# Patient Record
Sex: Male | Born: 1940 | Race: White | Hispanic: No | Marital: Married | Smoking: Former smoker
Health system: Southern US, Community
[De-identification: ages and names within clinical notes are randomized; demographics above are authoritative.]

## PROBLEM LIST (undated history)

## (undated) DIAGNOSIS — E785 Hyperlipidemia, unspecified: Secondary | ICD-10-CM

## (undated) DIAGNOSIS — J189 Pneumonia, unspecified organism: Secondary | ICD-10-CM

## (undated) DIAGNOSIS — I10 Essential (primary) hypertension: Secondary | ICD-10-CM

## (undated) DIAGNOSIS — D369 Benign neoplasm, unspecified site: Secondary | ICD-10-CM

## (undated) DIAGNOSIS — K5790 Diverticulosis of intestine, part unspecified, without perforation or abscess without bleeding: Secondary | ICD-10-CM

## (undated) DIAGNOSIS — I219 Acute myocardial infarction, unspecified: Secondary | ICD-10-CM

## (undated) DIAGNOSIS — K5732 Diverticulitis of large intestine without perforation or abscess without bleeding: Secondary | ICD-10-CM

## (undated) DIAGNOSIS — I5022 Chronic systolic (congestive) heart failure: Secondary | ICD-10-CM

## (undated) DIAGNOSIS — E875 Hyperkalemia: Secondary | ICD-10-CM

## (undated) DIAGNOSIS — J449 Chronic obstructive pulmonary disease, unspecified: Secondary | ICD-10-CM

## (undated) DIAGNOSIS — Z8679 Personal history of other diseases of the circulatory system: Secondary | ICD-10-CM

## (undated) DIAGNOSIS — I2510 Atherosclerotic heart disease of native coronary artery without angina pectoris: Secondary | ICD-10-CM

## (undated) DIAGNOSIS — H9190 Unspecified hearing loss, unspecified ear: Secondary | ICD-10-CM

## (undated) DIAGNOSIS — I482 Chronic atrial fibrillation, unspecified: Secondary | ICD-10-CM

## (undated) DIAGNOSIS — E119 Type 2 diabetes mellitus without complications: Secondary | ICD-10-CM

## (undated) DIAGNOSIS — C61 Malignant neoplasm of prostate: Secondary | ICD-10-CM

## (undated) HISTORY — DX: Chronic obstructive pulmonary disease, unspecified: J44.9

## (undated) HISTORY — DX: Chronic systolic (congestive) heart failure: I50.22

## (undated) HISTORY — DX: Diverticulitis of large intestine without perforation or abscess without bleeding: K57.32

## (undated) HISTORY — DX: Atherosclerotic heart disease of native coronary artery without angina pectoris: I25.10

## (undated) HISTORY — DX: Unspecified hearing loss, unspecified ear: H91.90

## (undated) HISTORY — DX: Benign neoplasm, unspecified site: D36.9

## (undated) HISTORY — PX: EXTERNAL EAR SURGERY: SHX627

## (undated) HISTORY — DX: Essential (primary) hypertension: I10

## (undated) HISTORY — DX: Type 2 diabetes mellitus without complications: E11.9

## (undated) HISTORY — DX: Pneumonia, unspecified organism: J18.9

## (undated) HISTORY — DX: Acute myocardial infarction, unspecified: I21.9

## (undated) HISTORY — DX: Hyperkalemia: E87.5

## (undated) HISTORY — DX: Diverticulosis of intestine, part unspecified, without perforation or abscess without bleeding: K57.90

## (undated) HISTORY — PX: HERNIA REPAIR: SHX51

## (undated) HISTORY — DX: Personal history of other diseases of the circulatory system: Z86.79

## (undated) HISTORY — PX: CATARACT EXTRACTION: SUR2

## (undated) HISTORY — DX: Hyperlipidemia, unspecified: E78.5

## (undated) HISTORY — DX: Chronic atrial fibrillation, unspecified: I48.20

---

## 1987-12-23 HISTORY — PX: CORONARY ARTERY BYPASS GRAFT: SHX141

## 2001-07-09 ENCOUNTER — Inpatient Hospital Stay (HOSPITAL_COMMUNITY): Admission: EM | Admit: 2001-07-09 | Discharge: 2001-07-13 | Payer: Self-pay

## 2001-07-23 ENCOUNTER — Encounter: Payer: Self-pay | Admitting: Internal Medicine

## 2001-07-23 ENCOUNTER — Ambulatory Visit (HOSPITAL_COMMUNITY): Admission: RE | Admit: 2001-07-23 | Discharge: 2001-07-23 | Payer: Self-pay | Admitting: Internal Medicine

## 2004-07-13 ENCOUNTER — Other Ambulatory Visit: Payer: Self-pay

## 2004-11-04 ENCOUNTER — Ambulatory Visit: Payer: Self-pay | Admitting: Cardiology

## 2004-11-18 ENCOUNTER — Ambulatory Visit: Payer: Self-pay

## 2004-11-18 ENCOUNTER — Ambulatory Visit: Payer: Self-pay | Admitting: Cardiology

## 2004-11-26 ENCOUNTER — Ambulatory Visit: Payer: Self-pay

## 2004-11-27 ENCOUNTER — Ambulatory Visit (HOSPITAL_COMMUNITY): Admission: RE | Admit: 2004-11-27 | Discharge: 2004-11-27 | Payer: Self-pay | Admitting: Cardiology

## 2004-11-27 ENCOUNTER — Ambulatory Visit: Payer: Self-pay | Admitting: Cardiology

## 2004-12-03 ENCOUNTER — Ambulatory Visit: Payer: Self-pay | Admitting: Cardiology

## 2005-11-05 ENCOUNTER — Ambulatory Visit: Payer: Self-pay | Admitting: Cardiology

## 2005-11-20 ENCOUNTER — Encounter: Payer: Self-pay | Admitting: Cardiology

## 2005-11-20 ENCOUNTER — Ambulatory Visit: Payer: Self-pay

## 2005-12-17 ENCOUNTER — Ambulatory Visit: Payer: Self-pay | Admitting: Cardiology

## 2005-12-22 HISTORY — PX: MITRAL VALVE REPLACEMENT: SHX147

## 2006-03-15 ENCOUNTER — Inpatient Hospital Stay (HOSPITAL_COMMUNITY): Admission: EM | Admit: 2006-03-15 | Discharge: 2006-03-20 | Payer: Self-pay | Admitting: Emergency Medicine

## 2006-03-15 ENCOUNTER — Ambulatory Visit: Payer: Self-pay | Admitting: Cardiology

## 2006-03-16 ENCOUNTER — Encounter: Payer: Self-pay | Admitting: Cardiology

## 2006-03-17 ENCOUNTER — Encounter: Payer: Self-pay | Admitting: Cardiology

## 2006-03-23 ENCOUNTER — Ambulatory Visit: Payer: Self-pay | Admitting: Cardiology

## 2006-03-31 ENCOUNTER — Ambulatory Visit: Payer: Self-pay | Admitting: Cardiology

## 2006-04-07 ENCOUNTER — Ambulatory Visit: Payer: Self-pay | Admitting: Cardiology

## 2006-04-14 ENCOUNTER — Ambulatory Visit: Payer: Self-pay | Admitting: Cardiovascular Disease

## 2006-05-11 ENCOUNTER — Ambulatory Visit: Payer: Self-pay | Admitting: Internal Medicine

## 2006-05-19 ENCOUNTER — Ambulatory Visit: Payer: Self-pay | Admitting: Cardiology

## 2006-06-01 ENCOUNTER — Ambulatory Visit: Payer: Self-pay | Admitting: Cardiology

## 2006-06-19 ENCOUNTER — Ambulatory Visit: Payer: Self-pay | Admitting: Cardiology

## 2006-07-17 ENCOUNTER — Ambulatory Visit: Payer: Self-pay | Admitting: Cardiology

## 2006-08-07 ENCOUNTER — Ambulatory Visit: Payer: Self-pay | Admitting: Internal Medicine

## 2006-09-07 ENCOUNTER — Ambulatory Visit: Payer: Self-pay | Admitting: Cardiology

## 2006-09-07 ENCOUNTER — Ambulatory Visit: Payer: Self-pay

## 2006-09-07 ENCOUNTER — Encounter: Payer: Self-pay | Admitting: Cardiovascular Disease

## 2006-09-11 ENCOUNTER — Ambulatory Visit: Payer: Self-pay | Admitting: Cardiology

## 2006-10-05 ENCOUNTER — Ambulatory Visit: Payer: Self-pay | Admitting: Cardiology

## 2006-11-02 ENCOUNTER — Ambulatory Visit: Payer: Self-pay | Admitting: Cardiovascular Disease

## 2006-11-30 ENCOUNTER — Ambulatory Visit: Payer: Self-pay | Admitting: Cardiology

## 2006-12-28 ENCOUNTER — Ambulatory Visit: Payer: Self-pay | Admitting: Cardiology

## 2007-01-25 ENCOUNTER — Ambulatory Visit: Payer: Self-pay | Admitting: Cardiology

## 2007-02-22 ENCOUNTER — Ambulatory Visit: Payer: Self-pay | Admitting: Cardiovascular Disease

## 2007-03-23 ENCOUNTER — Ambulatory Visit: Payer: Self-pay | Admitting: *Deleted

## 2007-03-24 ENCOUNTER — Ambulatory Visit: Payer: Self-pay | Admitting: Cardiology

## 2007-04-22 ENCOUNTER — Ambulatory Visit: Payer: Self-pay | Admitting: Cardiovascular Disease

## 2007-05-10 ENCOUNTER — Ambulatory Visit: Payer: Self-pay | Admitting: Internal Medicine

## 2007-06-07 ENCOUNTER — Ambulatory Visit: Payer: Self-pay | Admitting: Cardiology

## 2007-07-05 ENCOUNTER — Ambulatory Visit: Payer: Self-pay | Admitting: Internal Medicine

## 2007-08-02 ENCOUNTER — Ambulatory Visit: Payer: Self-pay | Admitting: Internal Medicine

## 2007-08-30 ENCOUNTER — Ambulatory Visit: Payer: Self-pay | Admitting: Cardiology

## 2007-09-27 ENCOUNTER — Ambulatory Visit: Payer: Self-pay | Admitting: Cardiology

## 2007-10-25 ENCOUNTER — Ambulatory Visit: Payer: Self-pay | Admitting: Cardiology

## 2007-11-22 ENCOUNTER — Ambulatory Visit: Payer: Self-pay | Admitting: Cardiology

## 2007-12-27 ENCOUNTER — Ambulatory Visit: Payer: Self-pay | Admitting: Internal Medicine

## 2008-01-10 ENCOUNTER — Ambulatory Visit: Payer: Self-pay | Admitting: Cardiovascular Disease

## 2008-02-07 ENCOUNTER — Ambulatory Visit: Payer: Self-pay | Admitting: Internal Medicine

## 2008-02-09 ENCOUNTER — Ambulatory Visit: Payer: Self-pay | Admitting: Cardiology

## 2008-03-06 ENCOUNTER — Ambulatory Visit: Payer: Self-pay | Admitting: Cardiology

## 2008-03-06 LAB — CONVERTED CEMR LAB
ALT: 31 units/L (ref 0–53)
AST: 26 units/L (ref 0–37)
Bilirubin, Direct: 0.1 mg/dL (ref 0.0–0.3)
Cholesterol: 143 mg/dL (ref 0–200)
Indirect Bilirubin: 0.4 mg/dL (ref 0.0–0.9)
Total CHOL/HDL Ratio: 4.8

## 2008-03-21 ENCOUNTER — Ambulatory Visit: Payer: Self-pay | Admitting: Cardiology

## 2008-03-21 LAB — CONVERTED CEMR LAB
BUN: 24 mg/dL — ABNORMAL HIGH (ref 6–23)
Calcium: 9 mg/dL (ref 8.4–10.5)
Creatinine, Ser: 1.09 mg/dL (ref 0.40–1.50)
Glucose, Bld: 212 mg/dL — ABNORMAL HIGH (ref 70–99)

## 2008-03-27 ENCOUNTER — Ambulatory Visit: Payer: Self-pay | Admitting: Cardiology

## 2008-03-28 ENCOUNTER — Encounter: Payer: Self-pay | Admitting: Cardiology

## 2008-03-28 ENCOUNTER — Ambulatory Visit: Payer: Self-pay

## 2008-04-10 ENCOUNTER — Ambulatory Visit: Payer: Self-pay | Admitting: Cardiology

## 2008-05-08 ENCOUNTER — Ambulatory Visit: Payer: Self-pay | Admitting: Cardiology

## 2008-06-12 ENCOUNTER — Ambulatory Visit: Payer: Self-pay | Admitting: Cardiology

## 2008-07-03 ENCOUNTER — Ambulatory Visit: Payer: Self-pay | Admitting: Cardiology

## 2008-07-17 ENCOUNTER — Ambulatory Visit: Payer: Self-pay | Admitting: Cardiology

## 2008-07-31 ENCOUNTER — Ambulatory Visit: Payer: Self-pay | Admitting: Cardiology

## 2008-09-05 ENCOUNTER — Ambulatory Visit: Payer: Self-pay | Admitting: Cardiology

## 2008-10-03 ENCOUNTER — Ambulatory Visit: Payer: Self-pay | Admitting: Cardiology

## 2008-10-16 ENCOUNTER — Ambulatory Visit: Payer: Self-pay | Admitting: Cardiology

## 2008-10-17 ENCOUNTER — Encounter: Payer: Self-pay | Admitting: Internal Medicine

## 2008-10-20 ENCOUNTER — Encounter: Payer: Self-pay | Admitting: Internal Medicine

## 2008-10-30 ENCOUNTER — Emergency Department: Payer: Self-pay | Admitting: Emergency Medicine

## 2008-11-15 ENCOUNTER — Ambulatory Visit: Payer: Self-pay | Admitting: Internal Medicine

## 2008-11-23 ENCOUNTER — Encounter: Payer: Self-pay | Admitting: Internal Medicine

## 2008-11-23 ENCOUNTER — Ambulatory Visit: Payer: Self-pay | Admitting: Internal Medicine

## 2008-11-23 HISTORY — PX: COLONOSCOPY: SHX174

## 2008-11-30 ENCOUNTER — Encounter: Payer: Self-pay | Admitting: Internal Medicine

## 2008-12-04 ENCOUNTER — Ambulatory Visit: Payer: Self-pay | Admitting: Internal Medicine

## 2008-12-25 ENCOUNTER — Encounter: Payer: Self-pay | Admitting: Cardiovascular Disease

## 2008-12-25 ENCOUNTER — Ambulatory Visit: Payer: Self-pay | Admitting: Internal Medicine

## 2008-12-25 LAB — CONVERTED CEMR LAB: Prothrombin Time: 25 s — ABNORMAL HIGH (ref 11.6–15.2)

## 2009-01-04 ENCOUNTER — Ambulatory Visit: Payer: Self-pay | Admitting: Internal Medicine

## 2009-01-08 ENCOUNTER — Ambulatory Visit: Payer: Self-pay | Admitting: Internal Medicine

## 2009-01-30 ENCOUNTER — Ambulatory Visit: Payer: Self-pay

## 2009-02-01 ENCOUNTER — Ambulatory Visit (HOSPITAL_COMMUNITY): Admission: RE | Admit: 2009-02-01 | Discharge: 2009-02-01 | Payer: Self-pay | Admitting: Urology

## 2009-02-12 ENCOUNTER — Ambulatory Visit: Payer: Self-pay | Admitting: Cardiology

## 2009-02-20 ENCOUNTER — Ambulatory Visit: Admission: RE | Admit: 2009-02-20 | Discharge: 2009-05-21 | Payer: Self-pay | Admitting: Radiation Oncology

## 2009-03-13 ENCOUNTER — Ambulatory Visit: Payer: Self-pay | Admitting: Cardiology

## 2009-03-22 ENCOUNTER — Ambulatory Visit: Payer: Self-pay | Admitting: Cardiology

## 2009-04-05 ENCOUNTER — Ambulatory Visit: Payer: Self-pay | Admitting: Cardiology

## 2009-04-21 DIAGNOSIS — C61 Malignant neoplasm of prostate: Secondary | ICD-10-CM

## 2009-04-21 HISTORY — DX: Malignant neoplasm of prostate: C61

## 2009-04-23 ENCOUNTER — Encounter: Payer: Self-pay | Admitting: Cardiology

## 2009-04-25 ENCOUNTER — Encounter (INDEPENDENT_AMBULATORY_CARE_PROVIDER_SITE_OTHER): Payer: Self-pay | Admitting: *Deleted

## 2009-05-01 DIAGNOSIS — E875 Hyperkalemia: Secondary | ICD-10-CM | POA: Insufficient documentation

## 2009-05-01 DIAGNOSIS — I25810 Atherosclerosis of coronary artery bypass graft(s) without angina pectoris: Secondary | ICD-10-CM | POA: Insufficient documentation

## 2009-05-01 DIAGNOSIS — I4891 Unspecified atrial fibrillation: Secondary | ICD-10-CM

## 2009-05-01 DIAGNOSIS — I10 Essential (primary) hypertension: Secondary | ICD-10-CM | POA: Insufficient documentation

## 2009-05-01 DIAGNOSIS — I5022 Chronic systolic (congestive) heart failure: Secondary | ICD-10-CM | POA: Insufficient documentation

## 2009-05-03 ENCOUNTER — Ambulatory Visit: Payer: Self-pay | Admitting: Internal Medicine

## 2009-05-25 ENCOUNTER — Telehealth: Payer: Self-pay | Admitting: Cardiology

## 2009-05-25 ENCOUNTER — Ambulatory Visit (HOSPITAL_BASED_OUTPATIENT_CLINIC_OR_DEPARTMENT_OTHER): Admission: RE | Admit: 2009-05-25 | Discharge: 2009-05-25 | Payer: Self-pay | Admitting: Urology

## 2009-05-29 ENCOUNTER — Ambulatory Visit: Payer: Self-pay | Admitting: Cardiology

## 2009-06-01 ENCOUNTER — Ambulatory Visit: Payer: Self-pay | Admitting: Internal Medicine

## 2009-06-19 ENCOUNTER — Ambulatory Visit: Payer: Self-pay | Admitting: Cardiology

## 2009-06-21 ENCOUNTER — Ambulatory Visit: Admission: RE | Admit: 2009-06-21 | Discharge: 2009-07-24 | Payer: Self-pay | Admitting: Radiation Oncology

## 2009-07-10 ENCOUNTER — Ambulatory Visit: Payer: Self-pay | Admitting: Cardiology

## 2009-08-06 ENCOUNTER — Encounter: Payer: Self-pay | Admitting: *Deleted

## 2009-08-07 ENCOUNTER — Ambulatory Visit: Payer: Self-pay | Admitting: Cardiology

## 2009-09-04 ENCOUNTER — Ambulatory Visit: Payer: Self-pay | Admitting: Cardiology

## 2009-09-04 LAB — CONVERTED CEMR LAB: POC INR: 2.8

## 2009-10-02 ENCOUNTER — Ambulatory Visit: Payer: Self-pay | Admitting: Internal Medicine

## 2009-10-02 LAB — CONVERTED CEMR LAB: POC INR: 2.1

## 2009-10-30 ENCOUNTER — Ambulatory Visit: Payer: Self-pay | Admitting: Cardiovascular Disease

## 2009-11-27 ENCOUNTER — Ambulatory Visit: Payer: Self-pay | Admitting: Cardiology

## 2009-11-27 LAB — CONVERTED CEMR LAB: POC INR: 2.6

## 2009-12-25 ENCOUNTER — Ambulatory Visit: Payer: Self-pay | Admitting: Cardiology

## 2009-12-25 LAB — CONVERTED CEMR LAB: POC INR: 2.1

## 2010-01-22 ENCOUNTER — Ambulatory Visit: Payer: Self-pay | Admitting: Cardiovascular Disease

## 2010-01-25 ENCOUNTER — Ambulatory Visit: Payer: Self-pay | Admitting: Cardiovascular Disease

## 2010-01-25 DIAGNOSIS — E118 Type 2 diabetes mellitus with unspecified complications: Secondary | ICD-10-CM | POA: Insufficient documentation

## 2010-01-25 DIAGNOSIS — E785 Hyperlipidemia, unspecified: Secondary | ICD-10-CM

## 2010-02-19 ENCOUNTER — Ambulatory Visit: Payer: Self-pay | Admitting: Cardiovascular Disease

## 2010-02-19 LAB — CONVERTED CEMR LAB: POC INR: 2.2

## 2010-03-12 ENCOUNTER — Telehealth: Payer: Self-pay | Admitting: Cardiovascular Disease

## 2010-03-19 ENCOUNTER — Ambulatory Visit: Payer: Self-pay | Admitting: Cardiovascular Disease

## 2010-03-19 LAB — CONVERTED CEMR LAB: POC INR: 2.3

## 2010-03-22 DIAGNOSIS — J189 Pneumonia, unspecified organism: Secondary | ICD-10-CM

## 2010-03-22 HISTORY — DX: Pneumonia, unspecified organism: J18.9

## 2010-03-26 ENCOUNTER — Inpatient Hospital Stay: Payer: Medicare Other | Admitting: Internal Medicine

## 2010-03-26 ENCOUNTER — Ambulatory Visit: Payer: Self-pay | Admitting: Cardiovascular Disease

## 2010-03-28 ENCOUNTER — Encounter: Payer: Self-pay | Admitting: Cardiology

## 2010-04-05 ENCOUNTER — Ambulatory Visit: Payer: Self-pay | Admitting: Cardiology

## 2010-04-19 ENCOUNTER — Ambulatory Visit: Payer: Self-pay | Admitting: Cardiology

## 2010-04-19 LAB — CONVERTED CEMR LAB: POC INR: 2.5

## 2010-04-29 ENCOUNTER — Encounter: Payer: Self-pay | Admitting: Cardiovascular Disease

## 2010-04-29 ENCOUNTER — Ambulatory Visit: Payer: Self-pay | Admitting: Cardiovascular Disease

## 2010-04-29 LAB — CONVERTED CEMR LAB: POC INR: 3.9

## 2010-04-30 LAB — CONVERTED CEMR LAB
BUN: 25 mg/dL — ABNORMAL HIGH (ref 6–23)
CO2: 27 meq/L (ref 19–32)
Calcium: 8.8 mg/dL (ref 8.4–10.5)
Creatinine, Ser: 0.98 mg/dL (ref 0.40–1.50)

## 2010-05-07 ENCOUNTER — Other Ambulatory Visit: Payer: Medicare Other | Admitting: Ophthalmology

## 2010-05-15 ENCOUNTER — Ambulatory Visit: Payer: Medicare Other | Admitting: Ophthalmology

## 2010-05-21 ENCOUNTER — Ambulatory Visit: Payer: Self-pay | Admitting: Cardiovascular Disease

## 2010-06-20 ENCOUNTER — Ambulatory Visit: Payer: Self-pay | Admitting: Cardiovascular Disease

## 2010-07-17 ENCOUNTER — Ambulatory Visit: Payer: Self-pay | Admitting: Cardiovascular Disease

## 2010-07-17 LAB — CONVERTED CEMR LAB: POC INR: 4.7

## 2010-07-29 ENCOUNTER — Ambulatory Visit: Payer: Self-pay | Admitting: Cardiovascular Disease

## 2010-07-29 ENCOUNTER — Encounter: Payer: Self-pay | Admitting: Cardiovascular Disease

## 2010-07-29 DIAGNOSIS — Z9889 Other specified postprocedural states: Secondary | ICD-10-CM

## 2010-07-30 ENCOUNTER — Encounter: Payer: Self-pay | Admitting: Cardiovascular Disease

## 2010-07-31 LAB — CONVERTED CEMR LAB
Alkaline Phosphatase: 39 units/L (ref 39–117)
BUN: 16 mg/dL (ref 6–23)
Bilirubin, Direct: 0.1 mg/dL (ref 0.0–0.3)
CO2: 27 meq/L (ref 19–32)
Chloride: 104 meq/L (ref 96–112)
Creatinine, Ser: 0.96 mg/dL (ref 0.40–1.50)
Glucose, Bld: 106 mg/dL — ABNORMAL HIGH (ref 70–99)
Indirect Bilirubin: 0.3 mg/dL (ref 0.0–0.9)
LDL Cholesterol: 71 mg/dL (ref 0–99)
Total Bilirubin: 0.4 mg/dL (ref 0.3–1.2)
Triglycerides: 310 mg/dL — ABNORMAL HIGH (ref <150)
VLDL: 62 mg/dL — ABNORMAL HIGH (ref 0–40)

## 2010-08-07 ENCOUNTER — Ambulatory Visit: Payer: Self-pay | Admitting: Cardiovascular Disease

## 2010-08-28 ENCOUNTER — Ambulatory Visit: Payer: Self-pay | Admitting: Cardiovascular Disease

## 2010-08-28 LAB — CONVERTED CEMR LAB: POC INR: 4.2

## 2010-09-18 ENCOUNTER — Ambulatory Visit: Payer: Self-pay | Admitting: Cardiology

## 2010-09-18 LAB — CONVERTED CEMR LAB: POC INR: 3.7

## 2010-10-21 ENCOUNTER — Ambulatory Visit: Payer: Self-pay | Admitting: Cardiovascular Disease

## 2010-10-21 LAB — CONVERTED CEMR LAB: POC INR: 2.9

## 2010-11-20 ENCOUNTER — Ambulatory Visit: Payer: Self-pay | Admitting: Cardiovascular Disease

## 2010-11-20 LAB — CONVERTED CEMR LAB: POC INR: 2.4

## 2010-12-18 ENCOUNTER — Ambulatory Visit: Payer: Self-pay | Admitting: Cardiovascular Disease

## 2010-12-18 LAB — CONVERTED CEMR LAB: POC INR: 3.6

## 2011-01-02 ENCOUNTER — Other Ambulatory Visit: Payer: Self-pay | Admitting: Cardiovascular Disease

## 2011-01-02 ENCOUNTER — Ambulatory Visit: Admission: RE | Admit: 2011-01-02 | Discharge: 2011-01-02 | Payer: Self-pay | Source: Home / Self Care

## 2011-01-15 ENCOUNTER — Ambulatory Visit: Admission: RE | Admit: 2011-01-15 | Discharge: 2011-01-15 | Payer: Self-pay | Source: Home / Self Care

## 2011-01-15 LAB — CONVERTED CEMR LAB: POC INR: 3.7

## 2011-01-19 LAB — CONVERTED CEMR LAB
ALT: 30 units/L
AST: 27 units/L
Albumin: 4 g/dL
BUN: 21 mg/dL
Creatinine, Ser: 1 mg/dL
Glomerular Filtration Rate, Af Am: >60 mL/min/{1.73_m2}

## 2011-01-21 NOTE — Medication Information (Signed)
Summary: CCR/AMD   Anticoagulant Therapy  Managed by: Robyn Haber, RN, BSN Referring MD: Valera Castle Supervising MD: Mariah Milling Indication 1: Atrial Fibrillation (ICD-427.31) Indication 2: Mitral Valve Replacement (ICD-V43.3) Lab Used: [facility] Site: [place] INR POC 2.3 INR RANGE 2.5-3.5  Dietary changes: no    Health status changes: no    Bleeding/hemorrhagic complications: no    Recent/future hospitalizations: no    Any changes in medication regimen? no    Recent/future dental: no  Any missed doses?: no       Is patient compliant with meds? yes       Allergies: 1)  ! Sulfa 2)  ! Codeine 3)  ! * Tylenol 3  Anticoagulation Management History:      The patient is taking warfarin and comes in today for a routine follow up visit.  Positive risk factors for bleeding include an age of 72 years or older and presence of serious comorbidities.  The bleeding index is 'intermediate risk'.  Positive CHADS2 values include History of CHF, History of HTN, and History of Diabetes.  Negative CHADS2 values include Age > 50 years old.  The start date was 03/16/2006.  His last INR was 2.0.  Anticoagulation responsible provider: Consuello Lassalle.  INR POC: 2.3.    Anticoagulation Management Assessment/Plan:      The patient's current anticoagulation dose is Warfarin sodium 2.5 mg tabs: Use as directed by Anticoagualtion Clinic.  The target INR is 2.5 - 3.5.  The next INR is due 04/02/2010.  Anticoagulation instructions were given to patient.  Results were reviewed/authorized by Robyn Haber, RN, BSN.  He was notified by Charlena Cross, RN, BSN.         Prior Anticoagulation Instructions: coumadin 5 mg today then coumadin 2.5 mg dailyThe patient is to continue with the same dose of coumadin.  This dosage includes:   Current Anticoagulation Instructions: coumadin 2.5 mg daily with 3.125 mg on Tues

## 2011-01-21 NOTE — Progress Notes (Signed)
Summary: [facility]   Imported By: Harlon Flor 01/25/2010 12:03:18  _____________________________________________________________________  External Attachment:    Type:   Image     Comment:   External Document

## 2011-01-21 NOTE — Medication Information (Signed)
Summary: 8:30 APPT/AMD   Anticoagulant Therapy  Managed by: Billy Reams, [name], [name] Referring MD: Valera Castle PCP: Dr. Marjo Bicker Supervising MD: Mariah Milling Indication 1: Atrial Fibrillation (ICD-427.31) Indication 2: Mitral Valve Replacement (ICD-V43.3) Lab Used: [facility] Site: [place] INR POC 2.9 INR RANGE 2.5-3.5  Dietary changes: no     Bleeding/hemorrhagic complications: no     Any changes in medication regimen? yes       Details: Started levaquin 2 days left   Any missed doses?: no       Is patient compliant with meds? yes       Allergies: 1)  ! Sulfa 2)  ! Codeine 3)  ! * Tylenol 3  Anticoagulation Management History:      The patient is taking warfarin and comes in today for a routine follow up visit.  Positive risk factors for bleeding include an age of 30 years or older and presence of serious comorbidities.  The bleeding index is 'intermediate risk'.  Positive CHADS2 values include History of CHF, History of HTN, and History of Diabetes.  Negative CHADS2 values include Age > 14 years old.  The start date was 03/16/2006.  His last INR was 2.0.  Anticoagulation responsible Daysi Boggan: [facility].  INR POC: 2.9.  Exp: 10/2011.    Anticoagulation Management Assessment/Plan:      The patient's current anticoagulation dose is Warfarin sodium 2.5 mg tabs: Use as directed by Anticoagualtion Clinic.  The target INR is 2.5 - 3.5.  The next INR is due 11/20/2010.  Anticoagulation instructions were given to patient.  Results were reviewed/authorized by Billy Reams, [name], [name].  He was notified by Benedict Needy, [name].         Prior Anticoagulation Instructions: INR 3.7  Start taking 1 tablet daily except 1.5 tablets on Mondays.  Recheck in 3-4 weeks.    Current Anticoagulation Instructions: INR 2.9  Continue taking 1 tab daily except for 1.5 tabs on Monday. Recheck in 4 weeks.

## 2011-01-21 NOTE — Medication Information (Signed)
Summary: CCR/AMD  Anticoagulant Therapy  Managed by: Cloyde Reams, RN, BSN Referring MD: Valera Castle PCP: Dr. Marjo Bicker Supervising MD: Mariah Milling Indication 1: Atrial Fibrillation (ICD-427.31) Indication 2: Mitral Valve Replacement (ICD-V43.3) Lab Used: [facility] Site: [place] INR POC 4.2 INR RANGE 2.5-3.5  Dietary changes: yes       Details: Decr amt of vit K in diet, pt has been out of town.  Health status changes: no    Bleeding/hemorrhagic complications: no    Recent/future hospitalizations: no    Any changes in medication regimen? no    Recent/future dental: no  Any missed doses?: no       Is patient compliant with meds? yes       Allergies: 1)  ! Sulfa 2)  ! Codeine 3)  ! * Tylenol 3  Anticoagulation Management History:      The patient is taking warfarin and comes in today for a routine follow up visit.  Positive risk factors for bleeding include an age of 70 years or older and presence of serious comorbidities.  The bleeding index is 'intermediate risk'.  Positive CHADS2 values include History of CHF, History of HTN, and History of Diabetes.  Negative CHADS2 values include Age > 65 years old.  The start date was 03/16/2006.  His last INR was 2.0.  Anticoagulation responsible provider: [facility].  INR POC: 4.2.  Cuvette Lot#: [id number].  Exp: 09/2011.    Anticoagulation Management Assessment/Plan:      The patient's current anticoagulation dose is Warfarin sodium 2.5 mg tabs: Use as directed by Anticoagualtion Clinic.  The target INR is 2.5 - 3.5.  The next INR is due 09/18/2010.  Anticoagulation instructions were given to patient.  Results were reviewed/authorized by Cloyde Reams, RN, BSN.  He was notified by Cloyde Reams RN.         Prior Anticoagulation Instructions: INR 2.9   Continue taking 1 tab daily except for 2 tabs on Mondays. Recheck in 3 weeks.   Current Anticoagulation Instructions: INR 4.2  Skip today's dosage  then resume 1 tablet daily except 2 tablets on Mondays.  Recheck in 3 weeks.

## 2011-01-21 NOTE — Medication Information (Signed)
Summary: CCR/AMD   Anticoagulant Therapy  Managed by: Robyn Haber, RN, BSN Referring MD: Valera Castle Supervising MD: Mariah Milling Indication 1: Atrial Fibrillation (ICD-427.31) Indication 2: Mitral Valve Replacement (ICD-V43.3) Lab Used: [facility] Site: [place] INR POC 2.2 INR RANGE 2.5-3.5  Dietary changes: no    Health status changes: no    Bleeding/hemorrhagic complications: no    Recent/future hospitalizations: no    Any changes in medication regimen? no    Recent/future dental: no  Any missed doses?: no       Is patient compliant with meds? yes       Allergies: 1)  ! Sulfa 2)  ! Codeine 3)  ! * Tylenol 3  Anticoagulation Management History:      The patient is taking warfarin and comes in today for a routine follow up visit.  Positive risk factors for bleeding include an age of 70 years or older.  The bleeding index is 'intermediate risk'.  Positive CHADS2 values include History of CHF and History of HTN.  Negative CHADS2 values include Age > 30 years old.  The start date was 03/16/2006.  His last INR was 2.0.  Anticoagulation responsible provider: gollan.  INR POC: 2.2.    Anticoagulation Management Assessment/Plan:      The patient's current anticoagulation dose is Warfarin sodium 2.5 mg tabs: Use as directed by Anticoagualtion Clinic.  The target INR is 2.5 - 3.5.  The next INR is due 02/19/2010.  Anticoagulation instructions were given to patient.  Results were reviewed/authorized by Robyn Haber, RN, BSN.  He was notified by Charlena Cross, RN, BSN.         Prior Anticoagulation Instructions: coumadin 5 mg today then resume coumadin 2.5 mg daily   Current Anticoagulation Instructions: coumadin 5 mg today then resume coumadin 2.5 mg daily

## 2011-01-21 NOTE — Assessment & Plan Note (Signed)
Summary: EPH/AMD   CC:  [facility]  History of Present Illness: [name] is a very pleasant 70 year old gentleman with a past medical history of coronary artery disease, bypass surgery, mitral valve replacement with prosthetic valve is on Coumadin, history of chronic atrial fibrillation, diabetes, hypertension, hyperlipidemia who presents for routine followup After being discharged from [facility] on April 7 for pneumonia.  overall he states that he feels significantly better. His pneumonia has resolved and he has no cough or shortness of breath. He does take Lasix daily and is concerned that he may becoming dehydrated. He feels that he is back to normal with no other complaints.  [name] had his last stress test in 2009, that showed ejection fraction 43%, inferior wall infarct with no ischemia. MV was functioning well.   His last echocardiogram was in April 2009 showed ejection fraction 45-50%.  Problems Prior to Update: 1)  Diab w/o Comp Type Ii/uns Not Stated Uncntrl  (ICD-250.00) 2)  Hyperlipidemia-mixed  (ICD-272.4) 3)  Atrial Fibrillation  (ICD-427.31) 4)  Hypertension, Unspecified  (ICD-401.9) 5)  Hyperkalemia  (ICD-276.7) 6)  Systolic Heart Failure, Acute  (ICD-428.21) 7)  Cad, Artery Bypass Graft  (ICD-414.04)  Medications Prior to Update: 1)  Metoprolol Succinate 50 Mg Xr24h-Tab (Metoprolol Succinate) .... Take One Tablet By Mouth Daily 2)  Simvastatin 40 Mg Tabs (Simvastatin) .... Take One Tablet By Mouth Daily At Bedtime 3)  Aspirin 81 Mg Tbec (Aspirin) .... Take One Tablet By Mouth Daily 4)  Furosemide 40 Mg Tabs (Furosemide) .... Take One Tablet By Mouth Daily. 5)  Losartan Potassium 100 Mg Tabs (Losartan Potassium) .Marland Kitchen.. 1 By Mouth Once Daily 6)  Digoxin 0.125 Mg Tabs (Digoxin) .... Take One Tablet By Mouth Daily 7)  Nitroglycerin 0.4 Mg Subl (Nitroglycerin) .... One Tablet Under Tongue Every 5 Minutes As Needed For Chest Pain---May  Repeat Times Three 8)  Acetaminophen 500 Mg  Caps (Acetaminophen) .... As Needed 9)  Warfarin Sodium 2.5 Mg Tabs (Warfarin Sodium) .... Use As Directed By Anticoagualtion Clinic 10)  Vitamin D3 1000 Unit Caps (Cholecalciferol) .Marland Kitchen.. 1 Tab By Mouth Once Daily 11)  Indomethacin 25 Mg Caps (Indomethacin) .Marland Kitchen.. 1 Tab Three Times A Day For 3 More Days  Current Medications (verified): 1)  Metoprolol Succinate 50 Mg Xr24h-Tab (Metoprolol Succinate) .... Take One Tablet By Mouth Daily 2)  Simvastatin 40 Mg Tabs (Simvastatin) .... Take One Tablet By Mouth Daily At Bedtime 3)  Aspirin 81 Mg Tbec (Aspirin) .... Take One Tablet By Mouth Daily 4)  Furosemide 40 Mg Tabs (Furosemide) .... Take One Tablet By Mouth Daily. 5)  Losartan Potassium 100 Mg Tabs (Losartan Potassium) .Marland Kitchen.. 1 By Mouth Once Daily 6)  Digoxin 0.125 Mg Tabs (Digoxin) .... Take One Tablet By Mouth Daily 7)  Nitroglycerin 0.4 Mg Subl (Nitroglycerin) .... One Tablet Under Tongue Every 5 Minutes As Needed For Chest Pain---May Repeat Times Three 8)  Acetaminophen 500 Mg  Caps (Acetaminophen) .... As Needed 9)  Warfarin Sodium 2.5 Mg Tabs (Warfarin Sodium) .... Use As Directed By Anticoagualtion Clinic 10)  Vitamin D3 1000 Unit Caps (Cholecalciferol) .Marland Kitchen.. 1 Tab By Mouth Once Daily 11)  Uloric 40 Mg Tabs (Febuxostat) .... Take 1 By Mouth Once Daily  Allergies: 1)  ! Sulfa 2)  ! Codeine 3)  ! * Tylenol 3  Past History:  Past Medical History: Last updated: 05/01/2009 ATRIAL FIBRILLATION (ICD-427.31) HYPERTENSION, UNSPECIFIED (ICD-401.9) HYPERKALEMIA (ICD-276.7) SYSTOLIC HEART FAILURE, ACUTE (ICD-428.21) CAD, ARTERY  BYPASS GRAFT (ICD-414.04)    Family History: Last updated: 05/01/2009 Family History of Coronary Artery Disease:  Family History of CVA or Stroke:   Social History: Last updated: 05/01/2009 Tobacco Use - Former. - 1989 Drug Use - no Retired  Married   Risk Factors: Smoking Status: quit (05/01/2009)  Review of  Systems  The patient denies fever, weight loss, weight gain, vision loss, decreased hearing, hoarseness, chest pain, syncope, dyspnea on exertion, peripheral edema, prolonged cough, abdominal pain, incontinence, muscle weakness, depression, and enlarged lymph nodes.    Vital Signs:  Patient profile:   70 year old male Height:      67 inches Weight:      181.75 pounds BMI:     28.57 Pulse rate:   70 / minute BP sitting:   126 / 60  (left arm) Cuff size:   regular  Vitals Entered By: Stanton Kidney, EMT-P (Apr 29, 2010 11:34 AM)  Physical Exam  General:  well-appearing gentleman in no apparent distress, HEENT exam is benign, oropharynx is clear, neck is supple with no JVP or carotid bruits, heart sounds are regular with S1-S2 and no murmurs, click apreciated, lungs are clear to auscultation with no wheezes Rales, abdominal exam is benign with no widened aortic pulsation, no significant lower extremity edema, neurologic exam is grossly nonfocal, skin is warm and dry. Pulses are equal and symmetrical in his upper and lower extremities.   New Orders:     1)  T-Basic Metabolic Panel [id number])   EKG  Procedure date:  04/29/2010  Findings:      atrial fibrillation with rate of 70 beats per minute, rare PVC, no significant ST or T wave changes, left axis deviation.  Impression & Recommendations:  Problem # 1:  CAD, ARTERY BYPASS GRAFT (ICD-414.04) CABG in 1989, MVR in 2007. last echo in 2009 showing well positioned mitral valve. We will discuss additional stress test with him on his next visit to rule out ischemia. Per the patient, the catheterization in 2007 at the time of his valve showed patent grafts. He is on warfarin and low-dose aspirin, cholesterol is well controlled on simvastatin.  His updated medication list for this problem includes:    Metoprolol Succinate 50 Mg Xr24h-tab (Metoprolol succinate) .Marland Kitchen... Take one tablet by mouth daily    Aspirin 81 Mg Tbec (Aspirin) .Marland Kitchen...  Take one tablet by mouth daily    Nitroglycerin 0.4 Mg Subl (Nitroglycerin) ..... One tablet under tongue every 5 minutes as needed for chest pain---may repeat times three    Warfarin Sodium 2.5 Mg Tabs (Warfarin sodium) ..... Use as directed by anticoagualtion clinic  Problem # 2:  ATRIAL FIBRILLATION (ICD-427.31) Rate control on metoprolol and digoxin. No changes made at this time. His INR today is 3.9 and he will decrease his warfarin dose to 5 mg Mondays and Fridays and 2.5 mg on the other days.  His updated medication list for this problem includes:    Metoprolol Succinate 50 Mg Xr24h-tab (Metoprolol succinate) .Marland Kitchen... Take one tablet by mouth daily    Aspirin 81 Mg Tbec (Aspirin) .Marland Kitchen... Take one tablet by mouth daily    Digoxin 0.125 Mg Tabs (Digoxin) .Marland Kitchen... Take one tablet by mouth daily    Warfarin Sodium 2.5 Mg Tabs (Warfarin sodium) ..... Use as directed by anticoagualtion clinic  Problem # 3:  HYPERLIPIDEMIA-MIXED (ICD-272.4) LDL is less than 70 on his current medication regimen.  His updated medication list for this problem includes:    Simvastatin  40 Mg Tabs (Simvastatin) .Marland Kitchen... Take one tablet by mouth daily at bedtime  Problem # 4:  SYSTOLIC HEART FAILURE, ACUTE (ICD-428.21) Recent admission to [facility] for pneumonia appeared he is on Lasix and we'll check his creatinine today to nature that he is not to over diuresis. His updated medication list for this problem includes:    Metoprolol Succinate 50 Mg Xr24h-tab (Metoprolol succinate) .Marland Kitchen... Take one tablet by mouth daily    Aspirin 81 Mg Tbec (Aspirin) .Marland Kitchen... Take one tablet by mouth daily    Furosemide 40 Mg Tabs (Furosemide) .Marland Kitchen... Take one tablet by mouth daily.    Losartan Potassium 100 Mg Tabs (Losartan potassium) .Marland Kitchen... 1 by mouth once daily    Digoxin 0.125 Mg Tabs (Digoxin) .Marland Kitchen... Take one tablet by mouth daily    Nitroglycerin 0.4 Mg Subl (Nitroglycerin) ..... One tablet under tongue every 5 minutes  as needed for chest pain---may repeat times three    Warfarin Sodium 2.5 Mg Tabs (Warfarin sodium) ..... Use as directed by anticoagualtion clinic  Other Orders: T-Basic Metabolic Panel [id number])  Patient Instructions: 1)  Your physician recommends that you schedule a follow-up appointment in: 3 months

## 2011-01-21 NOTE — Assessment & Plan Note (Signed)
Summary: F3M/GLC  Medications Added ULORIC 40 MG TABS (FEBUXOSTAT) Take 1 by mouth once daily as needed ACTOS 30 MG TABS (PIOGLITAZONE HCL) Take 1 tablet by mouth once a day      Allergies Added:   Visit Type:  Follow-up Primary Provider:  Dr. Marjo Bicker  CC:  no complaints.  History of Present Illness: [name] is a very pleasant 70 year old gentleman with a past medical history of coronary artery disease, bypass surgery, mitral valve replacement with prosthetic valve on Coumadin, history of chronic atrial fibrillation, diabetes, hypertension, hyperlipidemia, PNA in 03/2010, who presents for routine followup.  He states that he is very active. 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 He denies chest pain, shortness of breath. He was started on Actos though wants to discontinue this medication do to what he reads in the literature. He does have significant burning with urination which he believes is due to treatment of his prostate with radiation and radioactive seeds. He is on Lasix daily but he reports drinking a lot of fluid.  [name] had his last stress test in 2009, that showed ejection fraction 43%, inferior wall infarct with no ischemia. MV was functioning well.     His last echocardiogram was in April 2009 showed ejection fraction 45-50%.  Current Medications (verified): 1)  Metoprolol Succinate 50 Mg Xr24h-Tab (Metoprolol Succinate) .... Take One Tablet By Mouth Daily 2)  Simvastatin 40 Mg Tabs (Simvastatin) .... Take One Tablet By Mouth Daily At Bedtime 3)  Aspirin 81 Mg Tbec (Aspirin) .... Take One Tablet By Mouth Daily 4)  Furosemide 40 Mg Tabs (Furosemide) .... Take One Tablet By Mouth Daily. 5)  Losartan Potassium 100 Mg Tabs (Losartan Potassium) .Marland Kitchen.. 1 By Mouth Once Daily 6)  Digoxin 0.125 Mg Tabs (Digoxin) .... Take One Tablet By Mouth Daily 7)  Acetaminophen 500 Mg  Caps (Acetaminophen) .... As Needed 8)  Warfarin Sodium 2.5 Mg Tabs (Warfarin Sodium) ....  Use As Directed By Anticoagualtion Clinic 9)  Vitamin D3 1000 Unit Caps (Cholecalciferol) .Marland Kitchen.. 1 Tab By Mouth Once Daily 10)  Uloric 40 Mg Tabs (Febuxostat) .... Take 1 By Mouth Once Daily As Needed  Allergies (verified): 1)  ! Sulfa 2)  ! Codeine 3)  ! * Tylenol 3  Past History:  Past Medical History: Last updated: 05/01/2009 ATRIAL FIBRILLATION (ICD-427.31) HYPERTENSION, UNSPECIFIED (ICD-401.9) HYPERKALEMIA (ICD-276.7) SYSTOLIC HEART FAILURE, ACUTE (ICD-428.21) CAD, ARTERY BYPASS GRAFT (ICD-414.04)    Family History: Last updated: 05/01/2009 Family History of Coronary Artery Disease:  Family History of CVA or Stroke:   Social History: Last updated: 05/01/2009 Tobacco Use - Former. - 1989 Drug Use - no Retired  Married   Risk Factors: Smoking Status: quit (05/01/2009)  Review of Systems  The patient denies fever, weight loss, weight gain, vision loss, decreased hearing, hoarseness, chest pain, syncope, dyspnea on exertion, peripheral edema, prolonged cough, abdominal pain, incontinence, muscle weakness, depression, and enlarged lymph nodes.    Vital Signs:  Patient profile:   70 year old male Height:      67 inches Weight:      189 pounds Pulse rate:   84 / minute BP sitting:   152 / 87  (left arm) Cuff size:   regular  Vitals Entered By: Caralee Ates CMA (July 29, 2010 9:59 AM)  Physical Exam  General:  well-appearing gentleman in no apparent distress, HEENT exam is benign, oropharynx is clear, neck is supple with no JVP or carotid bruits, heart  sounds are regular with S1-S2 and no murmurs, click apreciated, lungs are clear to auscultation with no wheezes Rales, abdominal exam is benign with no widened aortic pulsation, no significant lower extremity edema, neurologic exam is grossly nonfocal, skin is warm and dry. Pulses are equal and symmetrical in his upper and lower extremities.   Impression & Recommendations:  Problem # 1:  MITRAL VALVE REPLACEMENT, HX  OF (ICD-V15.1) it has been 2 years since his last echocardiogram. I have suggested that he have an echocardiogram to look at his mitral valve. He would like to schedule this later in the year.  Future Orders: Echocardiogram (Echo) ... 10/22/2010  Problem # 2:  ATRIAL FIBRILLATION (ICD-427.31) Chronic atrial fibrillation with rate control. He is relatively asymptomatic.  His updated medication list for this problem includes:    Metoprolol Succinate 50 Mg Xr24h-tab (Metoprolol succinate) .Marland Kitchen... Take one tablet by mouth daily    Aspirin 81 Mg Tbec (Aspirin) .Marland Kitchen... Take one tablet by mouth daily    Digoxin 0.125 Mg Tabs (Digoxin) .Marland Kitchen... Take one tablet by mouth daily    Warfarin Sodium 2.5 Mg Tabs (Warfarin sodium) ..... Use as directed by anticoagualtion clinic  Problem # 3:  HYPERTENSION, UNSPECIFIED (ICD-401.9) He reports excellent blood pressures at home and we will make no medication changes. We did suggest that he could try Lasix 20 mg daily but he is drinking a significant amount of fluid, he might need the full pill per  His updated medication list for this problem includes:    Metoprolol Succinate 50 Mg Xr24h-tab (Metoprolol succinate) .Marland Kitchen... Take one tablet by mouth daily    Aspirin 81 Mg Tbec (Aspirin) .Marland Kitchen... Take one tablet by mouth daily    Furosemide 40 Mg Tabs (Furosemide) .Marland Kitchen... Take one tablet by mouth daily.    Losartan Potassium 100 Mg Tabs (Losartan potassium) .Marland Kitchen... 1 by mouth once daily  Problem # 4:  DIAB W/O COMP TYPE II/UNS NOT STATED UNCNTRL (ICD-250.00) I've asked him to talk with his primary care physician about Actos. He has read about it and does not want to continue this medication. Other options might be metformin.  His updated medication list for this problem includes:    Aspirin 81 Mg Tbec (Aspirin) .Marland Kitchen... Take one tablet by mouth daily    Losartan Potassium 100 Mg Tabs (Losartan potassium) .Marland Kitchen... 1 by mouth once daily    Actos 30 Mg Tabs (Pioglitazone hcl) .Marland Kitchen...  Take 1 tablet by mouth once a day  Other Orders: T-Lipid Profile [phone] T-Basic Metabolic Panel [phone]) T-Hepatic Function [phone])  Patient Instructions: 1)  Your physician recommends that you have lab work today: (Lip/LFT/BMP) 2)  Your physician recommends that you continue on your current medications as directed. Please refer to the Current Medication list given to you today. 3)  Your physician wants you to follow-up in:  6 months  You will receive a reminder letter in the mail two months in advance. If you don't receive a letter, please call our office to schedule the follow-up appointment. 4)  Your physician has requested that you have an echocardiogram.  Echocardiography is a painless test that uses sound waves to create images of your heart. It provides your doctor with information about the size and shape of your heart and how well your heart's chambers and valves are working.  This procedure takes approximately one hour. There are no restrictions for this procedure. December 2011

## 2011-01-21 NOTE — Medication Information (Signed)
Summary: CCR/AMD   Anticoagulant Therapy  Managed by: Robyn Haber, RN, BSN Referring MD: Valera Castle Supervising MD: Shirlee Latch MD, Acen Craun Indication 1: Atrial Fibrillation (ICD-427.31) Indication 2: Mitral Valve Replacement (ICD-V43.3) Lab Used: [facility] Site: [place] INR POC 2.5 INR RANGE 2.5-3.5  Dietary changes: no    Health status changes: no    Bleeding/hemorrhagic complications: no    Recent/future hospitalizations: no    Any changes in medication regimen? no    Recent/future dental: no  Any missed doses?: no       Is patient compliant with meds? yes       Allergies: 1)  ! Sulfa 2)  ! Codeine 3)  ! * Tylenol 3  Anticoagulation Management History:      The patient is taking warfarin and comes in today for a routine follow up visit.  Positive risk factors for bleeding include an age of 35 years or older and presence of serious comorbidities.  The bleeding index is 'intermediate risk'.  Positive CHADS2 values include History of CHF, History of HTN, and History of Diabetes.  Negative CHADS2 values include Age > 71 years old.  The start date was 03/16/2006.  His last INR was 2.0.  Anticoagulation responsible provider: Shirlee Latch MD, Saniyah Mondesir.  INR POC: 2.5.    Anticoagulation Management Assessment/Plan:      The patient's current anticoagulation dose is Warfarin sodium 2.5 mg tabs: Use as directed by Anticoagualtion Clinic.  The target INR is 2.5 - 3.5.  The next INR is due 05/03/2010.  Anticoagulation instructions were given to patient.  Results were reviewed/authorized by Robyn Haber, RN, BSN.  He was notified by Charlena Cross, RN, BSN.         Prior Anticoagulation Instructions: none today then coumadin 2.5 mg daily with 5 mg on Tues  Current Anticoagulation Instructions: coumadin 2.5 mg daily with 5 mg on Tues and Thurs  Appended Document: [facility]in Clinic     Anticoagulant Therapy  Managed by: Robyn Haber, RN, BSN Referring MD: Valera Castle Supervising MD: Shirlee Latch MD, Raijon Lindfors Indication 1: Atrial Fibrillation (ICD-427.31) Indication 2: Mitral Valve Replacement (ICD-V43.3) Lab Used: [facility] Site:  INR RANGE 2.5-3.5           Allergies: 1)  ! Sulfa 2)  ! Codeine 3)  ! * Tylenol 3  Anticoagulation Management History:      Positive risk factors for bleeding include an age of 62 years or older and presence of serious comorbidities.  The bleeding index is 'intermediate risk'.  Positive CHADS2 values include History of CHF, History of HTN, and History of Diabetes.  Negative CHADS2 values include Age > 65 years old.  The start date was 03/16/2006.  His last INR was 2.0.  Anticoagulation responsible provider: Shirlee Latch MD, Clerence Gubser.    Anticoagulation Management Assessment/Plan:      The patient's current anticoagulation dose is Warfarin sodium 2.5 mg tabs: Use as directed by Anticoagualtion Clinic.  The target INR is 2.5 - 3.5.  The next INR is due 05/03/2010.  Anticoagulation instructions were given to patient.  Results were reviewed/authorized by Robyn Haber, RN, BSN.  He was notified by Charlena Cross, RN, BSN.         Prior Anticoagulation Instructions: coumadin 2.5 mg daily with 5 mg on Tues and Thurs

## 2011-01-21 NOTE — Medication Information (Signed)
Summary: CCR/AMD   Anticoagulant Therapy  Managed by: Robyn Haber, RN, BSN Referring MD: Valera Castle Supervising MD: Daleen Squibb MD, Maisie Fus Indication 1: Atrial Fibrillation (ICD-427.31) Indication 2: Mitral Valve Replacement (ICD-V43.3) Lab Used: [facility] Site: [place] INR POC 2.1 INR RANGE 2.5-3.5  Dietary changes: no    Health status changes: no    Bleeding/hemorrhagic complications: no    Recent/future hospitalizations: no    Any changes in medication regimen? no    Recent/future dental: no  Any missed doses?: no       Is patient compliant with meds? yes       Allergies: 1)  ! Sulfa 2)  ! Codeine 3)  ! * Tylenol 3  Anticoagulation Management History:      The patient is taking warfarin and comes in today for a routine follow up visit.  Positive risk factors for bleeding include an age of 72 years or older.  The bleeding index is 'intermediate risk'.  Positive CHADS2 values include History of CHF and History of HTN.  Negative CHADS2 values include Age > 72 years old.  The start date was 03/16/2006.  His last INR was 2.0.  Anticoagulation responsible provider: Daleen Squibb MD, Maisie Fus.  INR POC: 2.1.    Anticoagulation Management Assessment/Plan:      The patient's current anticoagulation dose is Warfarin sodium 2.5 mg tabs: Use as directed by Anticoagualtion Clinic.  The target INR is 2.5 - 3.5.  The next INR is due 01/22/2010.  Anticoagulation instructions were given to patient.  Results were reviewed/authorized by Robyn Haber, RN, BSN.  He was notified by Charlena Cross, RN, BSN.         Prior Anticoagulation Instructions: The patient is to continue with the same dose of coumadin.  This dosage includes: coumadin 2.5 mg daily  Current Anticoagulation Instructions: coumadin 5 mg today then resume coumadin 2.5 mg daily

## 2011-01-21 NOTE — Assessment & Plan Note (Signed)
Summary: EC6/AMD  Medications Added LOSARTAN POTASSIUM 50 MG TABS (LOSARTAN POTASSIUM) take 2 tabs by mouth daily VITAMIN D3 1000 UNIT CAPS (CHOLECALCIFEROL) 1 tab by mouth once daily      Allergies Added:   History of Present Illness: [name] is a very pleasant 70 year old gentleman with a past medical history of coronary artery disease, bypass surgery, mitral valve replacement with prosthetic valve is on Coumadin, history of chronic nature fibrillation, diabetes, hypertension, hyperlipidemia who presents for routine followup.  [name] states that overall he is feeling well. He denies any shortness of breath or chest pain. 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 He denies any significant lower extremity edema. He is concerned about the cost of his Diovan and would like to change the medication.  [name] had his last stress test in 2009, that showed ejection fraction 43%, inferior wall infarct with no ischemia. His last echocardiogram was in April 2009 showed ejection fraction 45-50%.  Current Medications (verified): 1)  Metoprolol Succinate 50 Mg Xr24h-Tab (Metoprolol Succinate) .... Take One Tablet By Mouth Daily 2)  Simvastatin 40 Mg Tabs (Simvastatin) .... Take One Tablet By Mouth Daily At Bedtime 3)  Aspirin 81 Mg Tbec (Aspirin) .... Take One Tablet By Mouth Daily 4)  Furosemide 40 Mg Tabs (Furosemide) .... Take One Tablet By Mouth Daily. 5)  Diovan 80 Mg Tabs (Valsartan) .... Take One Tablet By Mouth Two Times A Day 6)  Digoxin 0.125 Mg Tabs (Digoxin) .... Take One Tablet By Mouth Daily 7)  Nitroglycerin 0.4 Mg Subl (Nitroglycerin) .... One Tablet Under Tongue Every 5 Minutes As Needed For Chest Pain---May Repeat Times Three 8)  Acetaminophen 500 Mg  Caps (Acetaminophen) .... As Needed 9)  Warfarin Sodium 2.5 Mg Tabs (Warfarin Sodium) .... Use As Directed By Anticoagualtion Clinic 10)  Vitamin D3 1000 Unit Caps (Cholecalciferol) .Marland Kitchen.. 1 Tab By  Mouth Once Daily  Allergies (verified): 1)  ! Sulfa 2)  ! Codeine 3)  ! * Tylenol 3  Past History:  Past Medical History: Last updated: 05/01/2009 ATRIAL FIBRILLATION (ICD-427.31) HYPERTENSION, UNSPECIFIED (ICD-401.9) HYPERKALEMIA (ICD-276.7) SYSTOLIC HEART FAILURE, ACUTE (ICD-428.21) CAD, ARTERY BYPASS GRAFT (ICD-414.04)    Family History: Last updated: 05/01/2009 Family History of Coronary Artery Disease:  Family History of CVA or Stroke:   Social History: Last updated: 05/01/2009 Tobacco Use - Former. - 1989 Drug Use - no Retired  Married   Risk Factors: Smoking Status: quit (05/01/2009)  Review of Systems  The patient denies anorexia, fever, weight loss, weight gain, vision loss, decreased hearing, hoarseness, chest pain, syncope, dyspnea on exertion, peripheral edema, prolonged cough, headaches, hemoptysis, abdominal pain, melena, hematochezia, severe indigestion/heartburn, hematuria, incontinence, genital sores, muscle weakness, suspicious skin lesions, transient blindness, difficulty walking, depression, unusual weight change, abnormal bleeding, enlarged lymph nodes, angioedema, breast masses, and testicular masses.     Vital Signs:  Patient profile:   70 year old male Height:      67 inches Weight:      184.75 pounds BMI:     29.04 Pulse rate:   87 / minute Pulse rhythm:   regular BP sitting:   136 / 60  (left arm) Cuff size:   regular  Vitals Entered By: Mercer Pod (January 25, 2010 3:41 PM)  Physical Exam  General:  well-appearing gentleman in no apparent distress. HEENT exam is benign, oropharynx is clear, neck is supple with no JVP or carotid bruits, heart sounds are regular with  normal S1-S2 and no murmurs appreciated, lungs are clear to auscultation with no wheezes Rales, balance is notable for mild obesity but otherwise benign and nontender. Trace lower extremity edema in the right lower extremity, neurological exam is grossly nonfocal skin  is warm and dry. Pulses are equal and symmetrical in his upper and lower extremities.   Impression & Recommendations:  Problem # 1:  CAD, ARTERY BYPASS GRAFT (ICD-414.04) stable coronary artery disease, history of bypass in 2007. Last stress test in April 2009 with no ischemia. Will continue him on his current medication regiment. We have made one change to the Diovan will change this to losartan. His updated medication list for this problem includes:    Metoprolol Succinate 50 Mg Xr24h-tab (Metoprolol succinate) .Marland Kitchen... Take one tablet by mouth daily    Aspirin 81 Mg Tbec (Aspirin) .Marland Kitchen... Take one tablet by mouth daily    Nitroglycerin 0.4 Mg Subl (Nitroglycerin) ..... One tablet under tongue every 5 minutes as needed for chest pain---may repeat times three    Warfarin Sodium 2.5 Mg Tabs (Warfarin sodium) ..... Use as directed by anticoagualtion clinic  Problem # 2:  ATRIAL FIBRILLATION (ICD-427.31) adequate rate control. Continue him on warfarin. His updated medication list for this problem includes:    Metoprolol Succinate 50 Mg Xr24h-tab (Metoprolol succinate) .Marland Kitchen... Take one tablet by mouth daily    Aspirin 81 Mg Tbec (Aspirin) .Marland Kitchen... Take one tablet by mouth daily    Digoxin 0.125 Mg Tabs (Digoxin) .Marland Kitchen... Take one tablet by mouth daily    Warfarin Sodium 2.5 Mg Tabs (Warfarin sodium) ..... Use as directed by anticoagualtion clinic  Problem # 3:  HYPERTENSION, UNSPECIFIED (ICD-401.9) we will change the Diovan to losartan secondary to cost. I suggested to him that he cut the losartan 100 mg tablet in half and take a 50 mg tablet and monitor his blood pressure. His updated medication list for this problem includes:    Metoprolol Succinate 50 Mg Xr24h-tab (Metoprolol succinate) .Marland Kitchen... Take one tablet by mouth daily    Aspirin 81 Mg Tbec (Aspirin) .Marland Kitchen... Take one tablet by mouth daily    Furosemide 40 Mg Tabs (Furosemide) .Marland Kitchen... Take one tablet by mouth daily.    Losartan Potassium 50 Mg Tabs  (Losartan potassium) .Marland Kitchen... Take 2 tabs by mouth daily  Problem # 4:  HYPERLIPIDEMIA-MIXED (ICD-272.4) we will try to obtain the most recent lipid panel from Dr. Suzie Portela at the current [facility] clinic. His updated medication list for this problem includes:    Simvastatin 40 Mg Tabs (Simvastatin) .Marland Kitchen... Take one tablet by mouth daily at bedtime  Problem # 5:  DIAB W/O COMP TYPE II/UNS NOT STATED UNCNTRL (ICD-250.00) hemoglobin A1c is 7.1. This is above goal and a ferritin to watch his diet to lose several pounds of weight. His updated medication list for this problem includes:    Aspirin 81 Mg Tbec (Aspirin) .Marland Kitchen... Take one tablet by mouth daily    Losartan Potassium 50 Mg Tabs (Losartan potassium) .Marland Kitchen... Take 2 tabs by mouth daily Prescriptions: LOSARTAN POTASSIUM 50 MG TABS (LOSARTAN POTASSIUM) take 2 tabs by mouth daily  #60 x 3   Entered by:   Charlena Cross, RN, BSN   Authorized by:   Dossie Arbour MD   Signed by:   Charlena Cross, RN, BSN on 01/25/2010   Method used:   Electronically to        [facility] (retail)       [address]  [place], [place]  [address]       Ph: [phone]       Fax: [phone]   RxID:   [id number]

## 2011-01-21 NOTE — Medication Information (Signed)
Summary: CCR/AMD  Medications Added INDOMETHACIN 25 MG CAPS (INDOMETHACIN) 1 tab three times a day for 3 more days       Anticoagulant Therapy  Managed by: Robyn Haber, RN, BSN Referring MD: Valera Castle Supervising MD: Shirlee Latch MD, Amberia Bayless Indication 1: Atrial Fibrillation (ICD-427.31) Indication 2: Mitral Valve Replacement (ICD-V43.3) Lab Used: [facility] Site: [place] INR POC 3.8 INR RANGE 2.5-3.5  Dietary changes: no    Health status changes: no    Bleeding/hemorrhagic complications: no    Recent/future hospitalizations: no    Any changes in medication regimen? no    Recent/future dental: no  Any missed doses?: no       Is patient compliant with meds? yes       Current Medications (verified): 1)  Metoprolol Succinate 50 Mg Xr24h-Tab (Metoprolol Succinate) .... Take One Tablet By Mouth Daily 2)  Simvastatin 40 Mg Tabs (Simvastatin) .... Take One Tablet By Mouth Daily At Bedtime 3)  Aspirin 81 Mg Tbec (Aspirin) .... Take One Tablet By Mouth Daily 4)  Furosemide 40 Mg Tabs (Furosemide) .... Take One Tablet By Mouth Daily. 5)  Losartan Potassium 100 Mg Tabs (Losartan Potassium) .Marland Kitchen.. 1 By Mouth Once Daily 6)  Digoxin 0.125 Mg Tabs (Digoxin) .... Take One Tablet By Mouth Daily 7)  Nitroglycerin 0.4 Mg Subl (Nitroglycerin) .... One Tablet Under Tongue Every 5 Minutes As Needed For Chest Pain---May Repeat Times Three 8)  Acetaminophen 500 Mg  Caps (Acetaminophen) .... As Needed 9)  Warfarin Sodium 2.5 Mg Tabs (Warfarin Sodium) .... Use As Directed By Anticoagualtion Clinic 10)  Vitamin D3 1000 Unit Caps (Cholecalciferol) .Marland Kitchen.. 1 Tab By Mouth Once Daily 11)  Indomethacin 25 Mg Caps (Indomethacin) .Marland Kitchen.. 1 Tab Three Times A Day For 3 More Days  Allergies: 1)  ! Sulfa 2)  ! Codeine 3)  ! * Tylenol 3  Anticoagulation Management History:      The patient is taking warfarin and comes in today for a routine follow up visit.   Positive risk factors for bleeding include an age of 75 years or older and presence of serious comorbidities.  The bleeding index is 'intermediate risk'.  Positive CHADS2 values include History of CHF, History of HTN, and History of Diabetes.  Negative CHADS2 values include Age > 56 years old.  The start date was 03/16/2006.  His last INR was 2.0.  Anticoagulation responsible provider: Shirlee Latch MD, Sevastian Witczak.  INR POC: 3.8.    Anticoagulation Management Assessment/Plan:      The patient's current anticoagulation dose is Warfarin sodium 2.5 mg tabs: Use as directed by Anticoagualtion Clinic.  The target INR is 2.5 - 3.5.  The next INR is due 04/19/2010.  Anticoagulation instructions were given to patient.  Results were reviewed/authorized by Robyn Haber, RN, BSN.  He was notified by Charlena Cross, RN, BSN.         Prior Anticoagulation Instructions: coumadin 2.5 mg daily with 3.125 mg on Tues  Current Anticoagulation Instructions: none today then coumadin 2.5 mg daily with 5 mg on Tues

## 2011-01-21 NOTE — Letter (Signed)
[facility]   Imported By: Harlon Flor 04/04/2010 09:31:32  _____________________________________________________________________  External Attachment:    Type:   Image     Comment:   External Document

## 2011-01-21 NOTE — Medication Information (Signed)
Summary: rov/ewj  Anticoagulant Therapy  Managed by: Cloyde Reams, RN, BSN Referring MD: Valera Castle PCP: Dr. Clarene Duke MD: Mariah Milling Indication 1: Atrial Fibrillation (ICD-427.31) Indication 2: Mitral Valve Replacement (ICD-V43.3) Lab Used: [facility] Site: [place] INR POC 3.7 INR RANGE 2.5-3.5  Dietary changes: no    Health status changes: no    Bleeding/hemorrhagic complications: no    Recent/future hospitalizations: no    Any changes in medication regimen? no    Recent/future dental: no  Any missed doses?: no       Is patient compliant with meds? yes       Allergies: 1)  ! Sulfa 2)  ! Codeine 3)  ! * Tylenol 3  Anticoagulation Management History:      The patient is taking warfarin and comes in today for a routine follow up visit.  Positive risk factors for bleeding include an age of 72 years or older and presence of serious comorbidities.  The bleeding index is 'intermediate risk'.  Positive CHADS2 values include History of CHF, History of HTN, and History of Diabetes.  Negative CHADS2 values include Age > 72 years old.  The start date was 03/16/2006.  His last INR was 2.0.  Anticoagulation responsible provider: [place].  INR POC: 3.7.  Cuvette Lot#: [id number].  Exp: 10/2011.    Anticoagulation Management Assessment/Plan:      The patient's current anticoagulation dose is Warfarin sodium 2.5 mg tabs: Use as directed by Anticoagualtion Clinic.  The target INR is 2.5 - 3.5.  The next INR is due 10/16/2010.  Anticoagulation instructions were given to patient.  Results were reviewed/authorized by Cloyde Reams, RN, BSN.  He was notified by Cloyde Reams RN.         Prior Anticoagulation Instructions: INR 4.2  Skip today's dosage then resume 1 tablet daily except 2 tablets on Mondays.  Recheck in 3 weeks.    Current Anticoagulation Instructions: INR 3.7  Start taking 1 tablet daily except 1.5 tablets on Mondays.  Recheck  in 3-4 weeks.

## 2011-01-21 NOTE — Medication Information (Signed)
Summary: ccr/alt  Anticoagulant Therapy  Managed by: Bethena Midget, RN, BSN Referring MD: Valera Castle PCP: Dr. Clarene Duke MD: Mariah Milling Indication 1: Atrial Fibrillation (ICD-427.31) Indication 2: Mitral Valve Replacement (ICD-V43.3) Lab Used: [facility] Site: [place] INR POC 2.4 INR RANGE 2.5-3.5  Dietary changes: yes       Details: Pt states he ate alot of extra green this past week.   Health status changes: no    Bleeding/hemorrhagic complications: no    Recent/future hospitalizations: no    Any changes in medication regimen? yes       Details: Phenazopyridine HCL 200mg s and Hyomax SR 375mg   Recent/future dental: no  Any missed doses?: no       Is patient compliant with meds? yes       Allergies: 1)  ! Sulfa 2)  ! Codeine 3)  ! * Tylenol 3  Anticoagulation Management History:      The patient is taking warfarin and comes in today for a routine follow up visit.  Positive risk factors for bleeding include an age of 73 years or older and presence of serious comorbidities.  The bleeding index is 'intermediate risk'.  Positive CHADS2 values include History of CHF, History of HTN, and History of Diabetes.  Negative CHADS2 values include Age > 38 years old.  The start date was 03/16/2006.  His last INR was 2.0.  Anticoagulation responsible provider: Goll[facility].  INR POC: 2.4.  Cuvette Lot#: [id number].  Exp: 11/2011.    Anticoagulation Management Assessment/Plan:      The patient's current anticoagulation dose is Warfarin sodium 2.5 mg tabs: Use as directed by Anticoagualtion Clinic.  The target INR is 2.5 - 3.5.  The next INR is due 12/18/2010.  Anticoagulation instructions were given to patient.  Results were reviewed/authorized by Bethena Midget, RN, BSN.  He was notified by Bethena Midget, RN, BSN.         Prior Anticoagulation Instructions: INR 2.9  Continue taking 1 tab daily except for 1.5 tabs on Monday. Recheck in 4 weeks.    Current Anticoagulation Instructions: INR 2.4 Today take 5mg s then resume 2.5mg s daily except 5mg s on Mondays. Recheck in 4 weeks.

## 2011-01-21 NOTE — Medication Information (Signed)
Summary: CCR/NE  Anticoagulant Therapy  Managed by: Cloyde Reams, RN, BSN Referring MD: Valera Castle Supervising MD: Mariah Milling Indication 1: Atrial Fibrillation (ICD-427.31) Indication 2: Mitral Valve Replacement (ICD-V43.3) Lab Used: [facility]--[place] Site: [place] INR POC 4.7 INR RANGE 2.5-3.5  Dietary changes: no    Health status changes: no    Bleeding/hemorrhagic complications: no    Recent/future hospitalizations: no    Any changes in medication regimen? no    Recent/future dental: no  Any missed doses?: no       Is patient compliant with meds? yes       Allergies: 1)  ! Sulfa 2)  ! Codeine 3)  ! * Tylenol 3  Anticoagulation Management History:      Positive risk factors for bleeding include an age of 36 years or older and presence of serious comorbidities.  The bleeding index is 'intermediate risk'.  Positive CHADS2 values include History of CHF, History of HTN, and History of Diabetes.  Negative CHADS2 values include Age > 53 years old.  The start date was 03/16/2006.  His last INR was 2.0.  Anticoagulation responsible provider: Miller Limehouse.  INR POC: 4.7.  Exp: 04/2011.    Anticoagulation Management Assessment/Plan:      The patient's current anticoagulation dose is Warfarin sodium 2.5 mg tabs: Use as directed by Anticoagualtion Clinic.  The target INR is 2.5 - 3.5.  The next INR is due 07/31/2010.  Anticoagulation instructions were given to patient.  Results were reviewed/authorized by Cloyde Reams, RN, BSN.  He was notified by Cloyde Reams RN.         Prior Anticoagulation Instructions: INR 3.4  Continue same dose of 1 tab daily except 2 tabs on Monday and Friday. Recheck in 4 weeks  Current Anticoagulation Instructions: INR 4.7  Skip today's dosage of Coumadin, then start taking 1 tablet daily except 2 tablets on Mondays.  Recheck in 2 weeks.

## 2011-01-21 NOTE — Medication Information (Signed)
Summary: [facility] Clinic  Anticoagulant Therapy  Managed by: Cloyde Reams, RN, BSN Referring MD: Valera Castle Supervising MD: Mariah Milling Indication 1: Atrial Fibrillation (ICD-427.31) Indication 2: Mitral Valve Replacement (ICD-V43.3) Lab Used: [facility] Site:  INR POC 3.9 INR RANGE 2.5-3.5           Allergies: 1)  ! Sulfa 2)  ! Codeine 3)  ! * Tylenol 3  Anticoagulation Management History:      The patient is taking warfarin and comes in today for a routine follow up visit.  Positive risk factors for bleeding include an age of 72 years or older and presence of serious comorbidities.  The bleeding index is 'intermediate risk'.  Positive CHADS2 values include History of CHF, History of HTN, and History of Diabetes.  Negative CHADS2 values include Age > 6 years old.  The start date was 03/16/2006.  His last INR was 2.0.  Anticoagulation responsible provider: Sadao Weyer.  INR POC: 3.9.  Cuvette Lot#: [id number].  Exp: 04/2011.    Anticoagulation Management Assessment/Plan:      The patient's current anticoagulation dose is Warfarin sodium 2.5 mg tabs: Use as directed by Anticoagualtion Clinic.  The target INR is 2.5 - 3.5.  The next INR is due 05/21/2010.  Anticoagulation instructions were given to patient.  Results were reviewed/authorized by Cloyde Reams, RN, BSN.  He was notified by Cloyde Reams RN.         Prior Anticoagulation Instructions: coumadin 2.5 mg daily with 5 mg on Tues and Thurs  Current Anticoagulation Instructions: INR 3.9  Take 2.5mg  today then start taking 2.5mg  daily except 5mg  on Mondays and Fridays.  Recheck 3 weeks per Dr Mariah Milling.

## 2011-01-21 NOTE — Medication Information (Signed)
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 yes       Details: uloric   Any missed doses?: no       Is patient compliant with meds? yes      Comments: pt has been taking 5mg  on Monday and Friday and 2.5 mg everyday.   Allergies: 1)  ! Sulfa 2)  ! Codeine 3)  ! * Tylenol 3  Anticoagulation Management History:      The patient is taking warfarin and comes in today for a routine follow up visit.  Positive risk factors for bleeding include an age of 72 years or older and presence of serious comorbidities.  The bleeding index is 'intermediate risk'.  Positive CHADS2 values include History of CHF, History of HTN, and History of Diabetes.  Negative CHADS2 values include Age > 80 years old.  The start date was 03/16/2006.  His last INR was 2.0.  Anticoagulation responsible Samy Ryner: Gollan.  INR POC: 4.4.  Exp: 04/2011.    Anticoagulation Management Assessment/Plan:      The patient's current anticoagulation dose is Warfarin sodium 2.5 mg tabs: Use as directed by Anticoagualtion Clinic.  The target INR is 2.5 - 3.5.  The next INR is due 08/08/2010.  Anticoagulation instructions were given to patient.  Results were reviewed/authorized by Cloyde Reams, RN, BSN.  He was notified by Benedict Needy, RN.         Prior Anticoagulation Instructions: INR 4.7  Skip today's dosage of Coumadin, then start taking 1 tablet daily except 2 tablets on Mondays.  Recheck in 2 weeks.    Current Anticoagulation Instructions: INR 4.4  Skip todays dose and Start taking 2.5 mg daily except for 5mg  on Monday.  Recheck in 10 days.

## 2011-01-21 NOTE — Medication Information (Signed)
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 no     Any missed doses?: no       Is patient compliant with meds? yes       Allergies: 1)  ! Sulfa 2)  ! Codeine 3)  ! * Tylenol 3  Anticoagulation Management History:      The patient is taking warfarin and comes in today for a routine follow up visit.  Positive risk factors for bleeding include an age of 72 years or older and presence of serious comorbidities.  The bleeding index is 'intermediate risk'.  Positive CHADS2 values include History of CHF, History of HTN, and History of Diabetes.  Negative CHADS2 values include Age > 52 years old.  The start date was 03/16/2006.  His last INR was 2.0.  Anticoagulation responsible provider: Gollan.  INR POC: 3.4.  Exp: 04/2011.    Anticoagulation Management Assessment/Plan:      The patient's current anticoagulation dose is Warfarin sodium 2.5 mg tabs: Use as directed by Anticoagualtion Clinic.  The target INR is 2.5 - 3.5.  The next INR is due 07/17/2010.  Anticoagulation instructions were given to patient.  Results were reviewed/authorized by Cloyde Reams, RN, BSN.  He was notified by Benedict Needy, RN.         Prior Anticoagulation Instructions: INR 3.3  Continue on same dosage 2.5mg  daily except 5mg  on Mondays and Fridays.  Recheck in 4 weeks.   Current Anticoagulation Instructions: INR 3.4  Continue same dose of 1 tab daily except 2 tabs on Monday and Friday. Recheck in 4 weeks

## 2011-01-21 NOTE — Medication Information (Signed)
Summary: CCR/AMD   Anticoagulant Therapy  Managed by: Cloyde Reams, RN, BSN Referring MD: Valera Castle PCP: Dr. 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 no     Any missed doses?: no       Is patient compliant with meds? yes       Allergies: 1)  ! Sulfa 2)  ! Codeine 3)  ! * Tylenol 3  Anticoagulation Management History:      The patient is taking warfarin and comes in today for a routine follow up visit.  Positive risk factors for bleeding include an age of 72 years or older and presence of serious comorbidities.  The bleeding index is 'intermediate risk'.  Positive CHADS2 values include History of CHF, History of HTN, and History of Diabetes.  Negative CHADS2 values include Age > 45 years old.  The start date was 03/16/2006.  His last INR was 2.0.  Anticoagulation responsible provider: Jasey Cortez.  INR POC: 2.9.  Cuvette Lot#: [id number].  Exp: 09/2011.    Anticoagulation Management Assessment/Plan:      The patient's current anticoagulation dose is Warfarin sodium 2.5 mg tabs: Use as directed by Anticoagualtion Clinic.  The target INR is 2.5 - 3.5.  The next INR is due 08/28/2010.  Anticoagulation instructions were given to patient.  Results were reviewed/authorized by Cloyde Reams, RN, BSN.  He was notified by Benedict Needy, RN.         Prior Anticoagulation Instructions: INR 4.4  Skip todays dose and Start taking 2.5 mg daily except for 5mg  on Monday. Recheck in 10 days.     Current Anticoagulation Instructions: INR 2.9   Continue taking 1 tab daily except for 2 tabs on Mondays. Recheck in 3 weeks.

## 2011-01-21 NOTE — Medication Information (Signed)
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  Any changes in medication regimen? no     Any missed doses?: no       Is patient compliant with meds? yes       Allergies: 1)  ! Sulfa 2)  ! Codeine 3)  ! * Tylenol 3  Anticoagulation Management History:      The patient is taking warfarin and comes in today for a routine follow up visit.  Positive risk factors for bleeding include an age of 72 years or older and presence of serious comorbidities.  The bleeding index is 'intermediate risk'.  Positive CHADS2 values include History of CHF, History of HTN, and History of Diabetes.  Negative CHADS2 values include Age > 89 years old.  The start date was 03/16/2006.  His last INR was 2.0.  Anticoagulation responsible provider: Zendayah Hardgrave.  INR POC: 3.3.  Exp: 04/2011.    Anticoagulation Management Assessment/Plan:      The patient's current anticoagulation dose is Warfarin sodium 2.5 mg tabs: Use as directed by Anticoagualtion Clinic.  The target INR is 2.5 - 3.5.  The next INR is due 06/18/2010.  Anticoagulation instructions were given to patient.  Results were reviewed/authorized by Cloyde Reams, RN, BSN.  He was notified by Cloyde Reams RN.         Prior Anticoagulation Instructions: INR 3.9  Take 2.5mg  today then start taking 2.5mg  daily except 5mg  on Mondays and Fridays.  Recheck 3 weeks per Dr Mariah Milling.    Current Anticoagulation Instructions: INR 3.3  Continue on same dosage 2.5mg  daily except 5mg  on Mondays and Fridays.  Recheck in 4 weeks.

## 2011-01-21 NOTE — Progress Notes (Signed)
Summary: RX  Medications Added LOSARTAN POTASSIUM 100 MG TABS (LOSARTAN POTASSIUM) 1 by mouth once daily       Phone Note Call from Patient Call back at Home Phone [phone]   Caller: WIFE Call For: [facility] Summary of Call: INSURANCE WILL NOT COVER LOSARTIN THE WAY THAT IT WAS PRESCRIBED-THEY WILL HONOR A 100 MG TABLET PER DAY-[facility]-ONLY HAS 2 PILLS LEFT Initial call taken by: Harlon Flor,  March 12, 2010 10:12 AM  Follow-up for Phone Call        will talk to Vianne Grieshop about just him taking  an 100mg  of losartan. will call pharm back shortly.   Follow-up by: Mercer Pod,  March 12, 2010 1:36 PM    New/Updated Medications: LOSARTAN POTASSIUM 100 MG TABS (LOSARTAN POTASSIUM) 1 by mouth once daily Prescriptions: LOSARTAN POTASSIUM 100 MG TABS (LOSARTAN POTASSIUM) 1 by mouth once daily  #30 x 6   Entered by:   Mercer Pod   Authorized by:   Dossie Arbour MD   Signed by:   Mercer Pod on 03/12/2010   Method used:   Electronically to        [facility] (retail)       [address]       [place], [place]  [address]       Ph: [phone]       Fax: [phone]   RxID:   [id number]

## 2011-01-21 NOTE — Medication Information (Signed)
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 no    Recent/future dental: no  Any missed doses?: no       Is patient compliant with meds? yes       Allergies: 1)  ! Sulfa 2)  ! Codeine 3)  ! * Tylenol 3  Anticoagulation Management History:      The patient is taking warfarin and comes in today for a routine follow up visit.  Positive risk factors for bleeding include an age of 45 years or older and presence of serious comorbidities.  The bleeding index is 'intermediate risk'.  Positive CHADS2 values include History of CHF, History of HTN, and History of Diabetes.  Negative CHADS2 values include Age > 7 years old.  The start date was 03/16/2006.  His last INR was 2.0.  Anticoagulation responsible provider: gollan.  INR POC: 2.2.    Anticoagulation Management Assessment/Plan:      The patient's current anticoagulation dose is Warfarin sodium 2.5 mg tabs: Use as directed by Anticoagualtion Clinic.  The target INR is 2.5 - 3.5.  The next INR is due 03/19/2010.  Anticoagulation instructions were given to patient.  Results were reviewed/authorized by Robyn Haber, RN, BSN.  He was notified by Charlena Cross, RN, BSN.         Prior Anticoagulation Instructions: coumadin 5 mg today then resume coumadin 2.5 mg daily  Current Anticoagulation Instructions: coumadin 5 mg today then coumadin 2.5 mg dailyThe patient is to continue with the same dose of coumadin.  This dosage includes:

## 2011-01-23 NOTE — Medication Information (Signed)
Summary: rov/ewj  Anticoagulant Therapy  Managed by: Cloyde Reams, RN, BSN Referring MD: Valera Castle PCP: Dr. 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 yes       Details: Started on a new diabetic medication, unsure of name  Recent/future dental: no  Any missed doses?: no       Is patient compliant with meds? yes       Allergies: 1)  ! Sulfa 2)  ! Codeine 3)  ! * Tylenol 3  Anticoagulation Management History:      The patient is taking warfarin and comes in today for a routine follow up visit.  Positive risk factors for bleeding include an age of 72 years or older and presence of serious comorbidities.  The bleeding index is 'intermediate risk'.  Positive CHADS2 values include History of CHF, History of HTN, and History of Diabetes.  Negative CHADS2 values include Age > 72 years old.  The start date was 03/16/2006.  His last INR was 2.0.  Anticoagulation responsible provider: Bensimhon MD, Reuel Boom.  INR POC: 3.7.  Cuvette Lot#: [id number].  Exp: 01/2012.    Anticoagulation Management Assessment/Plan:      The patient's current anticoagulation dose is Warfarin sodium 2.5 mg tabs: Use as directed by Anticoagualtion Clinic.  The target INR is 2.5 - 3.5.  The next INR is due 02/12/2011.  Anticoagulation instructions were given to patient.  Results were reviewed/authorized by Cloyde Reams, RN, BSN.  He was notified by Cloyde Reams RN.         Prior Anticoagulation Instructions: INR 3.6  Eat something green leafy today.  Continue on same dosage 2.5mg  daily except 5mg  on  Mondays.  Recheck in 4 weeks.   Current Anticoagulation Instructions: INR 3.7  Start taking 2.5mg  daily.  Recheck in 4 weeks.

## 2011-01-23 NOTE — Medication Information (Signed)
Summary: rov/tm  Anticoagulant Therapy  Managed by: Cloyde Reams, RN, BSN Referring MD: Valera Castle PCP: Dr. 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   Health status changes: no    Bleeding/hemorrhagic complications: no    Recent/future hospitalizations: no    Any changes in medication regimen? no    Recent/future dental: no  Any missed doses?: no       Is patient compliant with meds? yes       Allergies: 1)  ! Sulfa 2)  ! Codeine 3)  ! * Tylenol 3  Anticoagulation Management History:      The patient is taking warfarin and comes in today for a routine follow up visit.  Positive risk factors for bleeding include an age of 72 years or older and presence of serious comorbidities.  The bleeding index is 'intermediate risk'.  Positive CHADS2 values include History of CHF, History of HTN, and History of Diabetes.  Negative CHADS2 values include Age > 72 years old.  The start date was 03/16/2006.  His last INR was 2.0.  Anticoagulation responsible Ger Nicks: Gollan.  INR POC: 3.6.  Cuvette Lot#: [id number].  Exp: 11/2011.    Anticoagulation Management Assessment/Plan:      The patient's current anticoagulation dose is Warfarin sodium 2.5 mg tabs: Use as directed by Anticoagualtion Clinic.  The target INR is 2.5 - 3.5.  The next INR is due 01/15/2011.  Anticoagulation instructions were given to patient.  Results were reviewed/authorized by Cloyde Reams, RN, BSN.  He was notified by Cloyde Reams RN.         Prior Anticoagulation Instructions: INR 2.4 Today take 5mg s then resume 2.5mg s daily except 5mg s on Mondays. Recheck in 4 weeks.    Current Anticoagulation Instructions: INR 3.6  Eat something green  leafy today.  Continue on same dosage 2.5mg  daily except 5mg  on Mondays.  Recheck in 4 weeks.

## 2011-01-29 ENCOUNTER — Encounter: Payer: Self-pay | Admitting: Cardiovascular Disease

## 2011-01-29 ENCOUNTER — Ambulatory Visit (INDEPENDENT_AMBULATORY_CARE_PROVIDER_SITE_OTHER): Payer: Medicare Other | Admitting: Cardiovascular Disease

## 2011-01-29 DIAGNOSIS — I10 Essential (primary) hypertension: Secondary | ICD-10-CM

## 2011-01-29 DIAGNOSIS — G4700 Insomnia, unspecified: Secondary | ICD-10-CM

## 2011-01-29 DIAGNOSIS — I059 Rheumatic mitral valve disease, unspecified: Secondary | ICD-10-CM

## 2011-01-29 DIAGNOSIS — I2510 Atherosclerotic heart disease of native coronary artery without angina pectoris: Secondary | ICD-10-CM

## 2011-01-29 DIAGNOSIS — E785 Hyperlipidemia, unspecified: Secondary | ICD-10-CM

## 2011-02-06 NOTE — Assessment & Plan Note (Signed)
Summary: 6 MONTH F/U/SAB  Medications Added CARVEDILOL 12.5 MG TABS (CARVEDILOL) Take one tablet by mouth twice a day SIMVASTATIN 40 MG TABS (SIMVASTATIN) Take one tablet by mouth daily at bedtime FUROSEMIDE 40 MG TABS (FUROSEMIDE) Take one tablet by mouth daily. LOSARTAN POTASSIUM 100 MG TABS (LOSARTAN POTASSIUM) 1 by mouth once daily TAMSULOSIN HCL 0.4 MG CAPS (TAMSULOSIN HCL) 1 tablet once daily METFORMIN HCL 500 MG TABS (METFORMIN HCL) 1 tablet once daily      Allergies Added:   Visit Type:  Follow-up Primary Provider:  Dr. Suzie Portela  CC:  c/o right shoulder and arm pain for the past 3 days. denies SOB and chest pain.Marland Kitchen  History of Present Illness: [name] is a very pleasant 70 year old gentleman with a past medical history of coronary artery disease, bypass surgery, mitral valve replacement with prosthetic valve on Coumadin, history of chronic atrial fibrillation, diabetes, hypertension, hyperlipidemia, PNA in 03/2010, who presents for routine followup.  Overall he is doing well. He does have insomnia. Also has right shoulder pain at times. He has nocturia from his prostate cancer. He reports his diabetes has been well-controlled. He does have an occasional wheeze. He did smoke for a prolonged period of time and takes an inhaler p.r.n. No shortness of breath or chest pain.  [name] had his last stress test in 2009, that showed ejection fraction 43%, inferior wall infarct with no ischemia. MV was functioning well.    echocardiogram done recently confirms prosthetic mitral valve is working well with no significant regurgitation, ejection fraction 40-45% with inferior hypokinesis.  EKG shows atrial fibrillation with rate 74 beats per minute, no significant ST or T wave changes, possible old inferior infarct  Current Medications (verified): 1)  Metoprolol Succinate 50 Mg Xr24h-Tab (Metoprolol Succinate) .... Take One Tablet By Mouth Daily 2)  Simvastatin 40 Mg Tabs (Simvastatin) ....  Take One Tablet By Mouth Daily At Bedtime 3)  Aspirin 81 Mg Tbec (Aspirin) .... Take One Tablet By Mouth Daily 4)  Furosemide 40 Mg Tabs (Furosemide) .... Take One Tablet By Mouth Daily. 5)  Losartan Potassium 100 Mg Tabs (Losartan Potassium) .Marland Kitchen.. 1 By Mouth Once Daily 6)  Digoxin 0.125 Mg Tabs (Digoxin) .... Take One Tablet By Mouth Daily 7)  Acetaminophen 500 Mg  Caps (Acetaminophen) .... As Needed 8)  Warfarin Sodium 2.5 Mg Tabs (Warfarin Sodium) .... Use As Directed By Anticoagualtion Clinic 9)  Vitamin D3 1000 Unit Caps (Cholecalciferol) .Marland Kitchen.. 1 Tab By Mouth Once Daily 10)  Uloric 40 Mg Tabs (Febuxostat) .... Take 1 By Mouth Once Daily As Needed 11)  Tamsulosin Hcl 0.4 Mg Caps (Tamsulosin Hcl) .Marland Kitchen.. 1 Tablet Once Daily 12)  Metformin Hcl 500 Mg Tabs (Metformin Hcl) .Marland Kitchen.. 1 Tablet Once Daily  Allergies (verified): 1)  ! Sulfa 2)  ! Codeine 3)  ! * Tylenol 3  Past History:  Past Medical History: Last updated: 05/01/2009 ATRIAL FIBRILLATION (ICD-427.31) HYPERTENSION, UNSPECIFIED (ICD-401.9) HYPERKALEMIA (ICD-276.7) SYSTOLIC HEART FAILURE, ACUTE (ICD-428.21) CAD, ARTERY BYPASS GRAFT (ICD-414.04)    Family History: Last updated: 05/01/2009 Family History of Coronary Artery Disease:  Family History of CVA or Stroke:   Social History: Last updated: 05/01/2009 Tobacco Use - Former. - 1989 Drug Use - no Retired  Married   Risk Factors: Smoking Status: quit (05/01/2009)  Review of Systems  The patient denies fever, weight loss, weight gain, vision loss, decreased hearing, hoarseness, chest pain, syncope, dyspnea on exertion, peripheral edema, prolonged cough, abdominal pain, incontinence, muscle weakness, depression, and  enlarged lymph nodes.         insomnia, right shoulder pain, nocturia  Vital Signs:  Patient profile:   70 year old male Height:      67 inches Weight:      180.50 pounds BMI:     28.37 Pulse rate:   74 / minute BP sitting:   144 / 89  (left arm) Cuff  size:   regular  Vitals Entered By: Lysbeth Galas CMA (January 29, 2011 9:54 AM)  Physical Exam  General:  well-appearing gentleman in no apparent distress, HEENT exam is benign, oropharynx is clear, neck is supple with no JVP or carotid bruits, heart sounds are regular with S1-S2 and no murmurs, click apreciated, lungs are clear to auscultation with no wheezes Rales, abdominal exam is benign with no widened aortic pulsation, no significant lower extremity edema, neurologic exam is grossly nonfocal, skin is warm and dry. Pulses are equal and symmetrical in his upper and lower extremities. Neurologic:  Alert and oriented x 3. Skin:  Intact without lesions or rashes. Psych:  Normal affect.   Impression & Recommendations:  Problem # 1:  MITRAL VALVE REPLACEMENT, HX OF (ICD-V15.1) mitral valve is working well on recent echocardiogram. He is anticoagulated and therapeutic.  Problem # 2:  HYPERLIPIDEMIA-MIXED (ICD-272.4) cholesterol is slightly above goal based on lipids from August of last year. We have encouraged him to lose some weight and watch his diet.  His updated medication list for this problem includes:    Simvastatin 40 Mg Tabs (Simvastatin) .Marland Kitchen... Take one tablet by mouth daily at bedtime  Problem # 3:  HYPERTENSION, UNSPECIFIED (ICD-401.9) Blood pressure is borderline elevated. He has complained about the cost of metoprolol succinate. Given his mildly depressed systolic function, we'll change him to carvedilol 12.5 mg b.i.d..  His updated medication list for this problem includes:    Carvedilol 12.5 Mg Tabs (Carvedilol) .Marland Kitchen... Take one tablet by mouth twice a day    Aspirin 81 Mg Tbec (Aspirin) .Marland Kitchen... Take one tablet by mouth daily    Furosemide 40 Mg Tabs (Furosemide) .Marland Kitchen... Take one tablet by mouth daily.    Losartan Potassium 100 Mg Tabs (Losartan potassium) .Marland Kitchen... 1 by mouth once daily  Problem # 4:  CAD, ARTERY BYPASS GRAFT (ICD-414.04) No symptoms of angina at this time. No  further testing ordered.  His updated medication list for this problem includes:    Carvedilol 12.5 Mg Tabs (Carvedilol) .Marland Kitchen... Take one tablet by mouth twice a day    Aspirin 81 Mg Tbec (Aspirin) .Marland Kitchen... Take one tablet by mouth daily    Warfarin Sodium 2.5 Mg Tabs (Warfarin sodium) ..... Use as directed by anticoagualtion clinic  Other Orders: EKG w/ Interpretation (93000)  Patient Instructions: 1)  Your physician recommends that you schedule a follow-up appointment in: 6 months 2)  Your physician has recommended you make the following change in your medication: STOP Metoprolol. START Carvedilol 12.5mg  two times a day. Prescriptions: SIMVASTATIN 40 MG TABS (SIMVASTATIN) Take one tablet by mouth daily at bedtime  #90 x 3   Entered by:   Lanny Hurst RN   Authorized by:   Dossie Arbour MD   Signed by:   Lanny Hurst RN on 01/29/2011   Method used:   Electronically to        [facility]* (mail-order)       [address] [place]  [address]  Ph: [phone]       Fax: [phone]   RxID:   [id number] DIGOXIN 0.125 MG TABS (DIGOXIN) Take one tablet by mouth daily  #90 x 3   Entered by:   Lanny Hurst RN   Authorized by:   Dossie Arbour MD   Signed by:   Lanny Hurst RN on 01/29/2011   Method used:   Electronically to        [facility]* (mail-order)       [address]       [place], [place]  [address]       Ph: [phone]       Fax: [phone]   RxID:   [id number] WARFARIN SODIUM 2.5 MG TABS (WARFARIN SODIUM) Use as directed by Anticoagualtion Clinic  #90 x 3   Entered by:   Lanny Hurst RN   Authorized by:   Dossie Arbour MD   Signed by:   Lanny Hurst RN on 01/29/2011   Method used:   Electronically to        [facility]* (mail-order)       [address]       [place], [place]  [address]       Ph: [phone]       Fax: [phone]   RxID:   [id number] VPXTGGYI POTASSIUM 100 MG TABS (LOSARTAN  POTASSIUM) 1 by mouth once daily  #90 x 3   Entered by:   Lanny Hurst RN   Authorized by:   Dossie Arbour MD   Signed by:   Lanny Hurst RN on 01/29/2011   Method used:   Electronically to        [facility]* (mail-order)       [address]       [place], [place]  [address]       Ph: [phone]       Fax: [phone]   RxID:   [id number] FUROSEMIDE 40 MG TABS (FUROSEMIDE) Take one tablet by mouth daily.  #90 x 3   Entered by:   Lanny Hurst RN   Authorized by:   Dossie Arbour MD   Signed by:   Lanny Hurst RN on 01/29/2011   Method used:   Electronically to        [facility]* (mail-order)       [address]       [place], [place]  [address]       Ph: [phone]       Fax: [phone]   RxID:   [id number] CARVEDILOL 12.5 MG TABS (CARVEDILOL) Take one tablet by mouth twice a day  #180 x 3   Entered by:   Lanny Hurst RN   Authorized by:   Dossie Arbour MD   Signed by:   Lanny Hurst RN on 01/29/2011   Method used:   Electronically to        [facility]* (mail-order)       [address]       [place], [place]  [address]       Ph: [phone]       Fax: [phone]   RxID:   [id number]

## 2011-02-12 ENCOUNTER — Encounter (INDEPENDENT_AMBULATORY_CARE_PROVIDER_SITE_OTHER): Payer: Medicare Other

## 2011-02-12 ENCOUNTER — Encounter: Payer: Self-pay | Admitting: Cardiovascular Disease

## 2011-02-12 DIAGNOSIS — Z7901 Long term (current) use of anticoagulants: Secondary | ICD-10-CM

## 2011-02-12 DIAGNOSIS — I4891 Unspecified atrial fibrillation: Secondary | ICD-10-CM

## 2011-02-12 DIAGNOSIS — R079 Chest pain, unspecified: Secondary | ICD-10-CM

## 2011-02-12 DIAGNOSIS — I059 Rheumatic mitral valve disease, unspecified: Secondary | ICD-10-CM

## 2011-02-16 ENCOUNTER — Emergency Department: Payer: Self-pay | Admitting: Emergency Medicine

## 2011-02-18 NOTE — Assessment & Plan Note (Signed)
Summary: Atypical Chest Pain  Nurse Visit   Vital Signs:  Patient profile:   70 year old male Height:      67 inches Weight:      180.50 pounds BMI:     28.37 Pulse rate:   68 / minute Pulse rhythm:   irregular BP sitting:   142 / 80  (left arm) Cuff size:   regular CC: Atypical chest pain Pain Assessment Patient in pain? yes     Location: chest Type: soreness Onset of pain  Gradual, began 1 week ago Comments Pt in for his coumadin visit today, and notified RN that he was having "pain on left side, mid-axilla at nipple line, and radiates to left side of chest." Pt stated that last night it was his Right side that was hurting and this pain radiated up his arm to his neck. Pt c/o DOE, no SOB at time of visit. Pt states this pain/soreness developed about 1 week ago, after his last ov with Dr. Mariah Milling. Pt denies any activity recently where he could have pulled a muscle. At last ov, pt's metoprolol was d/c'd and changed to carvedilol 12.5mg  two times a day. Pt states he has been taking as directed.   Pt has hx of CAD, CABG '89, s/p mitral valve replacement, chronic afib on coumadin therapy. Dr. Mariah Milling had suggested recently to pt having echo and pt wanted to postpone, as it has been 2 years since his last echo. EKG done today. Dr. Mariah Milling to compare to last EKG. RN will call pt with recommendation @ [phone].   Allergies: 1)  ! Sulfa 2)  ! Codeine 3)  ! * Tylenol 3  Appended Document: Atypical Chest Pain EKG unchanged. I called patient and left a message to call back if pain does not resolve. He could try advil to see if this helps.

## 2011-02-18 NOTE — Medication Information (Signed)
Summary: ROV/AMD  Anticoagulant Therapy  Managed by: Cloyde Reams, RN, BSN Referring MD: Valera Castle PCP: Dr. Reino Bellis MD: Mariah Milling Indication 1: Atrial Fibrillation (ICD-427.31) Indication 2: Mitral Valve Replacement (ICD-V43.3) Lab Used: [facility]  Site: [place] INR POC 2.1  INR RANGE 2.5-3.5  Dietary changes: no    Health status changes: yes       Details: Pt c/ochest pain x 3-4 days, at breast line on R and L sides.  Comes and goes, sharp pains at times.  Radiates at times up neck.  Denies nausea, vomiting, diaphorisis, SOB assoc. with.    Bleeding/hemorrhagic complications: no    Recent/future hospitalizations: no    Any changes in medication regimen? yes       Details: Stopped Metoprolol and started on Carveidolol.   Recent/future dental: no  Any missed doses?: no       Is patient compliant with meds? yes       Allergies: 1)  ! Sulfa 2)  ! Codeine 3)  ! * Tylenol 3  Anticoagulation Management History:      The patient is taking warfarin and comes in today for a routine follow up visit.  Positive risk factors for bleeding include an age of 72 years or older and presence of serious comorbidities.  The bleeding index is 'intermediate risk'.  Positive CHADS2 values include History of CHF, History of HTN, and History of Diabetes.  Negative CHADS2 values include Age > 56 years old.  The start date was 03/16/2006.  His last INR was 2.0.  Anticoagulation responsible provider: Yeraldy Spike.  INR POC: 2.1 .  Cuvette Lot#: [id number].  Exp: 12/2011.    Anticoagulation Management Assessment/Plan:      The patient's current anticoagulation dose is Warfarin sodium 2.5 mg tabs: Use as directed by Anticoagualtion Clinic.  The target INR is 2.5 - 3.5.  The next INR is due 03/05/2011.  Anticoagulation instructions were given to patient.  Results were reviewed/authorized by Cloyde Reams, RN, BSN.  He was notified by Cloyde Reams RN.           Prior Anticoagulation Instructions: INR 3.7  Start taking 2.5mg  daily.  Recheck in 4 weeks.    Current Anticoagulation Instructions: INR 2.1  Start taking 2.5mg  daily except 5mg  on Wednesdays.  Recheck in 3 weeks.

## 2011-03-05 ENCOUNTER — Encounter: Payer: Self-pay | Admitting: Cardiovascular Disease

## 2011-03-05 ENCOUNTER — Encounter (INDEPENDENT_AMBULATORY_CARE_PROVIDER_SITE_OTHER): Payer: Medicare Other

## 2011-03-05 DIAGNOSIS — I4891 Unspecified atrial fibrillation: Secondary | ICD-10-CM

## 2011-03-05 DIAGNOSIS — Z7901 Long term (current) use of anticoagulants: Secondary | ICD-10-CM

## 2011-03-11 ENCOUNTER — Encounter: Payer: Self-pay | Admitting: Cardiology

## 2011-03-11 DIAGNOSIS — I059 Rheumatic mitral valve disease, unspecified: Secondary | ICD-10-CM

## 2011-03-11 DIAGNOSIS — Z7901 Long term (current) use of anticoagulants: Secondary | ICD-10-CM | POA: Insufficient documentation

## 2011-03-11 DIAGNOSIS — Z9889 Other specified postprocedural states: Secondary | ICD-10-CM

## 2011-03-11 DIAGNOSIS — I4891 Unspecified atrial fibrillation: Secondary | ICD-10-CM

## 2011-03-11 NOTE — Medication Information (Signed)
Summary: rov/ewj  Anticoagulant Therapy  Managed by: Cloyde Reams, RN, BSN Referring MD: Valera Castle PCP: Dr. Reino Bellis MD: Mariah Milling Indication 1: Atrial Fibrillation (ICD-427.31) Indication 2: Mitral Valve Replacement (ICD-V43.3) Lab Used: [facility] Site: [place] INR POC 1.9 INR RANGE 2.5-3.5  Dietary changes: yes       Details: Eaten spinach and broccoli recently.  Health status changes: yes       Details: Pt has been dx with shingles.    Bleeding/hemorrhagic complications: no    Recent/future hospitalizations: no    Any changes in medication regimen? no    Recent/future dental: no  Any missed doses?: no       Is patient compliant with meds? yes       Allergies: 1)  ! Sulfa 2)  ! Codeine 3)  ! * Tylenol 3  Anticoagulation Management History:      The patient is taking warfarin and comes in today for a routine follow up visit.  Positive risk factors for bleeding include an age of 70 years or older and presence of serious comorbidities.  The bleeding index is 'intermediate risk'.  Positive CHADS2 values include History of CHF, History of HTN, and History of Diabetes.  Negative CHADS2 values include Age > 98 years old.  The start date was 03/16/2006.  His last INR was 2.0.  Anticoagulation responsible provider: Alzada Brazee.  INR POC: 1.9.  Cuvette Lot#: [id number].  Exp: 12/2011.    Anticoagulation Management Assessment/Plan:      The patient's current anticoagulation dose is Warfarin sodium 2.5 mg tabs: Use as directed by Anticoagualtion Clinic.  The target INR is 2.5 - 3.5.  The next INR is due 03/26/2011.  Anticoagulation instructions were given to patient.  Results were reviewed/authorized by Cloyde Reams, RN, BSN.  He was notified by Cloyde Reams RN.         Prior Anticoagulation Instructions: INR 2.1  Start taking 2.5mg  daily except 5mg  on Wednesdays.  Recheck in 3 weeks.    Current Anticoagulation Instructions: INR  1.9  Take 5mg  today and tomorrow, then resume same dosage 2.5mg  daily except 5mg  on Wednesdays.  Recheck in 3 weeks.

## 2011-03-26 ENCOUNTER — Ambulatory Visit (INDEPENDENT_AMBULATORY_CARE_PROVIDER_SITE_OTHER): Payer: Medicare Other | Admitting: Emergency Medicine

## 2011-03-26 DIAGNOSIS — I4891 Unspecified atrial fibrillation: Secondary | ICD-10-CM

## 2011-03-26 DIAGNOSIS — Z9889 Other specified postprocedural states: Secondary | ICD-10-CM

## 2011-03-26 DIAGNOSIS — I059 Rheumatic mitral valve disease, unspecified: Secondary | ICD-10-CM

## 2011-03-26 DIAGNOSIS — Z7901 Long term (current) use of anticoagulants: Secondary | ICD-10-CM

## 2011-03-26 NOTE — Patient Instructions (Signed)
Start taking 1 tablet daily except 2 tablets on Wednesdays and Saturdays.  Recheck in 3 weeks.

## 2011-04-01 LAB — COMPREHENSIVE METABOLIC PANEL
AST: 26 U/L (ref 0–37)
Alkaline Phosphatase: 48 U/L (ref 39–117)
BUN: 38 mg/dL — ABNORMAL HIGH (ref 6–23)
CO2: 28 mEq/L (ref 19–32)
Chloride: 104 mEq/L (ref 96–112)
Creatinine, Ser: 2.09 mg/dL — ABNORMAL HIGH (ref 0.4–1.5)
GFR calc Af Amer: 38 mL/min — ABNORMAL LOW (ref >60)
GFR calc non Af Amer: 32 mL/min — ABNORMAL LOW (ref >60)
Total Bilirubin: 0.7 mg/dL (ref 0.3–1.2)

## 2011-04-01 LAB — CBC
HCT: 41.8 % (ref 39.0–52.0)
MCV: 94.8 fL (ref 78.0–100.0)
RBC: 4.4 MIL/uL (ref 4.22–5.81)
WBC: 5.7 10*3/uL (ref 4.0–10.5)

## 2011-04-01 LAB — APTT: aPTT: 52 seconds — ABNORMAL HIGH (ref 24–37)

## 2011-04-16 ENCOUNTER — Ambulatory Visit (INDEPENDENT_AMBULATORY_CARE_PROVIDER_SITE_OTHER): Payer: Medicare Other | Admitting: Emergency Medicine

## 2011-04-16 DIAGNOSIS — I4891 Unspecified atrial fibrillation: Secondary | ICD-10-CM

## 2011-04-16 DIAGNOSIS — Z7901 Long term (current) use of anticoagulants: Secondary | ICD-10-CM

## 2011-04-16 DIAGNOSIS — Z9889 Other specified postprocedural states: Secondary | ICD-10-CM

## 2011-04-16 DIAGNOSIS — I059 Rheumatic mitral valve disease, unspecified: Secondary | ICD-10-CM

## 2011-04-22 ENCOUNTER — Telehealth: Payer: Self-pay | Admitting: Cardiovascular Disease

## 2011-04-22 NOTE — Telephone Encounter (Signed)
Needs surgical clearance for a balloon dialation of a urethral stricture.  Needs to stop coumadin as well. Fax # [phone]

## 2011-04-22 NOTE — Telephone Encounter (Signed)
Pt last seen in office 01/29/11 by Dr. Mariah Milling. Pt is on coumadin for s/p mitral valve replacement and a fib; EKG showed a fib at last ov. Is pt cleared for surgery with Dr. Patsi Sears, and do you want me to have coumadin clinic bridge with Lovenox? Please advise.

## 2011-04-23 NOTE — Telephone Encounter (Signed)
We will need to do lovenox bridge for 5 days before, 5 days after with close check of INR after. No lovenox the morning of procedure and restart lovenox the night of the procedure Lovenox 1 mg/kg twice a day

## 2011-04-23 NOTE — Telephone Encounter (Signed)
Spoke to [facility], did not have pt on schedule that she could see, I have left msg for them to call back if he has been scheduled. Please call pt for dosing. Thanks.

## 2011-05-06 NOTE — Letter (Signed)
March 06, 2009    Sigmund I. Patsi Sears, M.D.  [address]  [place], [place] [address]   RE:  [name], [name]  MRN:  [id number]  /  DOB:  Dec 23, 1940   Dear Carol Ada:   Thank you for calling me about Ashad Fawbush.  I spoke to Dr. Gaynelle Arabian  about 2 weeks ago concerning Akiem's prostate cancer.   After discussing his situation with Dr. Earlene Plater, we both felt he was too  high risk for total prostatectomy.  His bleeding risk will be  substantial particularly with the necessity of having Lovenox overlap.   I have discussed him with you, as well as his wife in the office today,  I think seed implantation with low-dose radiation is the best strategy.  His operative risk is low for this procedure.  He will need overlap with  Lovenox, which we will be happy to arrange through our office here in  [place].  Please let us know when this procedure will be scheduled.    Sincerely,      Jesse Sans. Daleen Squibb, MD, [facility]  Electronically Signed    TCW/MedQ  DD: 03/06/2009  DT: 03/07/2009  Job #: [id number]   CC:    [name] Nones, MD at [facility]

## 2011-05-06 NOTE — Assessment & Plan Note (Signed)
[facility] OFFICE NOTE   [name], [name]                         MRN:          [id number]  DATE:03/13/2009                            DOB:          1941/08/13    [name] comes in today for a followup.  He was suppose to come in 6  months, but it has been about a year since we have seen.   He has no cardiac complaints including orthopnea, PND, peripheral edema,  dyspnea on exertion, angina, tachy palpitations, syncope, or presyncope.   PROBLEM LIST:  1. Coronary artery disease, status post coronary artery bypass grafts      were patent in 2007.  He had a negative stress Myoview on March 28, 2008, ejection fraction 43% with inferior lateral wall infarct, but      no ischemia.  2. History of inferolateral wall infarct.  3. History of acute systolic heart failure with severe mitral      regurgitation, status post coronary artery bypass grafting and      prosthetic metal mitral valve replacement at [facility] in May 2007.  4. History of mild hyperkalemia secondary to taking potassium and anti-      inflammatories.  5. Hypertension.  6. History of mild left ventricular dysfunction.  His last ejection      fraction was around 45-50% on echo, March 28, 2008.  His mitral      valve prosthesis was stable with trivial mitral regurgitation.      Left atrial size was 59 mm.  7. Chronic atrial fibrillation.  8. Anticoagulation.   He now has prostate cancer and had discussions with Dr. Jethro Bolus and also Dr. Gaynelle Arabian of [facility].  We thought it  was too high risk for radical prostatectomy and he would really prefer  to have the seed implant at low-dose radiation.  This is going to occur  in the near future.  He will need Lovenox and Coumadin overlap after  seed implantation.   His meds are outlined in the chart include:  1. Vytorin 10/20 daily.  2. Enteric-coated aspirin 81 mg a day.  3. Lasix 40  mg a day.  4. Toprol-XL 50 mg a day.  5. Diovan 80 mg p.o. b.i.d.  6. Lanoxin 0.125 mg p.o. daily.  7. Coumadin as directed.  8. He carries sublingual nitroglycerin.   PHYSICAL EXAMINATION:  VITAL SIGNS:  His blood pressure is 140/80, his  pulse is 65 and irregular.  His weight is 184, which is up 5.  GENERAL:  He is in no acute distress.  SKIN:  Warm and dry.  NECK:  No major JVD.  Carotid upstrokes were equal bilateral without  bruits.  Thyroid is not enlarged.  Trachea is midline.  CHEST:  Lungs are clear to auscultation and percussion.  HEART:  A nondisplaced PMI.  He has no systolic murmur, irregular rate,  and rhythm.  Prosthetic S1.  ABDOMEN:  Soft.  Good bowel sounds.  No midline or pulsatile mass.  EXTREMITIES:  No  cyanosis, clubbing, or edema.  Pulses are intact.  NEUROLOGIC:  Intact.   His EKG confirms atrial fib with an RSR prime at V1.  Old inferior wall  infarct, which is stable.  There are no change from previous ECG.   ASSESSMENT AND PLAN:  [name] is doing well and is stable from our  standpoint.  He will need Lovenox overlap after his seed implantation.  I have made it clear to him as well as to [facility] that we need  to know the date of that implantation so that our Coumadin Clinic here  in [place] can manage.  Otherwise, I will plan on seeing him back in  6 months.     Thomas C. Daleen Squibb, MD, [facility]  Electronically Signed    TCW/MedQ  DD: 03/13/2009  DT: 03/14/2009  Job #: [id number]   cc:   Lynelle Smoke I. Patsi Sears, M.D.

## 2011-05-06 NOTE — Op Note (Signed)
NAMEJULEZ, HUSEBY                  ACCOUNT NO.:  [id number]   MEDICAL RECORD NO.:  [id number]          PATIENT TYPE:  AMB   LOCATION:  [facility]                         FACILITY:  [facility]   PHYSICIAN:  Sigmund I. Patsi Sears, M.D.DATE OF BIRTH:  Apr 18, 1941   DATE OF PROCEDURE:  05/25/2009  DATE OF DISCHARGE:                               OPERATIVE REPORT   PREOPERATIVE DIAGNOSES:  T1C adenocarcinoma of the prostate, Gleason 7,  prostate-specific antigen 42.   POSTOPERATIVE DIAGNOSES:  T1C adenocarcinoma of the prostate, Gleason 7,  prostate-specific antigen 42.   OPERATION:  Cystourethroscopy and implantation of iodine seed, post  external beam radiation therapy.   SURGEON:  Jethro Bolus, M.D.   RADIATION THERAPIST:  Dr. Margaretmary Dys.   PREPARATION:  After appropriate preanesthesia, the patient was brought  to the operating room, placed on the operating room table in dorsal  supine position, where general LMA anesthesia was induced.  He was then  replaced in dorsal lithotomy position, where the pubis was prepped with  Betadine solution and draped in usual fashion.   REVIEW OF HISTORY:  This 70 year old male has a history of Gleason 7,  T1C adenocarcinoma of the prostate, with PSA of 42 and negative  metastatic survey.  The patient was offered multiple treatment options,  elected external beam radiation therapy, followed by the placement of  I25 seed treatment.  He was seen in second opinion by Dr. Earlene Plater, and  also by Dr. Margaretmary Bayley.  He is undergoing cardiac bridging, with  anticoagulants per Dr. Juanito Doom.   PROCEDURE:  With the patient in dorsal lithotomy position, the prostate  is again ultrasound and, following treatment planning, the patient  undergoes delivery of 110 Lahman radiation, with 24 needles, and a total  of 81 activated seeds (125).  The patient tolerated procedure very well.  Cystoscopy at the end of the procedure showed that there  were no seeds within the  bladder or the urethra.  The prostate was  hyperemic, but otherwise within normal limits.  Foley catheter was  placed, B and O suppository given, and IV Toradol given.  The patient  was awakened and taken to the recovery room in good condition.  Foley  catheter was left to straight drainage.      Sigmund I. Patsi Sears, M.D.  Electronically Signed     SIT/MEDQ  D:  05/25/2009  T:  05/25/2009  Job:  [id number]   cc:   Artist Pais Kathrynn Running, M.D.  Fax: [phone]   Jesse Sans. Wall, MD, FACC  [address]  [address]  [place]  [place] [address]

## 2011-05-06 NOTE — Assessment & Plan Note (Signed)
[facility] OFFICE NOTE   [name], [name]                         MRN:          [id number]  DATE:02/09/2008                            DOB:          [birth year]    [name] returns today for further management of the following issues:  1. Coronary disease status post coronary artery bypass grafting.  Last      catheterization May 2007; his bypass grafts were patent.  2. Chronic atrial fibrillation.  3. Anticoagulation.  4. History of mitral valve prolapse with severe mitral regurgitation      and heart failure.  He is status post prosthetic mitral valve      replacement at [facility] May 2007.  5. Hypertension.  6. Mild hyperkalemia.  He was told to stop his potassium last time,      but he still on it.  7. History of moderate left ventricular systolic function.   He denies any orthopnea, PND, chest discomfort.  His dyspnea on exertion  is stable.  His weight is up 4 pounds to 184.   He lists indomethacin for his gout, but he only takes it p.r.n.  I have  told him to take this as little as possible with his history of heart  failure, not to mention hyperkalemia.   He would like to establish in the Coumadin Clinic here for ease.  He  currently goes to [place] office.   His current medications are:  1. Coumadin 2.5 mg a day.  2. Vytorin 10/20 daily.  3. Enteric-coated aspirin 81 mg day.  4. Lasix 40 mg a day.  5. Toprol XL 50 mg a day.  6. Diovan 80 mg p.o. b.i.d.  7. Potassium 20 mEq a day.  8. Indomethacin p.r.n.  9. Colchicine p.r.n.  10.Lanoxin 0.125 daily.   PHYSICAL EXAMINATION:  Blood pressure 142/78.  His pulse is 80 and  irregular.  EKG confirms atrial fibrillation with a RSR prime in V1/V2  which is unchanged.  He has nonspecific ST-segment changes which are  unchanged.  His weight is 184, up 4 pounds.  He is in no acute distress.  Skin is warm and dry.  Alert and oriented x3.  HEENT:   Normocephalic, atraumatic.  PERRL.  Extraocular movements  intact.  Sclerae are slightly injected.  Facial symmetry is normal.  NECK:  Is supple.  Carotid upstrokes were equal bilaterally without  bruits.  No JVD.  Thyroid is not enlarged.  Trachea is midline.  LUNGS:  Are clear to auscultation.  HEART:  Reveals an irregular rate and rhythm.  Prosthetic S1.  No  gallop.  ABDOMINAL EXAM:  Soft with good bowel sounds.  No midline bruit.  Organomegaly was difficult to assess with his weight.  EXTREMITIES:  Revealed no edema.  Pulses are intact.  NEUROLOGICAL EXAM:  Is intact.  SKIN:  Shows a few ecchymoses.   He is a nonsmoker.   ASSESSMENT/PLAN:  [name] seems to be stable.  I have advised to keep his  weight down.  We will enroll him in the Coumadin  clinic here for ease of  transportation.  I have also asked him to stop his potassium.  When he  comes for his first protime, we will check a fasting CMET and lipids.   He will need to see [place] back in 6 months.  At that time, we will obtain  an adenosine Myoview as well as a 2-D echo to follow-up on his  prosthetic valve.     Thomas C. Daleen Squibb, MD, [facility]  Electronically Signed    TCW/MedQ  DD: 02/09/2008  DT: 02/10/2008  Job #: [id number]   cc:   Worthy Keeler, M.D.

## 2011-05-06 NOTE — Assessment & Plan Note (Signed)
[facility]                            CARDIOLOGY OFFICE NOTE   NAME:[name], [name]                         MRN:          [id number]  DATE:03/21/2008                            DOB:          [birth year]    [name] comes in today because of some pain up in his neck and right  shoulder down into his upper chest.   He his he is been doing a lot of outdoor work including weed eating.  He  does not wear a shoulder strap.  This started about 8 to 9 days ago.  He  has been taking a lot of over-the-counter nonsteroidals.  He is on  Coumadin and has a history of heart failure, which I warned him about  today.   He denies any exertional chest pain, though he does have chronic dyspnea  on exertion.   PROBLEM LIST:  1. Includes coronary disease status post coronary bypass grafting.      His bypass grafts were patent in May 2007.  He is due a stress test      this year.  2. History of mitral valve prolapse with severe mitral regurgitation      and heart failure.  He is status post prosthetic mitral valve      replacement at [facility] May 2007.  3. Hypertension.  4. History of mild hypokalemia, which was probably due to taking      potassium and also anti-inflammatories.  5. History of moderate left ventricular systolic dysfunction.  His      last ejection fraction had improved by 2-D echo though not      quantitated.  6. Chronic atrial fib.  7. Anticoagulation.   He is he denies any orthopnea, PND or peripheral edema.  He has had no  palpitations, syncope, or presyncope.   CURRENT MEDS:  1. Vytorin 10/20 nightly.  2. Enteric-coated aspirin 81 mg a day.  3. Lasix 40 mg a day.  4. Toprol XL 50 mg a day.  5. Diovan 80 mg p.o. b.i.d.  6. Lanoxin 0.125 mg daily.  7. Coumadin as directed.  8. He takes colchicine p.[name].n.  9. He promises he is not taking indomethacin anymore.   EXAM:  Today his blood pressure 140/86, his pulse is 88 and irregular.  His weight is  179 down 5.  He is in no acute distress.  Alert and oriented x3.  HEENT:  He has a little bit of a tanned complexion.  PERRLA.  Extraocular intact.  Sclerae are clear.  Face symmetry is normal.  Neck is supple.  Carotids upstrokes were equal bilateral without bruits.  There is no JVD.  Thyroid is not enlarged.  He has some tenderness over  the right side of his neck and right shoulder to palpation.  He has got good range of motion of his upper extremities.  He has good  pulses in both upper extremities.  His lungs were clear to auscultation.  There are no rubs.  HEART:  Reveals a nondisplaced PMI.  He has a prosthetic S1 and a split  S2.  Has an irregular rate and rhythm.  ABDOMEN:  Soft, good bowel sounds.  No tenderness.  EXTREMITIES:  No edema.  Pulses are intact.  NEURO:  Exam is intact.  SKIN:  Unremarkable except for the tanned complexion.  MUSCULOSKELETAL:  Chronic arthritic changes.   ASSESSMENT:  1. Probable musculoskeletal pain from over use of his weed eater.  2. Coronary artery disease, due objective assessment for myocardial      ischemia.  3. History of mitral valve prosthesis.  4. History of moderate left ventricular systolic dysfunction, needs to      be reassessed.  5. History of hyperkalemia.  With all the anti-inflammatories he is      taking, and his being on Diovan, I am concerned that we need to      recheck his potassium.   PLAN:  1. Check Chem-7 today.  2. No more nonsteroidals, including indomethacin.  3. Tylenol p.[name].n. for pain.  4. Local heat to his neck and shoulder.  5. Attach strap to his weed eater, but avoid weed eating as much as he      can in the next week to 2 weeks to let this recover.   We will obtain a is an adenosine Myoview as well as a 2-D  echocardiogram.  Assuming these are stable.  Will plan on seeing him  back in 6 months.     Thomas C. Daleen Squibb, MD, [facility]  Electronically Signed    TCW/MedQ  DD: 03/21/2008  DT: 03/21/2008  Job #:  [id number]

## 2011-05-07 MED ORDER — ENOXAPARIN SODIUM 80 MG/0.8ML ~~LOC~~ SOLN: 80.0000 mg | Freq: Two times a day (BID) | SUBCUTANEOUS | Status: DC

## 2011-05-07 NOTE — Telephone Encounter (Signed)
Instructions reviewed by Weston Brass, Pharm D, rx Enoxaparin 80mg  bid sent to [facility]. Pt aware rx sent to pharmacy. Copy of Instructions mailed to pt.

## 2011-05-07 NOTE — Telephone Encounter (Signed)
Called spoke with pt advised per Dr Mariah Milling we will hold Coumadin 5 days prior to procedure and bridge both before and after procedure.  Advised pt last dose of Coumadin will be on 05/09/11.  No Coumadin or Lovenox on 05/10/11, start Lovenox am of 05/11/11 and continue every 12 hours (twice daily). Last dose of Lovenox will be on 05/14/11 in the am.  No Lovenox on 05/14/11 in the pm prior to procedure on 05/15/11.  Resume Coumadin on 05/15/11, take 5mg  , and resume Lovenox in the am on 05/16/11 if OK with urologist.  Continue on Lovenox injections every 12 hours (twice daily) and Coumadin daily at previous dosage until Coumadin check on 05/20/11.

## 2011-05-09 NOTE — H&P (Signed)
[name], [name]                  ACCOUNT NO.:  [id number]   MEDICAL RECORD NO.:  [id number]          PATIENT TYPE:  OIB   LOCATION:                               FACILITY:  [facility]   PHYSICIAN:  Thomas C. Wall, M.D.   DATE OF BIRTH:  1941/02/09   DATE OF ADMISSION:  11/27/2004  DATE OF DISCHARGE:                                HISTORY & PHYSICAL   CHIEF COMPLAINT:  Patient being admitted for cardiac catheterization.   HISTORY OF PRESENT ILLNESS:  This is a pleasant 70 year old male followed by  Dr. Daleen Squibb in our practice.  He has no primary care physician.  He has a  history of coronary artery disease with previous coronary artery bypass  graft surgery in 1989 performed at [facility] with the  LIMA to the LAD and a saphenous vein graft to the obtuse marginal at that  time.  The patient also has a history of left ventricular dysfunction and he  has had subsequent percutaneous interventions performed since his bypass  surgery at [facility].  He had a cardiac catheterization  in 2002 performed by Dr. Riley Kill, at that time he had a patent LIMA to the  LAD and a patent saphenous vein graft to the obtuse marginal, he had  progressive disease of the native right coronary artery, I believe he had an  intervention during that admission however, I do not have that dictation  available at this time.   The patient recently had a Cardiolite performed November 18, 2004 as part of  his routine followup for his coronary disease, this was positive for  ischemia although his ejection fraction had improved to 55%.  Based on these  findings, Dr. Daleen Squibb has recommended that the patient undergo cardiac  catheterization for further evaluation.   PAST MEDICAL HISTORY:  As noted the patient had coronary artery bypass graft  surgery in 1989 with the LIMA to the LAD, saphenous vein graft to the obtuse  marginal, he has had previous history of left ventricular dysfunction  and  nonsustained VT, he was evaluated in the past by Dr. Ladona Ridgel however, a  defibrillator was not felt to be indicated at that time.  The patient had a  cardiac catheterization in 2002 performed by Dr. Riley Kill at which time he  was found to have patent grafts but a 75% narrowing of the native RCA, I  believe he underwent intervention at that time however, I do not have the  details available.  The patient also has a history of hyperlipidemia.  He  has a history of hypertension.   ALLERGIES:  Patient is allergic to CODEINE and SULFA.  There is also a  question of ALTACE intolerance.   CURRENT MEDICATIONS INCLUDE:  1.  Aspirin 325 mg daily.  2.  Lipitor 10 mg daily.  3.  Coreg 12.5 mg b.i.d.  4.  Diovan 160 mg daily.   SOCIAL HISTORY:  The patient is married.  He lives in [place].  He has  three children.  He quit smoking in 1989.  He  drinks beer occasionally.  He  still works in his own [employer].   FAMILY HISTORY:  His mother died at age 49 from multiple MIs.  His father  died at age 2 from an MI.  He has two brothers both of whom have heart  trouble.  He has four sisters.   REVIEW OF SYSTEMS:  Review of systems is completely negative except for  wheezing x3-4 weeks question of a recent upper respiratory tract infection.   PHYSICAL EXAMINATION:  GENERAL:  Pleasant 70 year old white male in no acute  distress.  VITAL SIGNS:  Blood pressure 161/80, pulse was 70, weight 199 pounds.  HEENT:  Unremarkable.  NECK:  No bruits.  No jugular venous distention.  HEART:  Regular rate and rhythm with a high pitch grade 2/6 systolic murmur  at the left sternal border.  LUNGS:  Clear.  ABDOMEN:  Obese, soft, nontender.  EXTREMITIES:  No significant edema.  Pulses are intact.  SKIN:  Warm and dry.   LABORATORY DATA:  Pending.   IMPRESSION:  1.  Abnormal Cardiolite performed November 19, 2003 positive for ischemia,      ejection fraction 55% but no recent cardiac  symptoms.  2.  History of coronary artery disease with coronary artery bypass graft      surgery in 1989.  3.  Catheterization performed in 2002 by Dr. Riley Kill with patent grafts but      progressive right coronary artery disease with percutaneous coronary      intervention.  4.  History of nonsustained ventricular tachycardia with previous      electrophysiologic evaluation not felt to be appropriate for      defibrillator.  5.  Previous left ventricular dysfunction with subsequent improvement by      recent Cardiolite.  6.  History of elevated lipids.  7.  History of ALTACE intolerance.  8.  History of hypertension.   PLAN:  Plan will be to admit the patient for cardiac catheterization and  intervention if indicated.      Markus.Osmond   DR/MEDQ  D:  11/26/2004  T:  11/26/2004  Job:  [id number]

## 2011-05-09 NOTE — Cardiovascular Report (Signed)
[place]  Patient:    [name], [name]                         MRN: [id number] Proc. Date: 07/09/01 Adm. Date:  [id number] Attending:  Mirian Mo CC:         CV Laboratory  Yates Decamp, M.D.  Thomas C. Wall, M.D. [facility]   Cardiac Catheterization  INDICATIONS:  The patient is a 70 year old, who has had prior bypass surgery in 1989 at [facility].  At that time, he had a left internal mammary to the LAD and saphenous vein graft to the OM.  He has developed some hypotension and dizziness.  However, in talking with his family and with the patient he has had some recurrent chest discomfort.  As a result cardiac catheterization was warranted.  He was brought to the lab for further evaluation.  PROCEDURES: 1. Selective coronary arteriography. 2. Saphenous vein graft angiography x1. 3. Selective left internal mammary x1. 4. Left heart catheterization.  DESCRIPTION OF PROCEDURE:  The procedure was performed from the right femoral artery using 6 French catheters.  He tolerated the procedure well.  No ventriculography was done because of elevated preprocedural creatinine. The creatinine improved prior to the procedure.  However, we wanted to limit dye. The procedure was completed without complication.  HEMODYNAMICS: 1. Central aortic pressure 111/68. 2. Left ventricular 107/27. 3. No aortic LV pressure change.  ANGIOGRAPHIC DATA: 1. The left main coronary artery demonstrates about 70-75% distal    narrowing.  The ostium of the LAD demonstrates 90% narrowing and then    the LAD is totally occluded in the mid vessel. 2. The circumflex was totally occluded proximally. 3. The right coronary artery is a dominant vessel with the posterior    descending and posterolateral branch.  There was about 75% narrowing    near the junction of the proximal and mid vessel.  Distal to this, there    is about 30-40% narrowing.  This vessel was laid out carefully,  and    intracoronary nitroglycerin also administered.  The distal vessel was    widely patent and free of critical disease.  The left internal mammary artery to the distal LAD is widely patent.  The vein graft to the obtuse marginal was widely patent.  CONCLUSIONS: 1. Patent internal mammary to the left anterior descending. 2. Patent saphenous vein graft to the obtuse marginal. 3. Progressive disease in the native right coronary artery.  DISPOSITION:  It is not completely that all the patients symptoms are due to angina.  However, it is worrisome for this.  His family is convinced that it is angina and the patient also thinks it is anginal-type pain.  We will discuss the options but we are leaning towards percutaneous revascularization. We plan to do this on Monday after stabilization of renal function. DD:  07/09/01 TD:  07/10/01 Job: [id number]

## 2011-05-09 NOTE — Cardiovascular Report (Signed)
NAMEHOUSTON, [name] NO.:  [id number]   MEDICAL RECORD NO.:  [id number]          PATIENT TYPE:  OIB   LOCATION:  2890                         FACILITY:  [facility]   PHYSICIAN:  Arturo Morton. Riley Kill, M.D. [facility] OF BIRTH:  1941/09/27   DATE OF PROCEDURE:  11/27/2004  DATE OF DISCHARGE:  11/27/2004                              CARDIAC CATHETERIZATION   INDICATIONS:  Mr. Bishop has had previous bypass surgery at [facility] in 1989.  He  had an internal mammary of the LAD, saphenous vein graft to the OM.  He has  been continuing to do well.  In 2002 we put a non-drug eluting stent into  the right coronary artery prior to the release of the DES stents.  He had  mitral regurgitation at that time with an inferior wall motion abnormality  from his prior infarct.  He had tolerated everything well, continued to do  well.  Cardiolite was done which suggested an inferolateral scar with  possible some ischemia.  Previously had just shown a scar.  Because of that,  he was brought back for catheterization. Risks were discussed with the  patient in detail by Dr. Daleen Squibb.   PROCEDURE:  1.  Left heart catheterization.  2.  Selective coronary arteriography.  3.  Selective left ventriculography.  4.  Saphenous vein graft angiography x1.  5.  Selective left internal mammary angiography x1.   DESCRIPTION OF THE PROCEDURE:  The patient was brought to the cath lab and  prepped and draped in the usual fashion.  Through an anterior puncture the  right femoral artery was easily entered, a 6 French sheath was placed.  The  left and right coronary arteries were easily injected.  Vein graft  angiography and internal mammary angiography were performed.  Central aortic  and left ventricular pressures were measured.  Ventriculography was  performed in the RAO projection.  Of note, we also got an I-STAT potassium  which was normal.  The procedure went without complication.  He was taken to  the holding area  for direct manual hemostasis.   HEMODYNAMIC DATA:  1.  Central aortic pressure 125/64 with mean of 89.  2.  Left ventricle 119/14.  3.  No gradient on pullback across the aortic valve.   ANGIOGRAPHIC DATA:  1.  Ventriculography was performed in the RAO projection and LAO projection.      This revealed inferior severe hypokinesis with 2+ mitral regurgitation.      Likewise, the left ventriculogram demonstrated perhaps mild      inferolateral hypokinesis.  2.  The left main had 80% narrowing tapering distally.  It led into a      severely diseased LAD which is totally occluded and a severely diseased      circumflex which is totally occluded.  3.  The internal mammary to the LAD is widely patent and fills the LAD      antegrade without high grade stenosis.  4.  The previously placed saphenous vein graft to the circumflex system was      widely patent.  There  is some mild tapering in the distal vessels which      are somewhat small in caliber.  5.  The right coronary artery demonstrates a widely patent vessel with no      evidence of restenosis at all at the previous stent site.   IMPRESSION:  1.  Moderate mitral regurgitation with an inferolateral wall motion      abnormality.  2.  Continued patency of the left internal mammary to the left anterior      descending and saphenous vein graft to the obtuse marginal.  3.  Continued patency of the stent site to the right coronary artery.   DISPOSITION:  The patient will have followup with Dr. Daleen Squibb, continued  medical therapy is recommended.       TDS/MEDQ  D:  11/27/2004  T:  11/28/2004  Job:  [id number]   cc:   Yates Decamp  [address]  [place]  [place] [address]  Fax: [phone]   Patient's Medical Record   Jesse Sans. Wall, M.D.   CV Laboratory

## 2011-05-09 NOTE — Discharge Summary (Signed)
[name]  Patient:    [name], [name]                         MRN: [id number] Adm. Date:  [id number] Disc. Date: 07/13/01 Attending:  Mirian [name] Dictator:   [name], N.P. CC:         [name], M.D. [facility]  [name]. [name], M.D. [facility]   Discharge Summary  PRINCIPAL DIAGNOSIS:  Coronary artery disease.  SECONDARY DIAGNOSES: 1. Nonsustained ventricular tachycardia. 2. Hyperlipidemia.  HISTORY OF PRESENT ILLNESS:  This is a 70 year old gentleman with a past medical history of coronary artery disease, status post CABG in 1989, who was admitted on July 09, 2001, after a complaint of dizziness after working in his yard.  The blood pressure in the emergency room at that time was 66/44.  He was noted to be a little dehydrated.  He also had a complaint of angina and left arm pain with occasional dizziness over the past several months.  HOSPITAL COURSE:  The patient was rehydrated.  He underwent a cardiac catheterization which showed progressive disease of the RCA.  He underwent a PTCA of his RCA on July 12, 2001.  In the evening of July 11, 2001, the patient had an 18-beat run of nonsustained monomorphic VT with a cycle length of approximately 400 msec.  The patient was asymptomatic at the time.  He did not complain of palpitations, syncope, or presyncope.  The patient had an EP consult to be performed.  At per [name], M.D., F.A.C.C., the patient was discharged to home to be admitted for an EP study and possible ICD on July 23, 2001, with [name]. [name], M.D.  DISPOSITION:  The patient was discharged to home.  DISCHARGE MEDICATIONS: 1. Plavix 75 mg daily for one month. 2. Coated aspirin 325 mg daily. 3. Lopressor 50 mg half of a tablet twice a day. 4. Sublingual nitroglycerin as needed.  ACTIVITY:  He was instructed not to do any heavy lifting, driving, sexual activity, or exertional activity for the next two days.   He was also instructed not to have any strenuous activity until after his procedure on July 23, 2001.  DIET:  A low-fat, low-cholesterol diet.  WOUND CARE:  He was to observe his catheterization site for any bruising, drainage, swelling, or discomfort.  FOLLOW-UP:  He was to report to same-day surgery, section C, at 6 a.m. on July 23, 2001.  He was instructed not to have anything to eat or drink after midnight on July 22, 2001, and not to take his medications prior to his procedure.  An appointment was scheduled with Billy Fus C. [name], M.D., on August 10, 2001, at 11:45 a.m. DD:  07/13/01 TD:  07/14/01 Job: [id number]

## 2011-05-09 NOTE — Procedures (Signed)
[place]. [facility]  Patient:    [name], [name]                         MRN: [id number] Proc. Date: 07/23/01 Adm. Date:  [id number] Disc. Date: [id number] Attending:  Lewayne Bunting CC:         Thomas C. Wall, M.D. [facility]  Dr. Hayden Pedro, [facility], [place]   Procedure Report  PROCEDURE:  Electrophysiologic study.  INTRODUCTION:  The patient is a very pleasant 70 year old man with a history of ischemic heart disease and an ejection fraction of 35%, status post myocardial infarction.  He was in the hospital recently and underwent catheterization and subsequently developed just prior to discharge an 18-beat run of nonsustained VT.  He is now referred for electrophysiologic study and risk stratification for malignant ventricular arrhythmias.  DESCRIPTION OF PROCEDURE:  After informed consent was obtained, the patient was taken to the diagnostic EP lab in a fasted state.  After the usual preparation and draping, intravenous fentanyl and midazolam were given for sedation.  A 5 French quadripolar catheter was inserted percutaneously in the right femoral vein and advanced to the right ventricle.  A 5 French quadripolar catheter was inserted percutaneously in the right femoral vein and advanced to the His bundle region.  After measurement of the basic intervals, rapid ventricular pacing was carried out from the RV apex, demonstrating a VA Wenckebach cycle length of 550 milliseconds.  Next, programmed ventricular stimulation was carried out from the RV apex at a basic drive cycle length of 578 milliseconds.  The S1-S2 interval was stepwise decreased down to 320 milliseconds, where the retrograde AV node ERP was observed.  Additional decrements in the S1-S2 interval were carried out down to 210 milliseconds, where ventricular refractoriness was observed.  Additional programmed ventricular stimulation was carried out at a basic drive cycle length of  469 milliseconds as well as 400 milliseconds, with S1-S2, S1-S2-S3, and S1-S2-S3-S4 stimuli delivered, with the S1-S2, S2-S3, and S3-S4 intervals stepwise decreased down to ventricular refractoriness.  During programmed ventricular stimulation, there was no inducible VT.  The catheter was maneuvered into the RV outflow tract and additional programmed ventricular stimulation was carried out.  S1-S2, S1-S2-S3, and S1-S2-S3-S4 stimuli were delivered with the basic drive cycle length of 629 and 400 milliseconds at an S1-S2-S3-S4 coupling interval of 500/230/210/380.  There was inducible ventricular fibrillation.  This occurred when a paced beat landed on the T-wave of a PVC, initiating ventricular fibrillation.  This required defibrillation to return to sinus rhythm.  The patient overall tolerated the procedure well with no inducible monomorphic ventricular arrhythmias.  The catheters were removed, hemostasis assured, and the patient returned to his room in satisfactory condition.  COMPLICATIONS:  There were no immediate procedural complications.  RESULTS:  A. BASELINE ELECTROCARDIOGRAM:  The baseline ECG demonstrates normal sinus    rhythm with inferior myocardial infarction.  B. BASELINE INTERVALS:  The sinus node cycle length was 870 milliseconds, and    the HV interval was 38 milliseconds.  C. RAPID VENTRICULAR PACING:  Rapid ventricular pacing was carried out from    the RV apex at a pacing cycle length of 550 milliseconds, demonstrating VA    dissociation.  D. PROGRAMMED VENTRICULAR STIMULATION:  Programmed ventricular stimulation was    carried out from the RV apex and RV outflow tracts at basic drive cycle    lengths of 500 and 400 milliseconds.  S1-S2, S1-S2-S3,  and S1-S2-S3-S4    stimuli were delivered with the S1-S2, S2-S3, and S3-S4 intervals stepwise    decreased down to ventricular refractoriness.  It should be noted that the    retrograde AV node ERP was 500/320.  The  ventricular refractoriness was    500/220.  E. ARRHYTHMIAS OBSERVED:  Ventricular fibrillation.  Initiation:  S1-S2-S3-S4    stimuli.  Duration:  Sustained.  Termination:  With 360 joules of    synchronized energy.  CONCLUSION:  This study failed to demonstrate any inducible monomorphic ventricular arrhythmias.  Ventricular fibrillation was induced with an S4 stimulus landing on the T-wave portion of a PVC, initiating VF.  This was believed to be a nonspecific finding.  RECOMMENDATIONS:  Continue to treat the patient with standard medical therapy for ischemic heart disease. DD:  07/23/01 TD:  07/24/01 Job: [id number]

## 2011-05-09 NOTE — Assessment & Plan Note (Signed)
[facility]                              CARDIOLOGY OFFICE NOTE   JAXAN, MICHEL                         MRN:          [id number]  DATE:09/11/2006                            DOB:          1941/02/19    [name] returns today for follow up of his 2-D echocardiogram.   PROBLEM:  1. Coronary artery disease, status post coronary bypass grafting with      patent grafts after 18 years!  2. History of mitral valve prolapse with severe mitral regurgitation.      Status post mitral valve replacement at [facility].  Please see      Dr. Benjamine Sprague notes.  3. Chronic atrial fibrillation.  4. Anticoagulation.  5. Hypertension.  6. History moderate left ventricular systolic dysfunction.   He has gained back about 5 pounds of the 25 he lost after surgery.  He still  looks remarkably good.  2-D echocardiogram showed normal left ventricular  chamber size with overall normal left ventricular function.  He has mild  LVH.  His prosthetic valve showed mild perivalvular leak but normal gradient  across the valve.  Left atrium is mildly to moderately dilated.  His right  sided function appeared to be normal.  There seems to be some improvement in  LV function.   His meds are:  1. Coumadin as directed.  2. Vytorin 10/20 q.h.s. Lipids at goal this year.  3. Aspirin 81 mg daily.  4. Lasix 40 mg daily.  5. Toprol XL 50 mg daily.  6. Diovan 80 b.i.d.  7. Potassium 20 mEq daily.   His blood pressure is 110/72.  His pulse is 80 and regular.  His weight is  181, up 4 pounds.  His carotid upstrokes were equal bilaterally without bruits.  There is no  JVD. Thyroid is not enlarged.  Trachea is midline.  LUNGS:  Clear.  HEART:  Reveals a prosthetic S1.  He has an irregular rate and rhythm that  is well controlled.  ABDOMEN:  Soft, good bowel sounds.  EXTREMITIES:  No edema.  Pulses are intact.   I am delighted with how Mr. Jakob has responded to surgery.  There  appears to  be an improvement in the left ventricular function.  His weight reduction  has helped him overall.   PLAN:  1. Continue current medications.  2. Keep weight at the current level.  3. Follow up with me in May 2008, a year out from his surgery.                               Thomas C. Daleen Squibb, MD, [facility]    TCW/MedQ  DD:  09/11/2006  DT:  09/14/2006  Job #:  [id number]

## 2011-05-09 NOTE — H&P (Signed)
NAMEGUS, LITTLER NO.:  [id number]   MEDICAL RECORD NO.:  [id number]          PATIENT TYPE:  EMS   LOCATION:  MAJO                         FACILITY:  [facility]   PHYSICIAN:  Learta Codding, M.D. LHCDATE OF BIRTH:  08-04-1941   DATE OF ADMISSION:  03/15/2006  DATE OF DISCHARGE:                                HISTORY & PHYSICAL   PRIMARY CARDIOLOGIST:  Maisie Fus C. Wall, M.D.   PRIMARY CARE PHYSICIAN:  Dr. Wonda Cheng at the [facility] in  [place].   CHIEF COMPLAINT:  Shortness of breath.   HISTORY OF PRESENT ILLNESS:  Mr. Rucinski is a very pleasant 70 year old male  patient with a history of coronary disease, status post CABG and LV  dysfunction, EF of 45-50% and reportedly mild mitral regurgitation, who  presents to the emergency room with complaints of shortness of breath for  the last week.  These symptoms are progressively getting worse.  He is  dyspneic with just minimal exertion.  He does have some shortness of breath  at rest at times.  He notes positive orthopnea, paroxysmal nocturnal edema.  He has been experiencing lower extremity edema and feels bloated.  He has  also had some atypical chest pain.  These are pains that last only for a few  seconds and are sharp.  They are located on both the left and right side of  his chest.  He denies any radiating chest pain, nausea, vomiting,  diaphoresis.  He denies any symptoms reminiscent of his previous angina.  He  denies any palpitations.  He called the answering service today and was  directed to go to the emergency room.  In the emergency room, his ECG  revealed atrial fibrillation.   It should be noted that the patient recently has noted his heart rates up to  a maximum of about 116.   PAST MEDICAL HISTORY:  Coronary artery disease, status post CABG in 1989 at  [facility].  Grafts included LIMA to LAD and vein graft to the obtuse marginal.  Catheterization in 2002 was performed with intervention to the  RCA with bare  metal stents.  Catheterization in December, 2005 revealed patent grafts and  patent stents.  Echocardiogram in November, 2006 revealed an EF of 45-50%,  mild mitral regurgitation, left atrial enlargement.  He has a history of  hypertension, hypercholesterolemia.  He has a past history of TIAs.  He  previously had been on Coumadin. but this was stopped due to some bleeding  from a prostate procedure.  This was many years ago.  He is status post  ventral hernia repair.   MEDICATIONS:  1.  Aspirin 325 mg daily.  2.  Vytorin 10/20 mg q.h.s.  3.  Coreg 25 mg b.i.d.  4.  Diovan 160 mg daily.   ALLERGIES:  1.  SULFA.  2.  CODEINE.  3.  He has been intolerant to Altace in the past.   SOCIAL HISTORY:  Lives in [place] with his wife.  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  He quit smoking many  years ago and denies any  alcohol abuse.   FAMILY HISTORY:  Significant for coronary artery disease.   REVIEW OF SYSTEMS:  Please see HPI.  Denies any fevers, chills, weight  changes.  He notes an occasional headache, which is usual for him.  He  denies any sore throat or voice changes.  He denies any rashes.  Denies any  syncope or presyncope.  He has had a cough productive of clear sputum with  no hemoptysis.  He denies any dysuria, hematuria, weakness, numbness,  symptoms reminiscent of his TIAs, nausea, vomiting, diarrhea, breathing  problems, melena, hematemesis, dysphagia, odynophagia, skin or hair changes.  The rest of the review of systems are negative.   PHYSICAL EXAMINATION:  VITAL SIGNS:  Blood pressure 134/80, pulse 85,  respirations 24, temperature 96.7, oxygen saturation 98% on room air.  GENERAL:  A well-developed and well-nourished male in no acute distress.  HEENT:  Head is normocephalic and atraumatic.  PERRLA.  EOMI.  Sclerae are  clear.  NECK:  With JVD at 10 cm.  Carotids without bruits bilaterally.  ENDOCRINE:  Without thyromegaly.  LYMPH:   Without lymphadenopathy.  LUNGS:  With bilateral rales halfway up the thorax.  CARDIAC:  Normal S1 and S2.  Positive S3.  A 3-4/6 harsh holosystolic  murmur.  SKIN:  Without rashes or lesions.  ABDOMEN:  Soft and nontender.  Normoactive bowel sounds.  EXTREMITIES:  Edema 3+.  MUSCULOSKELETAL:  Without joint deformity.  NEUROLOGIC:  Alert and oriented x3.  Nonfocal.   CHEST X-RAY:  Cardiac enlargement.  Mild interstitial edema.   EKG:  Atrial fibrillation with a heart rate of 91.  No ischemic changes.   LABORATORY DATA:  Pending.   IMPRESSION:  1.  New onset congestive heart failure.  2.  New onset, atrial fibrillation.  3.  Coronary artery disease, status post coronary artery bypass graft.      1.  Patent grafts and stents in December, 2005 at catheterization.  4.  Left ventricular dysfunction with an ejection fraction of 45-50%.  5.  Reportedly mild mitral regurgitation.  6.  History of transient ischemic attack.  7.  Treated hypertension.  8.  Treated dyslipidemia.  9.  Atypical chest pain.   The patient has also been examined by Dr. Andee Lineman.  We plan to admit him for  new-onset congestive heart failure secondary to atrial fibrillation with  rapid ventricular rate.  We suspect significant underlying mitral  regurgitation.  At his catheterization in 2005, his mitral regurgitation was  quantified as moderate, but it was only mild on his echo in 2006.  He has a  significant harsh apical murmur, consistent with significant mitral  regurgitation.  The patient will need diuresis and rate control.  It is not  clear that he has had acute coronary syndrome at this point.  If his  troponins  are negative, we can set up him for an outpatient Myoview study.  We would  have a low threshold for transesophageal echocardiogram to evaluate his  mitral regurgitation further.  We will start him on Coumadin as well.  He may need DC cardioversion in the next 4-6 weeks, pending symptoms of   congestive heart failure.      Tereso Newcomer, P.A.      Learta Codding, M.D. [facility]  Electronically Signed    SW/MEDQ  D:  03/15/2006  T:  03/16/2006  Job:  [id number]

## 2011-05-09 NOTE — Consult Note (Signed)
[place]. [facility]  Patient:    [name], [name]                           MRN: [id number] Proc. Date: 07/09/01 Adm. Date:  07/13/01 Attending:  Nathen May, M.D., [facility] Dictator:   Chinita Pester, C.R.N.P. CC:         Rudene Christians. Ladona Ridgel, M.D. [facility]             Thomas C. Wall, M.D. [facility]                          Consultation Report  REASON FOR CONSULTATION:  Nonsustained VT.  HISTORY OF PRESENT ILLNESS:  Thank you for the consult of this 70 year old male with a past medical history of CAD status post CABG in 1989 who was admitted on July 09, 2001 after a complaint of dizziness after working in the yard.  Blood pressure in the emergency room was 66/44.  He has also been complaining of angina and left arm pain with occasional dizziness over the past several months.  Catheterization showed progressive disease of the RCA. On April 21 he had 18 beats of nonsustained VT, asymptomatic.  On July 22 he underwent PCI of his RCA.  PAST MEDICAL HISTORY:  CAD, MI, hypertension, hyperlipidemia, chronic renal insufficiency.  PAST SURGICAL HISTORY:  CABG at [facility], ventral hernia repair, ear surgery.  SOCIAL HISTORY:  Positive tobacco, quit in 1989 with four packs per day. Positive alcohol one to five beers per day.  FAMILY HISTORY:  Noncontributory.  ALLERGIES:  CODEINE, SULFA which are an intolerance, ALTACE versus ZOCOR causes a rash.  MEDICATIONS: 1. Aspirin 325 q.d. 2. Lopressor 25 q.12h. 3. Colace 200 q.d. 4. Plavix 75 q.d.  REVIEW OF SYSTEMS:  HEENT:  Negative visual changes.  CARDIOVASCULAR: Positive chest pain.  Positive palpitations at times.  RESPIRATORY:  Positive DOE.  GASTROINTESTINAL:  Negative nausea, vomiting, or diarrhea.  LABORATORIES:  Echocardiogram shows an EF of 35-45%.  Chest x-ray:  No apparent disease.  No active disease.  EKG:  Normal sinus rhythm with flipped Ts in V2 and V3.  PHYSICAL EXAMINATION  VITAL SIGNS:  Blood pressure  86/20.  Telemetry shows normal sinus rhythm.  GENERAL:  This is a well-developed 70 year old male lying in bed in no apparent distress.  HEENT:  Sclerae:  Clear.  Nasal and buccal membranes:  Pink and intact.  NECK:  Supple, nontender.  CARDIOVASCULAR:  Rate regular rhythm, positive S1, S2.  No S3.  No murmur.  CHEST:  Clear to auscultation bilateral.  ABDOMEN:  Soft, round, nontender, normoactive bowel sounds.  EXTREMITIES:  No edema.  NEUROLOGIC:  Awake, alert, and oriented x 3.  ASSESSMENT AND PLAN:  Nonsustained ventricular tachycardia, monomorphic, cycle length 400 milliseconds, 18 beats per minute, symptomatic.  Ejection fraction 35-45%, status post coronary artery bypass grafting, status post percutaneous intervention to right coronary artery.  PLAN:  After speaking with Dr. Graciela Husbands patient is to be set up for an outpatient EP study and possible ICD on July 23, 2001. DD:  07/19/01 TD:  07/19/01 Job: [id number]

## 2011-05-09 NOTE — Assessment & Plan Note (Signed)
[facility]                            [place] OFFICE NOTE   DONELL, TOMKINS                         MRN:          [id number]  DATE:03/24/2007                            DOB:          1941-05-17    SUBJECTIVE:  Mr. Gesner returns today for further management for the  following issues:  #1:  Coronary artery disease, status post coronary artery bypass  grafting.  His grafts were patent at the time of his surgery in May,  2007.  #2:  History of mitral valve prolapse with severe mitral regurgitation  and congestive heart failure.  He is status post prosthetic mitral valve  replacement by Dr. Silvestre Mesi at [facility] May, 2007.  #3:  Chronic atrial fibrillation.  #4:  Anticoagulation, followed in [place].  #5:  Hypertension.  #6:  History of moderate left ventricular systolic dysfunction.  He is  currently not in congestive heart failure.   He has kept his weight down and weighs actually 180 pounds today.  He  feels well except for a flare of gout in his left hallux.  He has been  on indomethacin which has driven his blood pressure up.  He has also  been taking Colchicine 0.6 mg t.i.d. which has caused some diarrhea.  He  is pretty much over the gout now.   His INR was checked in [facility] yesterday and was 2.6.   He denies any orthopnea, paroxysmal nocturnal dyspnea, peripheral edema.  He has some dyspnea on exertion but is pretty stable.  He had an  echocardiogram last year.  Please refer to that report.   CURRENT MEDICATIONS:  He is on Coumadin 2.5 mg a day, Vytorin 10/20 mg  daily, enteric coated aspirin 81 mg daily, Lasix 40 mg a day, Toprol XL  50 mg a day, Diovan 80 mg b.i.d., potassium 20 mEq a day, Indomethacin  50 mg t.i.d., which he is coming off of, Colchicine 0.6 t.i.d.   PHYSICAL EXAMINATION:  VITAL SIGNS: His blood pressure today is 144/76,  pulse is 70 and irregular.  Electrocardiogram shows atrial  fibrillation  with a left anterior fascicular block and an incomplete right bundle  branch block which is old.  He has an inferior wall infarct pattern as  well which is old.  His weight is 180.  GENERAL APPEARANCE:  He is in no acute distress.  SKIN:  Color is good.  HEENT:  Normocephalic, atraumatic.  Pupils equal, round, reactive to  light and accommodation. Extraocular movements intact. Sclerae clear.  Facial symmetry is normal.  NECK:  Supple.  Carotid upstrokes are equal bilaterally without bruits.  There is no JVD.  Thyroid is not enlarged.  Trachea is midline.  LUNGS:  Clear.  HEART:  Reveals an irregular rate and rhythm.  He has a very profound  prosthetic S1.  No murmurs are appreciated.  ABDOMEN:  Soft with good bowel sounds.  There is no midline bruit.  EXTREMITIES:  Reveal no cyanosis, clubbing or edema.  Pulses are  present.   ASSESSMENT/PLAN:  Mr. Mcmann is doing well.  I have made no change in his  medical program.  He is to have a CMP, lipid panel and a CBC.  I will  see him back in six months.   I have advised him not to take Indomethacin if at all possible.  I have  asked him to stay on a maintenance dose of Colchicine 0.6 daily in hopes  of avoiding future gouty attacks.     Thomas C. Daleen Squibb, MD, [facility]  Electronically Signed    TCW/MedQ  DD: 03/24/2007  DT: 03/24/2007  Job #: [id number]

## 2011-05-09 NOTE — H&P (Signed)
[place] [facility]. [facility]  Patient:    [name], [name]                         MRN: [id number] Adm. Date:  [id number] Attending:  Mirian Mo CC:         Letta Pate. Danne Harbor, M.D., [facility], [place], [place]   History and Physical  CHIEF COMPLAINT:  Dizziness after working in the yard.  Daughter, who was a former Engineer, civil (consulting) here at [facility], checked his blood pressure and it was 66/44; he was also diaphoretic.  HISTORY OF PRESENT ILLNESS:  [name] is a 70 year old married white male with a previous history of coronary artery disease.  He had a myocardial infarction requiring electrical cardioversion x 2, according to his daughter, 9.  He subsequently had coronary artery bypass grafting x 3 by Dr. Rodman Pickle at [facility].  He has had no cardiological followup since that time.  He has only had a treadmill on occasion per his daughter.  He has been having episodic angina with left arm aching about three times per month over the last several months; this is very hard to get out of him since he is very stoic.  This afternoon, after building a picnic table, he came into the house because he was dizzy and light-headed.  His daughter noticed he was diaphoretic and checked his blood pressure.  It was quite low at 66/44.  They insisted he come to the emergency room, which he finally did by private vehicle to [facility].  In the emergency room, he had RSR prime in V1 and V2 with some T wave inversion in the lateral leads.  His vital signs were stable.  Chest x-ray was normal.  Troponin and CPKs were negative.  BUN and creatinine were elevated at 34 and 1.9.  CBC was normal.  In the emergency room, he had recurrent diaphoresis with hypotension.  He had some shoulder aching.  CT scan with contrast was ordered, which was negative for dissection.  There was no pericardial effusion.  EKG at that time showed deepening T wave inversion in the lateral  leads.  He is now admitted for rule out MI and probable unstable angina.  PAST SURGICAL HISTORY:  He has had a ventral hernia repair as a complication of his bypass surgery.  He has also had history of ear surgery on his tympanic membrane as a child and also as a young adult for hearing loss.   ALLERGIES:  He is intolerant of CODEINE and SULFA.  There is a question of either a reaction to ALTACE VERSUS ZOCOR secondary to a rash about three months ago.  His primary physician stopped both drugs at the same time and the rash disappeared.  SOCIAL HISTORY:  He has a remote smoking history, smoking four packs per day; he quit in 1989 at the time of his surgery.  He drinks one to five beers per day.  PAST MEDICAL HISTORY:  He has no history of diabetes.  He has had hypertension off and on for the past two years.  He was started on Diovan HCTZ 160/12.5 mg yesterday by Dr. Dan Humphreys.  CURRENT MEDICATIONS:  1. Aspirin 650 mg p.o. q.d., which his daughter says he takes inconsistently.  2. He is also on Diovan HCT 160/12.5 mg, which he started yesterday.  REVIEW OF SYSTEMS:  His review of systems is negative for dysphagia, hematemesis, hemoptysis, abdominal pain,  change in bowel habits, melena, hematochezia; positive for urinary frequency and dysuria.  He has had an elevated PSA recently, per his daughter.  He has had some peripheral edema at the end of the day at his ankles.  He denies any claudication or other vascular symptoms.  There is no history of DVT or pulmonary embolus.  His weight has been stable.  SOCIAL HISTORY:  He is married.  He has several children.  [place]  PHYSICAL EXAMINATION:  VITAL SIGNS:  His blood pressure is 110/65, pulse 89 and regular in sinus rhythm.  GENERAL:  He is in no acute distress.  SKIN:  Skin is now warm and dry.  NECK:  No JVD.  Carotid upstrokes are equal bilaterally without bruits.  There is no thyroid  enlargement.  Trachea is midline.  LUNGS:  Clear to auscultation.  CHEST:  Exam revealed a median sternotomy scar.  He has a soft S1 and S2 with a 2-3/6 systolic murmur at the apex; it sounds most like mitral regurgitation. I could not hear the murmur in the aortic area.  There is no gallop or rub.  ABDOMEN:  Protuberant with good bowel sounds.  No specific organomegaly could be appreciated.  EXTREMITIES:  Femoral pulses were intact.  Distal pulses were 2+/4+ on the right and 2+/4+ left dorsalis pedis and 1+/4+ posterior tibial.  There was no significant edema.  There were no venous varicosities or evidence of DVT.  NEUROLOGIC:  Exam is grossly intact.  ASSESSMENT:  1. Dizziness, hypotension and diaphoresis with back pain.  Aortic dissection     has been ruled out by CT scan with contrast.  He has dynamic     electrocardiographic changes now in the lateral leads consistent with     possible coronary ischemia versus myocardial infarction.  2. History of coronary artery disease, status post myocardial infarction and     coronary artery bypass surgery in 1989 at [facility].  3. Hypertension.  4. Hyperlipidemia.  5. Chronic renal insufficiency.  6. Remote tobacco use.  7. Alcohol use.  8. Increased prostate-specific antigen with urinary complaints.  9. Question of intolerance to Zocor and Altace.  He had a rash three months     ago.  Both drugs were stopped simultaneously and we do not know which is     the culprit. 10. Obesity. 11. Sulfa and codeine intolerance.  PLAN:  1. Admit.  2. Check troponins.  3. IV heparin and nitroglycerin.  4. Beta blockade.  5. Aspirin.  6. Hydrate with check of BUN and creatinine tomorrow morning at 11 a.m.  If     his creatinine is less than 1.9, we will consider cardiac catheterization     of coronaries and grafts.  He will need his operative report obtained from     [facility] prior to the catheterization.  7. Check lipid panel.  8. Two-dimensional  echocardiogram. DD:  07/09/01 TD:  07/09/01 Job: [id number]  [id number]

## 2011-05-09 NOTE — Discharge Summary (Signed)
[name], [name]                  ACCOUNT NO.:  [id number]   MEDICAL RECORD NO.:  [id number]          PATIENT TYPE:  INP   LOCATION:  4741                         FACILITY:  [facility]   PHYSICIAN:  Thomas C. Wall, M.D.   DATE OF BIRTH:  [birth year]   DATE OF ADMISSION:  03/15/2006  DATE OF DISCHARGE:  03/20/2006                           DISCHARGE SUMMARY - REFERRING   DISCHARGE DIAGNOSES:  1.  Viral upper respiratory infection. Congestive heart failure, resolved.  2.  Paroxysmal atrial fibrillation with a rapid ventricular rate now      controlled.  3.  Coumadin initiation.  4.  Mild to moderate mitral valve prolapse history as noted below.   SUMMARY OF HISTORY:  [name] is a 70 year old male who presented to [facility] emergency room on the day of admission with a weak history of shortness  of breath that had been progressively worsening. He has also had some  shortness of breath at rest, describes orthopnea, PND, lower extremity  edema, and atypical chest discomfort. On presentation to the emergency room,  EKG showed atrial fibrillation and the patient had noted that his heart  beats have been somewhat faster in the preceding week.   PAST MEDICAL HISTORY:  Notable for known coronary artery disease with bypass  surgery in 1989 at [facility] with a LIMA to the LAD, saphenous vein graft to the  OM.  Last catheterization was in 2002. He received bare metal stenting to  the RCA. Catheterization in 2005 revealed patent grafts and stents. An  echocardiogram in November 2006 showed an EF of 45% to 50% with mild MR,  left atrial enlargement. He also has a history of hypertension,  hyperlipidemia, TIAs, but he had been on Coumadin previously, however it was  discontinued due to a prostate procedure several years ago.   ALLERGIES:  SULFA, CODEINE, and intolerance to ALTACE.   LABORATORY DATA:  Chest x-ray on the 25th showed cardiomegaly and mild  interstitial edema. Repeat chest x-ray on the  28th showed no acute  processes. Admission weight was 183.1 and discharge weight was 178.8.  Admission H&H was 14.2 and 41.7, normal indices, platelet count 148,000, WBC  6.2. Subsequent hematologies were essentially unremarkable. At the time of  discharge H&H was 13.4 and 39.3, normal indices, platelets 122,000, WBC 3.5.  Admission PTT was 31, PT 14.2, INR 1.0.  D-dimer was 0.47. At the time of  discharge PT was 25.1 and INR 2.3. Admission sodium was 145, potassium 4.0,  BUN 22, creatinine 1.0, glucose 103. Alkaline phosphatase was slightly low  at 38.  Subsequent chemistries were unremarkable. At the time of discharge  sodium was 141, potassium 4.6, BUN 38, creatinine 1.3, glucose 103. CK-MB  and troponins were negative times four. Admission BNP was 526.  Fasting  lipids on the 26th showed a total cholesterol of 109, triglycerides 102, HDL  low at 34, and HDL 55.  TSH was 0.731. Urinalysis was unremarkable.  Stools  were heme negative times two on the 26th.  EKGs on admission showed atrial  fibrillation with left axis deviation,  delayed R waves, nonspecific ST-T  wave changes. Subsequent EKG showed continued atrial fibrillation.   HOSPITAL COURSE:  [name] was admitted to unit 4700. He was placed on IV  heparin by pharmacy and Coumadin 5 mg daily was initiated on March 15, 2006,  for his atrial fibrillation. An echocardiogram was performed on the 26th and  this showed an EF of 45% of 50%, inferior/posterior hypokinesis, mild to  moderate LVH, mild mitral valve prolapse involving the anterior leaflet with  mild mitral MR. It was felt that it possibly could be underestimated  secondary to eccentricity, moderate left atrial enlargement, mild decreased  RV function, mild TR.  Enzymes and EKGs ruled out myocardial infarction. He  was placed on medications for ventricular rate control and diuresis pursued  given his associated heart failure.  TEE was performed and did not show any  evidence  of thrombus, mild to moderate MVP of anterior leaflet within the  concentric MR jet, mildly to moderately severe flow reversal in the LV  pulmonary vein, normal LV size and function, moderate left atrial  enlargement. Throughout the night of March 27th he did have some wheezing  and coughing. Medications were adjusted. By the 29th his breathing had  improved. By March 30th it was felt that [name] could be discharged home.   DISPOSITION:  [name] is discharged home. He is asked to maintain a low  salt, fat, cholesterol diet. His activities are not restricted. He is asked  to weigh daily and write these weights down, and to bring all his  medications and weights to all appointment. His new medications include  Coumadin 5 mg daily, digoxin 0.125 mg daily, Lasix 40 mg daily, Kay Ciel 20  mEq daily, nitroglycerin 0.4 p.r.n. His aspirin was decreased to 81 mg daily  and his Diovan was increased to 160  mg b.i.d. He is started on new Humibid  LA 1200 mg b.i.d. for seven days. He is asked to continue his Advair b.i.d.,  Vytorin 10/20 q.h.s., Coreg 25 mg b.i.d., and Robitussin DM as needed.  He  will have a PT-INR on Monday, March 23, 2006, at 4 p.m. and follow up with  Dr. Daleen Squibb after his PT-INR.   Discharge time greater than 30 minutes.      Joellyn Rued, P.A. [facility]      Thomas C. Wall, M.D.  Electronically Signed    EW/MEDQ  D:  03/20/2006  T:  03/21/2006  Job:  [id number]   cc:   Thomas C. Wall, M.D.  [address]  [address]  [place]  [place] [address]   Wonda Cheng  Fax: [phone]

## 2011-05-14 ENCOUNTER — Encounter: Payer: Medicare Other | Admitting: Emergency Medicine

## 2011-05-15 ENCOUNTER — Ambulatory Visit (HOSPITAL_BASED_OUTPATIENT_CLINIC_OR_DEPARTMENT_OTHER)
Admission: RE | Admit: 2011-05-15 | Discharge: 2011-05-15 | Disposition: A | Discharge status: Home / Self Care | Payer: Medicare Other | Source: Ambulatory Visit | Attending: Urology | Admitting: Urology

## 2011-05-15 DIAGNOSIS — I4891 Unspecified atrial fibrillation: Secondary | ICD-10-CM | POA: Insufficient documentation

## 2011-05-15 DIAGNOSIS — Z8546 Personal history of malignant neoplasm of prostate: Secondary | ICD-10-CM | POA: Insufficient documentation

## 2011-05-15 DIAGNOSIS — Z951 Presence of aortocoronary bypass graft: Secondary | ICD-10-CM | POA: Insufficient documentation

## 2011-05-15 DIAGNOSIS — Z954 Presence of other heart-valve replacement: Secondary | ICD-10-CM | POA: Insufficient documentation

## 2011-05-15 DIAGNOSIS — N35919 Unspecified urethral stricture, male, unspecified site: Secondary | ICD-10-CM | POA: Insufficient documentation

## 2011-05-15 DIAGNOSIS — Z01812 Encounter for preprocedural laboratory examination: Secondary | ICD-10-CM | POA: Insufficient documentation

## 2011-05-15 DIAGNOSIS — Z923 Personal history of irradiation: Secondary | ICD-10-CM | POA: Insufficient documentation

## 2011-05-15 LAB — GLUCOSE, CAPILLARY: Glucose-Capillary: 114 mg/dL — ABNORMAL HIGH (ref 70–99)

## 2011-05-15 LAB — POCT I-STAT 4, (NA,K, GLUC, HGB,HCT)
Glucose, Bld: 123 mg/dL — ABNORMAL HIGH (ref 70–99)
Hemoglobin: 13.3 g/dL (ref 13.0–17.0)

## 2011-05-20 ENCOUNTER — Other Ambulatory Visit (INDEPENDENT_AMBULATORY_CARE_PROVIDER_SITE_OTHER): Payer: Medicare Other | Admitting: *Deleted

## 2011-05-20 DIAGNOSIS — I4891 Unspecified atrial fibrillation: Secondary | ICD-10-CM

## 2011-05-20 LAB — POCT INR: INR: 1.2

## 2011-05-23 ENCOUNTER — Other Ambulatory Visit (INDEPENDENT_AMBULATORY_CARE_PROVIDER_SITE_OTHER): Payer: Medicare Other | Admitting: *Deleted

## 2011-05-23 DIAGNOSIS — I059 Rheumatic mitral valve disease, unspecified: Secondary | ICD-10-CM

## 2011-05-23 DIAGNOSIS — I4891 Unspecified atrial fibrillation: Secondary | ICD-10-CM

## 2011-05-23 NOTE — Op Note (Signed)
  NAMEYONG, [name]                  ACCOUNT NO.:  [id number]  MEDICAL RECORD NO.:  [id number]           PATIENT TYPE:  LOCATION:                                 FACILITY:  PHYSICIAN:  Adisa Litt I. Patsi Sears, M.D. DATE OF BIRTH:  DATE OF PROCEDURE: DATE OF DISCHARGE:                              OPERATIVE REPORT   PREOPERATIVE DIAGNOSIS:  Urethral stricture disease.  POSTOPERATIVE DIAGNOSIS:  Urethral stricture disease.  OPERATION:  Cystourethroscopy, Cook balloon urethral dilation.  SURGEON:  Jovante Hammitt I. Patsi Sears, MD  ANESTHESIA:  General LMA.  PREPARATION:  After appropriate preanesthesia, the patient was brought to the operating room and placed on the operating room table in the dorsal supine position where general LMA anesthesia was introduced.  He was then replaced in dorsal lithotomy position where the pubis was prepped with Betadine solution and draped in usual fashion.  REVIEW OF HISTORY:  [name] is a 70 year old male with a history of prostate cancer, status post external beam radiation therapy plus I-125 seed therapy for Gleason VII prostate cancer with original PSA of 42 in June 2010.  The patient has developed frequency, urgency, dysuria, Rapaflo and Uribel, and cystoscopy shows membranous urethral scar.  He is now for balloon dilation.  Note, he has past history of coronary artery bypass graft with mitral valve replacement, with chronic atrial fibrillation with Coumadin on Lovenox bridging.  PROCEDURE IN DETAIL:  Cystourethroscopy shows a proximal scar in the membranous urethra.  A guidewire was passed through the scarred urethra into the bladder under fluoroscopic control, and balloon dilation was accomplished for 5 minutes with a Cook balloon dilator at 16 atmospheres pressure.  The patient tolerated the procedure well.  The balloon was deflated, removed, and cystoscopy showed open urethra with some urethral mucosal irritation.  No bleeding was noted.  The  patient was then awakened after the bladder emptied, and taken to the recovery room in good condition.     Chaya Dehaan I. Patsi Sears, M.D.     SIT/MEDQ  D:  05/15/2011  T:  05/15/2011  Job:  [id number]  Electronically Signed by Jethro Bolus M.D. on 05/23/2011 01:23:41 PM

## 2011-05-28 ENCOUNTER — Ambulatory Visit (INDEPENDENT_AMBULATORY_CARE_PROVIDER_SITE_OTHER): Payer: Medicare Other | Admitting: Emergency Medicine

## 2011-05-28 DIAGNOSIS — I059 Rheumatic mitral valve disease, unspecified: Secondary | ICD-10-CM

## 2011-05-28 DIAGNOSIS — I4891 Unspecified atrial fibrillation: Secondary | ICD-10-CM

## 2011-05-28 DIAGNOSIS — Z7901 Long term (current) use of anticoagulants: Secondary | ICD-10-CM

## 2011-05-28 DIAGNOSIS — Z9889 Other specified postprocedural states: Secondary | ICD-10-CM

## 2011-06-18 ENCOUNTER — Ambulatory Visit (INDEPENDENT_AMBULATORY_CARE_PROVIDER_SITE_OTHER): Payer: Medicare Other | Admitting: Emergency Medicine

## 2011-06-18 DIAGNOSIS — Z9889 Other specified postprocedural states: Secondary | ICD-10-CM

## 2011-06-18 DIAGNOSIS — Z7901 Long term (current) use of anticoagulants: Secondary | ICD-10-CM

## 2011-06-18 DIAGNOSIS — I4891 Unspecified atrial fibrillation: Secondary | ICD-10-CM

## 2011-06-18 DIAGNOSIS — I059 Rheumatic mitral valve disease, unspecified: Secondary | ICD-10-CM

## 2011-06-18 LAB — POCT INR: INR: 4.4

## 2011-07-09 ENCOUNTER — Encounter: Payer: Medicare Other | Admitting: Emergency Medicine

## 2011-07-09 ENCOUNTER — Ambulatory Visit (INDEPENDENT_AMBULATORY_CARE_PROVIDER_SITE_OTHER): Payer: Medicare Other | Admitting: Emergency Medicine

## 2011-07-09 DIAGNOSIS — Z7901 Long term (current) use of anticoagulants: Secondary | ICD-10-CM

## 2011-07-09 DIAGNOSIS — I059 Rheumatic mitral valve disease, unspecified: Secondary | ICD-10-CM

## 2011-07-09 DIAGNOSIS — Z9889 Other specified postprocedural states: Secondary | ICD-10-CM

## 2011-07-09 DIAGNOSIS — I4891 Unspecified atrial fibrillation: Secondary | ICD-10-CM

## 2011-07-30 ENCOUNTER — Ambulatory Visit (INDEPENDENT_AMBULATORY_CARE_PROVIDER_SITE_OTHER): Payer: Medicare Other | Admitting: Emergency Medicine

## 2011-07-30 DIAGNOSIS — I059 Rheumatic mitral valve disease, unspecified: Secondary | ICD-10-CM

## 2011-07-30 DIAGNOSIS — Z9889 Other specified postprocedural states: Secondary | ICD-10-CM

## 2011-07-30 DIAGNOSIS — Z7901 Long term (current) use of anticoagulants: Secondary | ICD-10-CM

## 2011-07-30 DIAGNOSIS — I4891 Unspecified atrial fibrillation: Secondary | ICD-10-CM

## 2011-08-27 ENCOUNTER — Ambulatory Visit (INDEPENDENT_AMBULATORY_CARE_PROVIDER_SITE_OTHER): Payer: Medicare Other | Admitting: Emergency Medicine

## 2011-08-27 DIAGNOSIS — I4891 Unspecified atrial fibrillation: Secondary | ICD-10-CM

## 2011-08-27 DIAGNOSIS — Z9889 Other specified postprocedural states: Secondary | ICD-10-CM

## 2011-08-27 DIAGNOSIS — Z7901 Long term (current) use of anticoagulants: Secondary | ICD-10-CM

## 2011-08-27 DIAGNOSIS — I059 Rheumatic mitral valve disease, unspecified: Secondary | ICD-10-CM

## 2011-09-04 ENCOUNTER — Inpatient Hospital Stay: Payer: Medicare Other | Admitting: Internal Medicine

## 2011-09-04 DIAGNOSIS — R0602 Shortness of breath: Secondary | ICD-10-CM

## 2011-09-15 ENCOUNTER — Ambulatory Visit (INDEPENDENT_AMBULATORY_CARE_PROVIDER_SITE_OTHER): Payer: Medicare Other | Admitting: Cardiovascular Disease

## 2011-09-15 ENCOUNTER — Encounter: Payer: Self-pay | Admitting: Cardiovascular Disease

## 2011-09-15 DIAGNOSIS — I10 Essential (primary) hypertension: Secondary | ICD-10-CM

## 2011-09-15 DIAGNOSIS — I059 Rheumatic mitral valve disease, unspecified: Secondary | ICD-10-CM

## 2011-09-15 DIAGNOSIS — I5021 Acute systolic (congestive) heart failure: Secondary | ICD-10-CM

## 2011-09-15 DIAGNOSIS — J449 Chronic obstructive pulmonary disease, unspecified: Secondary | ICD-10-CM

## 2011-09-15 DIAGNOSIS — I25810 Atherosclerosis of coronary artery bypass graft(s) without angina pectoris: Secondary | ICD-10-CM

## 2011-09-15 DIAGNOSIS — E785 Hyperlipidemia, unspecified: Secondary | ICD-10-CM

## 2011-09-15 DIAGNOSIS — I4891 Unspecified atrial fibrillation: Secondary | ICD-10-CM

## 2011-09-15 LAB — POCT INR: INR: 2

## 2011-09-15 NOTE — Assessment & Plan Note (Signed)
Prosthetic valve. Well-functioning, currently on warfarin.

## 2011-09-15 NOTE — Progress Notes (Signed)
Patient ID: [name], male    DOB: 09/10/1941, 70 y.o.   MRN: [id number]  HPI Comments: [name] is a very pleasant 70 year old gentleman with a past medical history of coronary artery disease, bypass surgery, COPD, mitral valve replacement with prosthetic valve on Coumadin, history of chronic atrial fibrillation, diabetes, hypertension, hyperlipidemia, PNA in 03/2010, who presents for routine followup.   He was recently admitted to the hospital from September 13 2 the 14th. Noted to be in atrial fibrillation with RVR, chest pain, bronchitis, COPD exacerbation. He was treated with steroids, antibiotics, rate control for his atrial fibrillation with improvement of his symptoms. He presents today and reports that he has developed several sores on his lips. He has nocturia with some burning. No Foley catheter was placed during his hospital visit. He does not drink much in the evenings and is not know why he is urinating so much. He reports having a problem with hematuria in the past and sees Dr. Cassell Smiles.  His breathing has improved. He feels sluggish and takes all of his energy to do anything but today he feels much better.   [name] had his last stress test in 2009, that showed ejection fraction 43%, inferior wall infarct with no ischemia. MV was functioning well.    echocardiogram done recently confirms prosthetic mitral valve is working well with no significant regurgitation, ejection fraction 40-45% with inferior hypokinesis.   EKG shows atrial fibrillation with rate 84 beats per minute,Right bundle branch block, left anterior fascicular block,  no significant ST or T wave changes      Outpatient Encounter Prescriptions as of 09/15/2011  Medication Sig Dispense Refill  . acetaminophen (TYLENOL) 500 MG tablet Take 500 mg by mouth every 6 (six) hours as needed.        Marland Kitchen aspirin 81 MG EC tablet Take 81 mg by mouth daily.        . carvedilol (COREG) 12.5 MG tablet Take 12.5 mg by mouth daily.        . Cholecalciferol (VITAMIN D3) 1000 UNITS CAPS Take 1 capsule by mouth daily.        . digoxin (LANOXIN) 0.125 MG tablet Take 125 mcg by mouth daily.        . furosemide (LASIX) 40 MG tablet Take 40 mg by mouth daily.        Marland Kitchen losartan (COZAAR) 100 MG tablet Take 100 mg by mouth daily.       . metFORMIN (GLUCOPHAGE) 500 MG tablet Take 500 mg by mouth 2 (two) times daily with a meal.       . simvastatin (ZOCOR) 40 MG tablet Take 40 mg by mouth at bedtime.        . Tamsulosin HCl (FLOMAX) 0.4 MG CAPS Take 0.4 mg by mouth daily.       Marland Kitchen warfarin (COUMADIN) 2.5 MG tablet Take by mouth as directed.          Review of Systems  Constitutional: Positive for fatigue.  HENT: Negative.        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   Respiratory: Positive for shortness of breath.   Cardiovascular: Negative.   Gastrointestinal: Negative.   Musculoskeletal: Negative.   Skin: Negative.   Neurological: Positive for weakness.  Hematological: Negative.   Psychiatric/Behavioral: Negative.   All other systems reviewed and are negative.   BP 139/72  Pulse 93  Ht 5\' 7"  (1.702 m)  Wt 171 lb  8 oz (77.792 kg)  BMI 26.86 kg/m2   Physical Exam  Nursing note and vitals reviewed. Constitutional: He is oriented to person, place, and time. He appears well-developed and well-nourished.  HENT:  Head: Normocephalic.  Nose: Nose normal.  Mouth/Throat: Oropharynx is clear and moist.       Several sores noted on his lips in 3 places that are healing  Eyes: Conjunctivae are normal. Pupils are equal, round, and reactive to light.  Neck: Normal range of motion. Neck supple. No JVD present.  Cardiovascular: Normal rate, regular rhythm, S1 normal, S2 normal and intact distal pulses.  Exam reveals no gallop and no friction rub.   Murmur heard.  Crescendo systolic murmur is present with a grade of 2/6  Pulmonary/Chest: Effort normal. No respiratory distress. He has decreased breath  sounds. He has no wheezes. He has no rales. He exhibits no tenderness.  Abdominal: Soft. Bowel sounds are normal. He exhibits no distension. There is no tenderness.  Musculoskeletal: Normal range of motion. He exhibits no edema and no tenderness.  Lymphadenopathy:    He has no cervical adenopathy.  Neurological: He is alert and oriented to person, place, and time. Coordination normal.  Skin: Skin is warm and dry. No rash noted. No erythema.  Psychiatric: He has a normal mood and affect. His behavior is normal. Judgment and thought content normal.           Assessment and Plan

## 2011-09-15 NOTE — Patient Instructions (Signed)
You are doing well. No medication changes were made. Please call us if you have new issues that need to be addressed before your next appt.  We will call you for a follow up Appt. In 6 months  

## 2011-09-15 NOTE — Assessment & Plan Note (Signed)
His heart rate has improved with improvement of his underlying lung function and after medication changes made in the hospital. We will continue him on his current medication regimen no changes.

## 2011-09-15 NOTE — Assessment & Plan Note (Signed)
He appears to be recovering well from his recent COPD exacerbation, having finished his Z-Pak and steroid taper. He continues to use a nebulizer p.r.n. And his inhalers on a regular basis.

## 2011-09-15 NOTE — Assessment & Plan Note (Signed)
His cholesterol has been close to goal. Goal LDL less than 70, goal total cholesterol less than 150.

## 2011-09-15 NOTE — Assessment & Plan Note (Signed)
Currently with no symptoms of angina. No further workup at this time. Continue current medication regimen. 

## 2011-09-24 ENCOUNTER — Ambulatory Visit (INDEPENDENT_AMBULATORY_CARE_PROVIDER_SITE_OTHER): Payer: Medicare Other | Admitting: Emergency Medicine

## 2011-09-24 DIAGNOSIS — Z9889 Other specified postprocedural states: Secondary | ICD-10-CM

## 2011-09-24 DIAGNOSIS — I059 Rheumatic mitral valve disease, unspecified: Secondary | ICD-10-CM

## 2011-09-24 DIAGNOSIS — Z7901 Long term (current) use of anticoagulants: Secondary | ICD-10-CM

## 2011-09-24 DIAGNOSIS — I4891 Unspecified atrial fibrillation: Secondary | ICD-10-CM

## 2011-09-24 LAB — POCT INR: INR: 2.8

## 2011-09-25 ENCOUNTER — Encounter: Payer: Self-pay | Admitting: Cardiovascular Disease

## 2011-09-26 LAB — GLUCOSE, CAPILLARY
Glucose-Capillary: 123 mg/dL — ABNORMAL HIGH (ref 70–99)
Glucose-Capillary: 156 mg/dL — ABNORMAL HIGH (ref 70–99)

## 2011-10-10 ENCOUNTER — Telehealth: Payer: Self-pay | Admitting: *Deleted

## 2011-10-10 NOTE — Telephone Encounter (Signed)
Dr. Wynn Maudlin office called stating pt in office today with bleeding from rectum, possibly from prostate, and asking if pt can stop ASA and coumadin. Dr. Mariah Milling aware, and spoke with Dr. Achilles Dunk, pt will stop ASA and continue Coumadin. Pt takes coumadin for mitral valve replacement and a fib. Pt will f/u at next coumadin visit as already scheduled on 10/22/11.

## 2011-10-22 ENCOUNTER — Ambulatory Visit (INDEPENDENT_AMBULATORY_CARE_PROVIDER_SITE_OTHER): Payer: Medicare Other | Admitting: Emergency Medicine

## 2011-10-22 DIAGNOSIS — Z7901 Long term (current) use of anticoagulants: Secondary | ICD-10-CM

## 2011-10-22 DIAGNOSIS — I4891 Unspecified atrial fibrillation: Secondary | ICD-10-CM

## 2011-10-22 DIAGNOSIS — Z9889 Other specified postprocedural states: Secondary | ICD-10-CM

## 2011-10-22 DIAGNOSIS — I059 Rheumatic mitral valve disease, unspecified: Secondary | ICD-10-CM

## 2011-10-22 LAB — POCT INR: INR: 2.9

## 2011-10-30 ENCOUNTER — Ambulatory Visit: Payer: Medicare Other | Admitting: Urology

## 2011-11-07 ENCOUNTER — Ambulatory Visit: Payer: Medicare Other | Admitting: Urology

## 2011-11-10 ENCOUNTER — Telehealth: Payer: Self-pay | Admitting: *Deleted

## 2011-11-10 NOTE — Telephone Encounter (Signed)
Pt needs clearance to stop coumadin 5 days prior to cystoscopy with dilation on 11/18/11 with Dr. Achilles Dunk. Please advise.   Also requesting Warfarin refill with [facility].

## 2011-11-12 ENCOUNTER — Encounter: Payer: Self-pay | Admitting: *Deleted

## 2011-11-12 MED ORDER — ENOXAPARIN SODIUM 80 MG/0.8ML ~~LOC~~ SOLN: 80.0000 mg | Freq: Two times a day (BID) | SUBCUTANEOUS | Status: DC

## 2011-11-12 NOTE — Telephone Encounter (Signed)
Called spoke with pt advised last dose of Coumadin on 11/12/11, hold Coumadin 11/22 until procedure on 11/27.  Start Lovenox on 11/23 twice daily until 11/26.  On 11/26 take Lovenox injection in am nothing in pm prior to cystoscopy on 11/27. Pt weight 171.8 labs/78 kg.  Will send rx for Lovenox 80mg  BID (1mg /kg BID per Dr Windell Hummingbird instruction. Pt aware of Lovenox dosing instructions. Rx sent to pharmacy pt aware.  Advised pt to call once ok to resume Cpumadin and Lovenox for further dosing.

## 2011-11-12 NOTE — Telephone Encounter (Signed)
Per Dr. Mariah Milling pt may stop coumadin 5 days prior to procedure and will need Lovenox bridge 1mg /kg BID. Pt has h/o mitral valve replacement-mechanical valve, and a fib. At last ov pt still in a fib. Pt notified to hold coumadin, and knows we will have Erika call back to dose Lovenox. Pt states he is comfortable with giving injections since he has done before. Will fax clearance letter to Dr. Achilles Dunk. Pt requests Lovenox Rx sent to [facility].

## 2011-11-17 ENCOUNTER — Telehealth: Payer: Self-pay | Admitting: Pharmacist

## 2011-11-17 MED ORDER — ENOXAPARIN SODIUM 80 MG/0.8ML ~~LOC~~ SOLN: 80.0000 mg | Freq: Two times a day (BID) | SUBCUTANEOUS | Status: DC

## 2011-11-17 NOTE — Telephone Encounter (Signed)
Pt called to report his procedure that was scheduled for 11/27 has been postponed until 12/4 due to a head cold.  Wanted to know what to do with his Lovenox.  Instructed pt to continue Lovenox as scheduled until procedure on 12/4.  He has been instructed to call back after procedure for further instructions.

## 2011-11-19 ENCOUNTER — Telehealth: Payer: Self-pay | Admitting: *Deleted

## 2011-11-19 ENCOUNTER — Encounter: Payer: Medicare Other | Admitting: Emergency Medicine

## 2011-11-19 MED ORDER — WARFARIN SODIUM 2.5 MG PO TABS: ORAL_TABLET | ORAL | Status: DC

## 2011-11-19 NOTE — Telephone Encounter (Signed)
Refill sent for warfarin for 90 day supply [facility].

## 2011-11-19 NOTE — Telephone Encounter (Signed)
Pt requesting refill for warfarin through [facility].

## 2011-11-25 ENCOUNTER — Ambulatory Visit: Payer: Medicare Other | Admitting: Urology

## 2011-11-26 ENCOUNTER — Telehealth: Payer: Self-pay | Admitting: *Deleted

## 2011-11-26 MED ORDER — WARFARIN SODIUM 2.5 MG PO TABS: ORAL_TABLET | ORAL | Status: DC

## 2011-11-26 MED ORDER — FUROSEMIDE 20 MG PO TABS: 20.0000 mg | ORAL_TABLET | Freq: Every day | ORAL | Status: DC

## 2011-11-26 MED ORDER — DIGOXIN 125 MCG PO TABS: 125.0000 ug | ORAL_TABLET | Freq: Every day | ORAL | Status: DC

## 2011-11-26 NOTE — Telephone Encounter (Signed)
Refill sent to [facility] for 15 tablets for warfarin 2.5 take as directed.

## 2011-11-26 NOTE — Telephone Encounter (Signed)
Spoke with pt states he resumed Coumadin yesterday.  Advised to resume Lovenox today if OK with urologist, resume taking every 12 hours as prior to procedure.  Pt resumed Coumadin yesterday at normal dosage.  Advised pt to take 3 tablets today, and 2 tablets tomorrow, then have INR checked Friday.  INR must be therapeutic to d/c Lovenox.  Made pt appt for Friday pt aware.

## 2011-11-26 NOTE — Telephone Encounter (Signed)
Please call pt, he has completed procedure with no problems and has questions about coming off of Lovenox. Thanks.

## 2011-11-27 LAB — PATHOLOGY REPORT

## 2011-11-28 ENCOUNTER — Ambulatory Visit (INDEPENDENT_AMBULATORY_CARE_PROVIDER_SITE_OTHER): Payer: Medicare Other | Admitting: Emergency Medicine

## 2011-11-28 DIAGNOSIS — Z7901 Long term (current) use of anticoagulants: Secondary | ICD-10-CM

## 2011-11-28 DIAGNOSIS — I4891 Unspecified atrial fibrillation: Secondary | ICD-10-CM

## 2011-11-28 DIAGNOSIS — Z9889 Other specified postprocedural states: Secondary | ICD-10-CM

## 2011-11-28 DIAGNOSIS — I059 Rheumatic mitral valve disease, unspecified: Secondary | ICD-10-CM

## 2011-11-28 LAB — POCT INR: INR: 1.3

## 2011-12-01 ENCOUNTER — Ambulatory Visit (INDEPENDENT_AMBULATORY_CARE_PROVIDER_SITE_OTHER): Payer: Medicare Other | Admitting: Emergency Medicine

## 2011-12-01 DIAGNOSIS — Z7901 Long term (current) use of anticoagulants: Secondary | ICD-10-CM

## 2011-12-01 DIAGNOSIS — I059 Rheumatic mitral valve disease, unspecified: Secondary | ICD-10-CM

## 2011-12-01 DIAGNOSIS — I4891 Unspecified atrial fibrillation: Secondary | ICD-10-CM

## 2011-12-01 DIAGNOSIS — Z9889 Other specified postprocedural states: Secondary | ICD-10-CM

## 2011-12-18 ENCOUNTER — Ambulatory Visit (INDEPENDENT_AMBULATORY_CARE_PROVIDER_SITE_OTHER): Payer: Medicare Other | Admitting: Emergency Medicine

## 2011-12-18 DIAGNOSIS — I4891 Unspecified atrial fibrillation: Secondary | ICD-10-CM

## 2011-12-18 DIAGNOSIS — Z9889 Other specified postprocedural states: Secondary | ICD-10-CM

## 2011-12-18 DIAGNOSIS — I059 Rheumatic mitral valve disease, unspecified: Secondary | ICD-10-CM

## 2011-12-18 DIAGNOSIS — Z7901 Long term (current) use of anticoagulants: Secondary | ICD-10-CM

## 2012-01-02 ENCOUNTER — Encounter: Payer: Self-pay | Admitting: Internal Medicine

## 2012-01-05 ENCOUNTER — Encounter: Payer: Self-pay | Admitting: Cardiovascular Disease

## 2012-01-05 ENCOUNTER — Inpatient Hospital Stay: Payer: Self-pay | Admitting: Internal Medicine

## 2012-01-05 DIAGNOSIS — K5732 Diverticulitis of large intestine without perforation or abscess without bleeding: Secondary | ICD-10-CM | POA: Diagnosis not present

## 2012-01-05 DIAGNOSIS — Z8249 Family history of ischemic heart disease and other diseases of the circulatory system: Secondary | ICD-10-CM | POA: Diagnosis not present

## 2012-01-05 DIAGNOSIS — J441 Chronic obstructive pulmonary disease with (acute) exacerbation: Secondary | ICD-10-CM | POA: Diagnosis not present

## 2012-01-05 DIAGNOSIS — Z7901 Long term (current) use of anticoagulants: Secondary | ICD-10-CM | POA: Diagnosis not present

## 2012-01-05 DIAGNOSIS — J811 Chronic pulmonary edema: Secondary | ICD-10-CM | POA: Diagnosis not present

## 2012-01-05 DIAGNOSIS — R109 Unspecified abdominal pain: Secondary | ICD-10-CM | POA: Diagnosis not present

## 2012-01-05 DIAGNOSIS — K668 Other specified disorders of peritoneum: Secondary | ICD-10-CM | POA: Diagnosis not present

## 2012-01-05 DIAGNOSIS — R339 Retention of urine, unspecified: Secondary | ICD-10-CM | POA: Diagnosis present

## 2012-01-05 DIAGNOSIS — J449 Chronic obstructive pulmonary disease, unspecified: Secondary | ICD-10-CM | POA: Diagnosis not present

## 2012-01-05 DIAGNOSIS — E785 Hyperlipidemia, unspecified: Secondary | ICD-10-CM | POA: Diagnosis present

## 2012-01-05 DIAGNOSIS — K5730 Diverticulosis of large intestine without perforation or abscess without bleeding: Secondary | ICD-10-CM | POA: Diagnosis not present

## 2012-01-05 DIAGNOSIS — J681 Pulmonary edema due to chemicals, gases, fumes and vapors: Secondary | ICD-10-CM | POA: Diagnosis not present

## 2012-01-05 DIAGNOSIS — J9600 Acute respiratory failure, unspecified whether with hypoxia or hypercapnia: Secondary | ICD-10-CM | POA: Diagnosis not present

## 2012-01-05 DIAGNOSIS — Z79899 Other long term (current) drug therapy: Secondary | ICD-10-CM | POA: Diagnosis not present

## 2012-01-05 DIAGNOSIS — I2510 Atherosclerotic heart disease of native coronary artery without angina pectoris: Secondary | ICD-10-CM | POA: Diagnosis present

## 2012-01-05 DIAGNOSIS — I129 Hypertensive chronic kidney disease with stage 1 through stage 4 chronic kidney disease, or unspecified chronic kidney disease: Secondary | ICD-10-CM | POA: Diagnosis present

## 2012-01-05 DIAGNOSIS — E119 Type 2 diabetes mellitus without complications: Secondary | ICD-10-CM | POA: Diagnosis present

## 2012-01-05 DIAGNOSIS — Z9861 Coronary angioplasty status: Secondary | ICD-10-CM | POA: Diagnosis not present

## 2012-01-05 DIAGNOSIS — Z8546 Personal history of malignant neoplasm of prostate: Secondary | ICD-10-CM | POA: Diagnosis not present

## 2012-01-05 DIAGNOSIS — N400 Enlarged prostate without lower urinary tract symptoms: Secondary | ICD-10-CM | POA: Diagnosis present

## 2012-01-05 DIAGNOSIS — N179 Acute kidney failure, unspecified: Secondary | ICD-10-CM | POA: Diagnosis not present

## 2012-01-05 DIAGNOSIS — R52 Pain, unspecified: Secondary | ICD-10-CM | POA: Diagnosis not present

## 2012-01-05 DIAGNOSIS — J438 Other emphysema: Secondary | ICD-10-CM | POA: Diagnosis not present

## 2012-01-05 DIAGNOSIS — I509 Heart failure, unspecified: Secondary | ICD-10-CM | POA: Diagnosis not present

## 2012-01-05 DIAGNOSIS — J189 Pneumonia, unspecified organism: Secondary | ICD-10-CM | POA: Diagnosis not present

## 2012-01-05 DIAGNOSIS — Z882 Allergy status to sulfonamides status: Secondary | ICD-10-CM | POA: Diagnosis not present

## 2012-01-05 DIAGNOSIS — R6889 Other general symptoms and signs: Secondary | ICD-10-CM | POA: Diagnosis not present

## 2012-01-05 DIAGNOSIS — I4891 Unspecified atrial fibrillation: Secondary | ICD-10-CM | POA: Diagnosis not present

## 2012-01-05 DIAGNOSIS — R079 Chest pain, unspecified: Secondary | ICD-10-CM | POA: Diagnosis not present

## 2012-01-05 DIAGNOSIS — I517 Cardiomegaly: Secondary | ICD-10-CM | POA: Diagnosis not present

## 2012-01-05 DIAGNOSIS — I5021 Acute systolic (congestive) heart failure: Secondary | ICD-10-CM | POA: Diagnosis not present

## 2012-01-05 DIAGNOSIS — Z87891 Personal history of nicotine dependence: Secondary | ICD-10-CM | POA: Diagnosis not present

## 2012-01-05 DIAGNOSIS — I5033 Acute on chronic diastolic (congestive) heart failure: Secondary | ICD-10-CM | POA: Diagnosis not present

## 2012-01-05 DIAGNOSIS — N189 Chronic kidney disease, unspecified: Secondary | ICD-10-CM | POA: Diagnosis present

## 2012-01-05 DIAGNOSIS — R0902 Hypoxemia: Secondary | ICD-10-CM | POA: Diagnosis not present

## 2012-01-05 DIAGNOSIS — Z833 Family history of diabetes mellitus: Secondary | ICD-10-CM | POA: Diagnosis not present

## 2012-01-05 DIAGNOSIS — K631 Perforation of intestine (nontraumatic): Secondary | ICD-10-CM | POA: Diagnosis not present

## 2012-01-05 DIAGNOSIS — J9620 Acute and chronic respiratory failure, unspecified whether with hypoxia or hypercapnia: Secondary | ICD-10-CM | POA: Diagnosis not present

## 2012-01-05 DIAGNOSIS — Z954 Presence of other heart-valve replacement: Secondary | ICD-10-CM | POA: Diagnosis not present

## 2012-01-05 DIAGNOSIS — Z885 Allergy status to narcotic agent status: Secondary | ICD-10-CM | POA: Diagnosis not present

## 2012-01-05 DIAGNOSIS — R0602 Shortness of breath: Secondary | ICD-10-CM | POA: Diagnosis not present

## 2012-01-05 LAB — COMPREHENSIVE METABOLIC PANEL
Albumin: 4.6 g/dL (ref 3.4–5.0)
Alkaline Phosphatase: 54 U/L (ref 50–136)
Anion Gap: 12 (ref 7–16)
BUN: 16 mg/dL (ref 7–18)
Bilirubin,Total: 0.5 mg/dL (ref 0.2–1.0)
Calcium, Total: 9.2 mg/dL (ref 8.5–10.1)
Chloride: 104 mmol/L (ref 98–107)
Co2: 26 mmol/L (ref 21–32)
Creatinine: 1.18 mg/dL (ref 0.60–1.30)
EGFR (African American): >60
EGFR (Non-African Amer.): >60
Osmolality: 289 (ref 275–301)
Potassium: 4.2 mmol/L (ref 3.5–5.1)
Sodium: 142 mmol/L (ref 136–145)
Total Protein: 7.3 g/dL (ref 6.4–8.2)

## 2012-01-05 LAB — CBC
HCT: 39.8 % — ABNORMAL LOW (ref 40.0–52.0)
MCH: 30.6 pg (ref 26.0–34.0)
MCV: 91 fL (ref 80–100)
Platelet: 149 10*3/uL — ABNORMAL LOW (ref 150–440)
RDW: 15.9 % — ABNORMAL HIGH (ref 11.5–14.5)
WBC: 8.2 10*3/uL (ref 3.8–10.6)

## 2012-01-05 LAB — DIGOXIN LEVEL: Digoxin: 0.6 ng/mL

## 2012-01-05 LAB — PROTIME-INR: INR: 2.5

## 2012-01-05 LAB — CK TOTAL AND CKMB (NOT AT ARMC): CK, Total: 90 U/L (ref 35–232)

## 2012-01-06 ENCOUNTER — Encounter: Payer: Self-pay | Admitting: Cardiovascular Disease

## 2012-01-06 DIAGNOSIS — R0602 Shortness of breath: Secondary | ICD-10-CM

## 2012-01-06 LAB — BASIC METABOLIC PANEL
Anion Gap: 15 (ref 7–16)
BUN: 22 mg/dL — ABNORMAL HIGH (ref 7–18)
Chloride: 104 mmol/L (ref 98–107)
Co2: 24 mmol/L (ref 21–32)
EGFR (African American): >60
EGFR (Non-African Amer.): 56 — ABNORMAL LOW
Glucose: 281 mg/dL — ABNORMAL HIGH (ref 65–99)
Osmolality: 298 (ref 275–301)
Potassium: 4.2 mmol/L (ref 3.5–5.1)
Sodium: 143 mmol/L (ref 136–145)

## 2012-01-06 LAB — CBC WITH DIFFERENTIAL/PLATELET
Basophil #: 0 10*3/uL (ref 0.0–0.1)
Basophil %: 0 %
Eosinophil #: 0 10*3/uL (ref 0.0–0.7)
HCT: 35.4 % — ABNORMAL LOW (ref 40.0–52.0)
HGB: 11.9 g/dL — ABNORMAL LOW (ref 13.0–18.0)
Lymphocyte #: 0.5 10*3/uL — ABNORMAL LOW (ref 1.0–3.6)
MCH: 30.7 pg (ref 26.0–34.0)
MCHC: 33.5 g/dL (ref 32.0–36.0)
Monocyte #: 0.7 10*3/uL (ref 0.0–0.7)
Neutrophil #: 9.1 10*3/uL — ABNORMAL HIGH (ref 1.4–6.5)
Neutrophil %: 88.1 %
RDW: 15.9 % — ABNORMAL HIGH (ref 11.5–14.5)

## 2012-01-06 LAB — TROPONIN I: Troponin-I: 0.06 ng/mL — ABNORMAL HIGH

## 2012-01-07 ENCOUNTER — Encounter: Payer: Self-pay | Admitting: Cardiovascular Disease

## 2012-01-07 DIAGNOSIS — I4891 Unspecified atrial fibrillation: Secondary | ICD-10-CM

## 2012-01-07 DIAGNOSIS — J449 Chronic obstructive pulmonary disease, unspecified: Secondary | ICD-10-CM

## 2012-01-07 DIAGNOSIS — R079 Chest pain, unspecified: Secondary | ICD-10-CM

## 2012-01-07 LAB — CBC WITH DIFFERENTIAL/PLATELET
Basophil #: 0 10*3/uL (ref 0.0–0.1)
Eosinophil #: 0 10*3/uL (ref 0.0–0.7)
Eosinophil %: 0 %
HCT: 36.5 % — ABNORMAL LOW (ref 40.0–52.0)
Lymphocyte #: 0.7 10*3/uL — ABNORMAL LOW (ref 1.0–3.6)
MCH: 30.3 pg (ref 26.0–34.0)
MCHC: 32.9 g/dL (ref 32.0–36.0)
MCV: 92 fL (ref 80–100)
Monocyte #: 0.5 10*3/uL (ref 0.0–0.7)
Monocyte %: 5.1 %
Neutrophil #: 8.6 10*3/uL — ABNORMAL HIGH (ref 1.4–6.5)
Platelet: 152 10*3/uL (ref 150–440)
RBC: 3.96 10*6/uL — ABNORMAL LOW (ref 4.40–5.90)
WBC: 9.7 10*3/uL (ref 3.8–10.6)

## 2012-01-07 LAB — BASIC METABOLIC PANEL
Calcium, Total: 8.8 mg/dL (ref 8.5–10.1)
Chloride: 101 mmol/L (ref 98–107)
Co2: 28 mmol/L (ref 21–32)
EGFR (African American): >60
Glucose: 245 mg/dL — ABNORMAL HIGH (ref 65–99)
Osmolality: 296 (ref 275–301)
Potassium: 4.2 mmol/L (ref 3.5–5.1)
Sodium: 141 mmol/L (ref 136–145)

## 2012-01-07 LAB — PROTIME-INR: Prothrombin Time: 29.8 secs — ABNORMAL HIGH (ref 11.5–14.7)

## 2012-01-08 ENCOUNTER — Encounter: Payer: Self-pay | Admitting: Cardiovascular Disease

## 2012-01-08 LAB — BASIC METABOLIC PANEL
BUN: 29 mg/dL — ABNORMAL HIGH (ref 7–18)
Calcium, Total: 8.6 mg/dL (ref 8.5–10.1)
Chloride: 101 mmol/L (ref 98–107)
Co2: 29 mmol/L (ref 21–32)
EGFR (African American): >60
Glucose: 231 mg/dL — ABNORMAL HIGH (ref 65–99)
Osmolality: 298 (ref 275–301)
Potassium: 4.2 mmol/L (ref 3.5–5.1)

## 2012-01-08 LAB — PROTIME-INR
INR: 2.9
Prothrombin Time: 30.8 secs — ABNORMAL HIGH (ref 11.5–14.7)

## 2012-01-09 ENCOUNTER — Encounter: Payer: Self-pay | Admitting: Cardiovascular Disease

## 2012-01-10 LAB — CBC WITH DIFFERENTIAL/PLATELET
Basophil #: 0 10*3/uL (ref 0.0–0.1)
Basophil %: 0.1 %
Eosinophil %: 0 %
HCT: 37.9 % — ABNORMAL LOW (ref 40.0–52.0)
Lymphocyte #: 0.4 10*3/uL — ABNORMAL LOW (ref 1.0–3.6)
Lymphocyte %: 4.6 %
MCHC: 32.8 g/dL (ref 32.0–36.0)
MCV: 93 fL (ref 80–100)
Monocyte %: 3.3 %
Platelet: 157 10*3/uL (ref 150–440)
RBC: 4.08 10*6/uL — ABNORMAL LOW (ref 4.40–5.90)
WBC: 7.9 10*3/uL (ref 3.8–10.6)

## 2012-01-10 LAB — PROTIME-INR
INR: 3.6
Prothrombin Time: 36 secs — ABNORMAL HIGH (ref 11.5–14.7)

## 2012-01-11 ENCOUNTER — Encounter (HOSPITAL_COMMUNITY): Payer: Self-pay | Admitting: *Deleted

## 2012-01-11 ENCOUNTER — Encounter: Payer: Self-pay | Admitting: Cardiovascular Disease

## 2012-01-11 ENCOUNTER — Inpatient Hospital Stay (HOSPITAL_COMMUNITY)
Admission: AD | Admit: 2012-01-11 | Discharge: 2012-01-27 | DRG: 329 | Disposition: A | Discharge status: Home Health | Payer: Medicare Other | Source: Other Acute Inpatient Hospital | Attending: Pulmonary Disease | Admitting: Pulmonary Disease

## 2012-01-11 DIAGNOSIS — Z954 Presence of other heart-valve replacement: Secondary | ICD-10-CM

## 2012-01-11 DIAGNOSIS — F19921 Other psychoactive substance use, unspecified with intoxication with delirium: Secondary | ICD-10-CM | POA: Diagnosis not present

## 2012-01-11 DIAGNOSIS — R319 Hematuria, unspecified: Secondary | ICD-10-CM | POA: Diagnosis present

## 2012-01-11 DIAGNOSIS — K651 Peritoneal abscess: Secondary | ICD-10-CM | POA: Diagnosis present

## 2012-01-11 DIAGNOSIS — E43 Unspecified severe protein-calorie malnutrition: Secondary | ICD-10-CM | POA: Diagnosis present

## 2012-01-11 DIAGNOSIS — R6510 Systemic inflammatory response syndrome (SIRS) of non-infectious origin without acute organ dysfunction: Secondary | ICD-10-CM | POA: Diagnosis present

## 2012-01-11 DIAGNOSIS — K920 Hematemesis: Secondary | ICD-10-CM | POA: Diagnosis present

## 2012-01-11 DIAGNOSIS — K929 Disease of digestive system, unspecified: Secondary | ICD-10-CM | POA: Diagnosis not present

## 2012-01-11 DIAGNOSIS — Z882 Allergy status to sulfonamides status: Secondary | ICD-10-CM

## 2012-01-11 DIAGNOSIS — S31109A Unspecified open wound of abdominal wall, unspecified quadrant without penetration into peritoneal cavity, initial encounter: Secondary | ICD-10-CM | POA: Diagnosis not present

## 2012-01-11 DIAGNOSIS — Z8546 Personal history of malignant neoplasm of prostate: Secondary | ICD-10-CM

## 2012-01-11 DIAGNOSIS — I059 Rheumatic mitral valve disease, unspecified: Secondary | ICD-10-CM

## 2012-01-11 DIAGNOSIS — Z9889 Other specified postprocedural states: Secondary | ICD-10-CM

## 2012-01-11 DIAGNOSIS — I2510 Atherosclerotic heart disease of native coronary artery without angina pectoris: Secondary | ICD-10-CM | POA: Diagnosis present

## 2012-01-11 DIAGNOSIS — Z886 Allergy status to analgesic agent status: Secondary | ICD-10-CM

## 2012-01-11 DIAGNOSIS — K560 Paralytic ileus: Secondary | ICD-10-CM | POA: Diagnosis not present

## 2012-01-11 DIAGNOSIS — I2589 Other forms of chronic ischemic heart disease: Secondary | ICD-10-CM | POA: Diagnosis present

## 2012-01-11 DIAGNOSIS — Z809 Family history of malignant neoplasm, unspecified: Secondary | ICD-10-CM

## 2012-01-11 DIAGNOSIS — I10 Essential (primary) hypertension: Secondary | ICD-10-CM | POA: Diagnosis present

## 2012-01-11 DIAGNOSIS — I25810 Atherosclerosis of coronary artery bypass graft(s) without angina pectoris: Secondary | ICD-10-CM

## 2012-01-11 DIAGNOSIS — I4891 Unspecified atrial fibrillation: Secondary | ICD-10-CM | POA: Diagnosis present

## 2012-01-11 DIAGNOSIS — J4489 Other specified chronic obstructive pulmonary disease: Secondary | ICD-10-CM | POA: Diagnosis present

## 2012-01-11 DIAGNOSIS — Z8249 Family history of ischemic heart disease and other diseases of the circulatory system: Secondary | ICD-10-CM

## 2012-01-11 DIAGNOSIS — J449 Chronic obstructive pulmonary disease, unspecified: Secondary | ICD-10-CM | POA: Diagnosis present

## 2012-01-11 DIAGNOSIS — E785 Hyperlipidemia, unspecified: Secondary | ICD-10-CM

## 2012-01-11 DIAGNOSIS — K219 Gastro-esophageal reflux disease without esophagitis: Secondary | ICD-10-CM | POA: Diagnosis present

## 2012-01-11 DIAGNOSIS — I509 Heart failure, unspecified: Secondary | ICD-10-CM | POA: Diagnosis present

## 2012-01-11 DIAGNOSIS — E119 Type 2 diabetes mellitus without complications: Secondary | ICD-10-CM | POA: Diagnosis present

## 2012-01-11 DIAGNOSIS — Z794 Long term (current) use of insulin: Secondary | ICD-10-CM

## 2012-01-11 DIAGNOSIS — K5732 Diverticulitis of large intestine without perforation or abscess without bleeding: Secondary | ICD-10-CM

## 2012-01-11 DIAGNOSIS — T40605A Adverse effect of unspecified narcotics, initial encounter: Secondary | ICD-10-CM | POA: Diagnosis not present

## 2012-01-11 DIAGNOSIS — I5022 Chronic systolic (congestive) heart failure: Secondary | ICD-10-CM | POA: Diagnosis present

## 2012-01-11 DIAGNOSIS — Z87891 Personal history of nicotine dependence: Secondary | ICD-10-CM

## 2012-01-11 DIAGNOSIS — N139 Obstructive and reflux uropathy, unspecified: Secondary | ICD-10-CM | POA: Diagnosis not present

## 2012-01-11 DIAGNOSIS — K5720 Diverticulitis of large intestine with perforation and abscess without bleeding: Secondary | ICD-10-CM | POA: Diagnosis present

## 2012-01-11 DIAGNOSIS — D649 Anemia, unspecified: Secondary | ICD-10-CM | POA: Diagnosis present

## 2012-01-11 DIAGNOSIS — J95821 Acute postprocedural respiratory failure: Secondary | ICD-10-CM | POA: Diagnosis not present

## 2012-01-11 DIAGNOSIS — B009 Herpesviral infection, unspecified: Secondary | ICD-10-CM | POA: Diagnosis not present

## 2012-01-11 DIAGNOSIS — Z951 Presence of aortocoronary bypass graft: Secondary | ICD-10-CM

## 2012-01-11 DIAGNOSIS — I5021 Acute systolic (congestive) heart failure: Secondary | ICD-10-CM

## 2012-01-11 DIAGNOSIS — A419 Sepsis, unspecified organism: Secondary | ICD-10-CM | POA: Diagnosis present

## 2012-01-11 DIAGNOSIS — Z7901 Long term (current) use of anticoagulants: Secondary | ICD-10-CM

## 2012-01-11 HISTORY — DX: Malignant neoplasm of prostate: C61

## 2012-01-11 LAB — BASIC METABOLIC PANEL
Anion Gap: 9 (ref 7–16)
BUN: 29 mg/dL — ABNORMAL HIGH (ref 7–18)
Calcium, Total: 8.3 mg/dL — ABNORMAL LOW (ref 8.5–10.1)
Co2: 29 mmol/L (ref 21–32)
EGFR (African American): >60
EGFR (Non-African Amer.): >60
Sodium: 139 mmol/L (ref 136–145)

## 2012-01-11 LAB — CBC WITH DIFFERENTIAL/PLATELET
Basophil %: 0 %
Eosinophil #: 0 10*3/uL (ref 0.0–0.7)
Eosinophil %: 0.1 %
HGB: 11.4 g/dL — ABNORMAL LOW (ref 13.0–18.0)
Lymphocyte %: 5.8 %
MCH: 30.6 pg (ref 26.0–34.0)
MCV: 93 fL (ref 80–100)
Monocyte #: 0.2 10*3/uL (ref 0.0–0.7)
Monocyte %: 2.6 %
Neutrophil %: 91.5 %
Platelet: 135 10*3/uL — ABNORMAL LOW (ref 150–440)
RBC: 3.73 10*6/uL — ABNORMAL LOW (ref 4.40–5.90)
RDW: 16.9 % — ABNORMAL HIGH (ref 11.5–14.5)
WBC: 6.6 10*3/uL (ref 3.8–10.6)

## 2012-01-11 LAB — CULTURE, BLOOD (SINGLE)

## 2012-01-11 LAB — PROTIME-INR
INR: 2.7
Prothrombin Time: 29.1 secs — ABNORMAL HIGH (ref 11.5–14.7)

## 2012-01-11 MED ORDER — SODIUM CHLORIDE 0.9 % IV SOLN
INTRAVENOUS | Status: DC
  Administered 2012-01-11: via INTRAVENOUS

## 2012-01-11 MED ORDER — ACETAMINOPHEN 325 MG PO TABS: 650.0000 mg | ORAL_TABLET | Freq: Four times a day (QID) | ORAL | Status: DC | PRN

## 2012-01-11 MED ORDER — SODIUM CHLORIDE 0.9 % IV SOLN
8.0000 mg/h | INTRAVENOUS | Status: DC
  Administered 2012-01-12 (×2): 8 mg/h via INTRAVENOUS
  Filled 2012-01-11 (×5): qty 80

## 2012-01-11 MED ORDER — HYDROMORPHONE HCL PF 1 MG/ML IJ SOLN
0.5000 mg | INTRAMUSCULAR | Status: DC | PRN
  Administered 2012-01-11 – 2012-01-12 (×4): 0.5 mg via INTRAVENOUS
  Filled 2012-01-11 (×4): qty 1

## 2012-01-11 MED ORDER — ACETAMINOPHEN 650 MG RE SUPP: 650.0000 mg | Freq: Four times a day (QID) | RECTAL | Status: DC | PRN

## 2012-01-11 MED ORDER — ZOLPIDEM TARTRATE 5 MG PO TABS: 5.0000 mg | ORAL_TABLET | Freq: Every evening | ORAL | Status: DC | PRN

## 2012-01-11 MED ORDER — INSULIN ASPART 100 UNIT/ML ~~LOC~~ SOLN
0.0000 [IU] | SUBCUTANEOUS | Status: DC
  Administered 2012-01-12: 2 [IU] via SUBCUTANEOUS
  Administered 2012-01-12 (×2): 1 [IU] via SUBCUTANEOUS
  Administered 2012-01-12: 2 [IU] via SUBCUTANEOUS
  Filled 2012-01-11: qty 3

## 2012-01-11 NOTE — H&P (Addendum)
DATE OF ADMISSION:  01/11/2012  PCP:    Wonda Cheng, MD, MD   Chief Complaint:  Ruptured diverticulum/diverticulitis   HPI: [name] is an 71 y.o. male who transferred from [facility]  For second opinion in regard to surgical treatment of diagnosed perforated diverticulum.  He was hospitalized  6 days ago at [facility] for a COPD exacerbation and pneumonia, and Atrial fibrillation with RVR.  He had no bowel movements for the past 5 days, and began to have severe lower abdominal pain and distension  2 days ago and on Chest X-ray had been found to have free air under the diaphragm, so a Ct scan of the ABD was performed and he was found to have a ruptured diverticulum. His family is at the bedside and his daughter an ER Nurse at [facility] gives the entire medical history.  She states that a surgery consultation was performed at [facility] and the surgeon felt that he was not a good candidate for surgery, and suggested medical management and was placed on IV cipro and flagyl.  She states that the patient had decreased bowel sounds and worsening ABD distension, and has had hematemesis, as well as hematuria.  He is on coumadin therapy  Since 2007 for a Valve Replacement.  He has been on Lovenox injections and his coumadin has been on hold.  His PT/INR today was reported as  3.6, and 2.7.    Past Medical History  Diagnosis Date  . Atrial fibrillation   . Hypertension   . Hyperkalemia   . Acute systolic heart failure   . Coronary artery disease         COPD       DM 2           Prostate Cancer S/P Cryosurgery                 Past Surgical History  Procedure Date  . Bypass graft 1989  . Cardiac valve replacement 2007  . Hernia repair   . External ear surgery     drum       Mitral Valve Replacement      CABG        Medications:  HOME MEDS: Prior to Admission medications   Medication Sig Start Date End Date Taking?  Authorizing Provider  carvedilol (COREG) 12.5 MG tablet Take 12.5 mg by mouth 2 (two) times daily with a meal.  07/31/11  Yes Historical Provider, MD  digoxin (LANOXIN) 0.125 MG tablet Take 1 tablet (125 mcg total) by mouth daily. 11/26/11  Yes Antonieta Iba, MD  furosemide (LASIX) 20 MG tablet Take 1 tablet (20 mg total) by mouth daily. 11/26/11  Yes Antonieta Iba, MD  losartan (COZAAR) 100 MG tablet Take 100 mg by mouth daily.  07/30/11  Yes Historical Provider, MD  warfarin (COUMADIN) 2.5 MG tablet Take as directed by the coumadin clinic. 11/26/11  Yes Antonieta Iba, MD  acetaminophen (TYLENOL) 500 MG tablet Take 500 mg by mouth every 6 (six) hours as needed.      Historical Provider, MD  Cholecalciferol (VITAMIN D3) 1000 UNITS CAPS Take 1 capsule by mouth daily.      Historical Provider, MD  enoxaparin (LOVENOX) 80 MG/0.8ML SOLN injection Inject 0.8 mLs (80 mg total) into the skin every 12 (twelve) hours. As instructed 11/17/11   Antonieta Iba, MD  metFORMIN (GLUCOPHAGE) 500 MG tablet Take 500 mg by mouth 2 (  two) times daily with a meal.  08/26/11   Historical Provider, MD  simvastatin (ZOCOR) 40 MG tablet Take 40 mg by mouth at bedtime.      Historical Provider, MD  Tamsulosin HCl (FLOMAX) 0.4 MG CAPS Take 0.4 mg by mouth daily.  07/30/11   Historical Provider, MD    Allergies:  Allergies  Allergen Reactions  . Codeine     REACTION: hives  . Sulfonamide Derivatives     REACTION: hives  . Tylenol-Codeine     REACTION: hives    Social History:   reports that he quit smoking about 24 years ago. His smoking use included Cigarettes. He has a 92 pack-year smoking history. He has never used smokeless tobacco. He reports that he does not drink alcohol or use illicit drugs.  Family History: Family History  Problem Relation Age of Onset  . Coronary artery disease Mother   . Stroke Father   . Heart attack Mother     x10  . Heart attack Brother     with bypass   . Heart attack Sister       with bypass   . Cancer Sister   . Cancer Brother     Review of Systems:  The patient denies anorexia, fever, weight loss, vision loss, decreased hearing, hoarseness, chest pain, syncope, dyspnea on exertion, peripheral edema, balance deficits, hemoptysis, melena, hematochezia, severe indigestion/heartburn, incontinence, genital sores, muscle weakness, suspicious skin lesions, transient blindness, difficulty walking, depression, unusual weight change, enlarged lymph nodes, angioedema, and breast masses.   Physical Exam:  GEN:  Pleasant 71 year old Obese well developed Elderly Caucasian male  examined  and in no acute distress; cooperative with exam Filed Vitals:   01/11/12 2218  BP: 131/73  Pulse: 84  Temp: 98.1 F (36.7 C)  TempSrc: Oral  Resp: 20  Height: 5' 4.8" (1.646 m)  Weight: 76.9 kg (169 lb 8.5 oz)  SpO2: 97%   Blood pressure 131/73, pulse 84, temperature 98.1 F (36.7 C), temperature source Oral, resp. rate 20, height 5' 4.8" (1.646 m), weight 76.9 kg (169 lb 8.5 oz), SpO2 97.00%. PSYCH: He is alert and oriented x4; does not appear anxious does not appear depressed; affect is normal HEENT: Normocephalic and Atraumatic, Mucous membranes pink; PERRLA; EOM intact; Fundi:  Benign;  No scleral icterus, Nares: Patent, Oropharynx: Clear, Edentulous, Neck:  FROM, no cervical lymphadenopathy nor thyromegaly or carotid bruit; no JVD; CHEST WALL: No tenderness CHEST: Normal respiration, clear to auscultation bilaterally HEART: Irregular rate and rhythm; no murmurs rubs or gallops BACK: No kyphosis or scoliosis; no CVA tenderness ABDOMEN: Positive Bowel Sounds, Obese, soft non-tender; no masses, no organomegaly, No Rebound, + Guarding.   Rectal Exam: Not done EXTREMITIES: No cyanosis, clubbing or edema; no ulcerations. Genitalia: not examined PULSES: 2+ and symmetric SKIN: Normal hydration no rash or ulceration CNS: Cranial nerves 2-12 grossly intact no focal neurologic  deficit   Labs & Imaging No results found for this or any previous visit (from the past 48 hour(s)). No results found.    Assessment:  1.   Ruptured Sigmoid Diverticulum 2.   ABD Pain 3.   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   Atrial fibrillation 5.   Hematemesis 6.   Hematuria 7.   COPD 8.   Chronic Systolic CHF 9.   CAD 10.  DM2     Plan:  General Surgery has been contacted and Dr. Donell Beers is to see patient.  Patient is NPO except for  Ice chips, and low rate IVFs have been ordered.  Labs which include a CBC with Differential, C-Met, and PT/INR and a UA have been ordered.   Daily PT/INRs will be checked. Anticoagulation orders will be given once his PT/INR have been reviewed , and once  The INR is subtherapeutic an IV Heparin drip will be ordered.  An IV Protonix drip has also been ordered.  An Acute ABD series will be ordered, and a decision will be made if a CT Scan of the ABD can be done.  Other plans as per orders.  Choice of IV Antibiotic therapy per General Surgery.    CODE STATUS:      FULL CODE        Lanaya Bennis C 01/11/2012, 11:28 PM

## 2012-01-11 NOTE — Consult Note (Addendum)
Reason for Consult:  Diverticulitis Referring Physician: Della Goo  [name] is an 71 y.o. male.  HPI:  Pt is 71 yo male with 3 days of abdominal pain.  He presented to [facility] last Monday (6-7 days ago) with shortness of breath.  He was treated for COPD exacerbation.  He was improving with steroid taper from pulmonary standpoint, but was having trouble having a bowel movement.  He developed severe abdominal pain Friday and CXR showed free air. The severe pain dissipated rapidly.   A CT at that point demonstrated presumably perforated sigmoid diverticulitis.  According to the surgeon at [facility], he had minimal LLQ tenderness, normal white count, and no fevers.  The pt is anticoagulated as well for mechanical valve.  When reviewing all the medical problems and recent COPD exacerbation, the surgeon in conjunction with the pt's input made decision to manage the problem non operatively.  Since that time, he has maintained a normal white count, and has not developed fevers.    He states that he feels better overall since Friday, but is slightly more distended.  He also is needing more pain medication.  He did get regular diet today, however, and still has not had BM.  He was placed on cipro/flagyl 3 days ago.    Past Medical History  Diagnosis Date  . Atrial fibrillation   . Hypertension   . Hyperkalemia   . Acute systolic heart failure   . Coronary artery disease     Past Surgical History  Procedure Date  . Bypass graft 1989  . Cardiac valve replacement 2007  . Hernia repair   . External ear surgery     drum    Family History  Problem Relation Age of Onset  . Coronary artery disease Mother   . Stroke Father   . Heart attack Mother     x10  . Heart attack Brother     with bypass   . Heart attack Sister     with bypass   . Cancer Sister   . Cancer Brother     Social History:  reports that he quit smoking about 24 years ago. His smoking use included Cigarettes.  He has a 92 pack-year smoking history. He has never used smokeless tobacco. He reports that he does not drink alcohol or use illicit drugs.  Allergies:  Allergies  Allergen Reactions  . Codeine     REACTION: hives  . Sulfonamide Derivatives     REACTION: hives  . Tylenol-Codeine     REACTION: hives    Medications:(home)  carvedilol (COREG) 12.5 MG tablet Cholecalciferol (VITAMIN D3) 1000 UNITS CAPS digoxin (LANOXIN) 0.125 MG tablet furosemide (LASIX) 20 MG tablet losartan (COZAAR) 100 MG tablet warfarin (COUMADIN) 2.5 MG tablet acetaminophen (TYLENOL) 500 MG tablet enoxaparin (LOVENOX) 80 MG/0.8ML SOLN injection metFORMIN (GLUCOPHAGE) 500 MG tablet simvastatin (ZOCOR) 40 MG tablet Tamsulosin HCl (FLOMAX) 0.4 MG CAPS   Results for orders placed during the hospital encounter of 01/11/12 (from the past 48 hour(s))  URINALYSIS, ROUTINE W REFLEX MICROSCOPIC     Status: Abnormal   Collection Time   01/11/12 11:30 PM      Component Value Range Comment   Color, Urine BROWN (*) YELLOW  BIOCHEMICALS MAY BE AFFECTED BY COLOR   APPearance CLOUDY (*) CLEAR     Specific Gravity, Urine 1.024  1.005 - 1.030     pH 5.5  5.0 - 8.0     Glucose, UA NEGATIVE  NEGATIVE (mg/dL)  Hgb urine dipstick LARGE (*) NEGATIVE     Bilirubin Urine NEGATIVE  NEGATIVE     Ketones, ur 15 (*) NEGATIVE (mg/dL)    Protein, ur 960 (*) NEGATIVE (mg/dL)    Urobilinogen, UA 1.0  0.0 - 1.0 (mg/dL)    Nitrite NEGATIVE  NEGATIVE     Leukocytes, UA SMALL (*) NEGATIVE    URINE MICROSCOPIC-ADD ON     Status: Abnormal   Collection Time   01/11/12 11:30 PM      Component Value Range Comment   Squamous Epithelial / LPF FEW (*) RARE     WBC, UA 3-6  <3 (WBC/hpf)    RBC / HPF TOO NUMEROUS TO COUNT  <3 (RBC/hpf)    Bacteria, UA RARE  RARE    CBC     Status: Abnormal   Collection Time   01/11/12 11:40 PM      Component Value Range Comment   WBC 8.7  4.0 - 10.5 (K/uL)    RBC 3.92 (*) 4.22 - 5.81 (MIL/uL)    Hemoglobin 11.7 (*)  13.0 - 17.0 (g/dL)    HCT 45.4 (*) 09.8 - 52.0 (%)    MCV 88.8  78.0 - 100.0 (fL)    MCH 29.8  26.0 - 34.0 (pg)    MCHC 33.6  30.0 - 36.0 (g/dL)    RDW 11.9  14.7 - 82.9 (%)    Platelets 121 (*) 150 - 400 (K/uL)   DIFFERENTIAL     Status: Abnormal   Collection Time   01/11/12 11:40 PM      Component Value Range Comment   Neutrophils Relative 91 (*) 43 - 77 (%)    Neutro Abs 7.9 (*) 1.7 - 7.7 (K/uL)    Lymphocytes Relative 5 (*) 12 - 46 (%)    Lymphs Abs 0.4 (*) 0.7 - 4.0 (K/uL)    Monocytes Relative 4  3 - 12 (%)    Monocytes Absolute 0.4  0.1 - 1.0 (K/uL)    Eosinophils Relative 0  0 - 5 (%)    Eosinophils Absolute 0.0  0.0 - 0.7 (K/uL)    Basophils Relative 0  0 - 1 (%)    Basophils Absolute 0.0  0.0 - 0.1 (K/uL)   PROTIME-INR     Status: Abnormal   Collection Time   01/11/12 11:40 PM      Component Value Range Comment   Prothrombin Time 28.7 (*) 11.6 - 15.2 (seconds)    INR 2.65 (*) 0.00 - 1.49    COMPREHENSIVE METABOLIC PANEL     Status: Abnormal   Collection Time   01/11/12 11:40 PM      Component Value Range Comment   Sodium 135  135 - 145 (mEq/L)    Potassium 3.6  3.5 - 5.1 (mEq/L)    Chloride 101  96 - 112 (mEq/L)    CO2 27  19 - 32 (mEq/L)    Glucose, Bld 173 (*) 70 - 99 (mg/dL)    BUN 29 (*) 6 - 23 (mg/dL)    Creatinine, Ser 5.62  0.50 - 1.35 (mg/dL)    Calcium 8.3 (*) 8.4 - 10.5 (mg/dL)    Total Protein 5.5 (*) 6.0 - 8.3 (g/dL)    Albumin 2.4 (*) 3.5 - 5.2 (g/dL)    AST 9  0 - 37 (U/L)    ALT 11  0 - 53 (U/L)    Alkaline Phosphatase 42  39 - 117 (U/L)    Total Bilirubin 0.5  0.3 - 1.2 (mg/dL)    GFR calc non Af Amer 85 (*) >90 (mL/min)    GFR calc Af Amer >90  >90 (mL/min)     No results found.  Review of Systems  Constitutional: Negative.   HENT: Negative.   Eyes: Negative.   Respiratory: Negative.   Cardiovascular: Negative.   Gastrointestinal: Positive for abdominal pain (left lower quadrant).  Genitourinary: Positive for hematuria (frequent clots  ).  Musculoskeletal: Negative.   Skin: Negative.   Neurological:       Very sleepy   Endo/Heme/Allergies: Bruises/bleeds easily.  Psychiatric/Behavioral: Negative.    Blood pressure 131/73, pulse 84, temperature 98.1 F (36.7 C), temperature source Oral, resp. rate 20, height 5' 4.8" (1.646 m), weight 169 lb 8.5 oz (76.9 kg), SpO2 97.00%. Physical Exam  Constitutional: He is oriented to person, place, and time. He appears well-developed and well-nourished. No distress.  HENT:  Head: Normocephalic.  Mouth/Throat: No oropharyngeal exudate.  Eyes: Conjunctivae are normal. Pupils are equal, round, and reactive to light. No scleral icterus.  Neck: Normal range of motion. Neck supple. No tracheal deviation present. No thyromegaly present.  Cardiovascular: Normal rate and intact distal pulses.   No murmur heard.      Irregular   Respiratory: Effort normal and breath sounds normal. No respiratory distress. He has no wheezes. He has no rales. He exhibits no tenderness.  GI: Soft. He exhibits distension (slightly distended). He exhibits no mass. There is tenderness (focal LLQ tenderness). There is guarding (voluntary guarding LLQ). There is no rebound.  Musculoskeletal: Normal range of motion. He exhibits no edema and no tenderness.  Lymphadenopathy:    He has no cervical adenopathy.  Neurological: He is alert and oriented to person, place, and time. Coordination normal.  Skin: Skin is warm and dry. No rash noted. He is not diaphoretic. No erythema. No pallor.  Psychiatric: He has a normal mood and affect. His behavior is normal. Judgment and thought content normal.    Assessment/Plan: Sigmoid diverticulitis  Pt has been stable since Friday on antibiotics.    Unclear if getting worse.  Will repeat CT in AM  Continue IV antibiotics.    Hold coumadin.  OK to give heparin when pt subtherapeutic.  Pt has mechanical valve  Pt may need surgery later today if extensive free air or many  microabscesses  He also may need surgery this week if he does not continue to improve  If he has surgery in this time frame, he will need colostomy.    Starkeisha Vanwinkle 01/12/2012, 1:03 AM

## 2012-01-12 ENCOUNTER — Encounter (HOSPITAL_COMMUNITY)
Admission: AD | Disposition: A | Discharge status: Home Health | Payer: Self-pay | Source: Other Acute Inpatient Hospital | Attending: Pulmonary Disease

## 2012-01-12 ENCOUNTER — Other Ambulatory Visit (INDEPENDENT_AMBULATORY_CARE_PROVIDER_SITE_OTHER): Payer: Self-pay | Admitting: General Surgery

## 2012-01-12 ENCOUNTER — Encounter (HOSPITAL_COMMUNITY): Payer: Self-pay | Admitting: Physician Assistant

## 2012-01-12 ENCOUNTER — Encounter (HOSPITAL_COMMUNITY): Payer: Self-pay | Admitting: Anesthesiology

## 2012-01-12 ENCOUNTER — Inpatient Hospital Stay (HOSPITAL_COMMUNITY): Payer: Medicare Other

## 2012-01-12 ENCOUNTER — Inpatient Hospital Stay (HOSPITAL_COMMUNITY): Payer: Medicare Other | Admitting: Anesthesiology

## 2012-01-12 DIAGNOSIS — J984 Other disorders of lung: Secondary | ICD-10-CM | POA: Diagnosis not present

## 2012-01-12 DIAGNOSIS — Z01810 Encounter for preprocedural cardiovascular examination: Secondary | ICD-10-CM | POA: Diagnosis not present

## 2012-01-12 DIAGNOSIS — K5732 Diverticulitis of large intestine without perforation or abscess without bleeding: Secondary | ICD-10-CM | POA: Diagnosis not present

## 2012-01-12 DIAGNOSIS — J9600 Acute respiratory failure, unspecified whether with hypoxia or hypercapnia: Secondary | ICD-10-CM | POA: Diagnosis not present

## 2012-01-12 DIAGNOSIS — Z4682 Encounter for fitting and adjustment of non-vascular catheter: Secondary | ICD-10-CM | POA: Diagnosis not present

## 2012-01-12 DIAGNOSIS — D684 Acquired coagulation factor deficiency: Secondary | ICD-10-CM | POA: Diagnosis not present

## 2012-01-12 DIAGNOSIS — I059 Rheumatic mitral valve disease, unspecified: Secondary | ICD-10-CM

## 2012-01-12 DIAGNOSIS — I5023 Acute on chronic systolic (congestive) heart failure: Secondary | ICD-10-CM

## 2012-01-12 DIAGNOSIS — K651 Peritoneal abscess: Secondary | ICD-10-CM

## 2012-01-12 DIAGNOSIS — K631 Perforation of intestine (nontraumatic): Secondary | ICD-10-CM | POA: Diagnosis not present

## 2012-01-12 DIAGNOSIS — I5021 Acute systolic (congestive) heart failure: Secondary | ICD-10-CM | POA: Diagnosis not present

## 2012-01-12 DIAGNOSIS — G9340 Encephalopathy, unspecified: Secondary | ICD-10-CM

## 2012-01-12 DIAGNOSIS — I10 Essential (primary) hypertension: Secondary | ICD-10-CM | POA: Diagnosis not present

## 2012-01-12 DIAGNOSIS — I4891 Unspecified atrial fibrillation: Secondary | ICD-10-CM | POA: Diagnosis not present

## 2012-01-12 DIAGNOSIS — J9819 Other pulmonary collapse: Secondary | ICD-10-CM | POA: Diagnosis not present

## 2012-01-12 DIAGNOSIS — R1032 Left lower quadrant pain: Secondary | ICD-10-CM | POA: Diagnosis not present

## 2012-01-12 DIAGNOSIS — N281 Cyst of kidney, acquired: Secondary | ICD-10-CM | POA: Diagnosis not present

## 2012-01-12 DIAGNOSIS — K5720 Diverticulitis of large intestine with perforation and abscess without bleeding: Secondary | ICD-10-CM | POA: Diagnosis present

## 2012-01-12 HISTORY — PX: COLON RESECTION: SHX5231

## 2012-01-12 LAB — PREPARE FRESH FROZEN PLASMA
Unit division: 0
Unit division: 0

## 2012-01-12 LAB — TYPE AND SCREEN
ABO/RH(D): A POS
Antibody Screen: NEGATIVE

## 2012-01-12 LAB — URINALYSIS, ROUTINE W REFLEX MICROSCOPIC
Nitrite: NEGATIVE
Specific Gravity, Urine: 1.024 (ref 1.005–1.030)
Urobilinogen, UA: 1 mg/dL (ref 0.0–1.0)

## 2012-01-12 LAB — COMPREHENSIVE METABOLIC PANEL
Albumin: 2.4 g/dL — ABNORMAL LOW (ref 3.5–5.2)
Alkaline Phosphatase: 42 U/L (ref 39–117)
BUN: 29 mg/dL — ABNORMAL HIGH (ref 6–23)
Potassium: 3.6 mEq/L (ref 3.5–5.1)
Sodium: 135 mEq/L (ref 135–145)
Total Protein: 5.5 g/dL — ABNORMAL LOW (ref 6.0–8.3)

## 2012-01-12 LAB — CBC
MCH: 30.2 pg (ref 26.0–34.0)
MCHC: 33.3 g/dL (ref 30.0–36.0)
Platelets: 121 10*3/uL — ABNORMAL LOW (ref 150–400)
Platelets: 127 10*3/uL — ABNORMAL LOW (ref 150–400)
RDW: 15.3 % (ref 11.5–15.5)
RDW: 15.4 % (ref 11.5–15.5)
WBC: 8.7 10*3/uL (ref 4.0–10.5)

## 2012-01-12 LAB — PROTIME-INR
INR: 2.53 — ABNORMAL HIGH (ref 0.00–1.49)
INR: 1.52 — ABNORMAL HIGH (ref 0.00–1.49)
Prothrombin Time: 18.6 seconds — ABNORMAL HIGH (ref 11.6–15.2)
Prothrombin Time: 27.7 seconds — ABNORMAL HIGH (ref 11.6–15.2)
Prothrombin Time: 28.7 seconds — ABNORMAL HIGH (ref 11.6–15.2)

## 2012-01-12 LAB — BASIC METABOLIC PANEL
BUN: 29 mg/dL — ABNORMAL HIGH (ref 6–23)
Calcium: 8.2 mg/dL — ABNORMAL LOW (ref 8.4–10.5)
GFR calc non Af Amer: 88 mL/min — ABNORMAL LOW (ref >90)
Glucose, Bld: 155 mg/dL — ABNORMAL HIGH (ref 70–99)
Sodium: 138 mEq/L (ref 135–145)

## 2012-01-12 LAB — DIFFERENTIAL
Basophils Absolute: 0 10*3/uL (ref 0.0–0.1)
Eosinophils Relative: 0 % (ref 0–5)
Lymphocytes Relative: 5 % — ABNORMAL LOW (ref 12–46)
Neutro Abs: 7.9 10*3/uL — ABNORMAL HIGH (ref 1.7–7.7)

## 2012-01-12 LAB — URINE MICROSCOPIC-ADD ON

## 2012-01-12 LAB — MRSA PCR SCREENING: MRSA by PCR: NEGATIVE

## 2012-01-12 LAB — GLUCOSE, CAPILLARY
Glucose-Capillary: 194 mg/dL — ABNORMAL HIGH (ref 70–99)
Glucose-Capillary: 130 mg/dL — ABNORMAL HIGH (ref 70–99)
Glucose-Capillary: 144 mg/dL — ABNORMAL HIGH (ref 70–99)

## 2012-01-12 LAB — ABO/RH: ABO/RH(D): A POS

## 2012-01-12 LAB — POCT I-STAT 3, ART BLOOD GAS (G3+)
O2 Saturation: 99 %
TCO2: 31 mmol/L (ref 0–100)
pCO2 arterial: 46.1 mmHg — ABNORMAL HIGH (ref 35.0–45.0)
pH, Arterial: 7.422 (ref 7.350–7.450)
pO2, Arterial: 117 mmHg — ABNORMAL HIGH (ref 80.0–100.0)

## 2012-01-12 LAB — HEMOGLOBIN A1C: Hgb A1c MFr Bld: 8 % — ABNORMAL HIGH (ref <5.7)

## 2012-01-12 SURGERY — COLON RESECTION
Anesthesia: General | Site: Abdomen | Wound class: Dirty or Infected

## 2012-01-12 MED ORDER — ALBUTEROL SULFATE (5 MG/ML) 0.5% IN NEBU: 2.5000 mg | INHALATION_SOLUTION | RESPIRATORY_TRACT | Status: DC | PRN

## 2012-01-12 MED ORDER — ALBUTEROL SULFATE (5 MG/ML) 0.5% IN NEBU
2.5000 mg | INHALATION_SOLUTION | Freq: Four times a day (QID) | RESPIRATORY_TRACT | Status: DC
  Administered 2012-01-12: 2.5 mg via RESPIRATORY_TRACT
  Filled 2012-01-12: qty 0.5

## 2012-01-12 MED ORDER — FUROSEMIDE 10 MG/ML IJ SOLN
20.0000 mg | Freq: Every day | INTRAMUSCULAR | Status: DC
  Administered 2012-01-12: 20 mg via INTRAVENOUS
  Filled 2012-01-12: qty 2

## 2012-01-12 MED ORDER — METRONIDAZOLE IN NACL 5-0.79 MG/ML-% IV SOLN
500.0000 mg | Freq: Three times a day (TID) | INTRAVENOUS | Status: DC
  Filled 2012-01-12 (×2): qty 100

## 2012-01-12 MED ORDER — HYDROMORPHONE HCL PF 1 MG/ML IJ SOLN
INTRAMUSCULAR | Status: AC
  Filled 2012-01-12: qty 1

## 2012-01-12 MED ORDER — ROCURONIUM BROMIDE 100 MG/10ML IV SOLN
INTRAVENOUS | Status: DC | PRN
  Administered 2012-01-12 (×3): 10 mg via INTRAVENOUS
  Administered 2012-01-12: 30 mg via INTRAVENOUS

## 2012-01-12 MED ORDER — PANTOPRAZOLE SODIUM 40 MG IV SOLR: 40.0000 mg | INTRAVENOUS | Status: DC

## 2012-01-12 MED ORDER — GUAIFENESIN ER 600 MG PO TB12
1200.0000 mg | ORAL_TABLET | Freq: Every day | ORAL | Status: DC
  Administered 2012-01-12: 1200 mg via ORAL
  Filled 2012-01-12 (×2): qty 2

## 2012-01-12 MED ORDER — FENTANYL CITRATE 0.05 MG/ML IJ SOLN: 25.0000 ug | INTRAMUSCULAR | Status: DC | PRN

## 2012-01-12 MED ORDER — TAMSULOSIN HCL 0.4 MG PO CAPS
0.4000 mg | ORAL_CAPSULE | Freq: Every day | ORAL | Status: DC
  Administered 2012-01-12: 0.4 mg via ORAL
  Filled 2012-01-12: qty 1

## 2012-01-12 MED ORDER — ETOMIDATE 2 MG/ML IV SOLN
INTRAVENOUS | Status: DC | PRN
  Administered 2012-01-12: 14 mg via INTRAVENOUS

## 2012-01-12 MED ORDER — CHLORHEXIDINE GLUCONATE 0.12 % MT SOLN
15.0000 mL | Freq: Two times a day (BID) | OROMUCOSAL | Status: DC
  Administered 2012-01-12 – 2012-01-26 (×28): 15 mL via OROMUCOSAL
  Filled 2012-01-12 (×31): qty 15

## 2012-01-12 MED ORDER — VITAMIN K1 10 MG/ML IJ SOLN
2.0000 mg | Freq: Once | INTRAVENOUS | Status: AC
  Administered 2012-01-12: 2 mg via INTRAVENOUS
  Filled 2012-01-12: qty 0.2

## 2012-01-12 MED ORDER — PROPOFOL 10 MG/ML IV EMUL
INTRAVENOUS | Status: AC
  Administered 2012-01-12: 10 ug/kg/min via INTRAVENOUS
  Filled 2012-01-12: qty 100

## 2012-01-12 MED ORDER — PANTOPRAZOLE SODIUM 40 MG IV SOLR
40.0000 mg | Freq: Two times a day (BID) | INTRAVENOUS | Status: DC
  Administered 2012-01-12 – 2012-01-18 (×13): 40 mg via INTRAVENOUS
  Filled 2012-01-12 (×17): qty 40

## 2012-01-12 MED ORDER — INSULIN ASPART 100 UNIT/ML ~~LOC~~ SOLN
0.0000 [IU] | SUBCUTANEOUS | Status: DC
  Administered 2012-01-12: 4 [IU] via SUBCUTANEOUS
  Administered 2012-01-13 – 2012-01-14 (×8): 3 [IU] via SUBCUTANEOUS
  Administered 2012-01-15: 2 [IU] via SUBCUTANEOUS
  Administered 2012-01-15: 3 [IU] via SUBCUTANEOUS
  Administered 2012-01-16 – 2012-01-18 (×11): 4 [IU] via SUBCUTANEOUS
  Administered 2012-01-18: 3 [IU] via SUBCUTANEOUS
  Administered 2012-01-18: 4 [IU] via SUBCUTANEOUS
  Administered 2012-01-18: 7 [IU] via SUBCUTANEOUS
  Administered 2012-01-19 (×2): 4 [IU] via SUBCUTANEOUS
  Administered 2012-01-19: 7 [IU] via SUBCUTANEOUS
  Administered 2012-01-19 – 2012-01-20 (×8): 4 [IU] via SUBCUTANEOUS
  Administered 2012-01-21: 3 [IU] via SUBCUTANEOUS
  Administered 2012-01-21 (×5): 4 [IU] via SUBCUTANEOUS
  Administered 2012-01-22 – 2012-01-23 (×4): 3 [IU] via SUBCUTANEOUS
  Administered 2012-01-23: 4 [IU] via SUBCUTANEOUS
  Administered 2012-01-23 – 2012-01-24 (×3): 3 [IU] via SUBCUTANEOUS
  Administered 2012-01-24 – 2012-01-25 (×3): 4 [IU] via SUBCUTANEOUS
  Administered 2012-01-25: 3 [IU] via SUBCUTANEOUS
  Filled 2012-01-12 (×2): qty 3

## 2012-01-12 MED ORDER — ONDANSETRON HCL 4 MG/2ML IJ SOLN: 4.0000 mg | Freq: Four times a day (QID) | INTRAMUSCULAR | Status: DC | PRN

## 2012-01-12 MED ORDER — SODIUM CHLORIDE 0.9 % IV SOLN
INTRAVENOUS | Status: DC | PRN
  Administered 2012-01-12: 19:00:00 via INTRAVENOUS

## 2012-01-12 MED ORDER — SODIUM CHLORIDE 0.9 % IV SOLN
1.0000 g | INTRAVENOUS | Status: DC
  Filled 2012-01-12: qty 1

## 2012-01-12 MED ORDER — METRONIDAZOLE IN NACL 5-0.79 MG/ML-% IV SOLN
500.0000 mg | Freq: Once | INTRAVENOUS | Status: AC
  Administered 2012-01-12: 500 mg via INTRAVENOUS
  Filled 2012-01-12: qty 100

## 2012-01-12 MED ORDER — FENTANYL CITRATE 0.05 MG/ML IJ SOLN
INTRAMUSCULAR | Status: DC | PRN
  Administered 2012-01-12: 50 ug via INTRAVENOUS
  Administered 2012-01-12: 100 ug via INTRAVENOUS
  Administered 2012-01-12: 50 ug via INTRAVENOUS

## 2012-01-12 MED ORDER — ONDANSETRON HCL 4 MG PO TABS: 4.0000 mg | ORAL_TABLET | Freq: Four times a day (QID) | ORAL | Status: DC | PRN

## 2012-01-12 MED ORDER — CARVEDILOL 12.5 MG PO TABS
12.5000 mg | ORAL_TABLET | Freq: Two times a day (BID) | ORAL | Status: DC
  Administered 2012-01-12 (×2): 12.5 mg via ORAL
  Filled 2012-01-12 (×4): qty 1

## 2012-01-12 MED ORDER — ERTAPENEM SODIUM 1 G IJ SOLR
1.0000 g | INTRAMUSCULAR | Status: DC
  Administered 2012-01-12 – 2012-01-21 (×10): 1 g via INTRAVENOUS
  Filled 2012-01-12 (×11): qty 1

## 2012-01-12 MED ORDER — IPRATROPIUM BROMIDE 0.02 % IN SOLN
0.5000 mg | Freq: Four times a day (QID) | RESPIRATORY_TRACT | Status: DC
  Administered 2012-01-12: 0.5 mg via RESPIRATORY_TRACT
  Filled 2012-01-12: qty 2.5

## 2012-01-12 MED ORDER — LATANOPROST 0.005 % OP SOLN
1.0000 [drp] | Freq: Every day | OPHTHALMIC | Status: DC
  Administered 2012-01-12 – 2012-01-26 (×16): 1 [drp] via OPHTHALMIC
  Filled 2012-01-12: qty 2.5

## 2012-01-12 MED ORDER — METOPROLOL TARTRATE 1 MG/ML IV SOLN
5.0000 mg | INTRAVENOUS | Status: DC
  Administered 2012-01-12 – 2012-01-15 (×12): 5 mg via INTRAVENOUS
  Filled 2012-01-12 (×25): qty 5

## 2012-01-12 MED ORDER — IPRATROPIUM BROMIDE 0.02 % IN SOLN: 0.5000 mg | RESPIRATORY_TRACT | Status: DC | PRN

## 2012-01-12 MED ORDER — SIMVASTATIN 40 MG PO TABS
40.0000 mg | ORAL_TABLET | Freq: Every day | ORAL | Status: DC
  Administered 2012-01-12: 40 mg via ORAL
  Filled 2012-01-12 (×2): qty 1

## 2012-01-12 MED ORDER — LACTATED RINGERS IV SOLN
INTRAVENOUS | Status: DC | PRN
  Administered 2012-01-12: 18:00:00 via INTRAVENOUS

## 2012-01-12 MED ORDER — CIPROFLOXACIN IN D5W 400 MG/200ML IV SOLN
400.0000 mg | Freq: Two times a day (BID) | INTRAVENOUS | Status: DC
  Filled 2012-01-12: qty 200

## 2012-01-12 MED ORDER — LOSARTAN POTASSIUM 50 MG PO TABS
100.0000 mg | ORAL_TABLET | Freq: Every day | ORAL | Status: DC
  Administered 2012-01-12: 100 mg via ORAL
  Filled 2012-01-12: qty 2

## 2012-01-12 MED ORDER — SUCCINYLCHOLINE CHLORIDE 20 MG/ML IJ SOLN
INTRAMUSCULAR | Status: DC | PRN
  Administered 2012-01-12: 400 mg via INTRAVENOUS

## 2012-01-12 MED ORDER — BIOTENE DRY MOUTH MT LIQD
1.0000 "application " | Freq: Four times a day (QID) | OROMUCOSAL | Status: DC
  Administered 2012-01-13 – 2012-01-26 (×51): 15 mL via OROMUCOSAL

## 2012-01-12 MED ORDER — POTASSIUM CL IN DEXTROSE 5% 20 MEQ/L IV SOLN
20.0000 meq | INTRAVENOUS | Status: DC
  Filled 2012-01-12 (×2): qty 1000

## 2012-01-12 MED ORDER — IPRATROPIUM-ALBUTEROL 18-103 MCG/ACT IN AERO
6.0000 | INHALATION_SPRAY | RESPIRATORY_TRACT | Status: DC
  Administered 2012-01-13 (×3): 6 via RESPIRATORY_TRACT
  Filled 2012-01-12: qty 14.7

## 2012-01-12 MED ORDER — DIGOXIN 125 MCG PO TABS
0.1250 mg | ORAL_TABLET | Freq: Every day | ORAL | Status: DC
  Administered 2012-01-12: 0.125 mg via ORAL
  Filled 2012-01-12: qty 1

## 2012-01-12 MED ORDER — DIGOXIN 0.25 MG/ML IJ SOLN
0.1250 mg | Freq: Every day | INTRAMUSCULAR | Status: DC
  Administered 2012-01-13 – 2012-01-15 (×3): 0.125 mg via INTRAVENOUS
  Filled 2012-01-12 (×4): qty 0.5

## 2012-01-12 MED ORDER — FENTANYL CITRATE 0.05 MG/ML IJ SOLN
100.0000 ug | INTRAMUSCULAR | Status: DC | PRN
  Administered 2012-01-12: 100 ug via INTRAVENOUS
  Administered 2012-01-13: 25 ug via INTRAVENOUS
  Administered 2012-01-13: 50 ug via INTRAVENOUS
  Administered 2012-01-13: 75 ug via INTRAVENOUS
  Administered 2012-01-13 (×3): 50 ug via INTRAVENOUS
  Administered 2012-01-13: 100 ug via INTRAVENOUS
  Administered 2012-01-13: 50 ug via INTRAVENOUS
  Administered 2012-01-14: 100 ug via INTRAVENOUS
  Administered 2012-01-14: 25 ug via INTRAVENOUS
  Administered 2012-01-14 (×4): 50 ug via INTRAVENOUS
  Administered 2012-01-14 (×4): 25 ug via INTRAVENOUS
  Administered 2012-01-14: 50 ug via INTRAVENOUS
  Administered 2012-01-14: 25 ug via INTRAVENOUS
  Administered 2012-01-14 – 2012-01-15 (×2): 50 ug via INTRAVENOUS
  Administered 2012-01-15 (×4): 100 ug via INTRAVENOUS
  Administered 2012-01-15 (×4): 50 ug via INTRAVENOUS
  Administered 2012-01-16 (×2): 100 ug via INTRAVENOUS
  Filled 2012-01-12 (×22): qty 2

## 2012-01-12 MED ORDER — IOHEXOL 300 MG/ML  SOLN
100.0000 mL | Freq: Once | INTRAMUSCULAR | Status: AC | PRN
  Administered 2012-01-12: 100 mL via INTRAVENOUS

## 2012-01-12 MED ORDER — HYDROMORPHONE HCL PF 1 MG/ML IJ SOLN
0.5000 mg | INTRAMUSCULAR | Status: DC | PRN
  Administered 2012-01-12: 0.5 mg via INTRAVENOUS
  Administered 2012-01-12: 1 mg via INTRAVENOUS
  Filled 2012-01-12 (×2): qty 1

## 2012-01-12 MED ORDER — 0.9 % SODIUM CHLORIDE (POUR BTL) OPTIME
TOPICAL | Status: DC | PRN
  Administered 2012-01-12: 4000 mL

## 2012-01-12 MED ORDER — LACTATED RINGERS IV SOLN
INTRAVENOUS | Status: DC
  Administered 2012-01-12 – 2012-01-15 (×4): via INTRAVENOUS
  Administered 2012-01-19: 20 mL via INTRAVENOUS

## 2012-01-12 MED ORDER — CIPROFLOXACIN IN D5W 400 MG/200ML IV SOLN
400.0000 mg | Freq: Once | INTRAVENOUS | Status: AC
  Administered 2012-01-12: 400 mg via INTRAVENOUS
  Filled 2012-01-12: qty 200

## 2012-01-12 MED ORDER — PROPOFOL 10 MG/ML IV EMUL
5.0000 ug/kg/min | INTRAVENOUS | Status: DC
  Administered 2012-01-12 – 2012-01-13 (×2): 10 ug/kg/min via INTRAVENOUS
  Filled 2012-01-12: qty 100

## 2012-01-12 MED ORDER — IPRATROPIUM-ALBUTEROL 18-103 MCG/ACT IN AERO: 6.0000 | INHALATION_SPRAY | RESPIRATORY_TRACT | Status: DC | PRN

## 2012-01-12 MED ORDER — NITROFURANTOIN MONOHYD MACRO 100 MG PO CAPS
100.0000 mg | ORAL_CAPSULE | Freq: Every day | ORAL | Status: DC
  Administered 2012-01-12: 100 mg via ORAL
  Filled 2012-01-12: qty 1

## 2012-01-12 SURGICAL SUPPLY — 62 items
BANDAGE GAUZE ELAST BULKY 4 IN (GAUZE/BANDAGES/DRESSINGS) ×2 IMPLANT
BLADE SURG ROTATE 9660 (MISCELLANEOUS) IMPLANT
BRR ADH 5X3 SEPRAFILM 6 SHT (MISCELLANEOUS) ×1
CANISTER SUCTION 2500CC (MISCELLANEOUS) ×2 IMPLANT
CHLORAPREP W/TINT 26ML (MISCELLANEOUS) ×2 IMPLANT
CLOTH BEACON ORANGE TIMEOUT ST (SAFETY) ×2 IMPLANT
COVER SURGICAL LIGHT HANDLE (MISCELLANEOUS) ×2 IMPLANT
DRAIN CHANNEL 19F RND (DRAIN) ×2 IMPLANT
DRAPE INCISE IOBAN 66X45 STRL (DRAPES) IMPLANT
DRAPE LAPAROSCOPIC ABDOMINAL (DRAPES) ×2 IMPLANT
DRAPE PROXIMA HALF (DRAPES) IMPLANT
DRAPE UTILITY 15X26 W/TAPE STR (DRAPE) ×6 IMPLANT
DRAPE WARM FLUID 44X44 (DRAPE) ×2 IMPLANT
DRSG PAD ABDOMINAL 8X10 ST (GAUZE/BANDAGES/DRESSINGS) ×1 IMPLANT
ELECT BLADE 4.0 EZ CLEAN MEGAD (MISCELLANEOUS) ×2
ELECT BLADE 6.5 EXT (BLADE) ×1 IMPLANT
ELECT REM PT RETURN 9FT ADLT (ELECTROSURGICAL) ×2
ELECTRODE BLDE 4.0 EZ CLN MEGD (MISCELLANEOUS) IMPLANT
ELECTRODE REM PT RTRN 9FT ADLT (ELECTROSURGICAL) ×1 IMPLANT
EVACUATOR SILICONE 100CC (DRAIN) ×2 IMPLANT
GLOVE BIO SURGEON STRL SZ8 (GLOVE) ×2 IMPLANT
GLOVE BIOGEL PI IND STRL 8 (GLOVE) ×1 IMPLANT
GLOVE BIOGEL PI INDICATOR 8 (GLOVE) ×3
GLOVE ECLIPSE 8.0 STRL XLNG CF (GLOVE) ×4 IMPLANT
GLOVE SURG SIGNA 7.5 PF LTX (GLOVE) ×2 IMPLANT
GOWN PREVENTION PLUS XLARGE (GOWN DISPOSABLE) ×1 IMPLANT
GOWN PREVENTION PLUS XXLARGE (GOWN DISPOSABLE) ×1 IMPLANT
GOWN STRL NON-REIN LRG LVL3 (GOWN DISPOSABLE) ×5 IMPLANT
KIT BASIN OR (CUSTOM PROCEDURE TRAY) ×2 IMPLANT
KIT OSTOMY DRAINABLE 2.75 STR (WOUND CARE) ×1 IMPLANT
KIT ROOM TURNOVER OR (KITS) ×2 IMPLANT
LEGGING LITHOTOMY PAIR STRL (DRAPES) IMPLANT
LIGASURE IMPACT 36 18CM CVD LR (INSTRUMENTS) ×2 IMPLANT
NS IRRIG 1000ML POUR BTL (IV SOLUTION) ×6 IMPLANT
PACK GENERAL/GYN (CUSTOM PROCEDURE TRAY) ×2 IMPLANT
PAD ARMBOARD 7.5X6 YLW CONV (MISCELLANEOUS) ×4 IMPLANT
RELOAD PROXIMATE 75MM BLUE (ENDOMECHANICALS) ×2 IMPLANT
RELOAD STAPLE 75 3.8 BLU REG (ENDOMECHANICALS) IMPLANT
SEPRAFILM PROCEDURAL PACK 3X5 (MISCELLANEOUS) ×1 IMPLANT
SPECIMEN JAR X LARGE (MISCELLANEOUS) ×2 IMPLANT
SPONGE GAUZE 4X4 12PLY (GAUZE/BANDAGES/DRESSINGS) ×2 IMPLANT
SPONGE LAP 18X18 X RAY DECT (DISPOSABLE) IMPLANT
STAPLER PROXIMATE 75MM BLUE (STAPLE) ×2 IMPLANT
STAPLER VISISTAT 35W (STAPLE) ×2 IMPLANT
SUCTION POOLE TIP (SUCTIONS) ×2 IMPLANT
SURGILUBE 2OZ TUBE FLIPTOP (MISCELLANEOUS) IMPLANT
SUT ETHILON 3 0 FSL (SUTURE) ×1 IMPLANT
SUT NOVA 1 T20/GS 25DT (SUTURE) ×1 IMPLANT
SUT PDS AB 1 TP1 96 (SUTURE) ×4 IMPLANT
SUT PROLENE 2 0 CT2 30 (SUTURE) ×2 IMPLANT
SUT PROLENE 2 0 KS (SUTURE) IMPLANT
SUT VIC AB 2-0 SH 18 (SUTURE) ×3 IMPLANT
SUT VIC AB 3-0 SH 18 (SUTURE) ×2 IMPLANT
SUT VICRYL AB 2 0 TIES (SUTURE) ×4 IMPLANT
TAPE CLOTH SURG 6X10 WHT LF (GAUZE/BANDAGES/DRESSINGS) ×1 IMPLANT
TOWEL OR 17X24 6PK STRL BLUE (TOWEL DISPOSABLE) ×2 IMPLANT
TOWEL OR 17X26 10 PK STRL BLUE (TOWEL DISPOSABLE) ×2 IMPLANT
TRAY FOLEY CATH 14FRSI W/METER (CATHETERS) IMPLANT
TRAY PROCTOSCOPIC FIBER OPTIC (SET/KITS/TRAYS/PACK) IMPLANT
UNDERPAD 30X30 INCONTINENT (UNDERPADS AND DIAPERS) IMPLANT
WATER STERILE IRR 1000ML POUR (IV SOLUTION) ×1 IMPLANT
YANKAUER SUCT BULB TIP NO VENT (SUCTIONS) ×2 IMPLANT

## 2012-01-12 NOTE — Consult Note (Signed)
MEDICATION RELATED CONSULT NOTE - INITIAL   Pharmacy Consult for Digoxin Indication: A-fib, CHF  Allergies  Allergen Reactions  . Codeine     REACTION: hives  . Sulfonamide Derivatives     REACTION: hives  . Tylenol-Codeine     REACTION: hives    Patient Measurements: Height: 5' 4.8" (164.6 cm) Weight: 169 lb 8.5 oz (76.9 kg) IBW/kg (Calculated) : 61.04  *  Vital Signs: Temp: 99 F (37.2 C) (01/21 1446) Temp src: Oral (01/21 1446) BP: 109/70 mmHg (01/21 1446) Pulse Rate: 106  (01/21 1446) Intake/Output from previous day: 01/20 0701 - 01/21 0700 In: 750.8 [P.O.:240; I.V.:310.8; IV Piggyback:200] Out: 600 [Urine:600] Intake/Output from this shift: Total I/O In: 645 [Blood:645] Out: 1400 [Urine:1400]  Labs:  [facility] 01/12/12 0545 01/11/12 2340  WBC 8.6 8.7  HGB 11.4* 11.7*  HCT 34.2* 34.8*  PLT 127* 121*  APTT -- --  CREATININE 0.78 0.85  LABCREA -- --  CREATININE 0.78 0.85  CREAT24HRUR -- --  MG -- --  PHOS -- --  ALBUMIN -- 2.4*  PROT -- 5.5*  ALBUMIN -- 2.4*  AST -- 9  ALT -- 11  ALKPHOS -- 42  BILITOT -- 0.5  BILIDIR -- --  IBILI -- --   Estimated Creatinine Clearance: 79.6 ml/min (by C-G formula based on Cr of 0.78).  Recent Labs  [facility] 01/12/12 1536   DIGOXIN 0.9    Medical History: Past Medical History  Diagnosis Date  . Atrial fibrillation   . Hypertension   . Hyperkalemia   . Acute systolic heart failure   . Coronary artery disease     Cath 2005- patent LIMA-LAD, SVG-OM grafts, moderate MR  . Prostate cancer     Medications:  Prescriptions prior to admission  Medication Sig Dispense Refill  . albuterol-ipratropium (COMBIVENT) 18-103 MCG/ACT inhaler Inhale 2 puffs into the lungs as needed. For shortness of breath      . aspirin EC 81 MG tablet Take 81 mg by mouth daily.      . budesonide-formoterol (SYMBICORT) 160-4.5 MCG/ACT inhaler Inhale 2 puffs into the lungs 2 (two) times daily.      . carvedilol (COREG) 12.5 MG tablet  Take 12.5 mg by mouth 2 (two) times daily with a meal.       . Cholecalciferol (VITAMIN D3) 1000 UNITS CAPS Take 1 capsule by mouth daily.        . digoxin (LANOXIN) 0.125 MG tablet Take 125 mcg by mouth daily.      . furosemide (LASIX) 20 MG tablet Take 20 mg by mouth daily.      Marland Kitchen guaiFENesin (MUCINEX) 600 MG 12 hr tablet Take 1,200 mg by mouth at bedtime.      . indomethacin (INDOCIN) 50 MG capsule Take 50 mg by mouth 3 (three) times daily as needed.      Marland Kitchen ipratropium-albuterol (DUONEB) 0.5-2.5 (3) MG/3ML SOLN Take 3 mLs by nebulization as needed. For shortness of breath      . losartan (COZAAR) 100 MG tablet Take 100 mg by mouth daily.       . metFORMIN (GLUCOPHAGE) 500 MG tablet Take 500 mg by mouth 2 (two) times daily with a meal.       . nitrofurantoin (MACRODANTIN) 100 MG capsule Take 100 mg by mouth daily.      Marland Kitchen OVER THE COUNTER MEDICATION Take 1 tablet by mouth daily. Preservision Eye Vitamins      . simvastatin (ZOCOR) 40 MG tablet Take 40  mg by mouth at bedtime.        . Tamsulosin HCl (FLOMAX) 0.4 MG CAPS Take 0.4 mg by mouth daily.       Marland Kitchen warfarin (COUMADIN) 2.5 MG tablet Take 2.5 mg by mouth daily. Take 2.5 mg on Mon, Tues, Thurs,Fri, and Sun. Take 5 mg on Wed and Saturday.       Scheduled:    . ciprofloxacin  400 mg Intravenous Once  . ertapenem  1 g Intravenous Q24H  . furosemide  20 mg Intravenous Daily  . insulin aspart  0-9 Units Subcutaneous Q4H  . latanoprost  1 drop Both Eyes QHS  . metoprolol  5 mg Intravenous Q4H  . metronidazole  500 mg Intravenous Once  . nitrofurantoin (macrocrystal-monohydrate)  100 mg Oral Daily  . phytonadione (VITAMIN K) IV  2 mg Intravenous Once  . Tamsulosin HCl  0.4 mg Oral Daily  . DISCONTD: albuterol  2.5 mg Nebulization Q6H  . DISCONTD: carvedilol  12.5 mg Oral BID WC  . DISCONTD: ciprofloxacin  400 mg Intravenous Q12H  . DISCONTD: digoxin  0.125 mg Oral Daily  . DISCONTD: guaiFENesin  1,200 mg Oral QHS  . DISCONTD: ipratropium   0.5 mg Nebulization Q6H  . DISCONTD: losartan  100 mg Oral Daily  . DISCONTD: metronidazole  500 mg Intravenous Q8H  . DISCONTD: pantoprazole (PROTONIX) IV  40 mg Intravenous Q24H  . DISCONTD: simvastatin  40 mg Oral QHS   Infusions:    . sodium chloride 50 mL/hr at 01/11/12 2347  . pantoprozole (PROTONIX) infusion 8 mg/hr (01/12/12 1242)    Assessment: Patient is a 71 y/o male on chronic Digoxin for history of A-fib and CHF (EF 40-45%).  His current CrCl ~ 80 ml/min. He takes Digoxin 0.125mg  po daily.  His current digoxin level reported as 0.9 ng/ml (goal 0.8 - 2 ng/ml).  Surgery is planned for diverticulitis with perforation.  Meds will be changed to injections as indicated.  Goal of Therapy:  Digoxin level 0.8 - 2 ng/ml  Plan:  1.  Change po Digoxin to Digoxin 0.125 mg IV daily, first dose tomorrow, 01/13/12.  Velda Shell J 01/12/2012,5:06 PM

## 2012-01-12 NOTE — Progress Notes (Signed)
I have seen and examined [name] and discussed the situation with him and his family. He clinically does not look well and has peritonitis on exam. I've reviewed the CT scan which shows a significant amount of free air as well as fluid from his intestinal perforation.  I have recommended emergency exploratory laparotomy with partial colectomy and colostomy for his perforated diverticulitis. I discussed the procedure, risks, and aftercare at length with him and his family.  Risks include but are not limited to bleeding, infection, wound healing problems, colostomy problems, anesthesia, external injury to intra-abdominal organs, prolonged mechanical ventilation, and death.  His INR is currently too high and this is going to be reversed with fresh frozen plasma and vitamin K. Once his INR is appropriate for the operation we will proceed. We have asked cardiology to see him as well.

## 2012-01-12 NOTE — Consult Note (Signed)
Cardiology Consult Note   Patient ID: [name] MRN: [id number], DOB/AGE: 73-20-1942   Admit date: 01/11/2012 Date of Consult: 01/12/2012  Primary Physician: Wonda Cheng, MD, MD Primary Cardiologist: Julien Nordmann, MD  Pt. Profile: 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   PAST CARDIAC STUDIES Cath 11-27-2004  ANGIOGRAPHIC DATA:  1. Ventriculography was performed in the RAO projection and LAO projection. This revealed inferior severe hypokinesis with 2+ mitral regurgitation. Likewise, the left ventriculogram demonstrated perhaps mild  inferolateral hypokinesis.  2. The left main had 80% narrowing tapering distally. It led into a severely diseased LAD which is totally occluded and a severely diseased circumflex which is totally occluded.  3. The internal mammary to the LAD is widely patent and fills the LAD antegrade without high grade stenosis.  4. The previously placed saphenous vein graft to the circumflex system was widely patent. There is some mild tapering in the distal vessels which are somewhat small in caliber.  5. The right coronary artery demonstrates a widely patent vessel with no evidence of restenosis at all at the previous stent site.  IMPRESSION:  1. Moderate mitral regurgitation with an inferolateral wall motion abnormality.  2. Continued patency of the left internal  mammary to the left anterior descending and saphenous vein graft to the obtuse marginal.  3. Continued patency of the stent site to the right coronary artery.   EP Study 07-23-2001  CONCLUSION: This study failed to demonstrate any inducible monomorphic ventricular arrhythmias. Ventricular fibrillation was induced with an S4 stimulus landing on the T-wave portion of a PVC, initiating VF. This was  believed to be a nonspecific finding.  RECOMMENDATIONS: Continue to treat the patient with standard medical therapy for ischemic heart disease.  Cath 07-09-2001  ANGIOGRAPHIC DATA:  1. The left main coronary artery demonstrates about 70-75% distal narrowing. The ostium of the LAD demonstrates 90% narrowing and then the LAD is totally occluded in the mid vessel.  2. The circumflex was totally occluded proximally.  3. The right coronary artery is a dominant vessel with the posterior descending and posterolateral branch. There was about 75% narrowing near the junction of the proximal and mid vessel. Distal to this, there is about 30-40% narrowing. This vessel was laid out carefully, and intracoronary nitroglycerin also administered. The distal vessel was widely patent and free of critical disease. The left internal mammary artery to the distal LAD is widely patent. The vein graft to the obtuse marginal was widely patent.  CONCLUSIONS:  1. Patent internal mammary to the left anterior descending.  2. Patent saphenous vein graft to the obtuse marginal.  3. Progressive disease in the native right coronary artery.  S/P PTCA of his RCA on July 12, 2001.   Reason for consult: preop evaluation  Problem List: Past Medical History  Diagnosis Date  . Atrial fibrillation   . Hypertension   . Hyperkalemia   . Acute systolic heart failure   .  Coronary artery disease     Cath 2005- patent LIMA-LAD, SVG-OM grafts, moderate MR  . Prostate cancer     Past Surgical History  Procedure Date  . Bypass graft 1989     LIMA-LAD, SVG-OM  . Mitral valve replacement 2007    Mechanical, performed at [facility]  . Hernia repair   . External ear surgery     drum    Allergies:  Allergies  Allergen Reactions  . Codeine     REACTION: hives  . Sulfonamide Derivatives     REACTION: hives  . Tylenol-Codeine     REACTION: hives   HPI:   The patient is currently in a significant amount of pain and the decision has been made to undergo emergent exploratory laparotomy with colectomy and colostomy. His history was provided by his family who is in the room with him.   He presented to [facility] on 01/14 with SOB and treated for COPD exacerbation. He notes chest pain with this, which improved on COPD management. He reportedly had not had BM in several days and began to have abdominal pain. CXR on 01/19 revealed free air under his diaphragm. A CT was ordered revealing perforated sigmoid diverticulitis. The surgeon at [facility] reported the patient was afebrile without leukocytosis and minimal LLQ tenderness. The decision was made to manage nonoperatively. He was placed on Cipro/Flagyl IV. WBC and temp remained normal, but he became more distended with increased abdominal pain. He was transferred to [facility] for surgical consultation.   A repeat CT revealed increased free air with large abscess and diverticulitis in LLQ today. The decision was made to proceed with emergent ex lap with colectomy and colostomy. Vitamin K and FFP are currently being administered for Coumadin reversal.   Cardiac wise, he reports that he has been stable, at baseline. His atrial fibrillation has been rate-controlled, INR therapeutic with good Coumadin compliance. He denies chest pain, DOE, lightheadedness, diaphoresis, palpitations, LE edema, orthopnea, PND or decreased activity level. He is also on ASA, Cozaar, Coreg, Zocor, Lasix, Digoxin outpatient.   Inpatient Medications:     . carvedilol  12.5 mg Oral BID WC  . ciprofloxacin   400 mg Intravenous Once  . digoxin  0.125 mg Oral Daily  . ertapenem  1 g Intravenous Q24H  . furosemide  20 mg Intravenous Daily  . guaiFENesin  1,200 mg Oral QHS  . insulin aspart  0-9 Units Subcutaneous Q4H  . latanoprost  1 drop Both Eyes QHS  . losartan  100 mg Oral Daily  . metronidazole  500 mg Intravenous Once  . nitrofurantoin (macrocrystal-monohydrate)  100 mg Oral Daily  . phytonadione (VITAMIN K) IV  2 mg Intravenous Once  . simvastatin  40 mg Oral QHS  . Tamsulosin HCl  0.4 mg Oral Daily  . DISCONTD: albuterol  2.5 mg Nebulization Q6H  . DISCONTD: ciprofloxacin  400 mg Intravenous Q12H  . DISCONTD: ipratropium  0.5 mg Nebulization Q6H  . DISCONTD: metronidazole  500 mg Intravenous Q8H   Prescriptions prior to admission  Medication Sig Dispense Refill  . albuterol-ipratropium (COMBIVENT) 18-103 MCG/ACT inhaler Inhale 2 puffs into the lungs as needed. For shortness of breath      . aspirin EC 81 MG tablet Take 81 mg by mouth daily.      . budesonide-formoterol (SYMBICORT) 160-4.5 MCG/ACT inhaler Inhale 2 puffs into the lungs 2 (two) times daily.      . carvedilol (COREG) 12.5 MG tablet Take 12.5 mg  by mouth 2 (two) times daily with a meal.       . Cholecalciferol (VITAMIN D3) 1000 UNITS CAPS Take 1 capsule by mouth daily.        . digoxin (LANOXIN) 0.125 MG tablet Take 125 mcg by mouth daily.      . furosemide (LASIX) 20 MG tablet Take 20 mg by mouth daily.      Marland Kitchen guaiFENesin (MUCINEX) 600 MG 12 hr tablet Take 1,200 mg by mouth at bedtime.      . indomethacin (INDOCIN) 50 MG capsule Take 50 mg by mouth 3 (three) times daily as needed.      Marland Kitchen ipratropium-albuterol (DUONEB) 0.5-2.5 (3) MG/3ML SOLN Take 3 mLs by nebulization as needed. For shortness of breath      . losartan (COZAAR) 100 MG tablet Take 100 mg by mouth daily.       . metFORMIN (GLUCOPHAGE) 500 MG tablet Take 500 mg by mouth 2 (two) times daily with a meal.       . nitrofurantoin (MACRODANTIN) 100 MG capsule  Take 100 mg by mouth daily.      Marland Kitchen OVER THE COUNTER MEDICATION Take 1 tablet by mouth daily. Preservision Eye Vitamins      . simvastatin (ZOCOR) 40 MG tablet Take 40 mg by mouth at bedtime.        . Tamsulosin HCl (FLOMAX) 0.4 MG CAPS Take 0.4 mg by mouth daily.       Marland Kitchen warfarin (COUMADIN) 2.5 MG tablet Take 2.5 mg by mouth daily. Take 2.5 mg on Mon, Tues, Thurs,Fri, and Sun. Take 5 mg on Wed and Saturday.       Family History  Problem Relation Age of Onset  . Coronary artery disease Mother   . Stroke Father   . Heart attack Mother     x10  . Heart attack Brother     with bypass   . Heart attack Sister     with bypass   . Cancer Sister   . Cancer Brother    History   Social History  . Marital Status: Married    Spouse Name: N/A    Number of Children: N/A  . Years of Education: N/A   Occupational History  .     Social History Main Topics  . Smoking status: Former Smoker -- 4.0 packs/day for 23 years    Types: Cigarettes    Quit date: 12/23/1987  . Smokeless tobacco: Never Used  . Alcohol Use: No  . Drug Use: No  . Sexually Active: Not on file   Other Topics Concern  . Not on file   Social History Narrative   Lives in [place], [place] with wife.     Review of Systems: General: negative for chills, fever, night sweats or weight changes.  Cardiovascular: negative for chest pain, dyspnea on exertion, edema, orthopnea, palpitations, paroxysmal nocturnal dyspnea or shortness of breath Dermatological: negative for rash Respiratory: positive for shortness of breath, cough or wheezing Urologic: positive for history of hematuria  Abdominal: negative for nausea, vomiting, diarrhea, bright red blood per rectum, melena, or hematemesis Neurologic: negative for visual changes, syncope, or dizziness All other systems reviewed and are otherwise negative except as noted above.  Physical Exam: Blood pressure 137/86, pulse 104, temperature 97.9 F (36.6 C), temperature source Oral,  resp. rate 19, height 5' 4.8" (1.646 m), weight 76.9 kg (169 lb 8.5 oz), SpO2 95.00%.    General: Well developed, well nourished, NAD  Head: Normocephalic,  atraumatic, sclera non-icteric, no xanthomas  Neck: Supple. Negative for carotid bruits. JVP 8-9 cm Lungs: Clear bilaterally to auscultation without wheezes, rales, or rhonchi. Breathing is guarded. Heart: RRR with S1 S2. No murmurs, rubs, or gallops appreciated. Abdomen:  Diffusely tender to palpation, distended with distant bowel sounds. No hepatomegaly. No rebound/guarding. No obvious abdominal masses. Msk:  Strength and tone appears normal for age. Extremities: No clubbing, cyanosis or edema.  Distal pedal pulses are 2+ and equal bilaterally. Neuro: Alert and oriented X 3. Moves all extremities spontaneously. Psych:  Responds to questions appropriately with a normal affect.  Labs: Recent Labs  Basename 01/12/12 0545 01/11/12 2340   WBC 8.6 8.7   HGB 11.4* 11.7*   HCT 34.2* 34.8*   MCV 90.5 88.8   PLT 127* 121*    Lab 01/12/12 0545 01/11/12 2340  NA 138 135  K 3.7 3.6  CL 103 101  CO2 27 27  BUN 29* 29*  CREATININE 0.78 0.85  CALCIUM 8.2* 8.3*  PROT -- 5.5*  BILITOT -- 0.5  ALKPHOS -- 42  ALT -- 11  AST -- 9  AMYLASE -- --  LIPASE -- --  GLUCOSE 155* 173*   Radiology/Studies: Ct Abdomen Pelvis W Contrast  01/12/2012  **ADDENDUM** CREATED: 01/12/2012 10:15:59  Findings called directly to Dr. Gwenlyn Perking at 10:00am.  **END ADDENDUM** SIGNED BY: Cyndie Chime, M.D.    01/12/2012  *RADIOLOGY REPORT*  Clinical Data: Left lower quadrant abdominal pain.  History of diverticulitis.  CT ABDOMEN AND PELVIS WITH CONTRAST  Technique:  Multidetector CT imaging of the abdomen and pelvis was performed following the standard protocol during bolus administration of intravenous contrast.  Contrast: OMNIPAQUE IOHEXOL 300 MG/ML IV SOLN  Comparison: CT scan 02/01/2009.  Findings: The lung bases demonstrate left basilar atelectasis and  cardiac enlargement.  There is a large amount of free interperitoneal air along with a left lower quadrant/upper left pelvic 7 x 7 cm abscess.  Findings are most likely due to ruptured diverticulitis.  The solid abdominal organs are unremarkable except for stable bilateral adrenal gland adenomas and bilateral renal cysts.  The stomach, duodenum and small bowel are unremarkable.  The appendix is normal.  The aorta is normal in caliber.  Moderate atherosclerotic changes. The major branch vessels are patent.  Moderate atherosclerotic calcifications involving the branch vessels.  No mesenteric or retroperitoneal masses or adenopathy.  The bladder appears normal.  Brachytherapy prostate seeds are noted.  The bony structures are unremarkable.  IMPRESSION:  1.  Large amount of free intraperitoneal air and large left lower quadrant/upper left pelvic abscess likely due to perforated diverticulitis. 2.  Left lower lobe atelectasis. 3.  Stable adrenal gland adenomas and bilateral renal cysts.  Original Report Authenticated By: P. Loralie Champagne, M.D.   Dg Abd Acute W/chest  01/12/2012  *RADIOLOGY REPORT*  Clinical Data: Ruptured sigmoid diverticulitis.  ACUTE ABDOMEN SERIES (ABDOMEN 2 VIEW & CHEST 1 VIEW)  Comparison: CT scan, same date.  Findings: There is a large amount of free intraperitoneal air. Please see the CT scan findings. Left lower lobe atelectasis is noted.  Mild cardiac enlargement.  IMPRESSION: Large amount of free interperitoneal air.  Original Report Authenticated By: P. Loralie Champagne, M.D.   EKG: no new tracings  ASSESSMENT AND PLAN:   Mr. Wachter is a 71 yo Caucasian male with PMHx significant for CAD (s/p CABG x 2 in 1989; cath 2005- patent LIMA-LAD, SVG-OM grafts, moderate MR; stress test 2009- EF 43%, inferior  wall infarct without ischemia, normal MV function echo 01/12- LVEF 40-45%, hypokinesis of inferolateral and apical myocardium, normal functioned MV), mitral regurgitation (s/p prosthetic MVR  on chronic Coumadin), chronic atrial fibrillation, type 2 DM, HTN and HL who was recently transferred from [facility] for surgical consultation for perforated sigmoid diverticulitis.   He has a significant cardiac history and risk factors. His chronic cardiac issues have been stable and well-controlled for the past several months. His clinical condition has worsened with increased free air on recent CT. He has also progressed to peritonitis. His PT/INR has been therapeutic from a MVR/a fib standpoint. His induced coagulopathy is currently being reversed with Vitamin K and FFP. He is a high-risk surgical candidate; however, he requires emergent ex lap and the benefits outweigh the risks at this point. He will be transitioned to Heparin for anticoagulation post-operatively.   - Will replace Coreg with Lopressor 5mg  IV q4h  - Agree with Heparin transition post-op given Mechanical MV and AF  Signed, R. Hurman Horn, PA-C 01/12/2012, 12:24 PM  Patient seen with PA, agree with note.  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  He needs emergent surgery. He is having his coumadin-generated coagulopathy reversed.  He is going to be at fairly high risk of complications with his comorbidities, especially the needs to emergently reverse his elevated INR.  There is certainly risk of valve thrombosis.  He also appears somewhat volume overloaded with elevated JVP.  1. Diverticulitis with perforation: Needs emergent surgery with peritonitis.  He has been on a beta blocker pre-operatively, will change to metoprolol 5 mg IV q4 hrs since he is not taking po.  2. Mechanical MVR: No choice other than to reverse INR.  Would start heparin gtt as soon as surgery deems reasonably safe after the procedure.   3. Volume: Acute on chronic systolic CHF (EF 16-10%).  He is mildly volume  overloaded.  Will need to be careful with fluid peri-operatively.   [name] 01/12/2012 12:33 PM

## 2012-01-12 NOTE — Transfer of Care (Signed)
Immediate Anesthesia Transfer of Care Note  Patient: [name]  Procedure(s) Performed:  COLON RESECTION  Patient Location: PACU  Anesthesia Type: General  Level of Consciousness: sedated  Airway & Oxygen Therapy: Patient remains intubated per anesthesia plan  Post-op Assessment: Report given to PACU RN  Post vital signs: Reviewed and stable  Complications: No apparent anesthesia complications

## 2012-01-12 NOTE — Anesthesia Postprocedure Evaluation (Signed)
  Anesthesia Post-op Note  Patient: [name]  Procedure(s) Performed:  COLON RESECTION  Patient Location: PACU  Anesthesia Type: General  Level of Consciousness: sedated  Airway and Oxygen Therapy: Patient Spontanous Breathing  Post-op Pain: sedated  Post-op Assessment: Post-op Vital signs reviewed, Patient's Cardiovascular Status Stable, Respiratory Function Stable and No signs of Nausea or vomiting  Post-op Vital Signs: Reviewed and stable  Complications: No apparent anesthesia complications

## 2012-01-12 NOTE — Op Note (Signed)
Operative Note  [name] male 71 y.o. 01/12/2012  PREOPERATIVE DX:  Perforated colon with intraabdominal abscess  POSTOPERATIVE DX:  Same  PROCEDURE:  Exploratory laparotomy, drainage of intra-abdominal abscess, sigmoid colectomy, colostomy.         Surgeon: [name]   Assistants: [name], [name]  Anesthesia: General endotracheal anesthesia  Indications:   This is a 71 year old male treated at an outside hospital for an acute COPD exacerbation. Toward the end of that treatment he developed abdominal pain and a CT scan was performed 3 days ago which demonstrated inflammatory changes in the sigmoid colon consistent with diverticulitis as well as free intraperitoneal air. It was decided to try to treat her nonoperatively with bowel rest IV antibiotics but he did not improve and was transferred to [name] for a repeat CT scan showed a significant pneumoperitoneum and developing abscess and now is brought to the operating room for the above procedure. He is on chronic Coumadin with an elevated INR and this was corrected with vitamin K and fresh frozen plasma and preop and perioperative periods.    Procedure Detail:  He was brought to the operating room placed supine on the operating table and a general anesthetic was administered. The hair on the abdominal wall was clipped and that area was widely sterilely prepped and draped. A long midline incision was made beginning above the umbilicus and extending to the pubis dividing the skin subcutaneous tissue fascia and peritoneum. Upon entering the peritoneal cavity a fair amount of gas was released.  A pelvic abscess was located in the lower midline extending to the left lower quadrant and this was drained. Inflamed sigmoid colon which was somewhat redundant was noted. The rectosigmoid junction was identified and the this appeared to be normal. Inspecting the sigmoid colon at the proximal area  a small perforation was identified.  The  rectosigmoid junction was divided with the GIA stapler and the rectal pouch was marked with a Prolene suture. The proximal colon was divided in the mid descending colon with the GIA stapler. The mesentery was divided with the LigaSure and bleeding areas controlled with interrupted sutures. The distal end of the specimen was marked with a suture and sent to pathology.  The small bowel was inspected and no lesions were noted. The appendix appeared to be normal.  The abdominal cavity was then copiously irrigated with the fluid evacuated. A stab wound was made in the left lower quadrant and a size 19 Blake drain was placed into the left lower quadrant and pelvis where the abscess was. The drain was anchored to the skin with nylon suture.  He had been marked for a descending colostomy on the skin in the left midabdomen. I made a circular incision at this site and remove the skin and subcutaneous tissue. A cruciate incision was then made in the fascia and peritoneum and dilated to 2 fingerbreadths. The descending colon stump was then brought up through this defect. The descending colon was anchored to the anterior fascia with interrupted 2-0 Vicryl sutures.  Following this the omentum was placed over the intestine. Seprafilm was then placed over the omentum. The midline fascia was then closed with running #1 PDS double loop suture and intermittent interrupted #1 Novafil sutures. The midline wound was then covered.  The descending colostomy was then matured with interrupted 3-0 Vicryl sutures. A colostomy appliance was placed on this. The midline subcutaneous tissue and skin was left open and the wound packed with saline moistened gauze  followed by dry bulky dressing. All needle sponge and instrument counts were reported to be correct before final closure.  He tolerated the procedure fairly well without any apparent complications. He was taken to the intensive care unit intubated and in critical  condition.      Estimated Blood Loss:  200 mL         Drains: JACKSON-PRATT (JP)           Blood Given: 4 units FFP          Specimens: Sigmoid colon        Complications:  * No complications entered in OR log *         Disposition: ICU - intubated and critically ill.         Condition: stable

## 2012-01-12 NOTE — Consult Note (Signed)
Name: [name] MRN: [id number] DOB: Nov 28, 1941    LOS: 1  PCCM CONSULTATION NOTE  History of Present Illness:  Admitted to [facility] with COPD exacerbation on 1/14.  Developed abdominal pain.  CT scan on 1/18 demonstrated diverticulitis and free air.  Started on antibiotics.  Transferred to [facility] on 1/20.  Repeat  CT scan demonstrated pneumoperitoneum and developing abscess.  Brought to OR on 1/21 for exploratory laparotomy, drainage of intraabdominal abscess, sigmoid colectomy and colostomy.  Brought to ICU intubated and mechanically ventilated.   Lines / Drains: 1/21  ETT 1/21  NGT 1/21  Foley 1/21  R IJ TLC 1/21  JP drain  Cultures: None  Antibiotics: 1/18  Cipro>>> 1/21 1/18  Flagyl>>> 1/21  Ertapenem>>>  Tests / Events: 1/20  Abdominal CT>>>pneumoperitoneum / intraabdominal abscess 1/21  Exploratory laparotomy, drainage of intraabdominal abscess, sigmoid colectomy and colostomy 1/21  CXR>>>bibasilar atelectasis, ETT/TLC  The patient is sedated, intubated and unable to provide history, which was obtained for available medical records.    Past Medical History  Diagnosis Date  . Atrial fibrillation   . Hypertension   . Hyperkalemia   . Acute systolic heart failure   . Coronary artery disease     Cath 2005- patent LIMA-LAD, SVG-OM grafts, moderate MR  . Prostate cancer    Past Surgical History  Procedure Date  . Bypass graft 1989    LIMA-LAD, SVG-OM  . Mitral valve replacement 2007    Mechanical, performed at [facility]  . Hernia repair   . External ear surgery     drum   Prior to Admission medications   Medication Sig Start Date End Date Taking? Authorizing Provider  albuterol-ipratropium (COMBIVENT) 18-103 MCG/ACT inhaler Inhale 2 puffs into the lungs as needed. For shortness of breath   Yes Historical Provider, MD  aspirin EC 81 MG tablet Take 81 mg by mouth daily.   Yes Historical Provider, MD  budesonide-formoterol (SYMBICORT) 160-4.5 MCG/ACT inhaler  Inhale 2 puffs into the lungs 2 (two) times daily.   Yes Historical Provider, MD  carvedilol (COREG) 12.5 MG tablet Take 12.5 mg by mouth 2 (two) times daily with a meal.  07/31/11  Yes Historical Provider, MD  Cholecalciferol (VITAMIN D3) 1000 UNITS CAPS Take 1 capsule by mouth daily.     Yes Historical Provider, MD  digoxin (LANOXIN) 0.125 MG tablet Take 125 mcg by mouth daily.   Yes Historical Provider, MD  furosemide (LASIX) 20 MG tablet Take 20 mg by mouth daily.   Yes Historical Provider, MD  guaiFENesin (MUCINEX) 600 MG 12 hr tablet Take 1,200 mg by mouth at bedtime.   Yes Historical Provider, MD  indomethacin (INDOCIN) 50 MG capsule Take 50 mg by mouth 3 (three) times daily as needed.   Yes Historical Provider, MD  ipratropium-albuterol (DUONEB) 0.5-2.5 (3) MG/3ML SOLN Take 3 mLs by nebulization as needed. For shortness of breath   Yes Historical Provider, MD  losartan (COZAAR) 100 MG tablet Take 100 mg by mouth daily.  07/30/11  Yes Historical Provider, MD  metFORMIN (GLUCOPHAGE) 500 MG tablet Take 500 mg by mouth 2 (two) times daily with a meal.  08/26/11  Yes Historical Provider, MD  nitrofurantoin (MACRODANTIN) 100 MG capsule Take 100 mg by mouth daily.   Yes Historical Provider, MD  OVER THE COUNTER MEDICATION Take 1 tablet by mouth daily. Preservision Eye Vitamins   Yes Historical Provider, MD  simvastatin (ZOCOR) 40 MG tablet Take 40 mg by mouth at bedtime.  Yes Historical Provider, MD  Tamsulosin HCl (FLOMAX) 0.4 MG CAPS Take 0.4 mg by mouth daily.  07/30/11  Yes Historical Provider, MD  warfarin (COUMADIN) 2.5 MG tablet Take 2.5 mg by mouth daily. Take 2.5 mg on Mon, Tues, Thurs,Fri, and Sun. Take 5 mg on Wed and Saturday.   Yes Historical Provider, MD   Allergies Allergies  Allergen Reactions  . Codeine     REACTION: hives  . Sulfonamide Derivatives     REACTION: hives  . Tylenol-Codeine     REACTION: hives   Family History Family History  Problem Relation Age of Onset  .  Coronary artery disease Mother   . Stroke Father   . Heart attack Mother     x10  . Heart attack Brother     with bypass   . Heart attack Sister     with bypass   . Cancer Sister   . Cancer Brother    Social History  reports that he quit smoking about 24 years ago. His smoking use included Cigarettes. He has a 92 pack-year smoking history. He has never used smokeless tobacco. He reports that he does not drink alcohol or use illicit drugs.  Review Of Systems  11 points review of systems is negative with an exception of listed in HPI.  Vital Signs: Temp:  [97.4 F (36.3 C)-99 F (37.2 C)] 97.4 F (36.3 C) (01/21 1706) Pulse Rate:  [80-120] 120  (01/21 1706) Resp:  [18-20] 20  (01/21 1706) BP: (107-155)/(64-89) 143/89 mmHg (01/21 1706) SpO2:  [95 %-98 %] 98 % (01/21 1706) FiO2 (%):  [2 %] 2 % (01/21 1706) Weight:  [76.9 kg (169 lb 8.5 oz)] 76.9 kg (169 lb 8.5 oz) (01/20 2218) I/O last 3 completed shifts: In: 2900.6 [P.O.:240; I.V.:1064.6; Blood:1294; IV Piggyback:302] Out: 2450 [Urine:2250; Blood:200]  Physical Examination: General:  Mechanically ventilated, not in distress Neuro:  Sedated, nonfocal, synchronous with ventilator HEENT:  NGT, ETT Neck:  No JVD   Cardiovascular: Irregularly irregular rate, no murmurs Lungs:  Bilateral diminished air entry, no wheezing Abdomen:  Viable colostomy, surgically dressing clean / dry / intact Musculoskeletal: No edema Skin:  No rash  Ventilator settings: Vent Mode:  [-]  FiO2 (%):  [2 %] 2 %  Labs and Imaging:  Reviewed.  Please refer to the Assessment and Plan section for relevant results.  Assessment and Plan:  Intraabdominal abscess, s/p eploratory laparotomy, drainage of intraabdominal abscess, sigmoid colectomy and colostomy.  No signs of sepsis.  Lab 01/12/12 0545 01/11/12 2340  WBC 8.6 8.7   -->  Antibiotics as above -->  Surgery following  Encephalopathy secondary to general anesthesia / sedation.  Good pain  control.  Synchronous with ventilator. -->  Propofol for RASS 0 to -1 -->  Fentanyl boluses PRN pain -->  Daily WUA  Post-operative respiratory failure.  History of COPD.  No evidence of acute bronchospasm.  Lab 01/12/12 2143  PHART 7.422  PCO2ART 46.1*  PO2ART 117.0*   -->  Full mechanical support -->  Goal pH > 7.30, goal SpO2 > 92 -->  F/u ABG -->  F/u CXR -->  Daily SBT -->  Bronchodilators -->  Avoid steroids as recent surgery and no active bronchospasm  History of CAD s/p CABG.  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  Ischemic cardiomyopathy.  Atrial fibrillation.  No signs of active ischemia.  Atrial fibrillation with controlled rate.  Dyslipidemia. -->  Hold ASA, Coreg, Lqsix, Losartan, Simvastatin -->  Heparin  per pharmacy 12 hours after surgery -->  Digoxin / Metoprolol IV -->  IVF to [facility] -->  CVP  Hematemesis is on the record but no active hematemesis noted and no bloody discharge from NG.  This is on the background of Coumadin, ASA and Indocin.  Stable hemoglobin.  Lab 01/12/12 0545 01/11/12 2340  HCT 34.2* 34.8*   -->  Protonix q12h  Coumadin induced coagulopathy, s/p FFP / Vit K  Lab 01/12/12 1542 01/12/12 0545 01/11/12 2340  INR 1.52* 2.53* 2.65*   -->  Heparin per pharmacy as above  Diabetes / hyperglycemia  Lab 01/12/12 1638 01/12/12 1229 01/12/12 0739 01/12/12 0533 01/11/12 2359  GLUCAP 155* 160* 144* 130* 159*   -->  Hold Metformin -->  SSI  Best practices / Disposition -->  ICU status under PCCM -->  Surgery following -->  Full code -->  SCDs for DVT Px -->  Protonix for GI Px -->  Ventilator bundle -->  NPO -->  Family is not available for update  The patient is critically ill with multiple organ systems failure and requires high complexity decision making for assessment and support, frequent evaluation and titration of therapies, application of advanced monitoring technologies and extensive interpretation of multiple  databases. Critical Care Time devoted to patient care services described in this note is 45 minutes.  Orlean Bradford, M.D. Pulmonary and Critical Care Medicine [facility] Cell: [phone] Pager: [phone]  01/12/2012, 8:59 PM

## 2012-01-12 NOTE — Anesthesia Preprocedure Evaluation (Signed)
Anesthesia Evaluation  Patient identified by MRN, date of birth, ID band Patient awake    Reviewed: Allergy & Precautions, H&P , NPO status , Patient's Chart, lab work & pertinent test results  Airway Mallampati: II  Neck ROM: full    Dental   Pulmonary COPD         Cardiovascular hypertension, + CAD + dysrhythmias Atrial Fibrillation + Valvular Problems/Murmurs     Neuro/Psych    GI/Hepatic   Endo/Other  Diabetes mellitus-, Type 2  Renal/GU      Musculoskeletal   Abdominal   Peds  Hematology   Anesthesia Other Findings   Reproductive/Obstetrics                           Anesthesia Physical Anesthesia Plan  ASA: III  Anesthesia Plan: General   Post-op Pain Management:    Induction: Intravenous  Airway Management Planned: Oral ETT  Additional Equipment:   Intra-op Plan:   Post-operative Plan: Extubation in OR  Informed Consent: I have reviewed the patients History and Physical, chart, labs and discussed the procedure including the risks, benefits and alternatives for the proposed anesthesia with the patient or authorized representative who has indicated his/her understanding and acceptance.     Plan Discussed with: CRNA and Surgeon  Anesthesia Plan Comments:         Anesthesia Quick Evaluation

## 2012-01-12 NOTE — Progress Notes (Signed)
Utilization Review Completed.[name] T1/21/2013   

## 2012-01-12 NOTE — Progress Notes (Signed)
His INR is now 1.52. I feel it is safe nesting point to proceed with surgery and give him fresh frozen plasma at the time of surgery. I discussed this with him and his family.

## 2012-01-12 NOTE — Progress Notes (Signed)
Subjective: Pt ok. C/o pain across low abdomen. No worse than it has been, but no better. Denies N/V and has been tolerating clears. Drinking contrast for CT  Objective: Vital signs in last 24 hours: Temp:  [97.9 F (36.6 C)-98.1 F (36.7 C)] 97.9 F (36.6 C) (01/21 1006) Pulse Rate:  [80-101] 101  (01/21 1006) Resp:  [19-20] 19  (01/21 1006) BP: (107-137)/(68-86) 137/86 mmHg (01/21 1006) SpO2:  [95 %-98 %] 95 % (01/21 1006) Weight:  [76.9 kg (169 lb 8.5 oz)] 76.9 kg (169 lb 8.5 oz) (01/20 2218) Last BM Date: 01/05/12  Intake/Output this shift:    Physical Exam: BP 137/86  Pulse 101  Temp(Src) 97.9 F (36.6 C) (Oral)  Resp 19  Ht 5' 4.8" (1.646 m)  Wt 76.9 kg (169 lb 8.5 oz)  BMI 28.39 kg/m2  SpO2 95% Abdomen: +distention. Very Tender across low abdomen without peritonitis., no BS  Labs: CBC  Basename 01/12/12 0545 01/11/12 2340  WBC 8.6 8.7  HGB 11.4* 11.7*  HCT 34.2* 34.8*  PLT 127* 121*   BMET  Basename 01/12/12 0545 01/11/12 2340  NA 138 135  K 3.7 3.6  CL 103 101  CO2 27 27  GLUCOSE 155* 173*  BUN 29* 29*  CREATININE 0.78 0.85  CALCIUM 8.2* 8.3*   LFT  Basename 01/11/12 2340  PROT 5.5*  ALBUMIN 2.4*  AST 9  ALT 11  ALKPHOS 42  BILITOT 0.5  BILIDIR --  IBILI --  LIPASE --   PT/INR  Basename 01/12/12 0545 01/11/12 2340  LABPROT 27.7* 28.7*  INR 2.53* 2.65*   ABG No results found for this basename: PHART:2,PCO2:2,PO2:2,HCO3:2 in the last 72 hours  Studies/Results: Ct Abdomen Pelvis W Contrast  01/12/2012  **ADDENDUM** CREATED: 01/12/2012 10:15:59  Findings called directly to Dr. Gwenlyn Perking at 10:00am.  **END ADDENDUM** SIGNED BY: Cyndie Chime, M.D.    01/12/2012  *RADIOLOGY REPORT*  Clinical Data: Left lower quadrant abdominal pain.  History of diverticulitis.  CT ABDOMEN AND PELVIS WITH CONTRAST  Technique:  Multidetector CT imaging of the abdomen and pelvis was performed following the standard protocol during bolus administration of  intravenous contrast.  Contrast: OMNIPAQUE IOHEXOL 300 MG/ML IV SOLN  Comparison: CT scan 02/01/2009.  Findings: The lung bases demonstrate left basilar atelectasis and cardiac enlargement.  There is a large amount of free interperitoneal air along with a left lower quadrant/upper left pelvic 7 x 7 cm abscess.  Findings are most likely due to ruptured diverticulitis.  The solid abdominal organs are unremarkable except for stable bilateral adrenal gland adenomas and bilateral renal cysts.  The stomach, duodenum and small bowel are unremarkable.  The appendix is normal.  The aorta is normal in caliber.  Moderate atherosclerotic changes. The major branch vessels are patent.  Moderate atherosclerotic calcifications involving the branch vessels.  No mesenteric or retroperitoneal masses or adenopathy.  The bladder appears normal.  Brachytherapy prostate seeds are noted.  The bony structures are unremarkable.  IMPRESSION:  1.  Large amount of free intraperitoneal air and large left lower quadrant/upper left pelvic abscess likely due to perforated diverticulitis. 2.  Left lower lobe atelectasis. 3.  Stable adrenal gland adenomas and bilateral renal cysts.  Original Report Authenticated By: P. Loralie Champagne, M.D.   Dg Abd Acute W/chest  01/12/2012  *RADIOLOGY REPORT*  Clinical Data: Ruptured sigmoid diverticulitis.  ACUTE ABDOMEN SERIES (ABDOMEN 2 VIEW & CHEST 1 VIEW)  Comparison: CT scan, same date.  Findings: There is a large  amount of free intraperitoneal air. Please see the CT scan findings. Left lower lobe atelectasis is noted.  Mild cardiac enlargement.  IMPRESSION: Large amount of free interperitoneal air.  Original Report Authenticated By: P. Loralie Champagne, M.D.    Assessment: Principal Problem:  *Diverticulitis of large intestine with perforation Pneumoperitoneum Hx a.fib, CAD, DM   Plan: Change to IV Invanz. CT shows significant worsening of free air with large abscess and diverticultis in  LLQ. He will need ex lap with colectomy and colostomy. Will need to reverse Coumadin, will give Vit K and 2 FFP and recheck INR after. D/W pt and family need for surgical intervention, including colostomy. Explained procedure, including risks of infection, bleeding, and potential post-op complications including respiratory issues, cardiac issues, renal failure, poor wound healing,and deconditioning. Will ask [place] cards to eval pre-op while receiving blood products.  LOS: 1 day    Marianna Fuss 01/12/2012

## 2012-01-12 NOTE — Progress Notes (Signed)
PAtient arrived on 5500 via [facility].  He was and is alert and oriented times four.  From home with wife.  VS WNL on arrival.  He is on 2L [place].  Patient complained of pain otherwise stable.  POC: Pain control.  Will continue to monitor patient. Marsalis Beaulieu, Joan Mayans, RN

## 2012-01-12 NOTE — Consult Note (Signed)
WOC ostomy consult  Consult requested to mark preoperatively for possible colostomy.  Mark placed within rectus muscles, in line of vision, in area free from folds to left upper quad.  Discussed pouching routines briefly and will follow postop for teaching if colostomy is performed.  Mark placed 5 cm to right of umbilicus and 4 cm above. Family at bedside talking with Dr Abbey Chatters.   Cammie Mcgee, RN, MSN, [employer]  [phone]

## 2012-01-12 NOTE — Progress Notes (Signed)
Subjective: Patient with significant pain in his abdomen and also chills overnight. CT abdomen demonstrating large amount of air and also left pelvic abscess formation.  Objective: Vital signs in last 24 hours: Temp:  [97.9 F (36.6 C)-98.1 F (36.7 C)] 97.9 F (36.6 C) (01/21 1006) Pulse Rate:  [80-104] 104  (01/21 1133) Resp:  [19-20] 19  (01/21 1006) BP: (107-137)/(68-86) 137/86 mmHg (01/21 1006) SpO2:  [95 %-98 %] 95 % (01/21 1006) Weight:  [76.9 kg (169 lb 8.5 oz)] 76.9 kg (169 lb 8.5 oz) (01/20 2218) Weight change:  Last BM Date: 01/05/12  Intake/Output from previous day: 01/20 0701 - 01/21 0700 In: 750.8 [P.O.:240; I.V.:310.8; IV Piggyback:200] Out: 600 [Urine:600]    Physical Exam: General: Alert, awake, oriented x3, in significant distress. HEENT: No bruits, no goiter. Heart: Irregular; no rubs, no gallops. No JVD Lungs:Scattered ronchi. Abdomen: Tense, distended and with significant tenderness on palpation; positive but decreased  bowel sounds. Extremities: No clubbing cyanosis or edema with positive pedal pulses. Neuro: Grossly intact, nonfocal.  Lab Results: Basic Metabolic Panel:  Basename 01/12/12 0545 01/11/12 2340  NA 138 135  K 3.7 3.6  CL 103 101  CO2 27 27  GLUCOSE 155* 173*  BUN 29* 29*  CREATININE 0.78 0.85  CALCIUM 8.2* 8.3*  MG -- --  PHOS -- --   Liver Function Tests:  Basename 01/11/12 2340  AST 9  ALT 11  ALKPHOS 42  BILITOT 0.5  PROT 5.5*  ALBUMIN 2.4*   CBC:  Basename 01/12/12 0545 01/11/12 2340  WBC 8.6 8.7  NEUTROABS -- 7.9*  HGB 11.4* 11.7*  HCT 34.2* 34.8*  MCV 90.5 88.8  PLT 127* 121*   CBG:  Basename 01/12/12 0739 01/12/12 0533 01/11/12 2359 01/11/12 2229  GLUCAP 144* 130* 159* 194*   Coagulation:  Basename 01/12/12 0545 01/11/12 2340  LABPROT 27.7* 28.7*  INR 2.53* 2.65*   Urine Drug Screen: Drugs of Abuse  No results found for this basename: labopia, cocainscrnur, labbenz, amphetmu, thcu, labbarb      Studies/Results: Ct Abdomen Pelvis W Contrast  01/12/2012  **ADDENDUM** CREATED: 01/12/2012 10:15:59  Findings called directly to Dr. Gwenlyn Perking at 10:00am.  **END ADDENDUM** SIGNED BY: Cyndie Chime, M.D.    01/12/2012  *RADIOLOGY REPORT*  Clinical Data: Left lower quadrant abdominal pain.  History of diverticulitis.  CT ABDOMEN AND PELVIS WITH CONTRAST  Technique:  Multidetector CT imaging of the abdomen and pelvis was performed following the standard protocol during bolus administration of intravenous contrast.  Contrast: OMNIPAQUE IOHEXOL 300 MG/ML IV SOLN  Comparison: CT scan 02/01/2009.  Findings: The lung bases demonstrate left basilar atelectasis and cardiac enlargement.  There is a large amount of free interperitoneal air along with a left lower quadrant/upper left pelvic 7 x 7 cm abscess.  Findings are most likely due to ruptured diverticulitis.  The solid abdominal organs are unremarkable except for stable bilateral adrenal gland adenomas and bilateral renal cysts.  The stomach, duodenum and small bowel are unremarkable.  The appendix is normal.  The aorta is normal in caliber.  Moderate atherosclerotic changes. The major branch vessels are patent.  Moderate atherosclerotic calcifications involving the branch vessels.  No mesenteric or retroperitoneal masses or adenopathy.  The bladder appears normal.  Brachytherapy prostate seeds are noted.  The bony structures are unremarkable.  IMPRESSION:  1.  Large amount of free intraperitoneal air and large left lower quadrant/upper left pelvic abscess likely due to perforated diverticulitis. 2.  Left lower lobe  atelectasis. 3.  Stable adrenal gland adenomas and bilateral renal cysts.  Original Report Authenticated By: P. Loralie Champagne, M.D.   Dg Abd Acute W/chest  01/12/2012  *RADIOLOGY REPORT*  Clinical Data: Ruptured sigmoid diverticulitis.  ACUTE ABDOMEN SERIES (ABDOMEN 2 VIEW & CHEST 1 VIEW)  Comparison: CT scan, same date.  Findings: There is  a large amount of free intraperitoneal air. Please see the CT scan findings. Left lower lobe atelectasis is noted.  Mild cardiac enlargement.  IMPRESSION: Large amount of free interperitoneal air.  Original Report Authenticated By: P. Loralie Champagne, M.D.    Medications: Scheduled Meds:   . carvedilol  12.5 mg Oral BID WC  . ciprofloxacin  400 mg Intravenous Once  . digoxin  0.125 mg Oral Daily  . ertapenem  1 g Intravenous Q24H  . furosemide  20 mg Intravenous Daily  . guaiFENesin  1,200 mg Oral QHS  . insulin aspart  0-9 Units Subcutaneous Q4H  . latanoprost  1 drop Both Eyes QHS  . losartan  100 mg Oral Daily  . metronidazole  500 mg Intravenous Once  . nitrofurantoin (macrocrystal-monohydrate)  100 mg Oral Daily  . phytonadione (VITAMIN K) IV  2 mg Intravenous Once  . simvastatin  40 mg Oral QHS  . Tamsulosin HCl  0.4 mg Oral Daily  . DISCONTD: albuterol  2.5 mg Nebulization Q6H  . DISCONTD: ciprofloxacin  400 mg Intravenous Q12H  . DISCONTD: ipratropium  0.5 mg Nebulization Q6H  . DISCONTD: metronidazole  500 mg Intravenous Q8H   Continuous Infusions:   . sodium chloride 50 mL/hr at 01/11/12 2347  . pantoprozole (PROTONIX) infusion 8 mg/hr (01/12/12 0101)   PRN Meds:.acetaminophen, acetaminophen, albuterol, HYDROmorphone, iohexol, ipratropium, DISCONTD: zolpidem  Assessment/Plan: 1-Large intestine diverticulitis with perforation and abscess formation: Surgery planing to operate on him emergently; patient high risk but no other options of tx available at this point. Will follow recommendations and outcome.  2-CAD, A. Fib and mitral valve replacement (mechanical): will hold to coumadin and reverse him for surgery with vit K and FFP; 12 hours after tx start heparin drip. While NPO will use lopressor 5 mg IV q4H; cardiology on board and helping management.  3-CHF: EF 40-45%; will be cautious with fluids; can easily develop pulmonar edema. Daily weight and strict I's and O's.  Will place foley.  4-DM:continue SSI  5-HTN: stable; will use hydralazine IV now that he is NPO.  Dispo: patient will be moved to Step down and medications will be changed to IV.    LOS: 1 day   Billiejo Sorto [facility] [phone]  01/12/2012, 11:55 AM

## 2012-01-12 NOTE — Progress Notes (Signed)
Patient received first cup of contrast for CT/instructed on the need to finish drinking the whole cup in one hour and he will be given his second cup.  Patient stated he verbally understood the instructions. Macarthur Critchley, RN

## 2012-01-13 ENCOUNTER — Encounter (HOSPITAL_COMMUNITY): Payer: Self-pay | Admitting: General Surgery

## 2012-01-13 DIAGNOSIS — I059 Rheumatic mitral valve disease, unspecified: Secondary | ICD-10-CM | POA: Diagnosis not present

## 2012-01-13 DIAGNOSIS — D684 Acquired coagulation factor deficiency: Secondary | ICD-10-CM | POA: Diagnosis not present

## 2012-01-13 DIAGNOSIS — J9600 Acute respiratory failure, unspecified whether with hypoxia or hypercapnia: Secondary | ICD-10-CM | POA: Diagnosis not present

## 2012-01-13 DIAGNOSIS — A419 Sepsis, unspecified organism: Secondary | ICD-10-CM | POA: Diagnosis present

## 2012-01-13 DIAGNOSIS — K651 Peritoneal abscess: Secondary | ICD-10-CM | POA: Diagnosis not present

## 2012-01-13 DIAGNOSIS — G9340 Encephalopathy, unspecified: Secondary | ICD-10-CM | POA: Diagnosis not present

## 2012-01-13 LAB — GLUCOSE, CAPILLARY
Glucose-Capillary: 118 mg/dL — ABNORMAL HIGH (ref 70–99)
Glucose-Capillary: 126 mg/dL — ABNORMAL HIGH (ref 70–99)
Glucose-Capillary: 119 mg/dL — ABNORMAL HIGH (ref 70–99)
Glucose-Capillary: 138 mg/dL — ABNORMAL HIGH (ref 70–99)
Glucose-Capillary: 121 mg/dL — ABNORMAL HIGH (ref 70–99)

## 2012-01-13 LAB — HEPARIN LEVEL (UNFRACTIONATED): Heparin Unfractionated: <0.1 IU/mL — ABNORMAL LOW (ref 0.30–0.70)

## 2012-01-13 LAB — EXPECTORATED SPUTUM ASSESSMENT W GRAM STAIN, RFLX TO RESP C

## 2012-01-13 LAB — BASIC METABOLIC PANEL
BUN: 27 mg/dL — ABNORMAL HIGH (ref 6–23)
CO2: 32 mEq/L (ref 19–32)
Calcium: 8.2 mg/dL — ABNORMAL LOW (ref 8.4–10.5)
Creatinine, Ser: 1.06 mg/dL (ref 0.50–1.35)
Glucose, Bld: 122 mg/dL — ABNORMAL HIGH (ref 70–99)

## 2012-01-13 LAB — CBC
HCT: 33.1 % — ABNORMAL LOW (ref 39.0–52.0)
Hemoglobin: 11.1 g/dL — ABNORMAL LOW (ref 13.0–17.0)
RDW: 15.6 % — ABNORMAL HIGH (ref 11.5–15.5)
WBC: 9.6 10*3/uL (ref 4.0–10.5)

## 2012-01-13 LAB — PROTIME-INR
INR: 1.36 (ref 0.00–1.49)
Prothrombin Time: 17 seconds — ABNORMAL HIGH (ref 11.6–15.2)

## 2012-01-13 LAB — PHOSPHORUS: Phosphorus: 3.8 mg/dL (ref 2.3–4.6)

## 2012-01-13 MED ORDER — SODIUM CHLORIDE 0.9 % IV BOLUS (SEPSIS)
500.0000 mL | Freq: Once | INTRAVENOUS | Status: AC
Start: 2012-01-13 — End: 2012-01-13
  Administered 2012-01-13: 500 mL via INTRAVENOUS

## 2012-01-13 MED ORDER — POTASSIUM CHLORIDE 10 MEQ/100ML IV SOLN
10.0000 meq | INTRAVENOUS | Status: AC
  Administered 2012-01-13 (×4): 10 meq via INTRAVENOUS

## 2012-01-13 MED ORDER — POTASSIUM CHLORIDE 10 MEQ/100ML IV SOLN
INTRAVENOUS | Status: AC
  Administered 2012-01-13: 10 meq via INTRAVENOUS
  Filled 2012-01-13: qty 400

## 2012-01-13 MED ORDER — HEPARIN SOD (PORCINE) IN D5W 100 UNIT/ML IV SOLN
1750.0000 [IU]/h | INTRAVENOUS | Status: DC
  Administered 2012-01-13: 1100 [IU]/h via INTRAVENOUS
  Administered 2012-01-14 – 2012-01-16 (×5): 1600 [IU]/h via INTRAVENOUS
  Filled 2012-01-13 (×8): qty 250

## 2012-01-13 MED ORDER — LEVALBUTEROL HCL 0.63 MG/3ML IN NEBU
0.6300 mg | INHALATION_SOLUTION | Freq: Four times a day (QID) | RESPIRATORY_TRACT | Status: DC
  Administered 2012-01-13 – 2012-01-26 (×51): 0.63 mg via RESPIRATORY_TRACT
  Filled 2012-01-13 (×58): qty 3

## 2012-01-13 MED ORDER — ACETAMINOPHEN 650 MG RE SUPP
650.0000 mg | RECTAL | Status: DC | PRN
  Administered 2012-01-13 (×2): 650 mg via RECTAL
  Filled 2012-01-13 (×2): qty 1

## 2012-01-13 MED ORDER — IPRATROPIUM BROMIDE 0.02 % IN SOLN
0.5000 mg | Freq: Four times a day (QID) | RESPIRATORY_TRACT | Status: DC
  Administered 2012-01-13 – 2012-01-26 (×51): 0.5 mg via RESPIRATORY_TRACT
  Filled 2012-01-13 (×52): qty 2.5

## 2012-01-13 NOTE — Progress Notes (Signed)
eLink Physician-Brief Progress Note Patient Name: [name] DOB: 21-Oct-1941 MRN: [id number]  Date of Service  01/13/2012   HPI/Events of Note  Hypokalemia   eICU Interventions  Potassium replaced   Intervention Category Intermediate Interventions: Electrolyte abnormality - evaluation and management  DETERDING,ELIZABETH 01/13/2012, 5:09 AM

## 2012-01-13 NOTE — Progress Notes (Signed)
UR Completed.  [name] [name] [phone] 11/22/2012  

## 2012-01-13 NOTE — Progress Notes (Signed)
ANTICOAGULATION CONSULT NOTE - Follow Up Consult  Pharmacy Consult for heparin Indication: atrial fibrillation  Allergies  Allergen Reactions  . Codeine     REACTION: hives  . Sulfonamide Derivatives     REACTION: hives  . Tylenol-Codeine     REACTION: hives    Patient Measurements: Height: 5' 4.8" (164.6 cm) Weight: 170 lb 6.7 oz (77.3 kg) IBW/kg (Calculated) : 61.04   Vital Signs: Temp: 99.4 F (37.4 C) (01/22 1906) Temp src: Oral (01/22 1906) BP: 138/51 mmHg (01/22 2352) Pulse Rate: 121  (01/22 2352)  Labs:  Basename 01/13/12 2300 01/13/12 1400 01/13/12 0420 01/12/12 1542 01/12/12 0545 01/11/12 2340  HGB -- -- 11.1* -- 11.4* --  HCT -- -- 33.1* -- 34.2* 34.8*  PLT -- -- 122* -- 127* 121*  APTT -- -- -- -- -- --  LABPROT -- -- 17.0* 18.6* 27.7* --  INR -- -- 1.36 1.52* 2.53* --  HEPARINUNFRC 0.23* <0.10* -- -- -- --  CREATININE -- -- 1.06 -- 0.78 0.85  CKTOTAL -- -- -- -- -- --  CKMB -- -- -- -- -- --  TROPONINI -- -- -- -- -- --   Estimated Creatinine Clearance: 60.1 ml/min (by C-G formula based on Cr of 1.06).   Medications:  Scheduled:    . antiseptic oral rinse  1 application Mouth Rinse QID  . chlorhexidine  15 mL Mouth/Throat BID  . digoxin  0.125 mg Intravenous Daily  . ertapenem  1 g Intravenous Q24H  . HYDROmorphone      . insulin aspart  0-20 Units Subcutaneous Q4H  . ipratropium  0.5 mg Nebulization Q6H  . latanoprost  1 drop Both Eyes QHS  . levalbuterol  0.63 mg Nebulization Q6H  . metoprolol  5 mg Intravenous Q4H  . pantoprazole (PROTONIX) IV  40 mg Intravenous Q12H  . potassium chloride  10 mEq Intravenous Q1 Hr x 4  . sodium chloride  500 mL Intravenous Once  . DISCONTD: albuterol-ipratropium  6 puff Inhalation Q4H   Infusions:    . heparin 1,400 Units/hr (01/13/12 1514)  . lactated ringers 20 mL/hr at 01/12/12 2215  . propofol 5 mcg/kg/min (01/13/12 1300)    Assessment: 71yo male remains subtherapeutic on heparin for Afib  though level is increasing.  Goal of Therapy:  Heparin level 0.3-0.7 units/ml   Plan:  Will increase gtt by ~2-3 units/kg/hr to 1600 units/hr and check level in 6-8hr.  Colleen Can PharmD BCPS 01/13/2012,11:56 PM

## 2012-01-13 NOTE — Progress Notes (Signed)
1 Day Post-Op  Subjective: On vent. Awake.  Objective: Vital signs in last 24 hours: Temp:  [97.4 F (36.3 C)-102 F (38.9 C)] 99.4 F (37.4 C) (01/22 0705) Pulse Rate:  [69-121] 119  (01/22 0800) Resp:  [14-31] 31  (01/22 0800) BP: (90-155)/(40-94) 98/74 mmHg (01/22 0800) SpO2:  [95 %-100 %] 97 % (01/22 0800) FiO2 (%):  [2 %-40 %] 40 % (01/22 0800) Weight:  [170 lb 6.7 oz (77.3 kg)-171 lb 4.8 oz (77.7 kg)] 170 lb 6.7 oz (77.3 kg) (01/22 0400) Last BM Date: 01/05/12  Intake/Output from previous day: 01/21 0701 - 01/22 0700 In: 3687.8 [I.V.:1716.8; Blood:1609; NG/GT:50; IV Piggyback:312] Out: 2630 [Urine:2135; Drains:295; Blood:200] Intake/Output this shift: Total I/O In: 156.3 [I.V.:26.3; NG/GT:30; IV Piggyback:100] Out: 15 [Urine:15]  PE: Abd-slightly firm and distended, dressing dry, colostomy edematous with some bloody output, serosanguinous drain output.  Lab Results:   Basename 01/13/12 0420 01/12/12 0545  WBC 9.6 8.6  HGB 11.1* 11.4*  HCT 33.1* 34.2*  PLT 122* 127*   BMET  Basename 01/13/12 0420 01/12/12 0545  NA 140 138  K 3.4* 3.7  CL 100 103  CO2 32 27  GLUCOSE 122* 155*  BUN 27* 29*  CREATININE 1.06 0.78  CALCIUM 8.2* 8.2*   PT/INR  Basename 01/13/12 0420 01/12/12 1542  LABPROT 17.0* 18.6*  INR 1.36 1.52*   Comprehensive Metabolic Panel:    Component Value Date/Time   NA 140 01/13/2012 0420   K 3.4* 01/13/2012 0420   CL 100 01/13/2012 0420   CO2 32 01/13/2012 0420   BUN 27* 01/13/2012 0420   CREATININE 1.06 01/13/2012 0420   GLUCOSE 122* 01/13/2012 0420   CALCIUM 8.2* 01/13/2012 0420   AST 9 01/11/2012 2340   ALT 11 01/11/2012 2340   ALKPHOS 42 01/11/2012 2340   BILITOT 0.5 01/11/2012 2340   PROT 5.5* 01/11/2012 2340   ALBUMIN 2.4* 01/11/2012 2340     Studies/Results: Ct Abdomen Pelvis W Contrast  01/12/2012  **ADDENDUM** CREATED: 01/12/2012 10:15:59  Findings called directly to Dr. Gwenlyn Perking at 10:00am.  **END ADDENDUM** SIGNED BY: Cyndie Chime, M.D.    01/12/2012  *RADIOLOGY REPORT*  Clinical Data: Left lower quadrant abdominal pain.  History of diverticulitis.  CT ABDOMEN AND PELVIS WITH CONTRAST  Technique:  Multidetector CT imaging of the abdomen and pelvis was performed following the standard protocol during bolus administration of intravenous contrast.  Contrast: OMNIPAQUE IOHEXOL 300 MG/ML IV SOLN  Comparison: CT scan 02/01/2009.  Findings: The lung bases demonstrate left basilar atelectasis and cardiac enlargement.  There is a large amount of free interperitoneal air along with a left lower quadrant/upper left pelvic 7 x 7 cm abscess.  Findings are most likely due to ruptured diverticulitis.  The solid abdominal organs are unremarkable except for stable bilateral adrenal gland adenomas and bilateral renal cysts.  The stomach, duodenum and small bowel are unremarkable.  The appendix is normal.  The aorta is normal in caliber.  Moderate atherosclerotic changes. The major branch vessels are patent.  Moderate atherosclerotic calcifications involving the branch vessels.  No mesenteric or retroperitoneal masses or adenopathy.  The bladder appears normal.  Brachytherapy prostate seeds are noted.  The bony structures are unremarkable.  IMPRESSION:  1.  Large amount of free intraperitoneal air and large left lower quadrant/upper left pelvic abscess likely due to perforated diverticulitis. 2.  Left lower lobe atelectasis. 3.  Stable adrenal gland adenomas and bilateral renal cysts.  Original Report Authenticated By: P. MARK  Pecolia Ades, M.D.   Dg Chest Port 1 View  01/12/2012  *RADIOLOGY REPORT*  Clinical Data: Status post bowel resection.  PORTABLE CHEST - 1 VIEW  Comparison: PA and lateral chest 03/18/2006.  Findings: The patient has a right IJ catheter with the tip in the right atrium.  The line could be withdrawn 3.5 cm for better positioning.  Endotracheal tube is in place the tip in good position at the level of the clavicular heads.   NG tube courses into the stomach and below the inferior margin of the film.  The patient has basilar atelectatic change, greater on the left. No pneumothorax.  There is cardiomegaly.  IMPRESSION:  1.  Right IJ catheter tip is in the right antrum.  The line could be withdrawn 3.5 cm for better positioning. 2.  ET tube in good position. 3.  Left worse than right basilar atelectasis.  Original Report Authenticated By: Bernadene Bell. D'ALESSIO, M.D.   Dg Abd Acute W/chest  01/12/2012  *RADIOLOGY REPORT*  Clinical Data: Ruptured sigmoid diverticulitis.  ACUTE ABDOMEN SERIES (ABDOMEN 2 VIEW & CHEST 1 VIEW)  Comparison: CT scan, same date.  Findings: There is a large amount of free intraperitoneal air. Please see the CT scan findings. Left lower lobe atelectasis is noted.  Mild cardiac enlargement.  IMPRESSION: Large amount of free interperitoneal air.  Original Report Authenticated By: P. Loralie Champagne, M.D.    Anti-infectives: Anti-infectives     Start     Dose/Rate Route Frequency Ordered Stop   01/12/12 2200   ertapenem (INVANZ) 1 g in sodium chloride 0.9 % 50 mL IVPB  Status:  Discontinued        1 g 100 mL/hr over 30 Minutes Intravenous Every 24 hours 01/12/12 2106 01/12/12 2115   01/12/12 1800   ciprofloxacin (CIPRO) IVPB 400 mg  Status:  Discontinued        400 mg 200 mL/hr over 60 Minutes Intravenous Every 12 hours 01/12/12 0102 01/12/12 1018   01/12/12 1400   metroNIDAZOLE (FLAGYL) IVPB 500 mg  Status:  Discontinued        500 mg 100 mL/hr over 60 Minutes Intravenous Every 8 hours 01/12/12 0102 01/12/12 1018   01/12/12 1030   ertapenem (INVANZ) 1 g in sodium chloride 0.9 % 50 mL IVPB        1 g 100 mL/hr over 30 Minutes Intravenous Every 24 hours 01/12/12 1018     01/12/12 0115   ciprofloxacin (CIPRO) IVPB 400 mg        400 mg 200 mL/hr over 60 Minutes Intravenous  Once 01/12/12 0105 01/12/12 0613   01/12/12 0115   metroNIDAZOLE (FLAGYL) IVPB 500 mg        500 mg 100 mL/hr over 60  Minutes Intravenous  Once 01/12/12 0105 01/12/12 0444          Assessment Principal Problem:  *Diverticulitis of large intestine with perforation s/p sigmoid colectomy and colostomy 01/12/12 Active Problems:  SIRS (systemic inflammatory response syndrome)-still present.    LOS: 2 days   Plan: May start IV Heparin this evening. Leave ng in until colostomy starts functioning.   [name] J 01/13/2012

## 2012-01-13 NOTE — Progress Notes (Signed)
Patient ID: [name], male   DOB: 1941-11-04, 71 y.o.   MRN: [id number]    SUBJECTIVE: S/p sigmoid colectomy with colostomy.  Intubated.  Febrile last night.  No pressors.  He is now on heparin gtt.        Marland Kitchen antiseptic oral rinse  1 application Mouth Rinse QID  . chlorhexidine  15 mL Mouth/Throat BID  . digoxin  0.125 mg Intravenous Daily  . ertapenem  1 g Intravenous Q24H  . HYDROmorphone      . insulin aspart  0-20 Units Subcutaneous Q4H  . ipratropium  0.5 mg Nebulization Q6H  . latanoprost  1 drop Both Eyes QHS  . levalbuterol  0.63 mg Nebulization Q6H  . metoprolol  5 mg Intravenous Q4H  . pantoprazole (PROTONIX) IV  40 mg Intravenous Q12H  . phytonadione (VITAMIN K) IV  2 mg Intravenous Once  . potassium chloride  10 mEq Intravenous Q1 Hr x 4  . sodium chloride  500 mL Intravenous Once  . DISCONTD: albuterol-ipratropium  6 puff Inhalation Q4H  . DISCONTD: carvedilol  12.5 mg Oral BID WC  . DISCONTD: ciprofloxacin  400 mg Intravenous Q12H  . DISCONTD: digoxin  0.125 mg Oral Daily  . DISCONTD: ertapenem (INVANZ) IV  1 g Intravenous Q24H  . DISCONTD: furosemide  20 mg Intravenous Daily  . DISCONTD: guaiFENesin  1,200 mg Oral QHS  . DISCONTD: insulin aspart  0-9 Units Subcutaneous Q4H  . DISCONTD: losartan  100 mg Oral Daily  . DISCONTD: metronidazole  500 mg Intravenous Q8H  . DISCONTD: nitrofurantoin (macrocrystal-monohydrate)  100 mg Oral Daily  . DISCONTD: pantoprazole (PROTONIX) IV  40 mg Intravenous Q24H  . DISCONTD: pantoprazole (PROTONIX) IV  40 mg Intravenous Q24H  . DISCONTD: pantoprazole (PROTONIX) IV  40 mg Intravenous Q24H  . DISCONTD: simvastatin  40 mg Oral QHS  . DISCONTD: Tamsulosin HCl  0.4 mg Oral Daily  heparin gtt    Filed Vitals:   01/13/12 0748 01/13/12 0754 01/13/12 0800 01/13/12 0831  BP: 113/94  98/74 111/48  Pulse: 95  119 123  Temp:      TempSrc:      Resp: 29  31 25   Height:      Weight:      SpO2: 96% 96% 97% 98%    Intake/Output  Summary (Last 24 hours) at 01/13/12 0939 Last data filed at 01/13/12 0800  Gross per 24 hour  Intake 3844.12 ml  Output   2645 ml  Net 1199.12 ml    LABS: Basic Metabolic Panel:  Basename 01/13/12 0420 01/12/12 0545  NA 140 138  K 3.4* 3.7  CL 100 103  CO2 32 27  GLUCOSE 122* 155*  BUN 27* 29*  CREATININE 1.06 0.78  CALCIUM 8.2* 8.2*  MG 2.0 --  PHOS 3.8 --   Liver Function Tests:  Basename 01/11/12 2340  AST 9  ALT 11  ALKPHOS 42  BILITOT 0.5  PROT 5.5*  ALBUMIN 2.4*   No results found for this basename: LIPASE:2,AMYLASE:2 in the last 72 hours CBC:  Basename 01/13/12 0420 01/12/12 0545 01/11/12 2340  WBC 9.6 8.6 --  NEUTROABS -- -- 7.9*  HGB 11.1* 11.4* --  HCT 33.1* 34.2* --  MCV 89.7 90.5 --  PLT 122* 127* --   Cardiac Enzymes: No results found for this basename: CKTOTAL:3,CKMB:3,CKMBINDEX:3,TROPONINI:3 in the last 72 hours BNP: No components found with this basename: POCBNP:3 D-Dimer: No results found for this basename: DDIMER:2 in the last  72 hours Hemoglobin A1C:  Basename 01/11/12 2340  HGBA1C 8.0*   Fasting Lipid Panel: No results found for this basename: CHOL,HDL,LDLCALC,TRIG,CHOLHDL,LDLDIRECT in the last 72 hours Thyroid Function Tests: No results found for this basename: TSH,T4TOTAL,FREET3,T3FREE,THYROIDAB in the last 72 hours Anemia Panel: No results found for this basename: VITAMINB12,FOLATE,FERRITIN,TIBC,IRON,RETICCTPCT in the last 72 hours   PHYSICAL EXAM General: NAD Neck: JVP 8-9 cm, no thyromegaly or thyroid nodule.  Lungs: Clear to auscultation bilaterally with normal respiratory effort. CV: Nondisplaced PMI.  Heart tachy,  irregular S1/S2, mechanical S1, no S3/S4, 1/6 SEM along sternal border.  No peripheral edema.  Abdomen: Colostomy, mildly tender.  Neurologic: Awake and follows commands Psych: Normal affect. Extremities: No clubbing or cyanosis.   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  1. Diverticulitis with perforation: Still febrile at times with tachycardia, SIRS present.  2. Mechanical mitral valve: heparin gtt started.  Will need to resume coumadin eventually.  3. CHF: Chronic systolic CHF.  CVP 8-9 currently.  He is volume replete, would avoid additional IV fluid now.   4. Atrial fibrillation: RVR with HR in 110s.  Tachycardia driven by SIRS, fever.  Would accept HR in 110s for now.  On IV digoxin and metoprolol.  If BP drops too low with metoprolol boluses amiodarone gtt for rate control would be an option.    [name] 01/13/2012 9:47 AM

## 2012-01-13 NOTE — Consult Note (Signed)
Name: [name] MRN: [id number] DOB: 1941/01/05    LOS: 2  PCCM PROGRESS  NOTE  History of Present Illness:  Admitted to [facility] with COPD exacerbation on 1/14.  Developed abdominal pain.  CT scan on 1/18 demonstrated diverticulitis and free air.  Started on antibiotics.  Transferred to [facility] on 1/20.  Repeat  CT scan demonstrated pneumoperitoneum and developing abscess.  Brought to OR on 1/21 for exploratory laparotomy, drainage of intraabdominal abscess, sigmoid colectomy and colostomy.  Brought to ICU intubated and mechanically ventilated.   Lines / Drains: 1/21  ETT>> 1/21  NGT>> 1/21  Foley>> 1/21  R IJ TLC>> 1/21  JP drain>>   Cultures: None  Antibiotics: 1/18  Cipro>>> 1/21 1/18  Flagyl (abd abscess)>> 1/21  Ertapenem (abd abscess)>>>  Tests / Events: 1/20  Abdominal CT>>>pneumoperitoneum / intraabdominal abscess 1/21  Exploratory laparotomy, drainage of intraabdominal abscess, sigmoid colectomy and colostomy  SUBJ: Pt remains on vent, oliguric, SIRS physiology noted  Past Medical History  Diagnosis Date  . Atrial fibrillation   . Hypertension   . Hyperkalemia   . Acute systolic heart failure   . Coronary artery disease     Cath 2005- patent LIMA-LAD, SVG-OM grafts, moderate MR  . Prostate cancer    Past Surgical History  Procedure Date  . Bypass graft 1989    LIMA-LAD, SVG-OM  . Mitral valve replacement 2007    Mechanical, performed at [facility]  . Hernia repair   . External ear surgery     drum   Prior to Admission medications   Medication Sig Start Date End Date Taking? Authorizing Provider  albuterol-ipratropium (COMBIVENT) 18-103 MCG/ACT inhaler Inhale 2 puffs into the lungs as needed. For shortness of breath   Yes Historical Provider, MD  aspirin EC 81 MG tablet Take 81 mg by mouth daily.   Yes Historical Provider, MD  budesonide-formoterol (SYMBICORT) 160-4.5 MCG/ACT inhaler Inhale 2 puffs into the lungs 2 (two) times daily.   Yes Historical  Provider, MD  carvedilol (COREG) 12.5 MG tablet Take 12.5 mg by mouth 2 (two) times daily with a meal.  07/31/11  Yes Historical Provider, MD  Cholecalciferol (VITAMIN D3) 1000 UNITS CAPS Take 1 capsule by mouth daily.     Yes Historical Provider, MD  digoxin (LANOXIN) 0.125 MG tablet Take 125 mcg by mouth daily.   Yes Historical Provider, MD  furosemide (LASIX) 20 MG tablet Take 20 mg by mouth daily.   Yes Historical Provider, MD  guaiFENesin (MUCINEX) 600 MG 12 hr tablet Take 1,200 mg by mouth at bedtime.   Yes Historical Provider, MD  indomethacin (INDOCIN) 50 MG capsule Take 50 mg by mouth 3 (three) times daily as needed.   Yes Historical Provider, MD  ipratropium-albuterol (DUONEB) 0.5-2.5 (3) MG/3ML SOLN Take 3 mLs by nebulization as needed. For shortness of breath   Yes Historical Provider, MD  losartan (COZAAR) 100 MG tablet Take 100 mg by mouth daily.  07/30/11  Yes Historical Provider, MD  metFORMIN (GLUCOPHAGE) 500 MG tablet Take 500 mg by mouth 2 (two) times daily with a meal.  08/26/11  Yes Historical Provider, MD  nitrofurantoin (MACRODANTIN) 100 MG capsule Take 100 mg by mouth daily.   Yes Historical Provider, MD  OVER THE COUNTER MEDICATION Take 1 tablet by mouth daily. Preservision Eye Vitamins   Yes Historical Provider, MD  simvastatin (ZOCOR) 40 MG tablet Take 40 mg by mouth at bedtime.     Yes Historical Provider, MD  Tamsulosin  HCl (FLOMAX) 0.4 MG CAPS Take 0.4 mg by mouth daily.  07/30/11  Yes Historical Provider, MD  warfarin (COUMADIN) 2.5 MG tablet Take 2.5 mg by mouth daily. Take 2.5 mg on Mon, Tues, Thurs,Fri, and Sun. Take 5 mg on Wed and Saturday.   Yes Historical Provider, MD   Allergies Allergies  Allergen Reactions  . Codeine     REACTION: hives  . Sulfonamide Derivatives     REACTION: hives  . Tylenol-Codeine     REACTION: hives   Family History Family History  Problem Relation Age of Onset  . Coronary artery disease Mother   . Stroke Father   . Heart attack  Mother     x10  . Heart attack Brother     with bypass   . Heart attack Sister     with bypass   . Cancer Sister   . Cancer Brother    Social History  reports that he quit smoking about 24 years ago. His smoking use included Cigarettes. He has a 92 pack-year smoking history. He has never used smokeless tobacco. He reports that he does not drink alcohol or use illicit drugs.  Review Of Systems  11 points review of systems is negative with an exception of listed in HPI.  Vital Signs: Temp:  [97.4 F (36.3 C)-102 F (38.9 C)] 99.4 F (37.4 C) (01/22 0705) Pulse Rate:  [69-121] 119  (01/22 0800) Resp:  [14-31] 31  (01/22 0800) BP: (90-155)/(40-94) 98/74 mmHg (01/22 0800) SpO2:  [95 %-100 %] 97 % (01/22 0800) FiO2 (%):  [2 %-40 %] 40 % (01/22 0800) Weight:  [77.3 kg (170 lb 6.7 oz)-77.7 kg (171 lb 4.8 oz)] 77.3 kg (170 lb 6.7 oz) (01/22 0400) I/O last 3 completed shifts: In: 4438.6 [P.O.:240; I.V.:2027.6; Blood:1609; NG/GT:50; IV Piggyback:512] Out: 3230 [Urine:2735; Drains:295; Blood:200]  Physical Examination: General:  Mechanically ventilated, not in distress, awake, ok on SBT but SIRS physiology still active Neuro:  Sedated, nonfocal, synchronous with ventilator HEENT:  NGT, ETT Neck:  No JVD   Cardiovascular: Irregularly irregular rate, no murmurs Lungs:  Bilateral diminished air entry, no wheezing Abdomen:  Viable colostomy, surgically dressing clean / dry / intact Musculoskeletal: No edema Skin:  No rash  Ventilator settings: Vent Mode:  [-] CPAP FiO2 (%):  [2 %-40 %] 40 % Set Rate:  [14 bmp] 14 bmp Vt Set:  [500 mL] 500 mL PEEP:  [5 cmH20] 5 cmH20 Pressure Support:  [5 cmH20] 5 cmH20 Plateau Pressure:  [17 cmH20-20 cmH20] 18 cmH20  Labs and Imaging:  Reviewed.  Please refer to the Assessment and Plan section for relevant results. Ct Abdomen Pelvis W Contrast  01/12/2012  **ADDENDUM** CREATED: 01/12/2012 10:15:59  Findings called directly to Dr. Gwenlyn Perking at 10:00am.   **END ADDENDUM** SIGNED BY: Cyndie Chime, M.D.    01/12/2012  *RADIOLOGY REPORT*  Clinical Data: Left lower quadrant abdominal pain.  History of diverticulitis.  CT ABDOMEN AND PELVIS WITH CONTRAST  Technique:  Multidetector CT imaging of the abdomen and pelvis was performed following the standard protocol during bolus administration of intravenous contrast.  Contrast: OMNIPAQUE IOHEXOL 300 MG/ML IV SOLN  Comparison: CT scan 02/01/2009.  Findings: The lung bases demonstrate left basilar atelectasis and cardiac enlargement.  There is a large amount of free interperitoneal air along with a left lower quadrant/upper left pelvic 7 x 7 cm abscess.  Findings are most likely due to ruptured diverticulitis.  The solid abdominal organs are unremarkable except for  stable bilateral adrenal gland adenomas and bilateral renal cysts.  The stomach, duodenum and small bowel are unremarkable.  The appendix is normal.  The aorta is normal in caliber.  Moderate atherosclerotic changes. The major branch vessels are patent.  Moderate atherosclerotic calcifications involving the branch vessels.  No mesenteric or retroperitoneal masses or adenopathy.  The bladder appears normal.  Brachytherapy prostate seeds are noted.  The bony structures are unremarkable.  IMPRESSION:  1.  Large amount of free intraperitoneal air and large left lower quadrant/upper left pelvic abscess likely due to perforated diverticulitis. 2.  Left lower lobe atelectasis. 3.  Stable adrenal gland adenomas and bilateral renal cysts.  Original Report Authenticated By: P. Loralie Champagne, M.D.   Dg Chest Port 1 View  01/12/2012  *RADIOLOGY REPORT*  Clinical Data: Status post bowel resection.  PORTABLE CHEST - 1 VIEW  Comparison: PA and lateral chest 03/18/2006.  Findings: The patient has a right IJ catheter with the tip in the right atrium.  The line could be withdrawn 3.5 cm for better positioning.  Endotracheal tube is in place the tip in good position at  the level of the clavicular heads.  NG tube courses into the stomach and below the inferior margin of the film.  The patient has basilar atelectatic change, greater on the left. No pneumothorax.  There is cardiomegaly.  IMPRESSION:  1.  Right IJ catheter tip is in the right antrum.  The line could be withdrawn 3.5 cm for better positioning. 2.  ET tube in good position. 3.  Left worse than right basilar atelectasis.  Original Report Authenticated By: Bernadene Bell. D'ALESSIO, M.D.   Dg Abd Acute W/chest  01/12/2012  *RADIOLOGY REPORT*  Clinical Data: Ruptured sigmoid diverticulitis.  ACUTE ABDOMEN SERIES (ABDOMEN 2 VIEW & CHEST 1 VIEW)  Comparison: CT scan, same date.  Findings: There is a large amount of free intraperitoneal air. Please see the CT scan findings. Left lower lobe atelectasis is noted.  Mild cardiac enlargement.  IMPRESSION: Large amount of free interperitoneal air.  Original Report Authenticated By: P. Loralie Champagne, M.D.     Assessment and Plan:  Intraabdominal abscess, s/p eploratory laparotomy, drainage of intraabdominal abscess, sigmoid colectomy and colostomy.  No signs of sepsis.  Lab 01/13/12 0420 01/12/12 0545 01/11/12 2340  WBC 9.6 8.6 8.7   -->  Antibiotics as above -->  Surgery following --->not ready to extubate d/t SIRS physiology  Encephalopathy secondary to general anesthesia / sedation. Now improved.   Good pain control.  Synchronous with ventilator. -->  Propofol for RASS 0 to -1 -->  Fentanyl boluses PRN pain -->  Daily WUA>>did will on WUA however, not ready to extubate  Post-operative respiratory failure.  History of COPD.  No evidence of acute bronchospasm.  Lab 01/12/12 2143  PHART 7.422  PCO2ART 46.1*  PO2ART 117.0*   -->  Full mechanical support for now.  SBTs daily with WUA -->  Bronchodilators>>>change to xopenex/atrovent BD neb meds -->  Avoid steroids as recent surgery and no active bronchospasm  History of CAD s/p CABG.  History of MR with  mechanical valve replacement.  Ischemic cardiomyopathy.  Atrial fibrillation.  No signs of active ischemia.  Atrial fibrillation with controlled rate.  Dyslipidemia. -->  Hold ASA, Coreg, Lqsix, Losartan, Simvastatin -->  Heparin per pharmacy 12 hours after surgery -->  Digoxin / Metoprolol IV -->  IVF to [facility] -->  CVP q4H  Hematemesis is on the record but no active hematemesis noted and  no bloody discharge from NG.  This is on the background of Coumadin, ASA and Indocin.  Stable hemoglobin.  Lab 01/13/12 0420 01/12/12 0545 01/11/12 2340  HCT 33.1* 34.2* 34.8*   -->  Protonix q12h IV  Coumadin induced coagulopathy, s/p FFP / Vit K  Lab 01/13/12 0420 01/12/12 1542 01/12/12 0545 01/11/12 2340  INR 1.36 1.52* 2.53* 2.65*   -->  Heparin per pharmacy as above  Diabetes / hyperglycemia  Lab 01/13/12 0709 01/13/12 0412 01/13/12 0056 01/12/12 2136 01/12/12 1638  GLUCAP 119* 118* 126* 154* 155*   -->  Hold Metformin -->  SSI HG protocol  Best practices / Disposition -->  ICU status under PCCM -->  Surgery following -->  Full code -->  SCDs for DVT Px -->  Protonix for GI Px -->  Ventilator bundle -->  NPO -->  Family updated at bedside this am, daughter and spouse  The patient is critically ill with multiple organ systems failure and requires high complexity decision making for assessment and support, frequent evaluation and titration of therapies, application of advanced monitoring technologies and extensive interpretation of multiple databases. Critical Care Time devoted to patient care services described in this note is 40 minutes.  Shan Levans Beeper  [phone]  Cell  [phone]  If no response or cell goes to voicemail, call beeper [phone]  01/13/2012, 8:44 AM

## 2012-01-13 NOTE — Progress Notes (Signed)
ANTICOAGULATION CONSULT NOTE - Initial Consult  Pharmacy Consult for heparin Indication: atrial fibrillation  Allergies  Allergen Reactions  . Codeine     REACTION: hives  . Sulfonamide Derivatives     REACTION: hives  . Tylenol-Codeine     REACTION: hives    Patient Measurements: Height: 5' 4.8" (164.6 cm) Weight: 171 lb 4.8 oz (77.7 kg) IBW/kg (Calculated) : 61.04   Vital Signs: Temp: 98.6 F (37 C) (01/21 2045) Temp src: Axillary (01/21 2045) BP: 110/49 mmHg (01/22 0000) Pulse Rate: 90  (01/22 0000)  Labs:  Basename 01/12/12 1542 01/12/12 0545 01/11/12 2340  HGB -- 11.4* 11.7*  HCT -- 34.2* 34.8*  PLT -- 127* 121*  APTT -- -- --  LABPROT 18.6* 27.7* 28.7*  INR 1.52* 2.53* 2.65*  HEPARINUNFRC -- -- --  CREATININE -- 0.78 0.85  CKTOTAL -- -- --  CKMB -- -- --  TROPONINI -- -- --   Estimated Creatinine Clearance: 79.9 ml/min (by C-G formula based on Cr of 0.78).  Medical History: Past Medical History  Diagnosis Date  . Atrial fibrillation   . Hypertension   . Hyperkalemia   . Acute systolic heart failure   . Coronary artery disease     Cath 2005- patent LIMA-LAD, SVG-OM grafts, moderate MR  . Prostate cancer     Medications:  Prescriptions prior to admission  Medication Sig Dispense Refill  . albuterol-ipratropium (COMBIVENT) 18-103 MCG/ACT inhaler Inhale 2 puffs into the lungs as needed. For shortness of breath      . aspirin EC 81 MG tablet Take 81 mg by mouth daily.      . budesonide-formoterol (SYMBICORT) 160-4.5 MCG/ACT inhaler Inhale 2 puffs into the lungs 2 (two) times daily.      . carvedilol (COREG) 12.5 MG tablet Take 12.5 mg by mouth 2 (two) times daily with a meal.       . Cholecalciferol (VITAMIN D3) 1000 UNITS CAPS Take 1 capsule by mouth daily.        . digoxin (LANOXIN) 0.125 MG tablet Take 125 mcg by mouth daily.      . furosemide (LASIX) 20 MG tablet Take 20 mg by mouth daily.      Marland Kitchen guaiFENesin (MUCINEX) 600 MG 12 hr tablet Take  1,200 mg by mouth at bedtime.      . indomethacin (INDOCIN) 50 MG capsule Take 50 mg by mouth 3 (three) times daily as needed.      Marland Kitchen ipratropium-albuterol (DUONEB) 0.5-2.5 (3) MG/3ML SOLN Take 3 mLs by nebulization as needed. For shortness of breath      . losartan (COZAAR) 100 MG tablet Take 100 mg by mouth daily.       . metFORMIN (GLUCOPHAGE) 500 MG tablet Take 500 mg by mouth 2 (two) times daily with a meal.       . nitrofurantoin (MACRODANTIN) 100 MG capsule Take 100 mg by mouth daily.      Marland Kitchen OVER THE COUNTER MEDICATION Take 1 tablet by mouth daily. Preservision Eye Vitamins      . simvastatin (ZOCOR) 40 MG tablet Take 40 mg by mouth at bedtime.        . Tamsulosin HCl (FLOMAX) 0.4 MG CAPS Take 0.4 mg by mouth daily.       Marland Kitchen warfarin (COUMADIN) 2.5 MG tablet Take 2.5 mg by mouth daily. Take 2.5 mg on Mon, Tues, Thurs,Fri, and Sun. Take 5 mg on Wed and Saturday.       Scheduled:    .  albuterol-ipratropium  6 puff Inhalation Q4H  . antiseptic oral rinse  1 application Mouth Rinse QID  . chlorhexidine  15 mL Mouth/Throat BID  . ciprofloxacin  400 mg Intravenous Once  . digoxin  0.125 mg Intravenous Daily  . ertapenem  1 g Intravenous Q24H  . HYDROmorphone      . insulin aspart  0-20 Units Subcutaneous Q4H  . latanoprost  1 drop Both Eyes QHS  . metoprolol  5 mg Intravenous Q4H  . metronidazole  500 mg Intravenous Once  . pantoprazole (PROTONIX) IV  40 mg Intravenous Q12H  . phytonadione (VITAMIN K) IV  2 mg Intravenous Once  . DISCONTD: albuterol  2.5 mg Nebulization Q6H  . DISCONTD: carvedilol  12.5 mg Oral BID WC  . DISCONTD: ciprofloxacin  400 mg Intravenous Q12H  . DISCONTD: digoxin  0.125 mg Oral Daily  . DISCONTD: ertapenem (INVANZ) IV  1 g Intravenous Q24H  . DISCONTD: furosemide  20 mg Intravenous Daily  . DISCONTD: guaiFENesin  1,200 mg Oral QHS  . DISCONTD: insulin aspart  0-9 Units Subcutaneous Q4H  . DISCONTD: ipratropium  0.5 mg Nebulization Q6H  . DISCONTD:  losartan  100 mg Oral Daily  . DISCONTD: metronidazole  500 mg Intravenous Q8H  . DISCONTD: nitrofurantoin (macrocrystal-monohydrate)  100 mg Oral Daily  . DISCONTD: pantoprazole (PROTONIX) IV  40 mg Intravenous Q24H  . DISCONTD: pantoprazole (PROTONIX) IV  40 mg Intravenous Q24H  . DISCONTD: pantoprazole (PROTONIX) IV  40 mg Intravenous Q24H  . DISCONTD: simvastatin  40 mg Oral QHS  . DISCONTD: Tamsulosin HCl  0.4 mg Oral Daily   Infusions:    . heparin    . lactated ringers 20 mL/hr at 01/12/12 2215  . propofol 15 mcg/kg/min (01/12/12 2300)  . DISCONTD: sodium chloride 50 mL/hr at 01/11/12 2347  . DISCONTD: dextrose 5 % with KCl 20 mEq / L    . DISCONTD: pantoprozole (PROTONIX) infusion 8 mg/hr (01/12/12 1242)    Assessment: 71yo male now post-op for ex-lap, drainage of intraabdominal abscess, sigmoid colectomy, and colostomy, given vitamin K prior to surgery to lower INR, to begin heparin for Afib 12 hours after end of surgery.  Goal of Therapy:  Heparin level 0.3-0.7 units/ml   Plan:  Will begin heparin gtt at 1100 units/hr at 0800 and monitor heparin levels and CBC.  Colleen Can PharmD BCPS 01/13/2012,12:40 AM

## 2012-01-13 NOTE — Progress Notes (Signed)
ANTICOAGULATION CONSULT NOTE - Follow Up Consult  Pharmacy Consult for Heparin Indication: atrial fibrillation and MVR  Allergies  Allergen Reactions  . Codeine     REACTION: hives  . Sulfonamide Derivatives     REACTION: hives  . Tylenol-Codeine     REACTION: hives    Patient Measurements: Height: 5' 4.8" (164.6 cm) Weight: 170 lb 6.7 oz (77.3 kg) IBW/kg (Calculated) : 61.04   Vital Signs: Temp: 98.3 F (36.8 C) (01/22 1133) Temp src: Axillary (01/22 1133) BP: 86/59 mmHg (01/22 1300) Pulse Rate: 116  (01/22 1300)  Labs:  Basename 01/13/12 1400 01/13/12 0420 01/12/12 1542 01/12/12 0545 01/11/12 2340  HGB -- 11.1* -- 11.4* --  HCT -- 33.1* -- 34.2* 34.8*  PLT -- 122* -- 127* 121*  APTT -- -- -- -- --  LABPROT -- 17.0* 18.6* 27.7* --  INR -- 1.36 1.52* 2.53* --  HEPARINUNFRC <0.10* -- -- -- --  CREATININE -- 1.06 -- 0.78 0.85  CKTOTAL -- -- -- -- --  CKMB -- -- -- -- --  TROPONINI -- -- -- -- --   Estimated Creatinine Clearance: 60.1 ml/min (by C-G formula based on Cr of 1.06).   Medications:  Scheduled:    . antiseptic oral rinse  1 application Mouth Rinse QID  . chlorhexidine  15 mL Mouth/Throat BID  . digoxin  0.125 mg Intravenous Daily  . ertapenem  1 g Intravenous Q24H  . HYDROmorphone      . insulin aspart  0-20 Units Subcutaneous Q4H  . ipratropium  0.5 mg Nebulization Q6H  . latanoprost  1 drop Both Eyes QHS  . levalbuterol  0.63 mg Nebulization Q6H  . metoprolol  5 mg Intravenous Q4H  . pantoprazole (PROTONIX) IV  40 mg Intravenous Q12H  . potassium chloride  10 mEq Intravenous Q1 Hr x 4  . sodium chloride  500 mL Intravenous Once  . DISCONTD: albuterol-ipratropium  6 puff Inhalation Q4H  . DISCONTD: ertapenem (INVANZ) IV  1 g Intravenous Q24H  . DISCONTD: furosemide  20 mg Intravenous Daily  . DISCONTD: insulin aspart  0-9 Units Subcutaneous Q4H  . DISCONTD: nitrofurantoin (macrocrystal-monohydrate)  100 mg Oral Daily  . DISCONTD: pantoprazole  (PROTONIX) IV  40 mg Intravenous Q24H  . DISCONTD: pantoprazole (PROTONIX) IV  40 mg Intravenous Q24H  . DISCONTD: Tamsulosin HCl  0.4 mg Oral Daily   Infusions:    . heparin 1,100 Units/hr (01/13/12 0849)  . lactated ringers 20 mL/hr at 01/12/12 2215  . propofol 5 mcg/kg/min (01/13/12 1300)  . DISCONTD: sodium chloride 50 mL/hr at 01/11/12 2347  . DISCONTD: dextrose 5 % with KCl 20 mEq / L    . DISCONTD: pantoprozole (PROTONIX) infusion 8 mg/hr (01/12/12 1242)    Assessment: 71 y/o male patient s/p colectomy, on heparin gtt for h/o afib and MVR. INR reversed with Vitamin K and now subtherapeutic. First heparin level is subtherapeutic, will increase rate, no bleeding reported.  Goal of Therapy:  Heparin level 0.3-0.7 units/ml   Plan:  Increase heparin to 1400 units/hr and check 8 hour heparin level.  Verlene Mayer, PharmD, BCPS Pager [phone] 01/13/2012,2:56 PM

## 2012-01-13 NOTE — Consult Note (Signed)
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  Red and viable, slightly above skin level.  Output  No stool or flatus, small bloody drainage. Ostomy pouching: 1pc  Education provided:  Pt intubated and unable to ask questions at this time.  Wife and son at bedside.  Briefly discussed pouching routines.  Will demonstrate pouch change when stable and out of ICU.  Educational materials left at bedside and supplies ordered to room for staff nurse use.  Cammie Mcgee, RN, MSN, [employer]  [phone]

## 2012-01-14 ENCOUNTER — Encounter: Payer: Medicare Other | Admitting: Emergency Medicine

## 2012-01-14 ENCOUNTER — Inpatient Hospital Stay (HOSPITAL_COMMUNITY): Payer: Medicare Other

## 2012-01-14 DIAGNOSIS — G9340 Encephalopathy, unspecified: Secondary | ICD-10-CM | POA: Diagnosis not present

## 2012-01-14 DIAGNOSIS — J9819 Other pulmonary collapse: Secondary | ICD-10-CM | POA: Diagnosis not present

## 2012-01-14 DIAGNOSIS — J9600 Acute respiratory failure, unspecified whether with hypoxia or hypercapnia: Secondary | ICD-10-CM | POA: Diagnosis not present

## 2012-01-14 DIAGNOSIS — K651 Peritoneal abscess: Secondary | ICD-10-CM | POA: Diagnosis not present

## 2012-01-14 LAB — CBC
Hemoglobin: 10.7 g/dL — ABNORMAL LOW (ref 13.0–17.0)
MCH: 30.1 pg (ref 26.0–34.0)
MCHC: 32.4 g/dL (ref 30.0–36.0)
Platelets: 129 10*3/uL — ABNORMAL LOW (ref 150–400)
RDW: 15.9 % — ABNORMAL HIGH (ref 11.5–15.5)

## 2012-01-14 LAB — GLUCOSE, CAPILLARY
Glucose-Capillary: 113 mg/dL — ABNORMAL HIGH (ref 70–99)
Glucose-Capillary: 121 mg/dL — ABNORMAL HIGH (ref 70–99)
Glucose-Capillary: 124 mg/dL — ABNORMAL HIGH (ref 70–99)
Glucose-Capillary: 129 mg/dL — ABNORMAL HIGH (ref 70–99)

## 2012-01-14 LAB — BASIC METABOLIC PANEL
Calcium: 8.1 mg/dL — ABNORMAL LOW (ref 8.4–10.5)
GFR calc Af Amer: >90 mL/min (ref >90)
GFR calc non Af Amer: 82 mL/min — ABNORMAL LOW (ref >90)
Glucose, Bld: 130 mg/dL — ABNORMAL HIGH (ref 70–99)
Potassium: 3.6 mEq/L (ref 3.5–5.1)
Sodium: 140 mEq/L (ref 135–145)

## 2012-01-14 MED ORDER — ACYCLOVIR 5 % EX OINT
TOPICAL_OINTMENT | CUTANEOUS | Status: DC
  Administered 2012-01-14 – 2012-01-15 (×10): via TOPICAL
  Administered 2012-01-15: 1 via TOPICAL
  Administered 2012-01-15 – 2012-01-17 (×12): via TOPICAL
  Administered 2012-01-17: 1 via TOPICAL
  Administered 2012-01-17: 17:00:00 via TOPICAL
  Administered 2012-01-17: 1 via TOPICAL
  Administered 2012-01-18 (×4): via TOPICAL
  Administered 2012-01-18: 1 via TOPICAL
  Administered 2012-01-18 (×4): via TOPICAL
  Administered 2012-01-19: 1 via TOPICAL
  Administered 2012-01-19 (×2): via TOPICAL
  Administered 2012-01-19: 1 via TOPICAL
  Administered 2012-01-19 (×2): via TOPICAL
  Administered 2012-01-19: 1 via TOPICAL
  Administered 2012-01-20 – 2012-01-22 (×20): via TOPICAL
  Administered 2012-01-22: 1 via TOPICAL
  Administered 2012-01-22: 18:00:00 via TOPICAL
  Administered 2012-01-23: 1 via TOPICAL
  Administered 2012-01-23 (×2): via TOPICAL
  Administered 2012-01-23: 1 via TOPICAL
  Administered 2012-01-23 (×2): via TOPICAL
  Administered 2012-01-23: 1 via TOPICAL
  Administered 2012-01-24 – 2012-01-25 (×8): via TOPICAL
  Filled 2012-01-14 (×3): qty 30

## 2012-01-14 MED ORDER — BLISTEX EX OINT
TOPICAL_OINTMENT | CUTANEOUS | Status: DC | PRN
  Filled 2012-01-14: qty 10

## 2012-01-14 NOTE — Progress Notes (Signed)
ANTICOAGULATION CONSULT NOTE - Follow Up Consult  Pharmacy Consult for Heparin Indication: atrial fibrillation and MVR  Allergies  Allergen Reactions  . Codeine     REACTION: hives  . Sulfonamide Derivatives     REACTION: hives  . Tylenol-Codeine     REACTION: hives    Patient Measurements: Height: 5' 4.8" (164.6 cm) Weight: 173 lb 11.6 oz (78.8 kg) IBW/kg (Calculated) : 61.04   Vital Signs: Temp: 98.6 F (37 C) (01/23 0704) Temp src: Axillary (01/23 0704) BP: 127/56 mmHg (01/23 0800) Pulse Rate: 95  (01/23 0800)  Labs:  Basename 01/14/12 0650 01/14/12 0400 01/13/12 2300 01/13/12 1400 01/13/12 0420 01/12/12 1542 01/12/12 0545  HGB -- 10.7* -- -- 11.1* -- --  HCT -- 33.0* -- -- 33.1* -- 34.2*  PLT -- 129* -- -- 122* -- 127*  APTT -- -- -- -- -- -- --  LABPROT -- -- -- -- 17.0* 18.6* 27.7*  INR -- -- -- -- 1.36 1.52* 2.53*  HEPARINUNFRC 0.36 -- 0.23* <0.10* -- -- --  CREATININE -- 0.94 -- -- 1.06 -- 0.78  CKTOTAL -- -- -- -- -- -- --  CKMB -- -- -- -- -- -- --  TROPONINI -- -- -- -- -- -- --   Estimated Creatinine Clearance: 68.4 ml/min (by C-G formula based on Cr of 0.94).   Medications:  Scheduled:    . antiseptic oral rinse  1 application Mouth Rinse QID  . chlorhexidine  15 mL Mouth/Throat BID  . digoxin  0.125 mg Intravenous Daily  . ertapenem  1 g Intravenous Q24H  . insulin aspart  0-20 Units Subcutaneous Q4H  . ipratropium  0.5 mg Nebulization Q6H  . latanoprost  1 drop Both Eyes QHS  . levalbuterol  0.63 mg Nebulization Q6H  . metoprolol  5 mg Intravenous Q4H  . pantoprazole (PROTONIX) IV  40 mg Intravenous Q12H  . potassium chloride  10 mEq Intravenous Q1 Hr x 4  . sodium chloride  500 mL Intravenous Once   Infusions:    . heparin 1,600 Units/hr (01/14/12 0316)  . lactated ringers 20 mL/hr at 01/14/12 0454  . DISCONTD: propofol Stopped (01/14/12 0730)    Assessment: 71 y/o male patient receiving a heparin gtt for bridge therapy,  subtherapeutic INR for h/o afib and MVR. Coumadin not resumed yet, no bleeding reported. Heparin level therapeutic.  Goal of Therapy:  Heparin level 0.3-0.7 units/ml   Plan:  Continue heparin gtt at 1600 units/hr and f/u in am.  Verlene Mayer, PharmD, BCPS Pager [phone] 01/14/2012,8:56 AM

## 2012-01-14 NOTE — Progress Notes (Signed)
30ml IV Diprivan wasted in sink. Witnessed by Edilia Bo, RN.

## 2012-01-14 NOTE — Progress Notes (Signed)
Name: EIN RIJO MRN: [id number] DOB: 1941/10/01    LOS: 3  PCCM PROGRESS  NOTE  History of Present Illness:  Admitted to [facility] with COPD exacerbation on 1/14.  Developed abdominal pain.  CT scan on 1/18 demonstrated diverticulitis and free air.  Started on antibiotics.  Transferred to [facility] on 1/20.  Repeat  CT scan demonstrated pneumoperitoneum and developing abscess.  Brought to OR on 1/21 for exploratory laparotomy, drainage of intraabdominal abscess, sigmoid colectomy and colostomy.  Brought to ICU intubated and mechanically ventilated.   Lines / Drains: 1/21  ETT>> 1/21  NGT>> 1/21  Foley>> 1/21  R IJ TLC>> 1/21  JP drain>>   Cultures: None  Antibiotics: 1/18  Cipro>>> 1/21 1/18  Flagyl (abd abscess)>>1/21 1/21  Ertapenem (abd abscess)>>>  Tests / Events: 1/20  Abdominal CT>>>pneumoperitoneum / intraabdominal abscess 1/21  Exploratory laparotomy, drainage of intraabdominal abscess, sigmoid colectomy and colostomy  SUBJ: Improved, alert, ready to extubate, VC looks good.  HR variable,   Past Medical History  Diagnosis Date  . Atrial fibrillation   . Hypertension   . Hyperkalemia   . Acute systolic heart failure   . Coronary artery disease     Cath 2005- patent LIMA-LAD, SVG-OM grafts, moderate MR  . Prostate cancer    Past Surgical History  Procedure Date  . Bypass graft 1989    LIMA-LAD, SVG-OM  . Mitral valve replacement 2007    Mechanical, performed at [facility]  . Hernia repair   . External ear surgery     drum  . Colon resection 01/12/2012    Procedure: COLON RESECTION;  Surgeon: Adolph Pollack, MD;  Location: [facility] OR;  Service: General;  Laterality: N/A;   Prior to Admission medications   Medication Sig Start Date End Date Taking? Authorizing Provider  albuterol-ipratropium (COMBIVENT) 18-103 MCG/ACT inhaler Inhale 2 puffs into the lungs as needed. For shortness of breath   Yes Historical Provider, MD  aspirin EC 81 MG tablet Take 81 mg by  mouth daily.   Yes Historical Provider, MD  budesonide-formoterol (SYMBICORT) 160-4.5 MCG/ACT inhaler Inhale 2 puffs into the lungs 2 (two) times daily.   Yes Historical Provider, MD  carvedilol (COREG) 12.5 MG tablet Take 12.5 mg by mouth 2 (two) times daily with a meal.  07/31/11  Yes Historical Provider, MD  Cholecalciferol (VITAMIN D3) 1000 UNITS CAPS Take 1 capsule by mouth daily.     Yes Historical Provider, MD  digoxin (LANOXIN) 0.125 MG tablet Take 125 mcg by mouth daily.   Yes Historical Provider, MD  furosemide (LASIX) 20 MG tablet Take 20 mg by mouth daily.   Yes Historical Provider, MD  guaiFENesin (MUCINEX) 600 MG 12 hr tablet Take 1,200 mg by mouth at bedtime.   Yes Historical Provider, MD  indomethacin (INDOCIN) 50 MG capsule Take 50 mg by mouth 3 (three) times daily as needed.   Yes Historical Provider, MD  ipratropium-albuterol (DUONEB) 0.5-2.5 (3) MG/3ML SOLN Take 3 mLs by nebulization as needed. For shortness of breath   Yes Historical Provider, MD  losartan (COZAAR) 100 MG tablet Take 100 mg by mouth daily.  07/30/11  Yes Historical Provider, MD  metFORMIN (GLUCOPHAGE) 500 MG tablet Take 500 mg by mouth 2 (two) times daily with a meal.  08/26/11  Yes Historical Provider, MD  nitrofurantoin (MACRODANTIN) 100 MG capsule Take 100 mg by mouth daily.   Yes Historical Provider, MD  OVER THE COUNTER MEDICATION Take 1 tablet by mouth daily. Preservision  Eye Vitamins   Yes Historical Provider, MD  simvastatin (ZOCOR) 40 MG tablet Take 40 mg by mouth at bedtime.     Yes Historical Provider, MD  Tamsulosin HCl (FLOMAX) 0.4 MG CAPS Take 0.4 mg by mouth daily.  07/30/11  Yes Historical Provider, MD  warfarin (COUMADIN) 2.5 MG tablet Take 2.5 mg by mouth daily. Take 2.5 mg on Mon, Tues, Thurs,Fri, and Sun. Take 5 mg on Wed and Saturday.   Yes Historical Provider, MD   Allergies Allergies  Allergen Reactions  . Codeine     REACTION: hives  . Sulfonamide Derivatives     REACTION: hives  .  Tylenol-Codeine     REACTION: hives   Family History Family History  Problem Relation Age of Onset  . Coronary artery disease Mother   . Stroke Father   . Heart attack Mother     x10  . Heart attack Brother     with bypass   . Heart attack Sister     with bypass   . Cancer Sister   . Cancer Brother    Social History  reports that he quit smoking about 24 years ago. His smoking use included Cigarettes. He has a 92 pack-year smoking history. He has never used smokeless tobacco. He reports that he does not drink alcohol or use illicit drugs.  Review Of Systems  11 points review of systems is negative with an exception of listed in HPI.  Vital Signs: Temp:  [98.3 F (36.8 C)-100 F (37.8 C)] 98.6 F (37 C) (01/23 0704) Pulse Rate:  [70-130] 95  (01/23 0800) Resp:  [8-26] 21  (01/23 0800) BP: (86-147)/(36-79) 127/56 mmHg (01/23 0800) SpO2:  [95 %-100 %] 100 % (01/23 0800) FiO2 (%):  [40 %] 40 % (01/23 0800) Weight:  [78.8 kg (173 lb 11.6 oz)] 78.8 kg (173 lb 11.6 oz) (01/23 0500) I/O last 3 completed shifts: In: 2886.7 [I.V.:1941.7; Blood:315; NG/GT:170; IV Piggyback:460] Out: 2175 [Urine:1440; Emesis/NG output:300; Drains:435]  Physical Examination: General:  Mechanically ventilated, not in distress, awake, ok on SBT , SIRS resolved Neuro: awake and alert HEENT:  NGT, ETT Neck:  No JVD   Cardiovascular: Irregularly irregular rate, no murmurs Lungs:  Bilateral diminished air entry, no wheezing Abdomen:  Viable colostomy, surgically dressing clean / dry / intact Musculoskeletal: No edema Skin:  No rash  Ventilator settings: Vent Mode:  [-] CPAP;PSV FiO2 (%):  [40 %] 40 % Set Rate:  [14 bmp] 14 bmp Vt Set:  [500 mL] 500 mL PEEP:  [5 cmH20] 5 cmH20 Pressure Support:  [5 cmH20-10 cmH20] 5 cmH20 Plateau Pressure:  [16 cmH20-17 cmH20] 16 cmH20  Labs and Imaging:  Reviewed.  Please refer to the Assessment and Plan section for relevant results. Ct Abdomen Pelvis W  Contrast  01/12/2012  **ADDENDUM** CREATED: 01/12/2012 10:15:59  Findings called directly to Dr. Gwenlyn Perking at 10:00am.  **END ADDENDUM** SIGNED BY: Cyndie Chime, M.D.    01/12/2012  *RADIOLOGY REPORT*  Clinical Data: Left lower quadrant abdominal pain.  History of diverticulitis.  CT ABDOMEN AND PELVIS WITH CONTRAST  Technique:  Multidetector CT imaging of the abdomen and pelvis was performed following the standard protocol during bolus administration of intravenous contrast.  Contrast: OMNIPAQUE IOHEXOL 300 MG/ML IV SOLN  Comparison: CT scan 02/01/2009.  Findings: The lung bases demonstrate left basilar atelectasis and cardiac enlargement.  There is a large amount of free interperitoneal air along with a left lower quadrant/upper left pelvic 7 x 7  cm abscess.  Findings are most likely due to ruptured diverticulitis.  The solid abdominal organs are unremarkable except for stable bilateral adrenal gland adenomas and bilateral renal cysts.  The stomach, duodenum and small bowel are unremarkable.  The appendix is normal.  The aorta is normal in caliber.  Moderate atherosclerotic changes. The major branch vessels are patent.  Moderate atherosclerotic calcifications involving the branch vessels.  No mesenteric or retroperitoneal masses or adenopathy.  The bladder appears normal.  Brachytherapy prostate seeds are noted.  The bony structures are unremarkable.  IMPRESSION:  1.  Large amount of free intraperitoneal air and large left lower quadrant/upper left pelvic abscess likely due to perforated diverticulitis. 2.  Left lower lobe atelectasis. 3.  Stable adrenal gland adenomas and bilateral renal cysts.  Original Report Authenticated By: P. Loralie Champagne, M.D.   Dg Chest Port 1 View  01/14/2012  *RADIOLOGY REPORT*  Clinical Data: Bowel resection.  Evaluate support apparatus.  PORTABLE CHEST - 1 VIEW  Comparison: 01/12/2012.  Findings: Endotracheal tube tip 39 mm from the carina. Postoperative changes of CABG.   Right IJ central line is present with the tip near the cavoatrial junction, probably just within the right atrium.  Slight reverse lordotic projection on today's examination and lower lung volumes are present.  Enteric tube appears unchanged, visualized coursing through the esophagus. Increasing basilar atelectasis.  Cardiomegaly again noted.  IMPRESSION:  1.  Stable support apparatus. 2.  Lower lung volumes with increasing basilar atelectasis.  Original Report Authenticated By: Andreas Newport, M.D.   Dg Chest Port 1 View  01/12/2012  *RADIOLOGY REPORT*  Clinical Data: Status post bowel resection.  PORTABLE CHEST - 1 VIEW  Comparison: PA and lateral chest 03/18/2006.  Findings: The patient has a right IJ catheter with the tip in the right atrium.  The line could be withdrawn 3.5 cm for better positioning.  Endotracheal tube is in place the tip in good position at the level of the clavicular heads.  NG tube courses into the stomach and below the inferior margin of the film.  The patient has basilar atelectatic change, greater on the left. No pneumothorax.  There is cardiomegaly.  IMPRESSION:  1.  Right IJ catheter tip is in the right antrum.  The line could be withdrawn 3.5 cm for better positioning. 2.  ET tube in good position. 3.  Left worse than right basilar atelectasis.  Original Report Authenticated By: Bernadene Bell. D'ALESSIO, M.D.   Dg Abd Acute W/chest  01/12/2012  *RADIOLOGY REPORT*  Clinical Data: Ruptured sigmoid diverticulitis.  ACUTE ABDOMEN SERIES (ABDOMEN 2 VIEW & CHEST 1 VIEW)  Comparison: CT scan, same date.  Findings: There is a large amount of free intraperitoneal air. Please see the CT scan findings. Left lower lobe atelectasis is noted.  Mild cardiac enlargement.  IMPRESSION: Large amount of free interperitoneal air.  Original Report Authenticated By: P. Loralie Champagne, M.D.     Assessment and Plan:  Intraabdominal abscess, s/p eploratory laparotomy, drainage of intraabdominal abscess,  sigmoid colectomy and colostomy.  No signs of sepsis.  Lab 01/14/12 0400 01/13/12 0420 01/12/12 0545 01/11/12 2340  WBC 11.4* 9.6 8.6 8.7   -->  Antibiotics to continue with Invanz monotherapy -->  Surgery following --->push to extubate 1/23  Encephalopathy secondary to general anesthesia / sedation. Now improved.   Good pain control.   -->  D/c sedation -->extubate  Post-operative respiratory failure.  History of COPD.  No evidence of acute bronchospasm.  Lab 01/12/12 2143  PHART 7.422  PCO2ART 46.1*  PO2ART 117.0*   -->  Push to extubate today 1/23 -->IS q4h -->mobilize with PT/OT    History of CAD s/p CABG.  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  Ischemic cardiomyopathy.  Atrial fibrillation.  No signs of active ischemia.  Atrial fibrillation with controlled rate.  Dyslipidemia. -->  Hold ASA, Coreg, Lqsix, Losartan, Simvastatin -->  Cont Heparin per pharmacy  -->if cannot control HR , may need amiodarone drip -->  Digoxin / Metoprolol IV -->  IVF to [facility] -->  CVP q4H  Hematemesis is on the record but no active hematemesis noted and no bloody discharge from NG.  This is on the background of Coumadin, ASA and Indocin.  Stable hemoglobin.  Lab 01/14/12 0400 01/13/12 0420 01/12/12 0545 01/11/12 2340  HCT 33.0* 33.1* 34.2* 34.8*   -->  Protonix q12h IV  Coumadin induced coagulopathy, s/p FFP / Vit K  Lab 01/13/12 0420 01/12/12 1542 01/12/12 0545 01/11/12 2340  INR 1.36 1.52* 2.53* 2.65*   -->  Heparin per pharmacy as above  Diabetes / hyperglycemia  Lab 01/14/12 0735 01/14/12 0408 01/14/12 0022 01/13/12 1908 01/13/12 1555  GLUCAP 113* 130* 129* 121* 147*   -->  Hold Metformin -->  SSI HG protocol  Best practices / Disposition -->  ICU status under PCCM -->  Surgery following -->  Full code -->  Full heparin for DVT Px, afib -->  Protonix for GI Px -->  NPO -->  Family updated at bedside this am, daughter and spouse  The patient is critically ill  with multiple organ systems failure and requires high complexity decision making for assessment and support, frequent evaluation and titration of therapies, application of advanced monitoring technologies and extensive interpretation of multiple databases. Critical Care Time devoted to patient care services described in this note is 40 minutes.  Shan Levans Beeper  [phone]  Cell  [phone]  If no response or cell goes to voicemail, call beeper [phone]  01/14/2012, 8:51 AM

## 2012-01-14 NOTE — Plan of Care (Signed)
Problem: Phase I Progression Outcomes Goal: Other Phase I Outcomes/Goals Outcome: Completed/Met Date Met:  01/14/12 Pt extubated

## 2012-01-14 NOTE — Progress Notes (Signed)
2 Days Post-Op  Subjective:   Objective: Vital signs in last 24 hours: Temp:  [98.3 F (36.8 C)-100 F (37.8 C)] 98.6 F (37 C) (01/23 0409) Pulse Rate:  [70-130] 90  (01/23 0600) Resp:  [8-31] 14  (01/23 0600) BP: (86-147)/(36-94) 114/47 mmHg (01/23 0600) SpO2:  [96 %-100 %] 99 % (01/23 0600) FiO2 (%):  [40 %] 40 % (01/23 0600) Weight:  [173 lb 11.6 oz (78.8 kg)] 173 lb 11.6 oz (78.8 kg) (01/23 0500) Last BM Date: 01/05/12  Intake/Output from previous day: 01/22 0701 - 01/23 0700 In: 1305.3 [I.V.:935.3; NG/GT:120; IV Piggyback:250] Out: 1395 [Urine:955; Emesis/NG output:300; Drains:140] Intake/Output this shift: Total I/O In: 498.3 [I.V.:468.3; NG/GT:30] Out: 775 [Urine:490; Emesis/NG output:200; Drains:85]  PE: Abd-sl firm and distended, stoma dark in places and edematous, serous drain output, dressing dry  Lab Results:   Basename 01/14/12 0400 01/13/12 0420  WBC 11.4* 9.6  HGB 10.7* 11.1*  HCT 33.0* 33.1*  PLT 129* 122*   BMET  Basename 01/14/12 0400 01/13/12 0420  NA 140 140  K 3.6 3.4*  CL 103 100  CO2 29 32  GLUCOSE 130* 122*  BUN 28* 27*  CREATININE 0.94 1.06  CALCIUM 8.1* 8.2*   PT/INR  Basename 01/13/12 0420 01/12/12 1542  LABPROT 17.0* 18.6*  INR 1.36 1.52*   Comprehensive Metabolic Panel:    Component Value Date/Time   NA 140 01/14/2012 0400   K 3.6 01/14/2012 0400   CL 103 01/14/2012 0400   CO2 29 01/14/2012 0400   BUN 28* 01/14/2012 0400   CREATININE 0.94 01/14/2012 0400   GLUCOSE 130* 01/14/2012 0400   CALCIUM 8.1* 01/14/2012 0400   AST 9 01/11/2012 2340   ALT 11 01/11/2012 2340   ALKPHOS 42 01/11/2012 2340   BILITOT 0.5 01/11/2012 2340   PROT 5.5* 01/11/2012 2340   ALBUMIN 2.4* 01/11/2012 2340     Studies/Results: Ct Abdomen Pelvis W Contrast  01/12/2012  **ADDENDUM** CREATED: 01/12/2012 10:15:59  Findings called directly to Dr. Gwenlyn Perking at 10:00am.  **END ADDENDUM** SIGNED BY: Cyndie Chime, M.D.    01/12/2012  *RADIOLOGY REPORT*   Clinical Data: Left lower quadrant abdominal pain.  History of diverticulitis.  CT ABDOMEN AND PELVIS WITH CONTRAST  Technique:  Multidetector CT imaging of the abdomen and pelvis was performed following the standard protocol during bolus administration of intravenous contrast.  Contrast: OMNIPAQUE IOHEXOL 300 MG/ML IV SOLN  Comparison: CT scan 02/01/2009.  Findings: The lung bases demonstrate left basilar atelectasis and cardiac enlargement.  There is a large amount of free interperitoneal air along with a left lower quadrant/upper left pelvic 7 x 7 cm abscess.  Findings are most likely due to ruptured diverticulitis.  The solid abdominal organs are unremarkable except for stable bilateral adrenal gland adenomas and bilateral renal cysts.  The stomach, duodenum and small bowel are unremarkable.  The appendix is normal.  The aorta is normal in caliber.  Moderate atherosclerotic changes. The major branch vessels are patent.  Moderate atherosclerotic calcifications involving the branch vessels.  No mesenteric or retroperitoneal masses or adenopathy.  The bladder appears normal.  Brachytherapy prostate seeds are noted.  The bony structures are unremarkable.  IMPRESSION:  1.  Large amount of free intraperitoneal air and large left lower quadrant/upper left pelvic abscess likely due to perforated diverticulitis. 2.  Left lower lobe atelectasis. 3.  Stable adrenal gland adenomas and bilateral renal cysts.  Original Report Authenticated By: P. Loralie Champagne, M.D.   Dg Chest [place]  1 View  01/12/2012  *RADIOLOGY REPORT*  Clinical Data: Status post bowel resection.  PORTABLE CHEST - 1 VIEW  Comparison: PA and lateral chest 03/18/2006.  Findings: The patient has a right IJ catheter with the tip in the right atrium.  The line could be withdrawn 3.5 cm for better positioning.  Endotracheal tube is in place the tip in good position at the level of the clavicular heads.  NG tube courses into the stomach and below the  inferior margin of the film.  The patient has basilar atelectatic change, greater on the left. No pneumothorax.  There is cardiomegaly.  IMPRESSION:  1.  Right IJ catheter tip is in the right antrum.  The line could be withdrawn 3.5 cm for better positioning. 2.  ET tube in good position. 3.  Left worse than right basilar atelectasis.  Original Report Authenticated By: Bernadene Bell. D'ALESSIO, M.D.   Dg Abd Acute W/chest  01/12/2012  *RADIOLOGY REPORT*  Clinical Data: Ruptured sigmoid diverticulitis.  ACUTE ABDOMEN SERIES (ABDOMEN 2 VIEW & CHEST 1 VIEW)  Comparison: CT scan, same date.  Findings: There is a large amount of free intraperitoneal air. Please see the CT scan findings. Left lower lobe atelectasis is noted.  Mild cardiac enlargement.  IMPRESSION: Large amount of free interperitoneal air.  Original Report Authenticated By: P. Loralie Champagne, M.D.    Anti-infectives: Anti-infectives     Start     Dose/Rate Route Frequency Ordered Stop   01/12/12 2200   ertapenem (INVANZ) 1 g in sodium chloride 0.9 % 50 mL IVPB  Status:  Discontinued        1 g 100 mL/hr over 30 Minutes Intravenous Every 24 hours 01/12/12 2106 01/12/12 2115   01/12/12 1800   ciprofloxacin (CIPRO) IVPB 400 mg  Status:  Discontinued        400 mg 200 mL/hr over 60 Minutes Intravenous Every 12 hours 01/12/12 0102 01/12/12 1018   01/12/12 1400   metroNIDAZOLE (FLAGYL) IVPB 500 mg  Status:  Discontinued        500 mg 100 mL/hr over 60 Minutes Intravenous Every 8 hours 01/12/12 0102 01/12/12 1018   01/12/12 1030   ertapenem (INVANZ) 1 g in sodium chloride 0.9 % 50 mL IVPB        1 g 100 mL/hr over 30 Minutes Intravenous Every 24 hours 01/12/12 1018     01/12/12 0115   ciprofloxacin (CIPRO) IVPB 400 mg        400 mg 200 mL/hr over 60 Minutes Intravenous  Once 01/12/12 0105 01/12/12 0613   01/12/12 0115   metroNIDAZOLE (FLAGYL) IVPB 500 mg        500 mg 100 mL/hr over 60 Minutes Intravenous  Once 01/12/12 0105 01/12/12  0444          Assessment Principal Problem:  *Diverticulitis of large intestine with perforation s/p sigmoid colectomy and colostomy 01/12/12-postop ileus as expected Active Problems:  SIRS (systemic inflammatory response syndrome)    LOS: 3 days   Plan: Wait for return of bowel function.  Check wound 1/24.   Takeyah Wieman J 01/14/2012

## 2012-01-14 NOTE — Procedures (Signed)
Extubation Procedure Note  Patient Details:   Name: [name] DOB: 1941/02/04 MRN: [id number]   Airway Documentation:   Pt extubated per MD order, placed on 4L [place], sats 99%, HR 112, RR 22, BBS rhonchus. Pt able to vocalize. Positive cuff leak, IS 750.  Evaluation  O2 sats: stable throughout and currently acceptable Complications: No apparent complications Patient  tolerate procedure well. Bilateral Breath Sounds: Clear;Other (Comment) (coarse) Suctioning: Airway Yes  Arloa Koh 01/14/2012, 9:14 AM

## 2012-01-15 ENCOUNTER — Inpatient Hospital Stay (HOSPITAL_COMMUNITY): Payer: Medicare Other

## 2012-01-15 DIAGNOSIS — I4891 Unspecified atrial fibrillation: Secondary | ICD-10-CM | POA: Diagnosis not present

## 2012-01-15 DIAGNOSIS — J9819 Other pulmonary collapse: Secondary | ICD-10-CM | POA: Diagnosis not present

## 2012-01-15 DIAGNOSIS — K651 Peritoneal abscess: Secondary | ICD-10-CM | POA: Diagnosis not present

## 2012-01-15 DIAGNOSIS — G9340 Encephalopathy, unspecified: Secondary | ICD-10-CM

## 2012-01-15 DIAGNOSIS — J9600 Acute respiratory failure, unspecified whether with hypoxia or hypercapnia: Secondary | ICD-10-CM | POA: Diagnosis not present

## 2012-01-15 LAB — GLUCOSE, CAPILLARY
Glucose-Capillary: 105 mg/dL — ABNORMAL HIGH (ref 70–99)
Glucose-Capillary: 110 mg/dL — ABNORMAL HIGH (ref 70–99)
Glucose-Capillary: 119 mg/dL — ABNORMAL HIGH (ref 70–99)
Glucose-Capillary: 126 mg/dL — ABNORMAL HIGH (ref 70–99)
Glucose-Capillary: 97 mg/dL (ref 70–99)

## 2012-01-15 LAB — BASIC METABOLIC PANEL
BUN: 23 mg/dL (ref 6–23)
Chloride: 105 mEq/L (ref 96–112)
GFR calc Af Amer: >90 mL/min (ref >90)
GFR calc non Af Amer: 88 mL/min — ABNORMAL LOW (ref >90)
Potassium: 3.8 mEq/L (ref 3.5–5.1)

## 2012-01-15 LAB — CBC
HCT: 32 % — ABNORMAL LOW (ref 39.0–52.0)
Hemoglobin: 10.4 g/dL — ABNORMAL LOW (ref 13.0–17.0)
MCHC: 32.5 g/dL (ref 30.0–36.0)
RDW: 15.7 % — ABNORMAL HIGH (ref 11.5–15.5)
WBC: 10.2 10*3/uL (ref 4.0–10.5)

## 2012-01-15 LAB — HEPARIN LEVEL (UNFRACTIONATED): Heparin Unfractionated: 0.39 IU/mL (ref 0.30–0.70)

## 2012-01-15 MED ORDER — METOPROLOL TARTRATE 1 MG/ML IV SOLN
7.5000 mg | INTRAVENOUS | Status: DC
  Administered 2012-01-15 – 2012-01-18 (×19): 7.5 mg via INTRAVENOUS
  Filled 2012-01-15 (×3): qty 10
  Filled 2012-01-15: qty 5
  Filled 2012-01-15 (×19): qty 10

## 2012-01-15 NOTE — Consult Note (Signed)
Subjective: Patient awake but groggy.  Comp of some abdomenal pain. Objective: Filed Vitals:   01/15/12 0500 01/15/12 0600 01/15/12 0700 01/15/12 0728  BP: 113/51 117/46 132/65   Pulse: 62 67 90   Temp:    98.6 F (37 C)  TempSrc:    Oral  Resp: 26 25 26    Height:      Weight:  170 lb 10.2 oz (77.4 kg)    SpO2: 95% 97% 97%    Weight change: -3 lb 1.4 oz (-1.4 kg)  Intake/Output Summary (Last 24 hours) at 01/15/12 0811 Last data filed at 01/15/12 0700  Gross per 24 hour  Intake   1173 ml  Output   1460 ml  Net   -287 ml    General:Patient in NAD Neck:  JVP is normal Heart: Irreg rate and rhythm.  Crisp valve sounds..  Lungs: Coarse rhonchi. Abd:  Positve bowel sounds  Deferred otherwise due to pain. Exemities:  No edema.  Feet warm.  Telemetry:  Afib  AVg rate in 110s (100 to 130)  1 - 20 beat NSVT.   Lab Results: Results for orders placed during the hospital encounter of 01/11/12 (from the past 24 hour(s))  GLUCOSE, CAPILLARY     Status: Abnormal   Collection Time   01/14/12 11:38 AM      Component Value Range   Glucose-Capillary 124 (*) 70 - 99 (mg/dL)   Comment 1 Documented in Chart     Comment 2 Notify RN    GLUCOSE, CAPILLARY     Status: Abnormal   Collection Time   01/14/12  4:27 PM      Component Value Range   Glucose-Capillary 116 (*) 70 - 99 (mg/dL)  GLUCOSE, CAPILLARY     Status: Abnormal   Collection Time   01/14/12  7:23 PM      Component Value Range   Glucose-Capillary 121 (*) 70 - 99 (mg/dL)  GLUCOSE, CAPILLARY     Status: Abnormal   Collection Time   01/15/12 12:09 AM      Component Value Range   Glucose-Capillary 105 (*) 70 - 99 (mg/dL)  GLUCOSE, CAPILLARY     Status: Abnormal   Collection Time   01/15/12  3:57 AM      Component Value Range   Glucose-Capillary 119 (*) 70 - 99 (mg/dL)  HEPARIN LEVEL (UNFRACTIONATED)     Status: Normal   Collection Time   01/15/12  4:25 AM      Component Value Range   Heparin Unfractionated 0.39  0.30 - 0.70  (IU/mL)  BASIC METABOLIC PANEL     Status: Abnormal   Collection Time   01/15/12  4:25 AM      Component Value Range   Sodium 142  135 - 145 (mEq/L)   Potassium 3.8  3.5 - 5.1 (mEq/L)   Chloride 105  96 - 112 (mEq/L)   CO2 31  19 - 32 (mEq/L)   Glucose, Bld 125 (*) 70 - 99 (mg/dL)   BUN 23  6 - 23 (mg/dL)   Creatinine, Ser 9.60  0.50 - 1.35 (mg/dL)   Calcium 8.4  8.4 - 45.4 (mg/dL)   GFR calc non Af Amer 88 (*) >90 (mL/min)   GFR calc Af Amer >90  >90 (mL/min)  CBC     Status: Abnormal   Collection Time   01/15/12  4:25 AM      Component Value Range   WBC 10.2  4.0 - 10.5 (K/uL)  RBC 3.43 (*) 4.22 - 5.81 (MIL/uL)   Hemoglobin 10.4 (*) 13.0 - 17.0 (g/dL)   HCT 16.1 (*) 09.6 - 52.0 (%)   MCV 93.3  78.0 - 100.0 (fL)   MCH 30.3  26.0 - 34.0 (pg)   MCHC 32.5  30.0 - 36.0 (g/dL)   RDW 04.5 (*) 40.9 - 15.5 (%)   Platelets 143 (*) 150 - 400 (K/uL)  GLUCOSE, CAPILLARY     Status: Abnormal   Collection Time   01/15/12  7:30 AM      Component Value Range   Glucose-Capillary 119 (*) 70 - 99 (mg/dL)   Comment 1 Documented in Chart     Comment 2 Notify RN      Studies/Results: Dg Chest Port 1 View  01/15/2012  *RADIOLOGY REPORT*  Clinical Data: Respiratory failure.  Follow-up atelectasis.  PORTABLE CHEST - 1 VIEW  Comparison: 01/14/2012  Findings: Endotracheal tube has been removed.  Jugular center venous catheter and nasogastric tube remain in place.  Bibasilar atelectasis is again seen without significant change. Cardiomegaly stable.  Prior CABG again noted.  IMPRESSION: Stable bibasilar atelectasis following extubation.  Original Report Authenticated By: Danae Orleans, M.D.   Dg Chest Port 1 View  01/14/2012  *RADIOLOGY REPORT*  Clinical Data: Bowel resection.  Evaluate support apparatus.  PORTABLE CHEST - 1 VIEW  Comparison: 01/12/2012.  Findings: Endotracheal tube tip 39 mm from the carina. Postoperative changes of CABG.  Right IJ central line is present with the tip near the  cavoatrial junction, probably just within the right atrium.  Slight reverse lordotic projection on today's examination and lower lung volumes are present.  Enteric tube appears unchanged, visualized coursing through the esophagus. Increasing basilar atelectasis.  Cardiomegaly again noted.  IMPRESSION:  1.  Stable support apparatus. 2.  Lower lung volumes with increasing basilar atelectasis.  Original Report Authenticated By: Andreas Newport, M.D.    Medications: I have reviewed the patient's current medications.  1.  Atrial fibrillation.  Rate is still increased.  BP is adequate.  I would recomm increasing IV lopressor.  COntinue DIgoxion. ABdomenal exam with bowel sounds.  Hopeful to switch to po soon.    2.  NSVT.  Patient with history of NSVT (EP study in 2002 without specific findings).  I would recomm increasing b blockade.  3.  CAD(s/p CABG x 2 in 1989-LIMA to LAD; SVG ot OM.  Cath in 2005  Patent grafts; Myoview in 2009 with inferior infarct.  No ischemia.)  No evidence of active ischemia now.  I would Rx abd pain.  WIll increase lopressor.  4.  S/p MVR with mechanical valve in 2007.  On Heparin for now.  5.  CHF.  Volume status is not bad.  Continue to follow I's and O's.    LOS: 4 days   Dietrich Pates 01/15/2012, 8:11 AM

## 2012-01-15 NOTE — Progress Notes (Signed)
Name: [name] MRN: [id number] DOB: Mar 03, 1941    LOS: 4  PCCM PROGRESS  NOTE  History of Present Illness:  Admitted to [facility] with COPD exacerbation on 1/14.  Developed abdominal pain.  CT scan on 1/18 demonstrated diverticulitis and free air.  Started on antibiotics.  Transferred to [facility] on 1/20.  Repeat  CT scan demonstrated pneumoperitoneum and developing abscess.  Brought to OR on 1/21 for exploratory laparotomy, drainage of intraabdominal abscess, sigmoid colectomy and colostomy.  Lines / Drains: 1/21  ETT>>1/23 1/21  NGT>> 1/21  Foley>> 1/21  R IJ TLC>> 1/21  JP drain>>   Cultures: None  Antibiotics: 1/18  Cipro>>> 1/21 1/18  Flagyl (abd abscess)>>1/21 1/21  Ertapenem (abd abscess)>>>  Tests / Events: 1/20  Abdominal CT>>>pneumoperitoneum / intraabdominal abscess 1/21  Exploratory laparotomy, drainage of intraabdominal abscess, sigmoid colectomy and colostomy  SUBJ: Improved, alert, productive cough  Past Medical History  Diagnosis Date  . Atrial fibrillation   . Hypertension   . Hyperkalemia   . Acute systolic heart failure   . Coronary artery disease     Cath 2005- patent LIMA-LAD, SVG-OM grafts, moderate MR  . Prostate cancer    Past Surgical History  Procedure Date  . Bypass graft 1989    LIMA-LAD, SVG-OM  . Mitral valve replacement 2007    Mechanical, performed at [facility]  . Hernia repair   . External ear surgery     drum  . Colon resection 01/12/2012    Procedure: COLON RESECTION;  Surgeon: Adolph Pollack, MD;  Location: [facility] OR;  Service: General;  Laterality: N/A;   Prior to Admission medications   Medication Sig Start Date End Date Taking? Authorizing Provider  albuterol-ipratropium (COMBIVENT) 18-103 MCG/ACT inhaler Inhale 2 puffs into the lungs as needed. For shortness of breath   Yes Historical Provider, MD  aspirin EC 81 MG tablet Take 81 mg by mouth daily.   Yes Historical Provider, MD  budesonide-formoterol (SYMBICORT) 160-4.5  MCG/ACT inhaler Inhale 2 puffs into the lungs 2 (two) times daily.   Yes Historical Provider, MD  carvedilol (COREG) 12.5 MG tablet Take 12.5 mg by mouth 2 (two) times daily with a meal.  07/31/11  Yes Historical Provider, MD  Cholecalciferol (VITAMIN D3) 1000 UNITS CAPS Take 1 capsule by mouth daily.     Yes Historical Provider, MD  digoxin (LANOXIN) 0.125 MG tablet Take 125 mcg by mouth daily.   Yes Historical Provider, MD  furosemide (LASIX) 20 MG tablet Take 20 mg by mouth daily.   Yes Historical Provider, MD  guaiFENesin (MUCINEX) 600 MG 12 hr tablet Take 1,200 mg by mouth at bedtime.   Yes Historical Provider, MD  indomethacin (INDOCIN) 50 MG capsule Take 50 mg by mouth 3 (three) times daily as needed.   Yes Historical Provider, MD  ipratropium-albuterol (DUONEB) 0.5-2.5 (3) MG/3ML SOLN Take 3 mLs by nebulization as needed. For shortness of breath   Yes Historical Provider, MD  losartan (COZAAR) 100 MG tablet Take 100 mg by mouth daily.  07/30/11  Yes Historical Provider, MD  metFORMIN (GLUCOPHAGE) 500 MG tablet Take 500 mg by mouth 2 (two) times daily with a meal.  08/26/11  Yes Historical Provider, MD  nitrofurantoin (MACRODANTIN) 100 MG capsule Take 100 mg by mouth daily.   Yes Historical Provider, MD  OVER THE COUNTER MEDICATION Take 1 tablet by mouth daily. Preservision Eye Vitamins   Yes Historical Provider, MD  simvastatin (ZOCOR) 40 MG tablet Take 40 mg  by mouth at bedtime.     Yes Historical Provider, MD  Tamsulosin HCl (FLOMAX) 0.4 MG CAPS Take 0.4 mg by mouth daily.  07/30/11  Yes Historical Provider, MD  warfarin (COUMADIN) 2.5 MG tablet Take 2.5 mg by mouth daily. Take 2.5 mg on Mon, Tues, Thurs,Fri, and Sun. Take 5 mg on Wed and Saturday.   Yes Historical Provider, MD   Allergies Allergies  Allergen Reactions  . Codeine     REACTION: hives  . Sulfonamide Derivatives     REACTION: hives  . Tylenol-Codeine     REACTION: hives   Family History Family History  Problem Relation Age  of Onset  . Coronary artery disease Mother   . Stroke Father   . Heart attack Mother     x10  . Heart attack Brother     with bypass   . Heart attack Sister     with bypass   . Cancer Sister   . Cancer Brother    Social History  reports that he quit smoking about 24 years ago. His smoking use included Cigarettes. He has a 92 pack-year smoking history. He has never used smokeless tobacco. He reports that he does not drink alcohol or use illicit drugs.  Review Of Systems  11 points review of systems is negative with an exception of listed in HPI.  Vital Signs: Temp:  [97.8 F (36.6 C)-98.7 F (37.1 C)] 98.6 F (37 C) (01/24 0728) Pulse Rate:  [60-122] 90  (01/24 0700) Resp:  [17-26] 26  (01/24 0700) BP: (93-141)/(40-86) 132/65 mmHg (01/24 0700) SpO2:  [92 %-99 %] 97 % (01/24 0700) Weight:  [77.4 kg (170 lb 10.2 oz)] 77.4 kg (170 lb 10.2 oz) (01/24 0600) I/O last 3 completed shifts: In: 1786.8 [I.V.:1526.8; NG/GT:210; IV Piggyback:50] Out: 2380 [Urine:1425; Emesis/NG output:750; Drains:205]  Physical Examination: General:  Awake and alert, no distress Neuro: awake and alert HEENT:  NGT,  Neck:  No JVD   Cardiovascular: Irregularly irregular rate, no murmurs Lungs:  Bilateral diminished air entry, no wheezing Abdomen:  Viable colostomy, surgically dressing clean / dry / intact Musculoskeletal: No edema Skin:  No rash      Labs and Imaging:  Reviewed.  Please refer to the Assessment and Plan section for relevant results. Dg Chest Port 1 View  01/15/2012  *RADIOLOGY REPORT*  Clinical Data: Respiratory failure.  Follow-up atelectasis.  PORTABLE CHEST - 1 VIEW  Comparison: 01/14/2012  Findings: Endotracheal tube has been removed.  Jugular center venous catheter and nasogastric tube remain in place.  Bibasilar atelectasis is again seen without significant change. Cardiomegaly stable.  Prior CABG again noted.  IMPRESSION: Stable bibasilar atelectasis following extubation.   Original Report Authenticated By: Danae Orleans, M.D.   Dg Chest Port 1 View  01/14/2012  *RADIOLOGY REPORT*  Clinical Data: Bowel resection.  Evaluate support apparatus.  PORTABLE CHEST - 1 VIEW  Comparison: 01/12/2012.  Findings: Endotracheal tube tip 39 mm from the carina. Postoperative changes of CABG.  Right IJ central line is present with the tip near the cavoatrial junction, probably just within the right atrium.  Slight reverse lordotic projection on today's examination and lower lung volumes are present.  Enteric tube appears unchanged, visualized coursing through the esophagus. Increasing basilar atelectasis.  Cardiomegaly again noted.  IMPRESSION:  1.  Stable support apparatus. 2.  Lower lung volumes with increasing basilar atelectasis.  Original Report Authenticated By: Andreas Newport, M.D.     Assessment and Plan:  Intraabdominal abscess,  s/p eploratory laparotomy, drainage of intraabdominal abscess, sigmoid colectomy and colostomy.  No signs of sepsis.  Lab 01/15/12 0425 01/14/12 0400 01/13/12 0420 01/12/12 0545 01/11/12 2340  WBC 10.2 11.4* 9.6 8.6 8.7   -->  Antibiotics to continue with Invanz monotherapy -->  Surgery following    Post-operative respiratory failure RESOLVED.  History of COPD.  No evidence of acute bronchospasm.  Lab 01/12/12 2143  PHART 7.422  PCO2ART 46.1*  PO2ART 117.0*   -->IS q4h -->flutter valve -->BD therapy -->mobilize with PT/OT    History of CAD s/p CABG.  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  Ischemic cardiomyopathy.  Atrial fibrillation.  No signs of active ischemia.  Atrial fibrillation with controlled rate.  Dyslipidemia. -->  Hold ASA, Coreg, Lqsix, Losartan, Simvastatin -->  Cont Heparin per pharmacy  ->increased BB per cardiology  Hematemesis is on the record but no active hematemesis noted and no bloody discharge from NG.  This is on the background of Coumadin, ASA and Indocin.  Stable hemoglobin.  Lab 01/15/12 0425  01/14/12 0400 01/13/12 0420 01/12/12 0545 01/11/12 2340  HCT 32.0* 33.0* 33.1* 34.2* 34.8*   -->  Protonix q12h IV  Coumadin induced coagulopathy, s/p FFP / Vit K  Lab 01/13/12 0420 01/12/12 1542 01/12/12 0545 01/11/12 2340  INR 1.36 1.52* 2.53* 2.65*   -->  Heparin per pharmacy as above  Diabetes / hyperglycemia  Lab 01/15/12 0730 01/15/12 0357 01/15/12 0009 01/14/12 1923 01/14/12 1627  GLUCAP 119* 119* 105* 121* 116*   -->  SSI HG protocol  Best practices / Disposition -->  ICU status under PCCM -->  Surgery following -->  Full code -->  Full heparin for DVT Px, afib -->  Protonix for GI Px -->  NPO  Shan Levans Beeper  [phone]  Cell  [phone]  If no response or cell goes to voicemail, call beeper [phone]  01/15/2012, 8:32 AM

## 2012-01-15 NOTE — Progress Notes (Signed)
ANTICOAGULATION CONSULT NOTE - Follow Up Consult  Pharmacy Consult for Heparin Indication: atrial fibrillation and MVR  Allergies  Allergen Reactions  . Codeine     REACTION: hives  . Sulfonamide Derivatives     REACTION: hives  . Tylenol-Codeine     REACTION: hives    Patient Measurements: Height: 5' 4.8" (164.6 cm) Weight: 170 lb 10.2 oz (77.4 kg) IBW/kg (Calculated) : 61.04   Vital Signs: Temp: 98.6 F (37 C) (01/24 0728) Temp src: Oral (01/24 0728) BP: 132/55 mmHg (01/24 1300) Pulse Rate: 71  (01/24 1300)  Labs:  Basename 01/15/12 0425 01/14/12 0650 01/14/12 0400 01/13/12 2300 01/13/12 0420 01/12/12 1542  HGB 10.4* -- 10.7* -- -- --  HCT 32.0* -- 33.0* -- 33.1* --  PLT 143* -- 129* -- 122* --  APTT -- -- -- -- -- --  LABPROT -- -- -- -- 17.0* 18.6*  INR -- -- -- -- 1.36 1.52*  HEPARINUNFRC 0.39 0.36 -- 0.23* -- --  CREATININE 0.79 -- 0.94 -- 1.06 --  CKTOTAL -- -- -- -- -- --  CKMB -- -- -- -- -- --  TROPONINI -- -- -- -- -- --   Estimated Creatinine Clearance: 79.8 ml/min (by C-G formula based on Cr of 0.79).   Medications:  Scheduled:    . acyclovir ointment   Topical Q3H  . antiseptic oral rinse  1 application Mouth Rinse QID  . chlorhexidine  15 mL Mouth/Throat BID  . digoxin  0.125 mg Intravenous Daily  . ertapenem  1 g Intravenous Q24H  . insulin aspart  0-20 Units Subcutaneous Q4H  . ipratropium  0.5 mg Nebulization Q6H  . latanoprost  1 drop Both Eyes QHS  . levalbuterol  0.63 mg Nebulization Q6H  . metoprolol  7.5 mg Intravenous Q4H  . pantoprazole (PROTONIX) IV  40 mg Intravenous Q12H  . DISCONTD: metoprolol  5 mg Intravenous Q4H   Infusions:    . heparin 1,600 Units/hr (01/15/12 1053)  . lactated ringers 20 mL/hr at 01/15/12 6213    Assessment: 71 y/o male patient with h/o afib and MVR receiving heparin gtt for bridge therapy. Heparin level therapeutic, no bleeding reported.   Goal of Therapy:  Heparin level =0.3-0.7   Plan:    Continue heparin gtt at 1600 units/hr and f/u in am.  Verlene Mayer, PharmD, BCPS Pager [phone] 01/15/2012,1:59 PM

## 2012-01-15 NOTE — Progress Notes (Signed)
3 Days Post-Op  Subjective: Extubated.  Sore.  Objective: Vital signs in last 24 hours: Temp:  [97.8 F (36.6 C)-98.7 F (37.1 C)] 98.6 F (37 C) (01/24 0728) Pulse Rate:  [60-122] 90  (01/24 0700) Resp:  [17-26] 26  (01/24 0700) BP: (93-141)/(40-86) 132/65 mmHg (01/24 0700) SpO2:  [92 %-99 %] 97 % (01/24 0700) Weight:  [170 lb 10.2 oz (77.4 kg)] 170 lb 10.2 oz (77.4 kg) (01/24 0600) Last BM Date: 01/05/12  Intake/Output from previous day: 01/23 0701 - 01/24 0700 In: 1245.2 [I.V.:1015.2; NG/GT:180; IV Piggyback:50] Out: 1605 [Urine:935; Emesis/NG output:550; Drains:120] Intake/Output this shift:    PE: Abd-slightly firm and distended, colostomy edematous and viable with minimal bloody output, open wound clean, serous drain output  Lab Results:   Basename 01/15/12 0425 01/14/12 0400  WBC 10.2 11.4*  HGB 10.4* 10.7*  HCT 32.0* 33.0*  PLT 143* 129*   BMET  Basename 01/15/12 0425 01/14/12 0400  NA 142 140  K 3.8 3.6  CL 105 103  CO2 31 29  GLUCOSE 125* 130*  BUN 23 28*  CREATININE 0.79 0.94  CALCIUM 8.4 8.1*   PT/INR  Basename 01/13/12 0420 01/12/12 1542  LABPROT 17.0* 18.6*  INR 1.36 1.52*   Comprehensive Metabolic Panel:    Component Value Date/Time   NA 142 01/15/2012 0425   K 3.8 01/15/2012 0425   CL 105 01/15/2012 0425   CO2 31 01/15/2012 0425   BUN 23 01/15/2012 0425   CREATININE 0.79 01/15/2012 0425   GLUCOSE 125* 01/15/2012 0425   CALCIUM 8.4 01/15/2012 0425   AST 9 01/11/2012 2340   ALT 11 01/11/2012 2340   ALKPHOS 42 01/11/2012 2340   BILITOT 0.5 01/11/2012 2340   PROT 5.5* 01/11/2012 2340   ALBUMIN 2.4* 01/11/2012 2340     Studies/Results: Dg Chest Port 1 View  01/15/2012  *RADIOLOGY REPORT*  Clinical Data: Respiratory failure.  Follow-up atelectasis.  PORTABLE CHEST - 1 VIEW  Comparison: 01/14/2012  Findings: Endotracheal tube has been removed.  Jugular center venous catheter and nasogastric tube remain in place.  Bibasilar atelectasis is again  seen without significant change. Cardiomegaly stable.  Prior CABG again noted.  IMPRESSION: Stable bibasilar atelectasis following extubation.  Original Report Authenticated By: Danae Orleans, M.D.   Dg Chest Port 1 View  01/14/2012  *RADIOLOGY REPORT*  Clinical Data: Bowel resection.  Evaluate support apparatus.  PORTABLE CHEST - 1 VIEW  Comparison: 01/12/2012.  Findings: Endotracheal tube tip 39 mm from the carina. Postoperative changes of CABG.  Right IJ central line is present with the tip near the cavoatrial junction, probably just within the right atrium.  Slight reverse lordotic projection on today's examination and lower lung volumes are present.  Enteric tube appears unchanged, visualized coursing through the esophagus. Increasing basilar atelectasis.  Cardiomegaly again noted.  IMPRESSION:  1.  Stable support apparatus. 2.  Lower lung volumes with increasing basilar atelectasis.  Original Report Authenticated By: Andreas Newport, M.D.    Anti-infectives: Anti-infectives     Start     Dose/Rate Route Frequency Ordered Stop   01/12/12 2200   ertapenem (INVANZ) 1 g in sodium chloride 0.9 % 50 mL IVPB  Status:  Discontinued        1 g 100 mL/hr over 30 Minutes Intravenous Every 24 hours 01/12/12 2106 01/12/12 2115   01/12/12 1800   ciprofloxacin (CIPRO) IVPB 400 mg  Status:  Discontinued        400 mg 200 mL/hr over  60 Minutes Intravenous Every 12 hours 01/12/12 0102 01/12/12 1018   01/12/12 1400   metroNIDAZOLE (FLAGYL) IVPB 500 mg  Status:  Discontinued        500 mg 100 mL/hr over 60 Minutes Intravenous Every 8 hours 01/12/12 0102 01/12/12 1018   01/12/12 1030   ertapenem (INVANZ) 1 g in sodium chloride 0.9 % 50 mL IVPB        1 g 100 mL/hr over 30 Minutes Intravenous Every 24 hours 01/12/12 1018     01/12/12 0115   ciprofloxacin (CIPRO) IVPB 400 mg        400 mg 200 mL/hr over 60 Minutes Intravenous  Once 01/12/12 0105 01/12/12 0613   01/12/12 0115   metroNIDAZOLE (FLAGYL) IVPB  500 mg        500 mg 100 mL/hr over 60 Minutes Intravenous  Once 01/12/12 0105 01/12/12 0444          Assessment Principal Problem:  *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  Wait for ileus to resolve.   Madelena Maturin J 01/15/2012

## 2012-01-15 NOTE — Evaluation (Signed)
Physical Therapy Evaluation Patient Details Name: [name] MRN: [id number] DOB: 06/30/1941 Today's Date: 01/15/2012  Problem List:  Patient Active Problem List  Diagnoses  . DIAB W/O COMP TYPE II/UNS NOT STATED UNCNTRL  . HYPERLIPIDEMIA-MIXED  . HYPERKALEMIA  . HYPERTENSION, UNSPECIFIED  . CAD, ARTERY BYPASS GRAFT  . ATRIAL FIBRILLATION  . SYSTOLIC HEART FAILURE, ACUTE  . MITRAL VALVE REPLACEMENT, HX OF  . Mitral valve disorder  . Long term current use of anticoagulant  . COPD (chronic obstructive pulmonary disease)  . 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 SIRS (systemic inflammatory response syndrome)    Past Medical History:  Past Medical History  Diagnosis Date  . Atrial fibrillation   . Hypertension   . Hyperkalemia   . Acute systolic heart failure   . Coronary artery disease     Cath 2005- patent LIMA-LAD, SVG-OM grafts, moderate MR  . Prostate cancer    Past Surgical History:  Past Surgical History  Procedure Date  . Bypass graft 1989    LIMA-LAD, SVG-OM  . Mitral valve replacement 2007    Mechanical, performed at [facility]  . Hernia repair   . External ear surgery     drum  . Colon resection 01/12/2012    Procedure: COLON RESECTION;  Surgeon: Adolph Pollack, MD;  Location: [facility] OR;  Service: General;  Laterality: N/A;    PT Assessment/Plan/Recommendation PT Assessment Clinical Impression Statement: Admitted to [facility] with COPD exacerbation on 1/14.  Developed abdominal pain.  CT scan on 1/18 demonstrated diverticulitis and free air.  Started on antibiotics.  Transferred to [facility] on 1/20.  Repeat  CT scan demonstrated pneumoperitoneum and developing abscess.  Brought to OR on 1/21 for exploratory laparotomy, drainage of intraabdominal abscess, sigmoid colectomy and colostomy. Upon PT eval today pt's mobility limited by pain and generalized weakness in the setting of surgery. Will benefit  physical therapy in the acute setting to maximize mobilty and independence so as to increase safety and decrease BOC on d/c home. Pt will likely progress well once pain more controlled. Rec HHPT and 3in1 for d/c. Family very supportive and want to take him home. Willing to do whatever they need to bring him home.  PT Recommendation/Assessment: Patient will need skilled PT in the acute care venue PT Problem List: Decreased strength;Decreased activity tolerance;Decreased mobility;Decreased knowledge of use of DME;Pain;Other (comment) (abdominal incision) PT Therapy Diagnosis : Difficulty walking;Generalized weakness;Acute pain PT Plan PT Frequency: Min 3X/week PT Treatment/Interventions: DME instruction;Gait training;Stair training;Functional mobility training;Neuromuscular re-education;Balance training;Therapeutic exercise;Therapeutic activities;Patient/family education PT Recommendation Recommendations for Other Services: OT consult Follow Up Recommendations: Home health PT;Supervision/Assistance - 24 hour Equipment Recommended: 3 in 1 bedside comode PT Goals  Acute Rehab PT Goals PT Goal Formulation: With patient Pt will Roll Supine to Right Side: with min assist PT Goal: Rolling Supine to Right Side - Progress: Goal set today Pt will Roll Supine to Left Side: with min assist PT Goal: Rolling Supine to Left Side - Progress: Goal set today Pt will go Supine/Side to Sit: with min assist PT Goal: Supine/Side to Sit - Progress: Goal set today Pt will Sit at Edge of Bed: with min assist PT Goal: Sit at Edge Of Bed - Progress: Goal set today Pt will go Sit to Supine/Side: with min assist PT Goal: Sit to Supine/Side - Progress: Goal set today Pt will go Sit to Stand: with modified independence PT Goal: Sit to Stand - Progress:  Goal set today Pt will go Stand to Sit: with modified independence Pt will Transfer Bed to Chair/Chair to Bed: with modified independence PT Transfer Goal: Bed to  Chair/Chair to Bed - Progress: Goal set today Pt will Ambulate: >150 feet;with modified independence;with least restrictive assistive device PT Goal: Ambulate - Progress: Goal set today Pt will Go Up / Down Stairs: 3-5 stairs;with min assist;with least restrictive assistive device PT Goal: Up/Down Stairs - Progress: Goal set today  PT Evaluation Precautions/Restrictions  Precautions Precautions: Fall Prior Functioning  Home Living Lives With: Spouse Receives Help From: Family (3 nurses in family (2 daughters, son-in-law)) Type of Home: House Home Layout: One level Home Access: Stairs to enter Entrance Stairs-Rails: Right Entrance Stairs-Number of Steps: 4 Bathroom Shower/Tub: Psychologist, counselling;Door [employer] Toilet: Standard Home Adaptive Equipment: Engineer, drilling - rolling Prior Function Level of Independence: Independent with basic ADLs;Independent with homemaking with ambulation;Independent with gait;Independent with transfers Driving: Yes 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) Comments: very active per daughter   Sensation/Coordination Sensation Light Touch: Appears Intact Coordination Gross Motor Movements are Fluid and Coordinated: Yes Fine Motor Movements are Fluid and Coordinated: Yes Extremity Assessment RUE Assessment RUE Assessment: Within Functional Limits LUE Assessment LUE Assessment: Within Functional Limits RLE Assessment RLE Assessment:  (grossly 4/5) LLE Assessment LLE Assessment:  (grossly 4/5) Mobility (including Balance) Bed Mobility Bed Mobility: Yes Rolling Left: 1: +2 Total assist Rolling Left Details (indicate cue type and reason): +2totalpt30% (pt holding onto pillow); assist for sequencing and follow through secondary to pain Left Sidelying to Sit: 1: +2 Total assist Left Sidelying to Sit Details (indicate cue type and reason): +2totalpt25% for initiation and follow through; pt c/o pain throughout  movement Sitting - Scoot to Edge of Bed: 2: Max assist Sitting - Scoot to [employer] of Bed Details (indicate cue type and reason): use of pad to scoot hips EOB and cued to put feet on the floor to splint for pain as pt tends to extend legs out when sitting EOB Transfers Transfers: Yes Sit to Stand: 1: +2 Total assist;Without upper extremity assist;From bed;From elevated surface Sit to Stand Details (indicate cue type and reason): +2totalpt60%; stood with bilateral faciliation for hip/knee extension and anterior translation over feet; stays in flexed position secondary to abdominal pain and holds pillow throughout transfer Stand to Sit: 3: Mod assist Stand to Sit Details: cues to sequence and facilitation to guide hips to chair Ambulation/Gait Ambulation/Gait: Yes Ambulation/Gait Assistance: 1: +2 Total assist (70%) Ambulation/Gait Assistance Details (indicate cue type and reason): pt took 5-6 side steps with +2totalpt70%; cues for hip extension and upright trunk (pt held cardiac pillow throughout) Assistive device: None Gait Pattern: Shuffle;Trunk flexed  Posture/Postural Control Posture/Postural Control: Postural limitations Postural Limitations: flexed trunk Balance Balance Assessed: No Exercise  Total Joint Exercises Ankle Circles/Pumps: AROM;Both;10 reps;Supine End of Session PT - End of Session Equipment Utilized During Treatment: Gait belt Activity Tolerance: Patient tolerated treatment well;Patient limited by pain Patient left: in chair;with call bell in reach;with family/visitor present Nurse Communication: Mobility status for transfers General Behavior During Session: [facility] for tasks performed Cognition: [facility] for tasks performed  [facility] HELEN 01/15/2012, 12:36 PM

## 2012-01-15 NOTE — Progress Notes (Signed)
UR Completed.  [name] [name] [phone] 01/15/2012  

## 2012-01-16 ENCOUNTER — Inpatient Hospital Stay (HOSPITAL_COMMUNITY): Payer: Medicare Other

## 2012-01-16 DIAGNOSIS — I4891 Unspecified atrial fibrillation: Secondary | ICD-10-CM | POA: Diagnosis not present

## 2012-01-16 DIAGNOSIS — J9819 Other pulmonary collapse: Secondary | ICD-10-CM | POA: Diagnosis not present

## 2012-01-16 DIAGNOSIS — R918 Other nonspecific abnormal finding of lung field: Secondary | ICD-10-CM | POA: Diagnosis not present

## 2012-01-16 DIAGNOSIS — G9340 Encephalopathy, unspecified: Secondary | ICD-10-CM | POA: Diagnosis not present

## 2012-01-16 DIAGNOSIS — E43 Unspecified severe protein-calorie malnutrition: Secondary | ICD-10-CM | POA: Diagnosis present

## 2012-01-16 DIAGNOSIS — J9600 Acute respiratory failure, unspecified whether with hypoxia or hypercapnia: Secondary | ICD-10-CM | POA: Diagnosis not present

## 2012-01-16 DIAGNOSIS — K651 Peritoneal abscess: Secondary | ICD-10-CM | POA: Diagnosis not present

## 2012-01-16 DIAGNOSIS — S31109A Unspecified open wound of abdominal wall, unspecified quadrant without penetration into peritoneal cavity, initial encounter: Secondary | ICD-10-CM | POA: Diagnosis not present

## 2012-01-16 LAB — HEPARIN LEVEL (UNFRACTIONATED): Heparin Unfractionated: 0.26 IU/mL — ABNORMAL LOW (ref 0.30–0.70)

## 2012-01-16 LAB — GLUCOSE, CAPILLARY
Glucose-Capillary: 120 mg/dL — ABNORMAL HIGH (ref 70–99)
Glucose-Capillary: 102 mg/dL — ABNORMAL HIGH (ref 70–99)
Glucose-Capillary: 170 mg/dL — ABNORMAL HIGH (ref 70–99)

## 2012-01-16 LAB — BASIC METABOLIC PANEL
Chloride: 105 mEq/L (ref 96–112)
GFR calc Af Amer: >90 mL/min (ref >90)
GFR calc non Af Amer: >90 mL/min (ref >90)
Potassium: 3.9 mEq/L (ref 3.5–5.1)
Sodium: 143 mEq/L (ref 135–145)

## 2012-01-16 LAB — CBC
HCT: 32.5 % — ABNORMAL LOW (ref 39.0–52.0)
MCHC: 32 g/dL (ref 30.0–36.0)
RDW: 15.5 % (ref 11.5–15.5)
WBC: 7.6 10*3/uL (ref 4.0–10.5)

## 2012-01-16 MED ORDER — NALOXONE HCL 0.4 MG/ML IJ SOLN: 0.4000 mg | INTRAMUSCULAR | Status: DC | PRN

## 2012-01-16 MED ORDER — SODIUM CHLORIDE 0.9 % IJ SOLN
9.0000 mL | INTRAMUSCULAR | Status: DC | PRN
  Administered 2012-01-16: 9 mL via INTRAVENOUS
  Administered 2012-01-16 – 2012-01-19 (×2): 10 mL via INTRAVENOUS

## 2012-01-16 MED ORDER — DIPHENHYDRAMINE HCL 50 MG/ML IJ SOLN: 12.5000 mg | Freq: Four times a day (QID) | INTRAMUSCULAR | Status: DC | PRN

## 2012-01-16 MED ORDER — FAT EMULSION 20 % IV EMUL
240.0000 mL | INTRAVENOUS | Status: AC
  Administered 2012-01-16: 240 mL via INTRAVENOUS
  Filled 2012-01-16: qty 250

## 2012-01-16 MED ORDER — MORPHINE SULFATE (PF) 1 MG/ML IV SOLN
INTRAVENOUS | Status: DC
  Administered 2012-01-16: 4.5 mg via INTRAVENOUS
  Administered 2012-01-16: 1.5 mg via INTRAVENOUS
  Administered 2012-01-16: 6 mg via INTRAVENOUS
  Administered 2012-01-16: 09:00:00 via INTRAVENOUS
  Administered 2012-01-17 (×2): 3 mg via INTRAVENOUS
  Administered 2012-01-17: 1.5 mg via INTRAVENOUS
  Administered 2012-01-17: 11:00:00 via INTRAVENOUS
  Administered 2012-01-18: 4.5 mg via INTRAVENOUS
  Administered 2012-01-18: 1.5 mL via INTRAVENOUS
  Administered 2012-01-18: 3 mL via INTRAVENOUS
  Administered 2012-01-18: 3 mg via INTRAVENOUS
  Administered 2012-01-18 – 2012-01-19 (×4): 1.5 mg via INTRAVENOUS
  Administered 2012-01-19: 4.5 mg via INTRAVENOUS
  Administered 2012-01-19: 1 mg via INTRAVENOUS
  Filled 2012-01-16 (×4): qty 25

## 2012-01-16 MED ORDER — DIGOXIN 0.25 MG/ML IJ SOLN
0.2500 mg | Freq: Every day | INTRAMUSCULAR | Status: DC
  Administered 2012-01-16 – 2012-01-17 (×2): 0.25 mg via INTRAVENOUS
  Filled 2012-01-16 (×3): qty 1

## 2012-01-16 MED ORDER — ONDANSETRON HCL 4 MG/2ML IJ SOLN: 4.0000 mg | Freq: Four times a day (QID) | INTRAMUSCULAR | Status: DC | PRN

## 2012-01-16 MED ORDER — HEPARIN SOD (PORCINE) IN D5W 100 UNIT/ML IV SOLN
1950.0000 [IU]/h | INTRAVENOUS | Status: DC
  Administered 2012-01-16 – 2012-01-18 (×3): 1700 [IU]/h via INTRAVENOUS
  Administered 2012-01-18 – 2012-01-21 (×5): 1850 [IU]/h via INTRAVENOUS
  Filled 2012-01-16 (×14): qty 250

## 2012-01-16 MED ORDER — TRACE MINERALS CR-CU-MN-SE-ZN 10-1000-500-60 MCG/ML IV SOLN
INTRAVENOUS | Status: AC
  Administered 2012-01-16: 18:00:00 via INTRAVENOUS
  Filled 2012-01-16: qty 1000

## 2012-01-16 MED ORDER — DIPHENHYDRAMINE HCL 12.5 MG/5ML PO ELIX
12.5000 mg | ORAL_SOLUTION | Freq: Four times a day (QID) | ORAL | Status: DC | PRN
  Filled 2012-01-16: qty 5

## 2012-01-16 NOTE — Progress Notes (Signed)
[name] notified of small area of the distal portion of [name] wound still bleeding after pressure held X 10 minutes.  Order received to hold heparin and he will see patient.  He cauterized the small area and a new wet to dry dressing was placed.  All bleeding has stopped.  Heparin to be held X 3 hours before resuming. Patient tolerated procedure well.

## 2012-01-16 NOTE — Progress Notes (Signed)
4 Days Post-Op  Subjective: Awake and alert.  Up in the chair.  Objective: Vital signs in last 24 hours: Temp:  [97.3 F (36.3 C)-99.3 F (37.4 C)] 99.3 F (37.4 C) (01/25 0717) Pulse Rate:  [66-114] 87  (01/25 0700) Resp:  [14-26] 24  (01/25 0700) BP: (101-147)/(44-89) 121/58 mmHg (01/25 0700) SpO2:  [93 %-99 %] 93 % (01/25 0700) Weight:  [165 lb 9.1 oz (75.1 kg)] 165 lb 9.1 oz (75.1 kg) (01/25 0628) Last BM Date: 01/05/12  Intake/Output from previous day: 01/24 0701 - 01/25 0700 In: 1124.5 [I.V.:944.5; NG/GT:120; IV Piggyback:60] Out: 1366 [Urine:935; Emesis/NG output:300; Drains:91; Stool:40] Intake/Output this shift:    PE: Abd-sl firm and distended, small amount of stool in colostomy bag but not gas, thin serosanguinous drain output, wound clean.  Lab Results:   Basename 01/16/12 0400 01/15/12 0425  WBC 7.6 10.2  HGB 10.4* 10.4*  HCT 32.5* 32.0*  PLT 164 143*   BMET  Basename 01/16/12 0400 01/15/12 0425  NA 143 142  K 3.9 3.8  CL 105 105  CO2 28 31  GLUCOSE 118* 125*  BUN 18 23  CREATININE 0.73 0.79  CALCIUM 8.4 8.4   PT/INR No results found for this basename: LABPROT:2,INR:2 in the last 72 hours Comprehensive Metabolic Panel:    Component Value Date/Time   NA 143 01/16/2012 0400   K 3.9 01/16/2012 0400   CL 105 01/16/2012 0400   CO2 28 01/16/2012 0400   BUN 18 01/16/2012 0400   CREATININE 0.73 01/16/2012 0400   GLUCOSE 118* 01/16/2012 0400   CALCIUM 8.4 01/16/2012 0400   AST 9 01/11/2012 2340   ALT 11 01/11/2012 2340   ALKPHOS 42 01/11/2012 2340   BILITOT 0.5 01/11/2012 2340   PROT 5.5* 01/11/2012 2340   ALBUMIN 2.4* 01/11/2012 2340     Studies/Results: Dg Chest Port 1 View  01/16/2012  *RADIOLOGY REPORT*  Clinical Data: Atelectasis.  PORTABLE CHEST - 1 VIEW  Comparison: Multiple priors, most recently 01/15/2012.  Findings: Previously noted support apparatus are unchanged.  Lung volumes remain low. There continues to be bibasilar opacities favored to  represent areas of atelectasis.  Minimal blunting of the left costophrenic sulcus suggest a trace left-sided pleural effusion (unchanged).  Vascular crowding related to low lung volumes without frank pulmonary edema.  Moderate enlargement of the cardiopericardial silhouette (unchanged). The patient is rotated to the right on today's exam, resulting in distortion of the mediastinal contours and reduced diagnostic sensitivity and specificity for mediastinal pathology.  Atherosclerosis. Status post median sternotomy for CABG.  IMPRESSION:  1.  No significant interval change in the radiographic appearance chest, as above.  Original Report Authenticated By: Florencia Reasons, M.D.   Dg Chest Port 1 View  01/15/2012  *RADIOLOGY REPORT*  Clinical Data: Respiratory failure.  Follow-up atelectasis.  PORTABLE CHEST - 1 VIEW  Comparison: 01/14/2012  Findings: Endotracheal tube has been removed.  Jugular center venous catheter and nasogastric tube remain in place.  Bibasilar atelectasis is again seen without significant change. Cardiomegaly stable.  Prior CABG again noted.  IMPRESSION: Stable bibasilar atelectasis following extubation.  Original Report Authenticated By: Danae Orleans, M.D.    Anti-infectives: Anti-infectives     Start     Dose/Rate Route Frequency Ordered Stop   01/12/12 2200   ertapenem (INVANZ) 1 g in sodium chloride 0.9 % 50 mL IVPB  Status:  Discontinued        1 g 100 mL/hr over 30 Minutes Intravenous Every 24  hours 01/12/12 2106 01/12/12 2115   01/12/12 1800   ciprofloxacin (CIPRO) IVPB 400 mg  Status:  Discontinued        400 mg 200 mL/hr over 60 Minutes Intravenous Every 12 hours 01/12/12 0102 01/12/12 1018   01/12/12 1400   metroNIDAZOLE (FLAGYL) IVPB 500 mg  Status:  Discontinued        500 mg 100 mL/hr over 60 Minutes Intravenous Every 8 hours 01/12/12 0102 01/12/12 1018   01/12/12 1030   ertapenem (INVANZ) 1 g in sodium chloride 0.9 % 50 mL IVPB        1 g 100 mL/hr over 30  Minutes Intravenous Every 24 hours 01/12/12 1018     01/12/12 0115   ciprofloxacin (CIPRO) IVPB 400 mg        400 mg 200 mL/hr over 60 Minutes Intravenous  Once 01/12/12 0105 01/12/12 0613   01/12/12 0115   metroNIDAZOLE (FLAGYL) IVPB 500 mg        500 mg 100 mL/hr over 60 Minutes Intravenous  Once 01/12/12 0105 01/12/12 0444          Assessment Principal Problem:  *Diverticulitis of large intestine with perforation s/p sigmoid colectomy and colostomy 01/12/12-still with postop ileus Active Problems:  SIRS (systemic inflammatory response syndrome)  Open abdominal wound-healing by secondary intention   LOS: 5 days   Plan: Continue wound care.  Wait for ileus to resolve.   Jerah Esty J 01/16/2012

## 2012-01-16 NOTE — Progress Notes (Signed)
Name: JASANI DOLNEY MRN: [id number] DOB: Apr 10, 1941    LOS: 5  PCCM PROGRESS  NOTE  History of Present Illness:  Admitted to [facility] with COPD exacerbation on 1/14.  Developed abdominal pain.  CT scan on 1/18 demonstrated diverticulitis and free air.  Started on antibiotics.  Transferred to [facility] on 1/20.  Repeat  CT scan demonstrated pneumoperitoneum and developing abscess.  Brought to OR on 1/21 for exploratory laparotomy, drainage of intraabdominal abscess, sigmoid colectomy and colostomy.  Lines / Drains: 1/21  ETT>>1/23 1/21  NGT>> 1/21  Foley>> 1/21  R IJ TLC>> 1/21  JP drain>>   Cultures: None  Antibiotics: 1/18  Cipro>>> 1/21 1/18  Flagyl (abd abscess)>>1/21 1/21  Ertapenem (abd abscess)>>>  Tests / Events: 1/20  Abdominal CT>>>pneumoperitoneum / intraabdominal abscess 1/21  Exploratory laparotomy, drainage of intraabdominal abscess, sigmoid colectomy and colostomy  SUBJ: Improved, alert, productive cough Still with ileus, needs nutrition  Past Medical History  Diagnosis Date  . Atrial fibrillation   . Hypertension   . Hyperkalemia   . Acute systolic heart failure   . Coronary artery disease     Cath 2005- patent LIMA-LAD, SVG-OM grafts, moderate MR  . Prostate cancer    Past Surgical History  Procedure Date  . Bypass graft 1989    LIMA-LAD, SVG-OM  . Mitral valve replacement 2007    Mechanical, performed at [facility]  . Hernia repair   . External ear surgery     drum  . Colon resection 01/12/2012    Procedure: COLON RESECTION;  Surgeon: Adolph Pollack, MD;  Location: [facility] OR;  Service: General;  Laterality: N/A;   Prior to Admission medications   Medication Sig Start Date End Date Taking? Authorizing Provider  albuterol-ipratropium (COMBIVENT) 18-103 MCG/ACT inhaler Inhale 2 puffs into the lungs as needed. For shortness of breath   Yes Historical Provider, MD  aspirin EC 81 MG tablet Take 81 mg by mouth daily.   Yes Historical Provider, MD    budesonide-formoterol (SYMBICORT) 160-4.5 MCG/ACT inhaler Inhale 2 puffs into the lungs 2 (two) times daily.   Yes Historical Provider, MD  carvedilol (COREG) 12.5 MG tablet Take 12.5 mg by mouth 2 (two) times daily with a meal.  07/31/11  Yes Historical Provider, MD  Cholecalciferol (VITAMIN D3) 1000 UNITS CAPS Take 1 capsule by mouth daily.     Yes Historical Provider, MD  digoxin (LANOXIN) 0.125 MG tablet Take 125 mcg by mouth daily.   Yes Historical Provider, MD  furosemide (LASIX) 20 MG tablet Take 20 mg by mouth daily.   Yes Historical Provider, MD  guaiFENesin (MUCINEX) 600 MG 12 hr tablet Take 1,200 mg by mouth at bedtime.   Yes Historical Provider, MD  indomethacin (INDOCIN) 50 MG capsule Take 50 mg by mouth 3 (three) times daily as needed.   Yes Historical Provider, MD  ipratropium-albuterol (DUONEB) 0.5-2.5 (3) MG/3ML SOLN Take 3 mLs by nebulization as needed. For shortness of breath   Yes Historical Provider, MD  losartan (COZAAR) 100 MG tablet Take 100 mg by mouth daily.  07/30/11  Yes Historical Provider, MD  metFORMIN (GLUCOPHAGE) 500 MG tablet Take 500 mg by mouth 2 (two) times daily with a meal.  08/26/11  Yes Historical Provider, MD  nitrofurantoin (MACRODANTIN) 100 MG capsule Take 100 mg by mouth daily.   Yes Historical Provider, MD  OVER THE COUNTER MEDICATION Take 1 tablet by mouth daily. Preservision Eye Vitamins   Yes Historical Provider, MD  simvastatin (ZOCOR)  40 MG tablet Take 40 mg by mouth at bedtime.     Yes Historical Provider, MD  Tamsulosin HCl (FLOMAX) 0.4 MG CAPS Take 0.4 mg by mouth daily.  07/30/11  Yes Historical Provider, MD  warfarin (COUMADIN) 2.5 MG tablet Take 2.5 mg by mouth daily. Take 2.5 mg on Mon, Tues, Thurs,Fri, and Sun. Take 5 mg on Wed and Saturday.   Yes Historical Provider, MD   Allergies Allergies  Allergen Reactions  . Codeine     REACTION: hives  . Sulfonamide Derivatives     REACTION: hives  . Tylenol-Codeine     REACTION: hives   Family  History Family History  Problem Relation Age of Onset  . Coronary artery disease Mother   . Stroke Father   . Heart attack Mother     x10  . Heart attack Brother     with bypass   . Heart attack Sister     with bypass   . Cancer Sister   . Cancer Brother    Social History  reports that he quit smoking about 24 years ago. His smoking use included Cigarettes. He has a 92 pack-year smoking history. He has never used smokeless tobacco. He reports that he does not drink alcohol or use illicit drugs.  Review Of Systems  11 points review of systems is negative with an exception of listed in HPI.  Vital Signs: Temp:  [97.3 F (36.3 C)-99.3 F (37.4 C)] 99.3 F (37.4 C) (01/25 0717) Pulse Rate:  [66-114] 87  (01/25 0700) Resp:  [14-26] 24  (01/25 0700) BP: (101-147)/(44-89) 121/58 mmHg (01/25 0700) SpO2:  [93 %-99 %] 93 % (01/25 0700) Weight:  [75.1 kg (165 lb 9.1 oz)] 75.1 kg (165 lb 9.1 oz) (01/25 0628) I/O last 3 completed shifts: In: 1706.5 [I.V.:1436.5; NG/GT:210; IV Piggyback:60] Out: 2036 [Urine:1370; Emesis/NG output:500; Drains:126; Stool:40]  Physical Examination: General:  Awake and alert, no distress Neuro: awake and alert HEENT:  NGT,  Neck:  No JVD   Cardiovascular: Irregularly irregular rate, no murmurs Lungs:  Bilateral diminished air entry, no wheezing, scattered rhonchi Abdomen:  Viable colostomy, surgically dressing clean / dry / intact Musculoskeletal: No edema Skin:  No rash      Labs and Imaging:  Reviewed.  Please refer to the Assessment and Plan section for relevant results. Dg Chest Port 1 View  01/16/2012  *RADIOLOGY REPORT*  Clinical Data: Atelectasis.  PORTABLE CHEST - 1 VIEW  Comparison: Multiple priors, most recently 01/15/2012.  Findings: Previously noted support apparatus are unchanged.  Lung volumes remain low. There continues to be bibasilar opacities favored to represent areas of atelectasis.  Minimal blunting of the left costophrenic sulcus  suggest a trace left-sided pleural effusion (unchanged).  Vascular crowding related to low lung volumes without frank pulmonary edema.  Moderate enlargement of the cardiopericardial silhouette (unchanged). The patient is rotated to the right on today's exam, resulting in distortion of the mediastinal contours and reduced diagnostic sensitivity and specificity for mediastinal pathology.  Atherosclerosis. Status post median sternotomy for CABG.  IMPRESSION:  1.  No significant interval change in the radiographic appearance chest, as above.  Original Report Authenticated By: Florencia Reasons, M.D.   Dg Chest Port 1 View  01/15/2012  *RADIOLOGY REPORT*  Clinical Data: Respiratory failure.  Follow-up atelectasis.  PORTABLE CHEST - 1 VIEW  Comparison: 01/14/2012  Findings: Endotracheal tube has been removed.  Jugular center venous catheter and nasogastric tube remain in place.  Bibasilar atelectasis is again  seen without significant change. Cardiomegaly stable.  Prior CABG again noted.  IMPRESSION: Stable bibasilar atelectasis following extubation.  Original Report Authenticated By: Danae Orleans, M.D.     Assessment and Plan:  Intraabdominal abscess, s/p eploratory laparotomy, drainage of intraabdominal abscess, sigmoid colectomy and colostomy.  No signs of sepsis.  Lab 01/16/12 0400 01/15/12 0425 01/14/12 0400 01/13/12 0420 01/12/12 0545  WBC 7.6 10.2 11.4* 9.6 8.6   -->  Antibiotics to continue with Invanz monotherapy -->  Surgery following -->PCA for pain control    Post-operative respiratory failure RESOLVED.  History of COPD.  No evidence of acute bronchospasm.  Lab 01/12/12 2143  PHART 7.422  PCO2ART 46.1*  PO2ART 117.0*   -->IS q4h -->flutter valve -->BD therapy -->mobilize with PT/OT    History of CAD s/p CABG.  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  Ischemic cardiomyopathy.  Atrial fibrillation.  No signs of active ischemia.  Atrial fibrillation with controlled rate.   Dyslipidemia. -->  Hold ASA, Coreg, Lqsix, Losartan, Simvastatin -->  Cont Heparin per pharmacy  -> BB per cardiology   Hematemesis is on the record but no active hematemesis noted and no bloody discharge from NG.  This is on the background of Coumadin, ASA and Indocin.  Stable hemoglobin.  Lab 01/16/12 0400 01/15/12 0425 01/14/12 0400 01/13/12 0420 01/12/12 0545  HCT 32.5* 32.0* 33.0* 33.1* 34.2*   -->  Protonix q12h IV  Coumadin induced coagulopathy, s/p FFP / Vit K  Lab 01/13/12 0420 01/12/12 1542 01/12/12 0545 01/11/12 2340  INR 1.36 1.52* 2.53* 2.65*   -->  Heparin per pharmacy as above  Diabetes / hyperglycemia  Lab 01/16/12 0747 01/16/12 0337 01/15/12 2325 01/15/12 1922 01/15/12 1523  GLUCAP 116* 110* 110* 97 126*   -->  SSI HG protocol  Protein Calorie Malnutrition Severe -->Start TPN  Best practices / Disposition -->  ICU status under PCCM -->  Surgery following -->  Full code -->  Full heparin for DVT Px, afib -->  Protonix for GI Px -->TPN for nutrition Shan Levans Beeper  [phone]  Cell  [phone]  If no response or cell goes to voicemail, call beeper [phone]  01/16/2012, 8:20 AM

## 2012-01-16 NOTE — Progress Notes (Signed)
PARENTERAL NUTRITION CONSULT NOTE - INITIAL  Pharmacy Consult for TPN Indication: prolonged ileus s/p sigmoid colectomy/colostomy 1/21  Allergies  Allergen Reactions  . Codeine     REACTION: hives  . Sulfonamide Derivatives     REACTION: hives  . Tylenol-Codeine     REACTION: hives    Patient Measurements: Height: 5' 4.8" (164.6 cm) Weight: 165 lb 9.1 oz (75.1 kg) IBW/kg (Calculated) : 61.04  Adjusted Body Weight: 65 kg   Vital Signs: Temp: 99.3 F (37.4 C) (01/25 0717) Temp src: Oral (01/25 0717) BP: 121/58 mmHg (01/25 0700) Pulse Rate: 87  (01/25 0700) Intake/Output from previous day: 01/24 0701 - 01/25 0700 In: 1124.5 [I.V.:944.5; NG/GT:120; IV Piggyback:60] Out: 1366 [Urine:935; Emesis/NG output:300; Drains:91; Stool:40] Intake/Output from this shift:    Labs:  Basename 01/16/12 0400 01/15/12 0425 01/14/12 0400  WBC 7.6 10.2 11.4*  HGB 10.4* 10.4* 10.7*  HCT 32.5* 32.0* 33.0*  PLT 164 143* 129*  APTT -- -- --  INR -- -- --     Basename 01/16/12 0400 01/15/12 0425 01/14/12 0400  NA 143 142 140  K 3.9 3.8 3.6  CL 105 105 103  CO2 28 31 29   GLUCOSE 118* 125* 130*  BUN 18 23 28*  CREATININE 0.73 0.79 0.94  LABCREA -- -- --  CREAT24HRUR -- -- --  CALCIUM 8.4 8.4 8.1*  MG -- -- --  PHOS -- -- --  PROT -- -- --  ALBUMIN -- -- --  AST -- -- --  ALT -- -- --  ALKPHOS -- -- --  BILITOT -- -- --  BILIDIR -- -- --  IBILI -- -- --  PREALBUMIN -- -- --  TRIG -- -- --  CHOLHDL -- -- --  CHOL -- -- --   Estimated Creatinine Clearance: 78.6 ml/min (by C-G formula based on Cr of 0.73).    Basename 01/16/12 0747 01/16/12 0337 01/15/12 2325  GLUCAP 116* 110* 110*    Medical History: Past Medical History  Diagnosis Date  . Atrial fibrillation   . Hypertension   . Hyperkalemia   . Acute systolic heart failure   . Coronary artery disease     Cath 2005- patent LIMA-LAD, SVG-OM grafts, moderate MR  . Prostate cancer     Medications:    Prescriptions prior to admission  Medication Sig Dispense Refill  . albuterol-ipratropium (COMBIVENT) 18-103 MCG/ACT inhaler Inhale 2 puffs into the lungs as needed. For shortness of breath      . aspirin EC 81 MG tablet Take 81 mg by mouth daily.      . budesonide-formoterol (SYMBICORT) 160-4.5 MCG/ACT inhaler Inhale 2 puffs into the lungs 2 (two) times daily.      . carvedilol (COREG) 12.5 MG tablet Take 12.5 mg by mouth 2 (two) times daily with a meal.       . Cholecalciferol (VITAMIN D3) 1000 UNITS CAPS Take 1 capsule by mouth daily.        . digoxin (LANOXIN) 0.125 MG tablet Take 125 mcg by mouth daily.      . furosemide (LASIX) 20 MG tablet Take 20 mg by mouth daily.      Marland Kitchen guaiFENesin (MUCINEX) 600 MG 12 hr tablet Take 1,200 mg by mouth at bedtime.      . indomethacin (INDOCIN) 50 MG capsule Take 50 mg by mouth 3 (three) times daily as needed.      Marland Kitchen ipratropium-albuterol (DUONEB) 0.5-2.5 (3) MG/3ML SOLN Take 3 mLs by nebulization as needed. For  shortness of breath      . losartan (COZAAR) 100 MG tablet Take 100 mg by mouth daily.       . metFORMIN (GLUCOPHAGE) 500 MG tablet Take 500 mg by mouth 2 (two) times daily with a meal.       . nitrofurantoin (MACRODANTIN) 100 MG capsule Take 100 mg by mouth daily.      Marland Kitchen OVER THE COUNTER MEDICATION Take 1 tablet by mouth daily. Preservision Eye Vitamins      . simvastatin (ZOCOR) 40 MG tablet Take 40 mg by mouth at bedtime.        . Tamsulosin HCl (FLOMAX) 0.4 MG CAPS Take 0.4 mg by mouth daily.       Marland Kitchen warfarin (COUMADIN) 2.5 MG tablet Take 2.5 mg by mouth daily. Take 2.5 mg on Mon, Tues, Thurs,Fri, and Sun. Take 5 mg on Wed and Saturday.        Insulin Requirements in the past 12 hours:  No SSI required  Current Nutrition:  NPO  Nutritional Goals (pending RD assessment):  ~1800 kCal, ~90 grams of protein per day  Assessment: 71 y.o. Male to begin TPN for prolonged ileus s/p sigmoid colectomy/colostomy on 1/21.  Anticoagulation:  Heparin gtt for AFIB/MVR. On coumadin PTA-reversed for emergent surgery.  Infectious Disease: Ertapenem Day #5 for intraabd abscess/perf diverticulum. No cultures pending. Afeb. Wbc wnl.  Cardiovascular: vss. Pt remains in afib per flowsheet. On heparin gtt and IV dig/metoprolol.  Endocrinology: CBGs well controlled with no diet on board. H/o DM - on metformin PTA.   Lytes: WNL at baseline. Would like to keep K ~4 and Mg ~2 with afib and ileus.  Plan:  1. Will begin TPN (Clinimix E 5/15) at 36ml/hr and advance to goal as tolerated. Goal TBD after RD assessment. 2. MVI/TE/Lipids on M/W/F due to national shortage 3. Will add regular insulin 10 units/bag to initial TPN 4. F/u a.m. labs and RD assessment  Christoper Fabian, PharmD, BCPS Pager: [phone] 01/16/2012,9:56 AM

## 2012-01-16 NOTE — Progress Notes (Signed)
Physical Therapy Treatment Patient Details Name: ENDER RORKE MRN: [id number] DOB: 1941/04/17 Today's Date: 01/16/2012  PT Assessment/Plan  PT - Assessment/Plan Comments on Treatment Session: Progress today. Was able to walk. Still very limited by pain and fatigue.  PT Plan: Discharge plan remains appropriate PT Goals  Acute Rehab PT Goals PT Goal: Rolling Supine to Right Side - Progress: Progressing toward goal PT Goal: Rolling Supine to Left Side - Progress: Progressing toward goal PT Goal: Supine/Side to Sit - Progress: Progressing toward goal Pt will Sit at [facility] of Bed: Independently;with no upper extremity support PT Goal: Sit at Edge Of Bed - Progress: Updated due to goal met PT Goal: Sit to Supine/Side - Progress: Progressing toward goal PT Goal: Sit to Stand - Progress: Progressing toward goal PT Goal: Stand to Sit - Progress: Progressing toward goal PT Transfer Goal: Bed to Chair/Chair to Bed - Progress: Progressing toward goal PT Goal: Ambulate - Progress: Progressing toward goal PT Goal: Up/Down Stairs - Progress:  (not addressed today)  PT Treatment Precautions/Restrictions  Precautions Precautions: Fall Restrictions Weight Bearing Restrictions: No Mobility (including Balance) Bed Mobility Rolling Right: 1: +2 Total assist (50%) Sit to Sidelying Left: 1: +2 Total assist Sit to Sidelying Left Details (indicate cue type and reason): facilitation to guide shoulders down to bed as rehab technician assisted with bring legs bed level; pt holding cardiac pillow tightly Scooting to HOB: 1: +2 Total assist (0%) Transfers Sit to Stand: 1: +2 Total assist;From chair/3-in-1 (65%) Sit to Stand Details (indicate cue type and reason): progress today; still needing increased time to come into extended upright position; cues for safe hand placement and assist for follow through Stand to Sit: 3: Mod assist;To chair/3-in-1;To bed;With upper extremity assist (uses one arm to assist in  slowing sit) Stand to Sit Details: assist to guide hips and slow descent Ambulation/Gait Ambulation/Gait: Yes Ambulation/Gait Assistance Details (indicate cue type and reason): amb approx 10 ft with RW and min-modA with cues for upright posture and safe technique/distance with RW; pt very flexed and needed to sit after 10 ft; after 1-2 minute seated rest break pt able to amb 8 ft back to bed with min-modA  Assistive device: Rolling walker Gait Pattern: Trunk flexed;Shuffle  Posture/Postural Control Postural Limitations: flexed for pain Exercise  Total Joint Exercises Ankle Circles/Pumps: AROM;Both;10 reps;Seated End of Session PT - End of Session Equipment Utilized During Treatment: Gait belt Activity Tolerance: Patient tolerated treatment well;Patient limited by pain;Patient limited by fatigue Patient left: in bed;with call bell in reach;with family/visitor present Nurse Communication: Mobility status for transfers;Mobility status for ambulation General Behavior During Session: [facility] for tasks performed Cognition: [facility] for tasks performed  [facility] HELEN 01/16/2012, 12:14 PM

## 2012-01-16 NOTE — Progress Notes (Addendum)
INITIAL ADULT NUTRITION ASSESSMENT Date: 01/16/2012   Time: 11:07 AM  Reason for Assessment: TPN  ASSESSMENT: Male 71 y.o.  Dx: Diverticulitis of large intestine with perforation  Hx:  Past Medical History  Diagnosis Date  . Atrial fibrillation   . Hypertension   . Hyperkalemia   . Acute systolic heart failure   . Coronary artery disease     Cath 2005- patent LIMA-LAD, SVG-OM grafts, moderate MR  . Prostate cancer     Related Meds:     . acyclovir ointment   Topical Q3H  . antiseptic oral rinse  1 application Mouth Rinse QID  . chlorhexidine  15 mL Mouth/Throat BID  . digoxin  0.25 mg Intravenous Daily  . ertapenem  1 g Intravenous Q24H  . insulin aspart  0-20 Units Subcutaneous Q4H  . ipratropium  0.5 mg Nebulization Q6H  . latanoprost  1 drop Both Eyes QHS  . levalbuterol  0.63 mg Nebulization Q6H  . metoprolol  7.5 mg Intravenous Q4H  . morphine   Intravenous Q4H  . pantoprazole (PROTONIX) IV  40 mg Intravenous Q12H  . DISCONTD: digoxin  0.125 mg Intravenous Daily    Ht: 5' 4.8" (164.6 cm)  Wt: 165 lb 9.1 oz (75.1 kg)  Ideal Wt: 59 kg % Ideal Wt: 127%  Usual Wt: 171 lb -- per office visit September 2012 % Usual Wt: 96%  Body mass index is 27.72 kg/(m^2).  Food/Nutrition Related Hx: no nutrition problems per nutrition screen  Labs:  CMP     Component Value Date/Time   NA 143 01/16/2012 0400   K 3.9 01/16/2012 0400   CL 105 01/16/2012 0400   CO2 28 01/16/2012 0400   GLUCOSE 118* 01/16/2012 0400   BUN 18 01/16/2012 0400   CREATININE 0.73 01/16/2012 0400   CALCIUM 8.4 01/16/2012 0400   PROT 5.5* 01/11/2012 2340   ALBUMIN 2.4* 01/11/2012 2340   AST 9 01/11/2012 2340   ALT 11 01/11/2012 2340   ALKPHOS 42 01/11/2012 2340   BILITOT 0.5 01/11/2012 2340   GFRNONAA >90 01/16/2012 0400   GFRAA >90 01/16/2012 0400    I/O last 3 completed shifts: In: 1744 [I.V.:1474; NG/GT:210; IV Piggyback:60] Out: 2036 [Urine:1370; Emesis/NG output:500; Drains:126;  Stool:40] Total I/O In: 190 [I.V.:150; NG/GT:30; IV Piggyback:10] Out: 205 [Urine:175; Drains:30]  Diet Order: NPO  Supplements/Tube Feeding: N/A  IVF:    TPN (CLINIMIX) +/- additives   And   fat emulsion   heparin Last Rate: 1,750 Units/hr (01/16/12 0600)  lactated ringers Last Rate: 25 mL/hr at 01/16/12 0600    Estimated Nutritional Needs:   Kcal: 1900-2100 Protein: 100-110 gm Fluid: 1.9-2.1 L  RD unable to obtain nutrition hx from pt -- s/p exploratory laparotomy, drainage of intra-abdominal abscess, sigmoid colectomy, colostomy 1/21; NGT to LIS; TPN to begin tonight -- to receive Clinimix E 5/15 @ 40 ml/hr.  Lipids (20% IVFE @ 10 ml/hr), multivitamins, and trace elements are provided 3 times weekly (MWF) due to national backorder.  Provides 887 kcal and 48 grams protein daily (based on weekly average).  Meets 47% minimum estimated kcal and 48% minimum estimated protein needs.  NUTRITION DIAGNOSIS: -Inadequate oral intake (NI-2.1).  Status: Ongoing  RELATED TO: altered GI function, ileus  AS EVIDENCE BY: NPO status  MONITORING/EVALUATION(Goals): Goal: TPN to meet >90% of estimated protein needs, maximize energy provision as able during national lipid backorder Monitor: TPN prescription, labs, weight, I/O's  EDUCATION NEEDS: -No education needs identified at this time  INTERVENTION:  TPN per pharmacy  RD to follow for nutrition care plan  Dietitian #: [phone]  DOCUMENTATION CODES Per approved criteria  -Not Applicable    [name] 01/16/2012, 11:07 AM

## 2012-01-16 NOTE — Progress Notes (Signed)
ANTICOAGULATION CONSULT NOTE - Follow Up Consult  Pharmacy Consult for Heparin Indication: atrial fibrillation and MVR  Allergies  Allergen Reactions  . Codeine     REACTION: hives  . Sulfonamide Derivatives     REACTION: hives  . Tylenol-Codeine     REACTION: hives    Patient Measurements: Height: 5' 4.8" (164.6 cm) Weight: 170 lb 10.2 oz (77.4 kg) IBW/kg (Calculated) : 61.04   Vital Signs: Temp: 97.8 F (36.6 C) (01/25 0341) Temp src: Oral (01/25 0341) BP: 143/55 mmHg (01/24 1900) Pulse Rate: 98  (01/25 0224)  Labs:  Basename 01/16/12 0400 01/15/12 0425 01/14/12 0650 01/14/12 0400  HGB 10.4* 10.4* -- --  HCT 32.5* 32.0* -- 33.0*  PLT 164 143* -- 129*  APTT -- -- -- --  LABPROT -- -- -- --  INR -- -- -- --  HEPARINUNFRC 0.26* 0.39 0.36 --  CREATININE 0.73 0.79 -- 0.94  CKTOTAL -- -- -- --  CKMB -- -- -- --  TROPONINI -- -- -- --   Estimated Creatinine Clearance: 79.8 ml/min (by C-G formula based on Cr of 0.73).   Medications:  Scheduled:     . acyclovir ointment   Topical Q3H  . antiseptic oral rinse  1 application Mouth Rinse QID  . chlorhexidine  15 mL Mouth/Throat BID  . digoxin  0.125 mg Intravenous Daily  . ertapenem  1 g Intravenous Q24H  . insulin aspart  0-20 Units Subcutaneous Q4H  . ipratropium  0.5 mg Nebulization Q6H  . latanoprost  1 drop Both Eyes QHS  . levalbuterol  0.63 mg Nebulization Q6H  . metoprolol  7.5 mg Intravenous Q4H  . pantoprazole (PROTONIX) IV  40 mg Intravenous Q12H  . DISCONTD: metoprolol  5 mg Intravenous Q4H   Infusions:     . heparin 1,600 Units/hr (01/16/12 0418)  . lactated ringers 25 mL/hr at 01/15/12 2200    Assessment: 71 yo male patient with h/o atrial fibrillation and MVR receiving heparin gtt for bridge therapy. Heparin level (0.26) is below-goal. No problem with infusion per RN.   Goal of Therapy:  Heparin level =0.3-0.7   Plan:  1. Increase IV heparin infusion to 1750 units/hr.  2. Heparin level  in 8 hours.   Lorre Munroe, PharmD. 01/16/2012,5:08 AM

## 2012-01-16 NOTE — Consult Note (Signed)
Subjective: Denies CP  Breathing is OK. Objective: Filed Vitals:   01/16/12 0628 01/16/12 0700 01/16/12 0717 01/16/12 0833  BP:  121/58    Pulse:  87    Temp:   99.3 F (37.4 C)   TempSrc:   Oral   Resp:  24    Height:      Weight: 165 lb 9.1 oz (75.1 kg)     SpO2:  93%  99%   Weight change: -5 lb 1.1 oz (-2.3 kg)  Intake/Output Summary (Last 24 hours) at 01/16/12 0856 Last data filed at 01/16/12 0600  Gross per 24 hour  Intake 1053.5 ml  Output   1306 ml  Net -252.5 ml    General: Patient is in NAD in chair. Neck:  JVP is normal Heart: Crisp valve sounds.  Irreg rate and rhythm Lungs: Clear to auscultation.  No rales or wheezes. Exemities:  No edema.   Neuro: Grossly intact, nonfocal.   Lab Results: Results for orders placed during the hospital encounter of 01/11/12 (from the past 24 hour(s))  GLUCOSE, CAPILLARY     Status: Abnormal   Collection Time   01/15/12 12:03 PM      Component Value Range   Glucose-Capillary 127 (*) 70 - 99 (mg/dL)   Comment 1 Documented in Chart     Comment 2 Notify RN    GLUCOSE, CAPILLARY     Status: Abnormal   Collection Time   01/15/12  3:23 PM      Component Value Range   Glucose-Capillary 126 (*) 70 - 99 (mg/dL)   Comment 1 Documented in Chart     Comment 2 Notify RN    GLUCOSE, CAPILLARY     Status: Normal   Collection Time   01/15/12  7:22 PM      Component Value Range   Glucose-Capillary 97  70 - 99 (mg/dL)   Comment 1 Documented in Chart     Comment 2 Notify RN    GLUCOSE, CAPILLARY     Status: Abnormal   Collection Time   01/15/12 11:25 PM      Component Value Range   Glucose-Capillary 110 (*) 70 - 99 (mg/dL)  GLUCOSE, CAPILLARY     Status: Abnormal   Collection Time   01/16/12  3:37 AM      Component Value Range   Glucose-Capillary 110 (*) 70 - 99 (mg/dL)  HEPARIN LEVEL (UNFRACTIONATED)     Status: Abnormal   Collection Time   01/16/12  4:00 AM      Component Value Range   Heparin Unfractionated 0.26 (*) 0.30 -  0.70 (IU/mL)  CBC     Status: Abnormal   Collection Time   01/16/12  4:00 AM      Component Value Range   WBC 7.6  4.0 - 10.5 (K/uL)   RBC 3.53 (*) 4.22 - 5.81 (MIL/uL)   Hemoglobin 10.4 (*) 13.0 - 17.0 (g/dL)   HCT 45.4 (*) 09.8 - 52.0 (%)   MCV 92.1  78.0 - 100.0 (fL)   MCH 29.5  26.0 - 34.0 (pg)   MCHC 32.0  30.0 - 36.0 (g/dL)   RDW 11.9  14.7 - 82.9 (%)   Platelets 164  150 - 400 (K/uL)  BASIC METABOLIC PANEL     Status: Abnormal   Collection Time   01/16/12  4:00 AM      Component Value Range   Sodium 143  135 - 145 (mEq/L)   Potassium 3.9  3.5 -  5.1 (mEq/L)   Chloride 105  96 - 112 (mEq/L)   CO2 28  19 - 32 (mEq/L)   Glucose, Bld 118 (*) 70 - 99 (mg/dL)   BUN 18  6 - 23 (mg/dL)   Creatinine, Ser 1.61  0.50 - 1.35 (mg/dL)   Calcium 8.4  8.4 - 09.6 (mg/dL)   GFR calc non Af Amer >90  >90 (mL/min)   GFR calc Af Amer >90  >90 (mL/min)  GLUCOSE, CAPILLARY     Status: Abnormal   Collection Time   01/16/12  7:47 AM      Component Value Range   Glucose-Capillary 116 (*) 70 - 99 (mg/dL)   Comment 1 Documented in Chart     Comment 2 Notify RN      Studies/Results: Dg Chest Port 1 View  01/16/2012  *RADIOLOGY REPORT*  Clinical Data: Atelectasis.  PORTABLE CHEST - 1 VIEW  Comparison: Multiple priors, most recently 01/15/2012.  Findings: Previously noted support apparatus are unchanged.  Lung volumes remain low. There continues to be bibasilar opacities favored to represent areas of atelectasis.  Minimal blunting of the left costophrenic sulcus suggest a trace left-sided pleural effusion (unchanged).  Vascular crowding related to low lung volumes without frank pulmonary edema.  Moderate enlargement of the cardiopericardial silhouette (unchanged). The patient is rotated to the right on today's exam, resulting in distortion of the mediastinal contours and reduced diagnostic sensitivity and specificity for mediastinal pathology.  Atherosclerosis. Status post median sternotomy for CABG.   IMPRESSION:  1.  No significant interval change in the radiographic appearance chest, as above.  Original Report Authenticated By: Florencia Reasons, M.D.   Dg Chest Port 1 View  01/15/2012  *RADIOLOGY REPORT*  Clinical Data: Respiratory failure.  Follow-up atelectasis.  PORTABLE CHEST - 1 VIEW  Comparison: 01/14/2012  Findings: Endotracheal tube has been removed.  Jugular center venous catheter and nasogastric tube remain in place.  Bibasilar atelectasis is again seen without significant change. Cardiomegaly stable.  Prior CABG again noted.  IMPRESSION: Stable bibasilar atelectasis following extubation.  Original Report Authenticated By: Danae Orleans, M.D.    Medications: I have reviewed the patient's current medications.  Atrial fibrillation (05/01/2009)   Assessment: Rates remain a little high.   (100s on average)  Continue b blocker.  Would increase digoxin for now to 0.25 mg while NPO.     Plan:   NSVT:  No recurrence.  Chronic systolic heart failure (05/01/2009)   Assessment: LVEF is 40 to 45% by echo.  Volume status is not bad.  Ins not much greater than output.  Once taking PO would optimize meds with ACEI  Continue b blockade and digoxin  MV prosthesis:  Valve sounds remain crisp.  Continue heparin for now.    LOS: 5 days   Dietrich Pates 01/16/2012, 8:56 AM

## 2012-01-16 NOTE — Progress Notes (Signed)
ANTICOAGULATION CONSULT NOTE - Follow Up Consult  Pharmacy Consult for Heparin Indication: atrial fibrillation and MVR  Allergies  Allergen Reactions  . Codeine     REACTION: hives  . Sulfonamide Derivatives     REACTION: hives  . Tylenol-Codeine     REACTION: hives    Patient Measurements: Height: 5' 4.8" (164.6 cm) Weight: 165 lb 9.1 oz (75.1 kg) IBW/kg (Calculated) : 61.04   Vital Signs: Temp: 98.2 F (36.8 C) (01/25 1116) Temp src: Oral (01/25 1116) BP: 115/55 mmHg (01/25 1300) Pulse Rate: 105  (01/25 1300)  Labs:  Basename 01/16/12 1300 01/16/12 0400 01/15/12 0425 01/14/12 0400  HGB -- 10.4* 10.4* --  HCT -- 32.5* 32.0* 33.0*  PLT -- 164 143* 129*  APTT -- -- -- --  LABPROT -- -- -- --  INR -- -- -- --  HEPARINUNFRC 0.58 0.26* 0.39 --  CREATININE -- 0.73 0.79 0.94  CKTOTAL -- -- -- --  CKMB -- -- -- --  TROPONINI -- -- -- --   Estimated Creatinine Clearance: 78.6 ml/min (by C-G formula based on Cr of 0.73).   Medications:  Scheduled:    . acyclovir ointment   Topical Q3H  . antiseptic oral rinse  1 application Mouth Rinse QID  . chlorhexidine  15 mL Mouth/Throat BID  . digoxin  0.25 mg Intravenous Daily  . ertapenem  1 g Intravenous Q24H  . insulin aspart  0-20 Units Subcutaneous Q4H  . ipratropium  0.5 mg Nebulization Q6H  . latanoprost  1 drop Both Eyes QHS  . levalbuterol  0.63 mg Nebulization Q6H  . metoprolol  7.5 mg Intravenous Q4H  . morphine   Intravenous Q4H  . pantoprazole (PROTONIX) IV  40 mg Intravenous Q12H  . DISCONTD: digoxin  0.125 mg Intravenous Daily   Infusions:    . TPN (CLINIMIX) +/- additives     And  . fat emulsion    . heparin 1,750 Units/hr (01/16/12 0600)  . lactated ringers 25 mL/hr at 01/16/12 0600    Assessment: 71 y/o male patient receiving heparin infusion for h/o afib with AVR. Heparin level therapeutic, no bleeding reported. H/h plt stable.   Goal of Therapy:  Heparin level 0.3-0.7 units/ml   Plan:    Continue heparin gtt at 1750 units/hr and f/u in am.  Verlene Mayer, PharmD, BCPS Pager [phone] 01/16/2012,1:44 PM

## 2012-01-17 ENCOUNTER — Inpatient Hospital Stay (HOSPITAL_COMMUNITY): Payer: Medicare Other

## 2012-01-17 DIAGNOSIS — J9600 Acute respiratory failure, unspecified whether with hypoxia or hypercapnia: Secondary | ICD-10-CM | POA: Diagnosis not present

## 2012-01-17 DIAGNOSIS — K651 Peritoneal abscess: Secondary | ICD-10-CM | POA: Diagnosis not present

## 2012-01-17 DIAGNOSIS — G9340 Encephalopathy, unspecified: Secondary | ICD-10-CM | POA: Diagnosis not present

## 2012-01-17 DIAGNOSIS — J9819 Other pulmonary collapse: Secondary | ICD-10-CM | POA: Diagnosis not present

## 2012-01-17 DIAGNOSIS — K5732 Diverticulitis of large intestine without perforation or abscess without bleeding: Secondary | ICD-10-CM | POA: Diagnosis not present

## 2012-01-17 DIAGNOSIS — I5022 Chronic systolic (congestive) heart failure: Secondary | ICD-10-CM | POA: Diagnosis not present

## 2012-01-17 DIAGNOSIS — J449 Chronic obstructive pulmonary disease, unspecified: Secondary | ICD-10-CM | POA: Diagnosis not present

## 2012-01-17 LAB — COMPREHENSIVE METABOLIC PANEL
ALT: 6 U/L (ref 0–53)
AST: 10 U/L (ref 0–37)
Calcium: 8 mg/dL — ABNORMAL LOW (ref 8.4–10.5)
Sodium: 140 mEq/L (ref 135–145)
Total Protein: 4.8 g/dL — ABNORMAL LOW (ref 6.0–8.3)

## 2012-01-17 LAB — PHOSPHORUS: Phosphorus: 3.1 mg/dL (ref 2.3–4.6)

## 2012-01-17 LAB — CBC
MCH: 29.6 pg (ref 26.0–34.0)
MCHC: 32.5 g/dL (ref 30.0–36.0)
Platelets: 165 10*3/uL (ref 150–400)
RDW: 15.2 % (ref 11.5–15.5)

## 2012-01-17 LAB — GLUCOSE, CAPILLARY
Glucose-Capillary: 156 mg/dL — ABNORMAL HIGH (ref 70–99)
Glucose-Capillary: 164 mg/dL — ABNORMAL HIGH (ref 70–99)
Glucose-Capillary: 194 mg/dL — ABNORMAL HIGH (ref 70–99)

## 2012-01-17 LAB — DIFFERENTIAL
Basophils Absolute: 0 10*3/uL (ref 0.0–0.1)
Eosinophils Absolute: 0.1 10*3/uL (ref 0.0–0.7)
Lymphocytes Relative: 9 % — ABNORMAL LOW (ref 12–46)
Lymphs Abs: 0.7 10*3/uL (ref 0.7–4.0)
Neutrophils Relative %: 81 % — ABNORMAL HIGH (ref 43–77)

## 2012-01-17 LAB — CHOLESTEROL, TOTAL: Cholesterol: 108 mg/dL (ref 0–200)

## 2012-01-17 MED ORDER — MAGNESIUM SULFATE 40 MG/ML IJ SOLN
2.0000 g | Freq: Once | INTRAMUSCULAR | Status: AC
  Administered 2012-01-17: 2 g via INTRAVENOUS
  Filled 2012-01-17: qty 50

## 2012-01-17 MED ORDER — DIGOXIN 0.25 MG/ML IJ SOLN
0.1250 mg | Freq: Every day | INTRAMUSCULAR | Status: DC
  Filled 2012-01-17: qty 0.5

## 2012-01-17 MED ORDER — POTASSIUM CHLORIDE 10 MEQ/50ML IV SOLN
INTRAVENOUS | Status: AC
  Administered 2012-01-17: 10 meq via INTRAVENOUS
  Filled 2012-01-17: qty 50

## 2012-01-17 MED ORDER — CLINIMIX E/DEXTROSE (5/15) 5 % IV SOLN
INTRAVENOUS | Status: AC
  Administered 2012-01-17: 18:00:00 via INTRAVENOUS
  Filled 2012-01-17: qty 2000

## 2012-01-17 MED ORDER — ACETAMINOPHEN 325 MG PO TABS
650.0000 mg | ORAL_TABLET | Freq: Four times a day (QID) | ORAL | Status: DC | PRN
  Administered 2012-01-18: 650 mg via ORAL
  Filled 2012-01-17: qty 2

## 2012-01-17 MED ORDER — INSULIN GLARGINE 100 UNIT/ML ~~LOC~~ SOLN
10.0000 [IU] | Freq: Every day | SUBCUTANEOUS | Status: DC
  Administered 2012-01-17: 10 [IU] via SUBCUTANEOUS
  Filled 2012-01-17: qty 3

## 2012-01-17 MED ORDER — POTASSIUM CHLORIDE 10 MEQ/50ML IV SOLN
10.0000 meq | INTRAVENOUS | Status: AC
  Administered 2012-01-17 (×5): 10 meq via INTRAVENOUS
  Filled 2012-01-17: qty 50
  Filled 2012-01-17: qty 250
  Filled 2012-01-17 (×2): qty 50

## 2012-01-17 NOTE — Progress Notes (Signed)
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 Codeine     REACTION: hives  . Sulfonamide Derivatives     REACTION: hives  . Tylenol-Codeine     REACTION: hives    Patient Measurements: Height: 5' 4.8" (164.6 cm) Weight: 168 lb 6.9 oz (76.4 kg) IBW/kg (Calculated) : 61.04  Adjusted Body Weight: 65 Kg   Vital Signs: Temp: 98.5 F (36.9 C) (01/26 0710) Temp src: Oral (01/26 0710) BP: 117/49 mmHg (01/26 0600) Pulse Rate: 93  (01/26 0600) Intake/Output from previous day: 01/25 0701 - 01/26 0700 In: 1519.5 [I.V.:825.5; NG/GT:30; IV Piggyback:70; TPN:594] Out: 1705 [Urine:1335; Emesis/NG output:200; Drains:170] Intake/Output from this shift:    Labs:  Basename 01/17/12 0500 01/16/12 0400 01/15/12 0425  WBC 7.1 7.6 10.2  HGB 10.1* 10.4* 10.4*  HCT 31.1* 32.5* 32.0*  PLT 165 164 143*  APTT -- -- --  INR -- -- --     Basename 01/17/12 0500 01/16/12 0400 01/15/12 0425  NA 140 143 142  K 3.3* 3.9 3.8  CL 104 105 105  CO2 32 28 31  GLUCOSE 219* 118* 125*  BUN 17 18 23   CREATININE 0.70 0.73 0.79  LABCREA -- -- --  CREAT24HRUR -- -- --  CALCIUM 8.0* 8.4 8.4  MG 1.7 1.9 --  PHOS 3.1 3.3 --  PROT 4.8* -- --  ALBUMIN 1.8* -- --  AST 10 -- --  ALT 6 -- --  ALKPHOS 31* -- --  BILITOT 0.3 -- --  BILIDIR -- -- --  IBILI -- -- --  PREALBUMIN -- -- --  TRIG 176* -- --  CHOLHDL -- -- --  CHOL 108 -- --   Estimated Creatinine Clearance: 79.3 ml/min (by C-G formula based on Cr of 0.7).    Basename 01/17/12 0446 01/17/12 0021 01/16/12 2058  GLUCAP 194* 156* 170*    Insulin Requirements in the past 12 hours:  12 units  Current Nutrition:  NPO Day#2 TPN: provides 48g protein, ~700Kcal and MWF fat supplementation will provide ~500Kcal.  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   GI Prolonged ileus, POD#5. Open abd wound. NGT and abd drain outputs noted. Noted and appreciate RD recs, will tailor TPN to meet recommended goals as best as possible with premixed TPN and in the setting of national lipid, MVI and trace element shortages.  Endo Hx DM, noted home metformin on hold. CBG control poor post-op, target is 80-110 post-op. SSI continues, noted requirement.  Lytes/Renal K low, Mg below target of 2 for ileus. Phos ok, Ca corrects to 9.8-wnl (for low alb). Scr stable, UOP good. Noted electrolyte free TPN administered 1/25.  Hepatobil LFTs WNL, Chol WNL, triglycerides above target of 150, but not high enough to necessitate lipid dose adjustment at this time. Hx HLD. Prealbumin pending.  Pulm/Neuro 3L [place]; on atrovent, levalbuterol  Cardiology On IV lopressor, home dig. BP controlled, HR 90s.  ID Ertapenem#6; afeb, WBC WNL. MRSA PCR neg, no other cx  Px IV PPI BID and IV heparin (hx afib, MVR)     Plan:  1. Increase TPN to 38ml/hr (~75% of goal rate of 24ml/hr- this will provide and avg daily 1620Kcal and 100gm protein/day once at goal). Change to Clinimix E 5/15. 2.  MWF supplementation of lipids (56ml/hr), MVI and trace elements d/t national shortages. 3. Replete K with 5 runs KCL this AM, replete Mg with 2gm IV. 4. Increase insulin in TPN to 25 units/bag starting tonight. 5. Will f/up BMET, Mg and Phos in AM  Hendy Brindle K. Allena Katz, PharmD, BCPS.  Clinical Pharmacist Pager [phone]. 01/17/2012 7:36 AM

## 2012-01-17 NOTE — Progress Notes (Signed)
ANTICOAGULATION CONSULT NOTE - Follow Up Consult  Pharmacy Consult for Heparin Indication: atrial fibrillation and MVR  Allergies  Allergen Reactions  . Codeine     REACTION: hives  . Sulfonamide Derivatives     REACTION: hives  . Tylenol-Codeine     REACTION: hives    Patient Measurements: Height: 5' 4.8" (164.6 cm) Weight: 168 lb 6.9 oz (76.4 kg) IBW/kg (Calculated) : 61.04   Vital Signs: Temp: 98.5 F (36.9 C) (01/26 0710) Temp src: Oral (01/26 0710) BP: 116/48 mmHg (01/26 1100) Pulse Rate: 86  (01/26 1100)  Labs:  Basename 01/17/12 0500 01/16/12 1300 01/16/12 0400 01/15/12 0425  HGB 10.1* -- 10.4* --  HCT 31.1* -- 32.5* 32.0*  PLT 165 -- 164 143*  APTT -- -- -- --  LABPROT -- -- -- --  INR -- -- -- --  HEPARINUNFRC 0.40 0.58 0.26* --  CREATININE 0.70 -- 0.73 0.79  CKTOTAL -- -- -- --  CKMB -- -- -- --  TROPONINI -- -- -- --   Estimated Creatinine Clearance: 79.3 ml/min (by C-G formula based on Cr of 0.7).   Medications:  Scheduled:     . acyclovir ointment   Topical Q3H  . antiseptic oral rinse  1 application Mouth Rinse QID  . chlorhexidine  15 mL Mouth/Throat BID  . digoxin  0.125 mg Intravenous Daily  . ertapenem  1 g Intravenous Q24H  . insulin aspart  0-20 Units Subcutaneous Q4H  . insulin glargine  10 Units Subcutaneous Daily  . ipratropium  0.5 mg Nebulization Q6H  . latanoprost  1 drop Both Eyes QHS  . levalbuterol  0.63 mg Nebulization Q6H  . magnesium sulfate 1 - 4 g bolus IVPB  2 g Intravenous Once  . metoprolol  7.5 mg Intravenous Q4H  . morphine   Intravenous Q4H  . pantoprazole (PROTONIX) IV  40 mg Intravenous Q12H  . potassium chloride  10 mEq Intravenous Q1 Hr x 5  . DISCONTD: digoxin  0.25 mg Intravenous Daily   Infusions:     . TPN (CLINIMIX) +/- additives 40 mL/hr at 01/16/12 1807   And  . fat emulsion 240 mL (01/16/12 1808)  . heparin 1,700 Units/hr (01/17/12 0920)  . lactated ringers 25 mL/hr at 01/16/12 0600  . TPN  (CLINIMIX) +/- additives    . DISCONTD: heparin 1,750 Units/hr (01/16/12 0600)    Assessment: 71 y/o male patient receiving heparin infusion for h/o afib with AVR. Heparin level therapeutic, no bleeding reported. H/h plt stable. Not on coumadin, per MD will resume once able to tolerate PO meds  Goal of Therapy:  Heparin level 0.3-0.7 units/ml   Plan:  1.Continue heparin gtt at 1700units/hr and f/u in am. 2. F/u po intake and plan to resume coumadin  Christell Faith, PharmD Pager [phone] 01/17/2012,11:53 AM

## 2012-01-17 NOTE — Progress Notes (Signed)
Patient ID: [name], male   DOB: July 22, 1941, 71 y.o.   MRN: [id number]    SUBJECTIVE: NG tube to wall suction, patient getting TPN.  Only IV meds.  No complaints this morning.  Awake/alert.      Marland Kitchen acyclovir ointment   Topical Q3H  . antiseptic oral rinse  1 application Mouth Rinse QID  . chlorhexidine  15 mL Mouth/Throat BID  . digoxin  0.125 mg Intravenous Daily  . ertapenem  1 g Intravenous Q24H  . insulin aspart  0-20 Units Subcutaneous Q4H  . insulin glargine  10 Units Subcutaneous Daily  . ipratropium  0.5 mg Nebulization Q6H  . latanoprost  1 drop Both Eyes QHS  . levalbuterol  0.63 mg Nebulization Q6H  . magnesium sulfate 1 - 4 g bolus IVPB  2 g Intravenous Once  . metoprolol  7.5 mg Intravenous Q4H  . morphine   Intravenous Q4H  . pantoprazole (PROTONIX) IV  40 mg Intravenous Q12H  . potassium chloride  10 mEq Intravenous Q1 Hr x 5  . DISCONTD: digoxin  0.25 mg Intravenous Daily      Filed Vitals:   01/17/12 0900 01/17/12 0913 01/17/12 1000 01/17/12 1100  BP: 98/44   116/48  Pulse: 87  100 86  Temp:      TempSrc:      Resp: 23  24 21   Height:      Weight:      SpO2: 96% 97% 99% 96%    Intake/Output Summary (Last 24 hours) at 01/17/12 1145 Last data filed at 01/17/12 1123  Gross per 24 hour  Intake   1742 ml  Output   1830 ml  Net    -88 ml    LABS: Basic Metabolic Panel:  Basename 01/17/12 0500 01/16/12 0400  NA 140 143  K 3.3* 3.9  CL 104 105  CO2 32 28  GLUCOSE 219* 118*  BUN 17 18  CREATININE 0.70 0.73  CALCIUM 8.0* 8.4  MG 1.7 1.9  PHOS 3.1 3.3   Liver Function Tests:  Basename 01/17/12 0500  AST 10  ALT 6  ALKPHOS 31*  BILITOT 0.3  PROT 4.8*  ALBUMIN 1.8*   No results found for this basename: LIPASE:2,AMYLASE:2 in the last 72 hours CBC:  Basename 01/17/12 0500 01/16/12 0400  WBC 7.1 7.6  NEUTROABS 5.7 --  HGB 10.1* 10.4*  HCT 31.1* 32.5*  MCV 91.2 92.1  PLT 165 164   Cardiac Enzymes: No results found for this  basename: CKTOTAL:3,CKMB:3,CKMBINDEX:3,TROPONINI:3 in the last 72 hours BNP: No components found with this basename: POCBNP:3 D-Dimer: No results found for this basename: DDIMER:2 in the last 72 hours Hemoglobin A1C: No results found for this basename: HGBA1C in the last 72 hours Fasting Lipid Panel:  Basename 01/17/12 0500  CHOL 108  HDL --  LDLCALC --  TRIG 176*  CHOLHDL --  LDLDIRECT --   Thyroid Function Tests: No results found for this basename: TSH,T4TOTAL,FREET3,T3FREE,THYROIDAB in the last 72 hours Anemia Panel: No results found for this basename: VITAMINB12,FOLATE,FERRITIN,TIBC,IRON,RETICCTPCT in the last 72 hours   PHYSICAL EXAM General: NAD Neck: JVP difficult, possible mild elevation Lungs: Clear to auscultation anteriorly CV: Nondisplaced PMI.  Heart irregular S1/S2, no S3/S4, no murmur.  No peripheral edema.  Abdomen: Soft, has colostomy. Neurologic: Alert and oriented x 3.  Psych: Normal affect. Extremities: No clubbing or cyanosis.   TELEMETRY: Reviewed telemetry pt in atrial fibrillation with BiV pacing.   ASSESSMENT AND PLAN: 71 yo  with mechanical mitral valve, chronic atrial fibrillation, chronic systolic CHF admitted with diverticulitis and perforation, now s/p colectomy and colostomy.   1. CHF: Chronic systolic. I/Os even.  Will check CVPs.  2. Atrial fibrillation: Rate control with IV metoprolol and IV digoxin (no oral or NGT meds yet).  Will decrease digoxin dose to 0.125 mg IV daily.  Rate is well-controlled.  3. Mechanical MV: On heparin gtt, will resume coumadin when he can take po.   [name] 01/17/2012 11:49 AM

## 2012-01-17 NOTE — Progress Notes (Signed)
5 Days Post-Op  Subjective: Passing stool and gas in bag  Objective: Vital signs in last 24 hours: Temp:  [97.9 F (36.6 C)-98.5 F (36.9 C)] 98.5 F (36.9 C) (01/26 0710) Pulse Rate:  [59-112] 97  (01/26 0700) Resp:  [12-29] 12  (01/26 0800) BP: (91-142)/(40-71) 110/46 mmHg (01/26 0700) SpO2:  [91 %-99 %] 95 % (01/26 0800) Weight:  [168 lb 6.9 oz (76.4 kg)] 168 lb 6.9 oz (76.4 kg) (01/26 0500) Last BM Date: 01/05/12  Intake/Output from previous day: 01/25 0701 - 01/26 0700 In: 1606.5 [I.V.:862.5; NG/GT:30; IV Piggyback:70; TPN:644] Out: 1815 [Urine:1435; Emesis/NG output:200; Drains:180] Intake/Output this shift:    Abdomen: ostomy pink stool in bag wound open and clean  Lab Results:   Basename 01/17/12 0500 01/16/12 0400  WBC 7.1 7.6  HGB 10.1* 10.4*  HCT 31.1* 32.5*  PLT 165 164   BMET  Basename 01/17/12 0500 01/16/12 0400  NA 140 143  K 3.3* 3.9  CL 104 105  CO2 32 28  GLUCOSE 219* 118*  BUN 17 18  CREATININE 0.70 0.73  CALCIUM 8.0* 8.4   PT/INR No results found for this basename: LABPROT:2,INR:2 in the last 72 hours ABG No results found for this basename: PHART:2,PCO2:2,PO2:2,HCO3:2 in the last 72 hours  Studies/Results: Dg Chest Port 1 View  01/17/2012  *RADIOLOGY REPORT*  Clinical Data: Postop, COPD, atelectasis  PORTABLE CHEST - 1 VIEW  Comparison: 01/16/2012; 01/15/2012; 01/14/2012  Findings:  Grossly unchanged enlarged cardiac silhouette and mediastinal contours post median sternotomy and CABG.  Stable position of support apparatus.  No pneumothorax.  Lung volumes remain reduced. There is persistent mild elevation of the right hemidiaphragm. Grossly unchanged bibasilar heterogeneous opacities.  No new focal airspace opacities.  No definite pleural effusion.  Unchanged bones.  IMPRESSION: 1.  Grossly unchanged bibasilar opacities favored to represent atelectasis. 2.   Stable position of support apparatus.  Original Report Authenticated By: Waynard Reeds, M.D.   Dg Chest Port 1 View  01/16/2012  *RADIOLOGY REPORT*  Clinical Data: Atelectasis.  PORTABLE CHEST - 1 VIEW  Comparison: Multiple priors, most recently 01/15/2012.  Findings: Previously noted support apparatus are unchanged.  Lung volumes remain low. There continues to be bibasilar opacities favored to represent areas of atelectasis.  Minimal blunting of the left costophrenic sulcus suggest a trace left-sided pleural effusion (unchanged).  Vascular crowding related to low lung volumes without frank pulmonary edema.  Moderate enlargement of the cardiopericardial silhouette (unchanged). The patient is rotated to the right on today's exam, resulting in distortion of the mediastinal contours and reduced diagnostic sensitivity and specificity for mediastinal pathology.  Atherosclerosis. Status post median sternotomy for CABG.  IMPRESSION:  1.  No significant interval change in the radiographic appearance chest, as above.  Original Report Authenticated By: Florencia Reasons, M.D.    Anti-infectives: Anti-infectives     Start     Dose/Rate Route Frequency Ordered Stop   01/12/12 2200   ertapenem (INVANZ) 1 g in sodium chloride 0.9 % 50 mL IVPB  Status:  Discontinued        1 g 100 mL/hr over 30 Minutes Intravenous Every 24 hours 01/12/12 2106 01/12/12 2115   01/12/12 1800   ciprofloxacin (CIPRO) IVPB 400 mg  Status:  Discontinued        400 mg 200 mL/hr over 60 Minutes Intravenous Every 12 hours 01/12/12 0102 01/12/12 1018   01/12/12 1400   metroNIDAZOLE (FLAGYL) IVPB 500 mg  Status:  Discontinued  500 mg 100 mL/hr over 60 Minutes Intravenous Every 8 hours 01/12/12 0102 01/12/12 1018   01/12/12 1030   ertapenem (INVANZ) 1 g in sodium chloride 0.9 % 50 mL IVPB        1 g 100 mL/hr over 30 Minutes Intravenous Every 24 hours 01/12/12 1018     01/12/12 0115   ciprofloxacin (CIPRO) IVPB 400 mg        400 mg 200 mL/hr over 60 Minutes Intravenous  Once 01/12/12 0105 01/12/12 0613    01/12/12 0115   metroNIDAZOLE (FLAGYL) IVPB 500 mg        500 mg 100 mL/hr over 60 Minutes Intravenous  Once 01/12/12 0105 01/12/12 0444          Assessment/Plan: s/p Procedure(s): COLON RESECTION Ileus resolving  If NGT output down will D/C in AM and start clears  LOS: 6 days    Benedetto Ryder A. 01/17/2012

## 2012-01-17 NOTE — Progress Notes (Signed)
Name: [name] MRN: [id number] DOB: Dec 18, 1941    LOS: 6  PCCM PROGRESS  NOTE  History of Present Illness:  Admitted to [facility] with COPD exacerbation on 1/14.  Developed abdominal pain.  CT scan on 1/18 demonstrated diverticulitis and free air.  Started on antibiotics.  Transferred to [facility] on 1/20.  Repeat  CT scan demonstrated pneumoperitoneum and developing abscess.  Brought to OR on 1/21 for exploratory laparotomy, drainage of intraabdominal abscess, sigmoid colectomy and colostomy.  Lines / Drains: 1/21  ETT>>1/23 1/21  NGT>> 1/21  Foley>> 1/21  R IJ TLC>> 1/21  JP drain>>   Cultures: None  Antibiotics: 1/18  Cipro>>> 1/21 1/18  Flagyl (abd abscess)>>1/21 1/21  Ertapenem (abd abscess)>>>  Tests / Events: 1/20  Abdominal CT>>>pneumoperitoneum / intraabdominal abscess 1/21  Exploratory laparotomy, drainage of intraabdominal abscess, sigmoid colectomy and colostomy  SUBJ: Improved, alert, productive cough, oob C/o abd pain  Past Medical History  Diagnosis Date  . Atrial fibrillation   . Hypertension   . Hyperkalemia   . Acute systolic heart failure   . Coronary artery disease     Cath 2005- patent LIMA-LAD, SVG-OM grafts, moderate MR  . Prostate cancer     Vital Signs: Temp:  [97.9 F (36.6 C)-98.5 F (36.9 C)] 98.5 F (36.9 C) (01/26 0710) Pulse Rate:  [74-112] 97  (01/26 0700) Resp:  [12-29] 12  (01/26 0800) BP: (95-142)/(40-71) 110/46 mmHg (01/26 0700) SpO2:  [91 %-99 %] 97 % (01/26 0913) Weight:  [76.4 kg (168 lb 6.9 oz)] 76.4 kg (168 lb 6.9 oz) (01/26 0500) I/O last 3 completed shifts: In: 2186.5 [I.V.:1352.5; NG/GT:120; IV Piggyback:70] Out: 2576 [Urine:1960; Emesis/NG output:350; Drains:226; Stool:40]  Physical Examination: General:  Awake and alert, no distress Neuro: awake and alert, interactive, non focal HEENT:  NGT,  Neck:  No JVD   Cardiovascular: Irregularly irregular rate, no murmurs Lungs:  Bilateral diminished air entry,  no wheezing, scattered rhonchi Abdomen:  Viable colostomy, surgically dressing clean / dry / intact Musculoskeletal: No edema Skin:  No rash      Labs and Imaging:  Reviewed.  Please refer to the Assessment and Plan section for relevant results. Dg Chest Port 1 View  01/17/2012    PORTABLE CHEST - 1 VIEW  Comparison: 01/16/2012; 01/15/2012; 01/14/2012   IMPRESSION: 1.  Grossly unchanged bibasilar opacities favored to represent atelectasis. 2.   Stable position of support apparatus.    Assessment and Plan:  Intraabdominal abscess, s/p eploratory laparotomy, drainage of intraabdominal abscess, sigmoid colectomy and colostomy.  No signs of sepsis.  Lab 01/17/12 0500 01/16/12 0400 01/15/12 0425 01/14/12 0400 01/13/12 0420  WBC 7.1 7.6 10.2 11.4* 9.6   -->  Antibiotics to continue with Invanz monotherapy -->  Surgery following -->PCA for pain control --> Foley can come out once mobile, self cath at home due to prostate issues    Post-operative respiratory failure RESOLVED.  History of COPD.  No evidence of acute bronchospasm.  Lab 01/12/12 2143  PHART 7.422  PCO2ART 46.1*  PO2ART 117.0*   -->IS q4h -->flutter valve -->BD therapy, resume symbicort at some point -->mobilize with PT/OT    History of CAD s/p CABG.  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  Ischemic cardiomyopathy.  Atrial fibrillation.  No signs of active ischemia.  Atrial fibrillation with controlled rate.  Dyslipidemia. -->  Hold ASA, Coreg, Lasix, Losartan, Simvastatin -->  Cont Heparin per pharmacy  -> BB per cardiology   Hematemesis is on  the record but no active hematemesis noted and no bloody discharge from NG.  This is on the background of Coumadin, ASA and Indocin.  Stable hemoglobin.  Lab 01/17/12 0500 01/16/12 0400 01/15/12 0425 01/14/12 0400 01/13/12 0420  HCT 31.1* 32.5* 32.0* 33.0* 33.1*   -->  Protonix q12h IV  Coumadin induced coagulopathy, s/p FFP / Vit K  Lab 01/13/12 0420  01/12/12 1542 01/12/12 0545 01/11/12 2340  INR 1.36 1.52* 2.53* 2.65*   -->  Heparin per pharmacy as above  Diabetes / hyperglycemia  Lab 01/17/12 0708 01/17/12 0446 01/17/12 0021 01/16/12 2058 01/16/12 1606  GLUCAP 193* 194* 156* 170* 102*   -->  SSI HG protocol Add lantus 10  Protein Calorie Malnutrition Severe -->On TPN, hope to start PO in 24 h, CVL to stay in while on TPN  Best practices / Disposition -->  SDU status under PCCM -->  Surgery following -->  Full code -->  Full heparin for DVT Px, afib -->  Protonix for GI Px -->TPN for nutrition  Updated wife  Floyd [facility].  call beeper [phone]  01/17/2012, 9:27 AM

## 2012-01-18 DIAGNOSIS — I5022 Chronic systolic (congestive) heart failure: Secondary | ICD-10-CM | POA: Diagnosis not present

## 2012-01-18 DIAGNOSIS — K5732 Diverticulitis of large intestine without perforation or abscess without bleeding: Secondary | ICD-10-CM | POA: Diagnosis not present

## 2012-01-18 DIAGNOSIS — K651 Peritoneal abscess: Secondary | ICD-10-CM | POA: Diagnosis not present

## 2012-01-18 DIAGNOSIS — J9600 Acute respiratory failure, unspecified whether with hypoxia or hypercapnia: Secondary | ICD-10-CM | POA: Diagnosis not present

## 2012-01-18 DIAGNOSIS — I4891 Unspecified atrial fibrillation: Secondary | ICD-10-CM

## 2012-01-18 DIAGNOSIS — J449 Chronic obstructive pulmonary disease, unspecified: Secondary | ICD-10-CM

## 2012-01-18 LAB — BASIC METABOLIC PANEL
CO2: 27 mEq/L (ref 19–32)
Chloride: 104 mEq/L (ref 96–112)
Creatinine, Ser: 0.69 mg/dL (ref 0.50–1.35)
Potassium: 4 mEq/L (ref 3.5–5.1)

## 2012-01-18 LAB — CBC
MCV: 90.1 fL (ref 78.0–100.0)
Platelets: 173 10*3/uL (ref 150–400)
RDW: 15.2 % (ref 11.5–15.5)
WBC: 7.5 10*3/uL (ref 4.0–10.5)

## 2012-01-18 LAB — PHOSPHORUS: Phosphorus: 3.2 mg/dL (ref 2.3–4.6)

## 2012-01-18 LAB — HEPARIN LEVEL (UNFRACTIONATED): Heparin Unfractionated: 0.27 IU/mL — ABNORMAL LOW (ref 0.30–0.70)

## 2012-01-18 LAB — GLUCOSE, CAPILLARY
Glucose-Capillary: 160 mg/dL — ABNORMAL HIGH (ref 70–99)
Glucose-Capillary: 174 mg/dL — ABNORMAL HIGH (ref 70–99)

## 2012-01-18 MED ORDER — LOSARTAN POTASSIUM 50 MG PO TABS
50.0000 mg | ORAL_TABLET | Freq: Every day | ORAL | Status: DC
  Administered 2012-01-18 – 2012-01-19 (×2): 50 mg via ORAL
  Filled 2012-01-18 (×2): qty 1

## 2012-01-18 MED ORDER — DIGOXIN 125 MCG PO TABS
0.1250 mg | ORAL_TABLET | Freq: Once | ORAL | Status: AC
  Administered 2012-01-18: 0.125 mg via ORAL

## 2012-01-18 MED ORDER — FUROSEMIDE 10 MG/ML IJ SOLN
40.0000 mg | Freq: Once | INTRAMUSCULAR | Status: AC
  Administered 2012-01-18: 40 mg via INTRAVENOUS
  Filled 2012-01-18: qty 4

## 2012-01-18 MED ORDER — DIGOXIN 125 MCG PO TABS
0.1250 mg | ORAL_TABLET | Freq: Every day | ORAL | Status: DC
  Administered 2012-01-19 – 2012-01-27 (×8): 0.125 mg via ORAL
  Filled 2012-01-18 (×11): qty 1

## 2012-01-18 MED ORDER — CARVEDILOL 12.5 MG PO TABS
12.5000 mg | ORAL_TABLET | Freq: Two times a day (BID) | ORAL | Status: DC
  Administered 2012-01-18 – 2012-01-19 (×2): 12.5 mg via ORAL
  Filled 2012-01-18 (×4): qty 1

## 2012-01-18 MED ORDER — WARFARIN SODIUM 5 MG PO TABS
5.0000 mg | ORAL_TABLET | Freq: Once | ORAL | Status: AC
  Administered 2012-01-18: 5 mg via ORAL
  Filled 2012-01-18: qty 1

## 2012-01-18 MED ORDER — INSULIN REGULAR HUMAN 100 UNIT/ML IJ SOLN
INTRAVENOUS | Status: AC
  Administered 2012-01-18: 18:00:00 via INTRAVENOUS
  Filled 2012-01-18: qty 2000

## 2012-01-18 MED ORDER — INSULIN GLARGINE 100 UNIT/ML ~~LOC~~ SOLN
15.0000 [IU] | Freq: Every day | SUBCUTANEOUS | Status: DC
  Administered 2012-01-18 – 2012-01-27 (×10): 15 [IU] via SUBCUTANEOUS

## 2012-01-18 NOTE — Progress Notes (Signed)
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 Codeine     REACTION: hives  . Sulfonamide Derivatives     REACTION: hives  . Tylenol-Codeine     REACTION: hives    Patient Measurements: Height: 5' 4.8" (164.6 cm) Weight: 169 lb 12.1 oz (77 kg) IBW/kg (Calculated) : 61.04  Adjusted Body Weight: 65 Kg   Vital Signs: Temp: 98.8 F (37.1 C) (01/27 0400) Temp src: Oral (01/27 0400) BP: 117/64 mmHg (01/27 0400) Pulse Rate: 94  (01/27 0400) Intake/Output from previous day: 01/26 0701 - 01/27 0700 In: 2523.5 [I.V.:798.5; IV Piggyback:350; TPN:1375] Out: 1185 [Urine:1070; Drains:115] Intake/Output from this shift:    Labs:  Basename 01/18/12 0453 01/17/12 0500 01/16/12 0400  WBC 7.5 7.1 7.6  HGB 10.0* 10.1* 10.4*  HCT 30.8* 31.1* 32.5*  PLT 173 165 164  APTT -- -- --  INR -- -- --     Basename 01/18/12 0453 01/17/12 0500 01/16/12 0400  NA 138 140 143  K 4.0 3.3* 3.9  CL 104 104 105  CO2 27 32 28  GLUCOSE 207* 219* 118*  BUN 17 17 18   CREATININE 0.69 0.70 0.73  LABCREA -- -- --  CREAT24HRUR -- -- --  CALCIUM 8.1* 8.0* 8.4  MG 1.9 1.7 1.9  PHOS 3.2 3.1 3.3  PROT -- 4.8* --  ALBUMIN -- 1.8* --  AST -- 10 --  ALT -- 6 --  ALKPHOS -- 31* --  BILITOT -- 0.3 --  BILIDIR -- -- --  IBILI -- -- --  PREALBUMIN -- 6.9* --  TRIG -- 176* --  CHOLHDL -- -- --  CHOL -- 108 --   Estimated Creatinine Clearance: 79.6 ml/min (by C-G formula based on Cr of 0.69).    Basename 01/18/12 0429 01/18/12 0011 01/17/12 2025  GLUCAP 218* 174* 164*    Insulin Requirements in the past 12 hours:  15 units SSI, Lantus 10 units, TPN bag contains 25 units regular insulin  Current Nutrition:  NPO Day#3 TPN  Nutritional Goals per RD:  1900-2100 kCal, 100-110 grams of protein per day; Fluid: 1.9-2.1 L/d  Assessment: Diverticulitis of large intestine  with perforation, s/p sig colectomy and colostomy 1/21.   GI Prolonged ileus, POD#6. NGT output nil over past 24h. Noted plans to d/c NGT today and possibly start clears. Noted and appreciate RD recs, will tailor TPN to meet recommended goals as best as possible with premixed TPN and in the setting of national lipid, MVI and trace element shortages.  Endo Hx DM, noted home metformin on hold. CBG control poor post-op, target is 80-110 post-op. SSI continues, noted Lantus added 1/26; TPN insulin increased as well.  Lytes/Renal Lytes WNL. Phos ok, Ca corrects to 9.8-wnl (for low alb). Scr stable, UOP good.   Hepatobil LFTs WNL, Chol WNL, triglycerides above target of 150, but not high enough to necessitate lipid dose adjustment at this time. Hx HLD. Prealbumin low, as expected post surgically and with ongoing, prolonged inflammation and NPO status. Suspect slow recovery.  Pulm/Neuro 3L [place]; on atrovent, levalbuterol  Cardiology On IV lopressor, home dig. BP controlled, HR 90s.  ID Ertapenem#7; afeb, WBC WNL. MRSA PCR neg, no other cx  Px IV PPI BID and IV heparin (hx afib, MVR)     Plan:  1. Continue Clinimix E 5/15 at 12ml/hr- will advance when glycemic control better (This is ~75%  of goal rate of 23ml/hr- this will provide and avg daily 1620Kcal and 100gm protein/day once at goal).  2. MWF supplementation of lipids (46ml/hr), MVI and trace elements d/t national shortages. 3. Increase insulin in TPN to 35 units/bag starting tonight, increase Lantus to 15 units starting this AM. 4. Will f/up AM labs  Hally Colella K. Allena Katz, PharmD, BCPS.  Clinical Pharmacist Pager [phone]. 01/18/2012 7:33 AM

## 2012-01-18 NOTE — Progress Notes (Addendum)
ANTICOAGULATION CONSULT NOTE - Initial Consult  Pharmacy Consult for Coumadin Indication: MVR  Allergies  Allergen Reactions  . Codeine     REACTION: hives  . Sulfonamide Derivatives     REACTION: hives  . Tylenol-Codeine     REACTION: hives    Patient Measurements: Height: 5' 4.8" (164.6 cm) Weight: 169 lb 12.1 oz (77 kg) IBW/kg (Calculated) : 61.04    Vital Signs: Temp: 99 F (37.2 C) (01/27 0800) Temp src: Oral (01/27 0400) BP: 123/54 mmHg (01/27 0800) Pulse Rate: 125  (01/27 0800)  Labs:  Basename 01/18/12 0453 01/17/12 0500 01/16/12 1300 01/16/12 0400  HGB 10.0* 10.1* -- --  HCT 30.8* 31.1* -- 32.5*  PLT 173 165 -- 164  APTT -- -- -- --  LABPROT -- -- -- --  INR -- -- -- --  HEPARINUNFRC 0.27* 0.40 0.58 --  CREATININE 0.69 0.70 -- 0.73  CKTOTAL -- -- -- --  CKMB -- -- -- --  TROPONINI -- -- -- --   Estimated Creatinine Clearance: 79.6 ml/min (by C-G formula based on Cr of 0.69).  Medical History: Past Medical History  Diagnosis Date  . Atrial fibrillation   . Hypertension   . Hyperkalemia   . Acute systolic heart failure   . Coronary artery disease     Cath 2005- patent LIMA-LAD, SVG-OM grafts, moderate MR  . Prostate cancer     Medications:  Scheduled:    . acyclovir ointment   Topical Q3H  . antiseptic oral rinse  1 application Mouth Rinse QID  . carvedilol  12.5 mg Oral BID WC  . chlorhexidine  15 mL Mouth/Throat BID  . digoxin  0.125 mg Oral Daily  . ertapenem  1 g Intravenous Q24H  . furosemide  40 mg Intravenous Once  . insulin aspart  0-20 Units Subcutaneous Q4H  . insulin glargine  15 Units Subcutaneous Daily  . ipratropium  0.5 mg Nebulization Q6H  . latanoprost  1 drop Both Eyes QHS  . levalbuterol  0.63 mg Nebulization Q6H  . losartan  50 mg Oral Daily  . morphine   Intravenous Q4H  . pantoprazole (PROTONIX) IV  40 mg Intravenous Q12H  . potassium chloride  10 mEq Intravenous Q1 Hr x 5  . warfarin  5 mg Oral ONCE-1800  .  DISCONTD: digoxin  0.125 mg Intravenous Daily  . DISCONTD: digoxin  0.25 mg Intravenous Daily  . DISCONTD: insulin glargine  10 Units Subcutaneous Daily  . DISCONTD: metoprolol  7.5 mg Intravenous Q4H   Infusions:    . TPN (CLINIMIX) +/- additives 40 mL/hr at 01/16/12 1807   And  . fat emulsion 240 mL (01/16/12 1808)  . heparin 1,850 Units/hr (01/18/12 0747)  . lactated ringers 25 mL/hr at 01/16/12 0600  . TPN (CLINIMIX) +/- additives 65 mL/hr at 01/17/12 1753  . TPN (CLINIMIX) +/- additives     Anti-infectives     Start     Dose/Rate Route Frequency Ordered Stop   01/12/12 2200   ertapenem (INVANZ) 1 g in sodium chloride 0.9 % 50 mL IVPB  Status:  Discontinued        1 g 100 mL/hr over 30 Minutes Intravenous Every 24 hours 01/12/12 2106 01/12/12 2115   01/12/12 1800   ciprofloxacin (CIPRO) IVPB 400 mg  Status:  Discontinued        400 mg 200 mL/hr over 60 Minutes Intravenous Every 12 hours 01/12/12 0102 01/12/12 1018   01/12/12 1400   metroNIDAZOLE (  FLAGYL) IVPB 500 mg  Status:  Discontinued        500 mg 100 mL/hr over 60 Minutes Intravenous Every 8 hours 01/12/12 0102 01/12/12 1018   01/12/12 1030   ertapenem (INVANZ) 1 g in sodium chloride 0.9 % 50 mL IVPB        1 g 100 mL/hr over 30 Minutes Intravenous Every 24 hours 01/12/12 1018     01/12/12 0115   ciprofloxacin (CIPRO) IVPB 400 mg        400 mg 200 mL/hr over 60 Minutes Intravenous  Once 01/12/12 0105 01/12/12 0613   01/12/12 0115   metroNIDAZOLE (FLAGYL) IVPB 500 mg        500 mg 100 mL/hr over 60 Minutes Intravenous  Once 01/12/12 0105 01/12/12 0444          Assessment: 70 w/ h/x of afib and MVR on heparin gtt for bridge therapy. Patient to start on coumadin today. Home dose of coumadin is 2.5 mg daily except for 5 mg on Wednesday and Saturday. Will start with 5mg  today to get INR back up to goal. INR on 1/22 = 1.36. CBC stable. No bleeding noted. Heparin level subtherapeutic but approaching goal.  Goal of  Therapy:  INR: 2.5-3.5   Plan:  1) Coumadin 5 mg po x 1 dose tonight at 1800 2) Daily PT/INR 3) DC heparin gtt once INR therapeutic (>2.5)  Janace Litten, PharmD [phone] 01/18/2012,11:38 AM

## 2012-01-18 NOTE — Progress Notes (Signed)
ANTICOAGULATION CONSULT NOTE - Follow Up Consult  Pharmacy Consult for Heparin Indication: atrial fibrillation and MVR  Allergies  Allergen Reactions  . Codeine     REACTION: hives  . Sulfonamide Derivatives     REACTION: hives  . Tylenol-Codeine     REACTION: hives    Patient Measurements: Height: 5' 4.8" (164.6 cm) Weight: 169 lb 12.1 oz (77 kg) IBW/kg (Calculated) : 61.04   Vital Signs: Temp: 98.8 F (37.1 C) (01/27 0400) Temp src: Oral (01/27 0400) BP: 117/64 mmHg (01/27 0400) Pulse Rate: 94  (01/27 0400)  Labs:  Basename 01/18/12 0453 01/17/12 0500 01/16/12 1300 01/16/12 0400  HGB 10.0* 10.1* -- --  HCT 30.8* 31.1* -- 32.5*  PLT 173 165 -- 164  APTT -- -- -- --  LABPROT -- -- -- --  INR -- -- -- --  HEPARINUNFRC 0.27* 0.40 0.58 --  CREATININE 0.69 0.70 -- 0.73  CKTOTAL -- -- -- --  CKMB -- -- -- --  TROPONINI -- -- -- --   Estimated Creatinine Clearance: 79.6 ml/min (by C-G formula based on Cr of 0.69).   Medications:  Scheduled:     . acyclovir ointment   Topical Q3H  . antiseptic oral rinse  1 application Mouth Rinse QID  . chlorhexidine  15 mL Mouth/Throat BID  . digoxin  0.125 mg Intravenous Daily  . ertapenem  1 g Intravenous Q24H  . insulin aspart  0-20 Units Subcutaneous Q4H  . insulin glargine  10 Units Subcutaneous Daily  . ipratropium  0.5 mg Nebulization Q6H  . latanoprost  1 drop Both Eyes QHS  . levalbuterol  0.63 mg Nebulization Q6H  . magnesium sulfate 1 - 4 g bolus IVPB  2 g Intravenous Once  . metoprolol  7.5 mg Intravenous Q4H  . morphine   Intravenous Q4H  . pantoprazole (PROTONIX) IV  40 mg Intravenous Q12H  . potassium chloride  10 mEq Intravenous Q1 Hr x 5  . DISCONTD: digoxin  0.25 mg Intravenous Daily   Infusions:     . TPN (CLINIMIX) +/- additives 40 mL/hr at 01/16/12 1807   And  . fat emulsion 240 mL (01/16/12 1808)  . heparin 1,700 Units/hr (01/18/12 4098)  . lactated ringers 25 mL/hr at 01/16/12 0600  . TPN  (CLINIMIX) +/- additives 65 mL/hr at 01/17/12 1753    Assessment: 71 y/o male patient receiving heparin infusion for h/o afib with AVR. Heparin level subtherapeutic this am, no bleeding reported. H/H plt stable. Not on coumadin, per MD will resume once able to tolerate PO meds.  Goal of Therapy:  Heparin level 0.3-0.7 units/ml   Plan:  1. Increase heparin gtt to 1850units/hr (18.5 ml/hr) and f/u in am. 2. F/u po intake and plan to resume coumadin  Christell Faith, PharmD Pager [phone] 01/18/2012,7:39 AM

## 2012-01-18 NOTE — Progress Notes (Signed)
Patient ID: [name], male   DOB: 1941/08/08, 71 y.o.   MRN: [id number]     SUBJECTIVE:  NG tube out.  He is now taking po.  Denies dyspnea or significant abdominal pain.  Mildly confused this morning.   CVP 9-10     . acyclovir ointment   Topical Q3H  . antiseptic oral rinse  1 application Mouth Rinse QID  . carvedilol  12.5 mg Oral BID WC  . chlorhexidine  15 mL Mouth/Throat BID  . digoxin  0.125 mg Oral Daily  . ertapenem  1 g Intravenous Q24H  . furosemide  40 mg Intravenous Once  . insulin aspart  0-20 Units Subcutaneous Q4H  . insulin glargine  15 Units Subcutaneous Daily  . ipratropium  0.5 mg Nebulization Q6H  . latanoprost  1 drop Both Eyes QHS  . levalbuterol  0.63 mg Nebulization Q6H  . losartan  50 mg Oral Daily  . morphine   Intravenous Q4H  . pantoprazole (PROTONIX) IV  40 mg Intravenous Q12H  . potassium chloride  10 mEq Intravenous Q1 Hr x 5  . DISCONTD: digoxin  0.125 mg Intravenous Daily  . DISCONTD: digoxin  0.25 mg Intravenous Daily  . DISCONTD: insulin glargine  10 Units Subcutaneous Daily  . DISCONTD: metoprolol  7.5 mg Intravenous Q4H      Filed Vitals:   01/18/12 0400 01/18/12 0500 01/18/12 0720 01/18/12 0800  BP: 117/64   123/54  Pulse: 94   125  Temp: 98.8 F (37.1 C)   99 F (37.2 C)  TempSrc: Oral     Resp: 22   26  Height:      Weight:  77 kg (169 lb 12.1 oz)    SpO2: 91%  97% 96%    Intake/Output Summary (Last 24 hours) at 01/18/12 1122 Last data filed at 01/18/12 0910  Gross per 24 hour  Intake   2368 ml  Output   1315 ml  Net   1053 ml    LABS: Basic Metabolic Panel:  Basename 01/18/12 0453 01/17/12 0500  NA 138 140  K 4.0 3.3*  CL 104 104  CO2 27 32  GLUCOSE 207* 219*  BUN 17 17  CREATININE 0.69 0.70  CALCIUM 8.1* 8.0*  MG 1.9 1.7  PHOS 3.2 3.1   Liver Function Tests:  Basename 01/17/12 0500  AST 10  ALT 6  ALKPHOS 31*  BILITOT 0.3  PROT 4.8*  ALBUMIN 1.8*   No results found for this basename:  LIPASE:2,AMYLASE:2 in the last 72 hours CBC:  Basename 01/18/12 0453 01/17/12 0500  WBC 7.5 7.1  NEUTROABS -- 5.7  HGB 10.0* 10.1*  HCT 30.8* 31.1*  MCV 90.1 91.2  PLT 173 165   Cardiac Enzymes: No results found for this basename: CKTOTAL:3,CKMB:3,CKMBINDEX:3,TROPONINI:3 in the last 72 hours BNP: No components found with this basename: POCBNP:3 D-Dimer: No results found for this basename: DDIMER:2 in the last 72 hours Hemoglobin A1C: No results found for this basename: HGBA1C in the last 72 hours Fasting Lipid Panel:  Basename 01/17/12 0500  CHOL 108  HDL --  LDLCALC --  TRIG 176*  CHOLHDL --  LDLDIRECT --   Thyroid Function Tests: No results found for this basename: TSH,T4TOTAL,FREET3,T3FREE,THYROIDAB in the last 72 hours Anemia Panel: No results found for this basename: VITAMINB12,FOLATE,FERRITIN,TIBC,IRON,RETICCTPCT in the last 72 hours   PHYSICAL EXAM General: NAD Neck: JVP difficult, possible mild elevation Lungs: Clear to auscultation anteriorly CV: Nondisplaced PMI.  Heart irregular S1/S2,  no S3/S4, no murmur.  No peripheral edema.  Abdomen: Soft, has colostomy. Neurologic: Alert and oriented x 3.  Psych: Normal affect. Extremities: No clubbing or cyanosis.   TELEMETRY: Reviewed telemetry pt in atrial fibrillation with BiV pacing.   ASSESSMENT AND PLAN: 71 yo with mechanical mitral valve, chronic atrial fibrillation, chronic systolic CHF admitted with diverticulitis and perforation, now s/p colectomy and colostomy.   1. CHF: Chronic systolic CHF, mild volume overload with CVP 9-10.  Will give IV dose of Lasix today, resume home po Lasix tomorrow.  2. Atrial fibrillation: Rate-controlled.  Change back to po meds (digoxin, Coreg).  3. Mechanical MV: On heparin gtt, will resume coumadin today.  4. Delirium: No fever.  May be due to meds (morphine)/medical illness.    [name] 01/18/2012 11:22 AM

## 2012-01-18 NOTE — Progress Notes (Signed)
6 Days Post-Op  Subjective: Awake but confused.  Family at bedside report he is saying confusing things  Objective: Vital signs in last 24 hours: Temp:  [98.3 F (36.8 C)-99 F (37.2 C)] 99 F (37.2 C) (01/27 0800) Pulse Rate:  [54-125] 125  (01/27 0800) Resp:  [18-28] 26  (01/27 0800) BP: (116-150)/(48-86) 123/54 mmHg (01/27 0800) SpO2:  [90 %-99 %] 96 % (01/27 0800) FiO2 (%):  [93 %] 93 % (01/26 1657) Weight:  [169 lb 12.1 oz (77 kg)] 169 lb 12.1 oz (77 kg) (01/27 0500) Last BM Date: 01/17/12  Intake/Output from previous day: 01/26 0701 - 01/27 0700 In: 2523.5 [I.V.:798.5; IV Piggyback:350; TPN:1375] Out: 1285 [Urine:1070; Emesis/NG output:100; Drains:115] Intake/Output this shift: Total I/O In: 190 [I.V.:60; TPN:130] Out: 250 [Urine:250]  Patient confused, but follows commands, moves all extremities Abdomen soft, ostomy pink Lab Results:   Basename 01/18/12 0453 01/17/12 0500  WBC 7.5 7.1  HGB 10.0* 10.1*  HCT 30.8* 31.1*  PLT 173 165   BMET  Basename 01/18/12 0453 01/17/12 0500  NA 138 140  K 4.0 3.3*  CL 104 104  CO2 27 32  GLUCOSE 207* 219*  BUN 17 17  CREATININE 0.69 0.70  CALCIUM 8.1* 8.0*   PT/INR No results found for this basename: LABPROT:2,INR:2 in the last 72 hours ABG No results found for this basename: PHART:2,PCO2:2,PO2:2,HCO3:2 in the last 72 hours  Studies/Results: Dg Chest Port 1 View  01/17/2012  *RADIOLOGY REPORT*  Clinical Data: Postop, COPD, atelectasis  PORTABLE CHEST - 1 VIEW  Comparison: 01/16/2012; 01/15/2012; 01/14/2012  Findings:  Grossly unchanged enlarged cardiac silhouette and mediastinal contours post median sternotomy and CABG.  Stable position of support apparatus.  No pneumothorax.  Lung volumes remain reduced. There is persistent mild elevation of the right hemidiaphragm. Grossly unchanged bibasilar heterogeneous opacities.  No new focal airspace opacities.  No definite pleural effusion.  Unchanged bones.  IMPRESSION: 1.   Grossly unchanged bibasilar opacities favored to represent atelectasis. 2.   Stable position of support apparatus.  Original Report Authenticated By: Waynard Reeds, M.D.    Anti-infectives: Anti-infectives     Start     Dose/Rate Route Frequency Ordered Stop   01/12/12 2200   ertapenem (INVANZ) 1 g in sodium chloride 0.9 % 50 mL IVPB  Status:  Discontinued        1 g 100 mL/hr over 30 Minutes Intravenous Every 24 hours 01/12/12 2106 01/12/12 2115   01/12/12 1800   ciprofloxacin (CIPRO) IVPB 400 mg  Status:  Discontinued        400 mg 200 mL/hr over 60 Minutes Intravenous Every 12 hours 01/12/12 0102 01/12/12 1018   01/12/12 1400   metroNIDAZOLE (FLAGYL) IVPB 500 mg  Status:  Discontinued        500 mg 100 mL/hr over 60 Minutes Intravenous Every 8 hours 01/12/12 0102 01/12/12 1018   01/12/12 1030   ertapenem (INVANZ) 1 g in sodium chloride 0.9 % 50 mL IVPB        1 g 100 mL/hr over 30 Minutes Intravenous Every 24 hours 01/12/12 1018     01/12/12 0115   ciprofloxacin (CIPRO) IVPB 400 mg        400 mg 200 mL/hr over 60 Minutes Intravenous  Once 01/12/12 0105 01/12/12 0613   01/12/12 0115   metroNIDAZOLE (FLAGYL) IVPB 500 mg        500 mg 100 mL/hr over 60 Minutes Intravenous  Once 01/12/12 0105 01/12/12 0444  Assessment/Plan: s/p Procedure(s): COLON RESECTION  D/C ng I suspect patient is sundowning.  Will defer to internal med/CCM Start po  LOS: 7 days    Uriyah Raska A 01/18/2012

## 2012-01-18 NOTE — Progress Notes (Signed)
Name: [name] MRN: [id number] DOB: 1941-07-03    LOS: 7  PCCM PROGRESS  NOTE  History of Present Illness:  Admitted to [facility] with COPD exacerbation on 1/14.  Developed abdominal pain.  CT scan on 1/18 demonstrated diverticulitis and free air.  Started on antibiotics.  Transferred to [facility] on 1/20.  Repeat  CT scan demonstrated pneumoperitoneum and developing abscess.  Brought to OR on 1/21 for exploratory laparotomy, drainage of intraabdominal abscess, sigmoid colectomy and colostomy.  Lines / Drains: 1/21  ETT>>1/23 1/21  NGT>> 1/21  Foley>> 1/21  R IJ TLC>> 1/21  JP drain>>   Cultures: None  Antibiotics: 1/18  Cipro>>> 1/21 1/18  Flagyl (abd abscess)>>1/21 1/21  Ertapenem (abd abscess)>>>  Tests / Events: 1/20  Abdominal CT>>>pneumoperitoneum / intraabdominal abscess 1/21  Exploratory laparotomy, drainage of intraabdominal abscess, sigmoid colectomy and colostomy  SUBJ: No increased wob, no new complaints.  Daughter worried about mild confusion, but pt is appropriate and answering questions. Will monitor next 24-48 hrs, ?med effect?  Past Medical History  Diagnosis Date  . Atrial fibrillation   . Hypertension   . Hyperkalemia   . Acute systolic heart failure   . Coronary artery disease     Cath 2005- patent LIMA-LAD, SVG-OM grafts, moderate MR  . Prostate cancer     Vital Signs: Temp:  [98.3 F (36.8 C)-99 F (37.2 C)] 99 F (37.2 C) (01/27 0800) Pulse Rate:  [54-125] 125  (01/27 0800) Resp:  [18-28] 26  (01/27 0800) BP: (117-150)/(48-86) 123/54 mmHg (01/27 0800) SpO2:  [90 %-98 %] 96 % (01/27 0800) FiO2 (%):  [93 %] 93 % (01/26 1657) Weight:  [77 kg (169 lb 12.1 oz)] 77 kg (169 lb 12.1 oz) (01/27 0500) I/O last 3 completed shifts: In: 3583.5 [I.V.:1248.5; IV Piggyback:360] Out: 2040 [Urine:1605; Emesis/NG output:250; Drains:185]  Physical Examination: General:  Awake and alert, no distress Neuro: awake and alert, interactive, non  focal HEENT:  NGT,  Neck:  No JVD   Cardiovascular: Irregularly irregular rate, no murmurs Lungs:  Bilateral diminished air entry, no wheezing, scattered rhonchi Abdomen:  Viable colostomy, surgically dressing clean / dry / intact Musculoskeletal: No edema Skin:  No rash   Vent Mode:  [-]  FiO2 (%):  [93 %] 93 %  Labs and Imaging:  Reviewed.  Please refer to the Assessment and Plan section for relevant results. Dg Chest Port 1 View  01/17/2012    PORTABLE CHEST - 1 VIEW  Comparison: 01/16/2012; 01/15/2012; 01/14/2012   IMPRESSION: 1.  Grossly unchanged bibasilar opacities favored to represent atelectasis. 2.   Stable position of support apparatus.    Assessment and Plan:  Intraabdominal abscess, s/p eploratory laparotomy, drainage of intraabdominal abscess, sigmoid colectomy and colostomy.  No signs of sepsis.  Lab 01/18/12 0453 01/17/12 0500 01/16/12 0400 01/15/12 0425 01/14/12 0400  WBC 7.5 7.1 7.6 10.2 11.4*   -->  Antibiotics to continue with Invanz monotherapy -->  Surgery following -->PCA for pain control --> Foley can come out once mobile, self cath at home due to prostate issues    Post-operative respiratory failure RESOLVED.  History of COPD.    Lab 01/12/12 2143  PHART 7.422  PCO2ART 46.1*  PO2ART 117.0*   The pt has no wheezing on exam, and sats are adequate.  Continue BD regimen, and I have asked pt to work hard on IS given his abdominal issues.   History of CAD s/p CABG.  History of MR with mechanical valve  replacement.  Ischemic cardiomyopathy.  Atrial fibrillation.  No signs of active ischemia.  Atrial fibrillation with controlled rate.  Dyslipidemia.  Management per cardiology   Hematemesis is on the record but no active hematemesis noted and no bloody discharge from NG.  This is on the background of Coumadin, ASA and Indocin.  Stable hemoglobin.  Lab 01/18/12 0453 01/17/12 0500 01/16/12 0400 01/15/12 0425 01/14/12 0400  HCT 30.8* 31.1* 32.5* 32.0*  33.0*   -->  Protonix q12h IV  Coumadin induced coagulopathy, s/p FFP / Vit K  Lab 01/13/12 0420 01/12/12 1542 01/12/12 0545 01/11/12 2340  INR 1.36 1.52* 2.53* 2.65*   -->  Heparin per pharmacy as above  Diabetes / hyperglycemia  Lab 01/18/12 1114 01/18/12 0754 01/18/12 0429 01/18/12 0011 01/17/12 2025  GLUCAP 160* 186* 218* 174* 164*   -->  SSI HG protocol Add lantus 10  Protein Calorie Malnutrition Severe -->On TPN, hope to start PO in 24 h, CVL to stay in while on TPN   [name]   01/18/2012, 12:11 PM

## 2012-01-19 DIAGNOSIS — I4891 Unspecified atrial fibrillation: Secondary | ICD-10-CM | POA: Diagnosis not present

## 2012-01-19 DIAGNOSIS — J9600 Acute respiratory failure, unspecified whether with hypoxia or hypercapnia: Secondary | ICD-10-CM | POA: Diagnosis not present

## 2012-01-19 DIAGNOSIS — I5021 Acute systolic (congestive) heart failure: Secondary | ICD-10-CM | POA: Diagnosis not present

## 2012-01-19 DIAGNOSIS — K651 Peritoneal abscess: Secondary | ICD-10-CM | POA: Diagnosis not present

## 2012-01-19 DIAGNOSIS — G9340 Encephalopathy, unspecified: Secondary | ICD-10-CM | POA: Diagnosis not present

## 2012-01-19 DIAGNOSIS — J449 Chronic obstructive pulmonary disease, unspecified: Secondary | ICD-10-CM | POA: Diagnosis not present

## 2012-01-19 DIAGNOSIS — I5022 Chronic systolic (congestive) heart failure: Secondary | ICD-10-CM | POA: Diagnosis not present

## 2012-01-19 LAB — COMPREHENSIVE METABOLIC PANEL
ALT: 10 U/L (ref 0–53)
AST: 19 U/L (ref 0–37)
Albumin: 1.7 g/dL — ABNORMAL LOW (ref 3.5–5.2)
Alkaline Phosphatase: 32 U/L — ABNORMAL LOW (ref 39–117)
GFR calc Af Amer: >90 mL/min (ref >90)
Glucose, Bld: 169 mg/dL — ABNORMAL HIGH (ref 70–99)
Potassium: 3.6 mEq/L (ref 3.5–5.1)
Sodium: 136 mEq/L (ref 135–145)
Total Protein: 4.8 g/dL — ABNORMAL LOW (ref 6.0–8.3)

## 2012-01-19 LAB — DIFFERENTIAL
Eosinophils Absolute: 0.1 10*3/uL (ref 0.0–0.7)
Eosinophils Relative: 2 % (ref 0–5)
Lymphocytes Relative: 10 % — ABNORMAL LOW (ref 12–46)
Lymphs Abs: 0.7 10*3/uL (ref 0.7–4.0)
Monocytes Absolute: 0.6 10*3/uL (ref 0.1–1.0)
Monocytes Relative: 9 % (ref 3–12)

## 2012-01-19 LAB — HEPARIN LEVEL (UNFRACTIONATED): Heparin Unfractionated: 0.42 IU/mL (ref 0.30–0.70)

## 2012-01-19 LAB — CBC
HCT: 28.8 % — ABNORMAL LOW (ref 39.0–52.0)
Hemoglobin: 9.6 g/dL — ABNORMAL LOW (ref 13.0–17.0)
MCH: 29.9 pg (ref 26.0–34.0)
MCV: 89.7 fL (ref 78.0–100.0)
RBC: 3.21 MIL/uL — ABNORMAL LOW (ref 4.22–5.81)

## 2012-01-19 LAB — GLUCOSE, CAPILLARY
Glucose-Capillary: 161 mg/dL — ABNORMAL HIGH (ref 70–99)
Glucose-Capillary: 171 mg/dL — ABNORMAL HIGH (ref 70–99)
Glucose-Capillary: 193 mg/dL — ABNORMAL HIGH (ref 70–99)

## 2012-01-19 MED ORDER — FENTANYL CITRATE 0.05 MG/ML IJ SOLN
12.5000 ug | INTRAMUSCULAR | Status: DC | PRN
  Administered 2012-01-19 – 2012-01-23 (×16): 12.5 ug via INTRAVENOUS
  Administered 2012-01-24: 09:00:00 via INTRAVENOUS
  Administered 2012-01-24 – 2012-01-27 (×4): 12.5 ug via INTRAVENOUS
  Filled 2012-01-19 (×21): qty 2

## 2012-01-19 MED ORDER — TRACE MINERALS CR-CU-MN-SE-ZN 10-1000-500-60 MCG/ML IV SOLN
INTRAVENOUS | Status: AC
  Administered 2012-01-19: 18:00:00 via INTRAVENOUS
  Filled 2012-01-19: qty 2000

## 2012-01-19 MED ORDER — FUROSEMIDE 10 MG/ML IJ SOLN
20.0000 mg | Freq: Once | INTRAMUSCULAR | Status: AC
  Administered 2012-01-19: 20 mg via INTRAVENOUS
  Filled 2012-01-19: qty 2

## 2012-01-19 MED ORDER — PANTOPRAZOLE SODIUM 40 MG PO TBEC
40.0000 mg | DELAYED_RELEASE_TABLET | Freq: Two times a day (BID) | ORAL | Status: DC
  Administered 2012-01-19 – 2012-01-20 (×3): 40 mg via ORAL
  Filled 2012-01-19 (×3): qty 1

## 2012-01-19 MED ORDER — WARFARIN SODIUM 5 MG PO TABS
5.0000 mg | ORAL_TABLET | Freq: Once | ORAL | Status: AC
  Administered 2012-01-19: 5 mg via ORAL
  Filled 2012-01-19: qty 1

## 2012-01-19 MED ORDER — FAT EMULSION 20 % IV EMUL
250.0000 mL | INTRAVENOUS | Status: AC
  Administered 2012-01-19: 250 mL via INTRAVENOUS
  Filled 2012-01-19: qty 250

## 2012-01-19 NOTE — Progress Notes (Signed)
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 Codeine     REACTION: hives  . Sulfonamide Derivatives     REACTION: hives  . Tylenol-Codeine     REACTION: hives    Patient Measurements: Height: 5' 4.8" (164.6 cm) Weight: 169 lb 12.1 oz (77 kg) IBW/kg (Calculated) : 61.04  Adjusted Body Weight: 65 Kg   Vital Signs: Temp: 99.3 F (37.4 C) (01/28 0739) Temp src: Oral (01/28 0739) BP: 136/47 mmHg (01/28 0739) Pulse Rate: 83  (01/28 0739) Intake/Output from previous day: 01/27 0701 - 01/28 0700 In: 3345 [P.O.:890; I.V.:910; IV Piggyback:50; TPN:1495] Out: 3130 [Urine:2750; Emesis/NG output:100; Drains:130; Stool:150] Intake/Output from this shift:    Labs:  Basename 01/19/12 0408 01/18/12 0453 01/17/12 0500  WBC 7.2 7.5 7.1  HGB 9.6* 10.0* 10.1*  HCT 28.8* 30.8* 31.1*  PLT 174 173 165  APTT -- -- --  INR 1.32 -- --     Basename 01/19/12 0408 01/18/12 0453 01/17/12 0500  NA 136 138 140  K 3.6 4.0 3.3*  CL 100 104 104  CO2 31 27 32  GLUCOSE 169* 207* 219*  BUN 20 17 17   CREATININE 0.78 0.69 0.70  LABCREA -- -- --  CREAT24HRUR -- -- --  CALCIUM 8.1* 8.1* 8.0*  MG 1.8 1.9 1.7  PHOS 4.2 3.2 3.1  PROT 4.8* -- 4.8*  ALBUMIN 1.7* -- 1.8*  AST 19 -- 10  ALT 10 -- 6  ALKPHOS 32* -- 31*  BILITOT 0.3 -- 0.3  BILIDIR -- -- --  IBILI -- -- --  PREALBUMIN -- -- 6.9*  TRIG -- -- 176*  CHOLHDL -- -- --  CHOL -- -- 108   Estimated Creatinine Clearance: 79.6 ml/min (by C-G formula based on Cr of 0.78).    Basename 01/19/12 0815 01/19/12 0345 01/18/12 2313  GLUCAP 171* 161* 139*    Insulin Requirements in the past 12 hours:  30 units SSI, Lantus 15 units, TPN bag contains 35 units regular insulin  Current Nutrition:  Advance clears to full liquid diet.  Nutritional Goals per RD:  1900-2100 kCal, 100-110 grams of protein per day;  Fluid: 1.9-2.1 L/d  Assessment: Diverticulitis of large intestine with perforation, s/p sig colectomy and colostomy 1/21.   GI Prolonged ileus. Emesis/NG output 100cc. PO intake 890cc/24h. Full liquids starting today. Stool and flatus noted by MD.  Endo h/o DM (on metformin at home) .Lantus started 15uts/hd. CBGs 139-197 (30 units SSI/24h).    Lytes/Renal Lytes WNL today  (Phos 3.2 to 4.2 today).   Hepatobil LFTs WNL, Chol WNL, trig slightly high with hx of HLD, but not compelling for fat dose adjustments. Prealb low, expected post-op and inflammation  Pulm/Neuro H/o COPD:  on atrovent, levalbuterol  Cardiology h/o AFIB/MVR/HF/CAD/OHS/HTN: on heparin gtt/Coumadin. VSS. (home meds not resumed: statin/lasix)  ID Intra abd abscess  on Ertapenem. No cx pending. MRSA PCR neg. Tmax 100. WBC 7.2  Px PO PPI BID and IV heparin/Coumadin (hx afib, MVR)     Plan:  1. Continue Clinimix E 5/15 at 64ml/hr 2. MWF supplementation of lipids (48ml/hr), MVI and trace elements d/t national shortages. 3. Increase insulin in TPN to 50 units/bag 4. PPI from IV to po

## 2012-01-19 NOTE — Progress Notes (Signed)
Subjective:  Appears to be slowly improving.  No chest pain.    Objective:  Vital Signs in the last 24 hours: Temp:  [97.7 F (36.5 C)-100 F (37.8 C)] 99.3 F (37.4 C) (01/28 0739) Pulse Rate:  [69-111] 83  (01/28 0739) Resp:  [17-35] 25  (01/28 0739) BP: (98-136)/(36-61) 136/47 mmHg (01/28 0739) SpO2:  [93 %-100 %] 99 % (01/28 0750) FiO2 (%):  [28 %] 28 % (01/28 0750)  Intake/Output from previous day: 01/27 0701 - 01/28 0700 In: 3345 [P.O.:890; I.V.:910; IV Piggyback:50; TPN:1495] Out: 3130 [Urine:2750; Emesis/NG output:100; Drains:130; Stool:150]   Physical Exam: General: Well developed, well nourished, in no acute distress. Head:  Normocephalic and atraumatic. Lungs: Ronchii bilaterally, prob upper airway.  Marland Kitchen Heart: Normal S1 and S2.  No murmur, rubs or gallops.  Pulses: Pulses normal in all 4 extremities. Extremities: No clubbing or cyanosis. No edema. Neurologic: Alert and oriented x 3. Surgical site looks good.  Ostomy functional    Lab Results:  Basename 01/19/12 0408 01/18/12 0453  WBC 7.2 7.5  HGB 9.6* 10.0*  PLT 174 173    Basename 01/19/12 0408 01/18/12 0453  NA 136 138  K 3.6 4.0  CL 100 104  CO2 31 27  GLUCOSE 169* 207*  BUN 20 17  CREATININE 0.78 0.69   No results found for this basename: TROPONINI:2,CK,MB:2 in the last 72 hours Hepatic Function Panel  Basename 01/19/12 0408  PROT 4.8*  ALBUMIN 1.7*  AST 19  ALT 10  ALKPHOS 32*  BILITOT 0.3  BILIDIR --  IBILI --    Basename 01/17/12 0500  CHOL 108   No results found for this basename: PROTIME in the last 72 hours  Imaging: No results found.    Telemetry reviewed.    Assessment/Plan:  Patient Active Hospital Problem List: Diverticulitis of large intestine with perforation s/p sigmoid colectomy and colostomy 01/12/12 (01/12/2012)   Assessment: per surgery.  Seems to be doing well.  Albumin is low   Plan: see orders Atrial fibrillation (05/01/2009)   Assessment: good rate  control.   Plan: tolerating carvedilol as med for now.  Dig is on hold.   Chronic systolic heart failure (05/01/2009)   Assessment: I/0 negative yesterday.   Plan: prn furosemide      Shawnie Pons, MD, [facility], [facility] 01/19/2012, 8:40 AM

## 2012-01-19 NOTE — Progress Notes (Signed)
Name: [name] MRN: [id number] DOB: 1941/08/19    LOS: 8  PCCM PROGRESS  NOTE  History of Present Illness:  Admitted to [facility] with COPD exacerbation on 1/14.  Developed abdominal pain.  CT scan on 1/18 demonstrated diverticulitis and free air.  Started on antibiotics.  Transferred to [facility] on 1/20.  Repeat  CT scan demonstrated pneumoperitoneum and developing abscess.  Brought to OR on 1/21 for exploratory laparotomy, drainage of intraabdominal abscess, sigmoid colectomy and colostomy.  Lines / Drains: 1/21  ETT>>1/23 1/21  NGT>>out 1/21  Foley>> 1/21  R IJ TLC>> 1/21  JP drain>>  Cultures: None  Antibiotics: 1/18  Cipro>>> 1/21 1/18  Flagyl (abd abscess)>>1/21 1/21  Ertapenem (abd abscess)>>>  Tests / Events: 1/20  Abdominal CT>>>pneumoperitoneum / intraabdominal abscess 1/21  Exploratory laparotomy, drainage of intraabdominal abscess, sigmoid colectomy and colostomy  SUBJ: No increased wob, no new complaints.  Daughter worried about mild confusion, but pt is appropriate and answering questions. Will monitor next 24-48 hrs, ?med effect?  Vital Signs: Temp:  [97.7 F (36.5 C)-100 F (37.8 C)] 99.3 F (37.4 C) (01/28 0739) Pulse Rate:  [69-111] 110  (01/28 1012) Resp:  [17-35] 25  (01/28 0739) BP: (98-136)/(36-61) 136/47 mmHg (01/28 0739) SpO2:  [93 %-100 %] 98 % (01/28 1012) FiO2 (%):  [28 %] 28 % (01/28 0750) I/O last 3 completed shifts: In: 4689 [P.O.:890; I.V.:1354; IV Piggyback:50] Out: 3855 [Urine:3350; Emesis/NG output:200; Drains:155; Stool:150]  Physical Examination: General:  Awake and alert, no distress Neuro: awake and alert, interactive, non focal HEENT:  NGT,  Neck:  No JVD   Cardiovascular: Irregularly irregular rate, no murmurs Lungs:  Bilateral diminished air entry, no wheezing, scattered rhonchi Abdomen:  Viable colostomy, surgically dressing clean / dry / intact Musculoskeletal: No edema Skin:  No rash   Vent Mode:  [-]  FiO2  (%):  [28 %] 28 %  Labs and Imaging:   No results found.  Lab 01/19/12 0408 01/18/12 0453 01/17/12 0500  NA 136 138 140  K 3.6 4.0 3.3*  CL 100 104 104  CO2 31 27 32  BUN 20 17 17   CREATININE 0.78 0.69 0.70  GLUCOSE 169* 207* 219*    Lab 01/19/12 0408 01/18/12 0453 01/17/12 0500  HGB 9.6* 10.0* 10.1*  HCT 28.8* 30.8* 31.1*  WBC 7.2 7.5 7.1  PLT 174 173 165     Assessment and Plan:  Intraabdominal abscess, s/p eploratory laparotomy, drainage of intraabdominal abscess, sigmoid colectomy and colostomy.  No signs of sepsis.  Lab 01/19/12 0408 01/18/12 0453 01/17/12 0500 01/16/12 0400 01/15/12 0425  WBC 7.2 7.5 7.1 7.6 10.2   -->  Antibiotics to continue with Invanz monotherapy, low threshold to change to cover pseudo if spike or decline -->  Surgery following -->PCA for pain control, not able to use, drop in BP and MS contribution? --> Foley can come out once mobile, self cath at home due to prostate issues   Post-operative respiratory failure RESOLVED.  History of COPD.    Lab 01/12/12 2143  PHART 7.422  PCO2ART 46.1*  PO2ART 117.0*   Resolving  History of CAD s/p CABG.  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  Ischemic cardiomyopathy.  Atrial fibrillation.  No signs of active ischemia.  Atrial fibrillation with controlled rate.  Dyslipidemia.  Management per cardiology Hep drip noted Low BP this am , dc ACEi, ARB, coreg Dc PCA Add fent  Hematemesis is on the record but no active hematemesis  noted and no bloody discharge from NG.  This is on the background of Coumadin, ASA and Indocin.  Stable hemoglobin.  Lab 01/19/12 0408 01/18/12 0453 01/17/12 0500 01/16/12 0400 01/15/12 0425  HCT 28.8* 30.8* 31.1* 32.5* 32.0*   -->  Protonix q12h IV Cbc in am   Coumadin induced coagulopathy, s/p FFP / Vit K  Lab 01/19/12 0408 01/13/12 0420 01/12/12 1542  INR 1.32 1.36 1.52*   -->  Heparin per pharmacy as above  Diabetes / hyperglycemia  Lab 01/19/12 0815  01/19/12 0345 01/18/12 2313 01/18/12 2007 01/18/12 1531  GLUCAP 171* 161* 139* 197* 165*   -->  SSI HG protocol Add lantus 10 Overall controlled  Protein Calorie Malnutrition Severe -->On TPN, hope to start PO in 24 h, CVL to stay in while on TPN Dc cvp fam updated  [name]. Tyson Alias, MD, FACP Pgr: [phone] [facility]   Brett Canales Minor ACNP [facility] PCCM Pager [phone] till 3 pm If no answer page [phone] 01/19/2012, 10:24 AM

## 2012-01-19 NOTE — Progress Notes (Signed)
Physical Therapy Treatment Patient Details Name: [name] MRN: [id number] DOB: 30-Oct-1941 Today's Date: 01/19/2012  PT Assessment/Plan  PT - Assessment/Plan Comments on Treatment Session:  Discussed possibility of CIR with wife but she seems adamant that her husband will go home. I don't think she truly understands what CIR is. Given his debilitation he would definitely benefit from CIR prior to d/c home. Will change my d/c recommendations. Pt also would benefit from an OT consult.  PT Plan: Discharge plan needs to be updated;Frequency remains appropriate Recommendations for Other Services: Rehab consult;OT consult Follow Up Recommendations: Inpatient Rehab Equipment Recommended: Defer to next venue PT Goals  Acute Rehab PT Goals PT Goal: Rolling Supine to Right Side - Progress: Progressing toward goal PT Goal: Rolling Supine to Left Side - Progress: Progressing toward goal PT Goal: Supine/Side to Sit - Progress: Progressing toward goal PT Goal: Sit at Edge Of Bed - Progress: Progressing toward goal PT Goal: Sit to Supine/Side - Progress: Progressing toward goal PT Goal: Sit to Stand - Progress: Progressing toward goal PT Goal: Stand to Sit - Progress: Progressing toward goal PT Transfer Goal: Bed to Chair/Chair to Bed - Progress: Progressing toward goal PT Goal: Ambulate - Progress: Progressing toward goal PT Goal: Up/Down Stairs - Progress: Not progressing (not addressed today)  PT Treatment Precautions/Restrictions  Precautions Precautions: Fall Required Braces or Orthoses: No Restrictions Weight Bearing Restrictions: No Mobility (including Balance) Bed Mobility Rolling Right:  (50%) Rolling Left: 1: +2 Total assist (50%) Rolling Left Details (indicate cue type and reason): +2totalpt50%; pt continues to need sequencing cues and assist to initiate and for follow through; slower processing  Left Sidelying to Sit: 1: +2 Total assist (40%) Sitting - Scoot to Edge of Bed: 2:  Max assist Sitting - Scoot to [place] of Bed Details (indicate cue type and reason): use of pad to scoot hips reciprocally Transfers Sit to Stand: 1: +2 Total assist;From bed;From chair/3-in-1;From elevated surface;With upper extremity assist (65%) Sit to Stand Details (indicate cue type and reason): facilitation and cues for anterior translation and feet on floor in prep to stand; assist for follow through; pt very slow to come into standing; difficulty getting tall posture because of abdominal pain (pt standing from elevated surface x3) Stand to Sit: 3: Mod assist;To chair/3-in-1 Stand to Sit Details: cues for seuencing and assist to slow descent Ambulation/Gait Ambulation/Gait Assistance: 3: Mod assist Ambulation/Gait Assistance Details (indicate cue type and reason): mod facilitatory cues/verbal cues for upright posture and anterior weight shift as pt flexed with posterior lean; amb 10 ft, sat and then amb another 10 ft after seated rest break; pt needing a lot of encouragement Ambulation Distance (Feet): 20 Feet Assistive device: Rolling walker Gait Pattern: Trunk flexed;Shuffle Stairs: No  Static Standing Balance Static Standing - Balance Support: Bilateral upper extremity supported Static Standing - Level of Assistance: 3: Mod assist Static Standing - Comment/# of Minutes: in static standing pt with weight in heels and pulling on RW; poor trunk activation for tall posture and minimal balance reactions noted as pt begins to fall posteriorly in standing Exercise  Total Joint Exercises Ankle Circles/Pumps: AROM;Both;10 reps;Supine End of Session PT - End of Session Equipment Utilized During Treatment: Gait belt Activity Tolerance: Patient tolerated treatment well;Patient limited by fatigue;Patient limited by pain Patient left: in chair;with call bell in reach;with family/visitor present Nurse Communication: Mobility status for transfers;Mobility status for ambulation General Behavior  During Session: Flat affect Cognition: Impaired Cognitive Impairment: slow to process; follows  commands with increased time  [facility] HELEN 01/19/2012, 10:58 AM

## 2012-01-19 NOTE — Progress Notes (Signed)
7 Days Post-Op  Subjective: Oriented better.   Tolerating clear liquids fair without nausea. Passing some stool and flatus per colostomy, but not large amounts.  He got up to chair once yesterday. PT involved.  Objective: Vital signs in last 24 hours: Temp:  [97.7 F (36.5 C)-100 F (37.8 C)] 98.1 F (36.7 C) (01/28 0400) Pulse Rate:  [69-125] 82  (01/28 0600) Resp:  [17-35] 33  (01/28 0600) BP: (98-136)/(36-60) 118/43 mmHg (01/28 0600) SpO2:  [93 %-100 %] 98 % (01/28 0600) Last BM Date: 01/18/12  Intake/Output from previous day: 01/27 0701 - 01/28 0700 In: 3241.5 [P.O.:890; I.V.:871.5; IV Piggyback:50; TPN:1430] Out: 3130 [Urine:2750; Emesis/NG output:100; Drains:130; Stool:150] Intake/Output this shift: Total I/O In: 1138.5 [I.V.:423.5; TPN:715] Out: 980 [Urine:850; Drains:80; Stool:50]  General appearance: alert. Deconditioned. In no acute distress. Resp: vital capacity decreased. Occasional rhonchi. No wheezing. GI: abdomen is soft, nondistended but not with mild diffuse tenderness. Colostomy viable with some output. Midline wound open, a little bit of exudate. Fascia intact.  Lab Results:  Results for orders placed during the hospital encounter of 01/11/12 (from the past 24 hour(s))  GLUCOSE, CAPILLARY     Status: Abnormal   Collection Time   01/18/12  7:54 AM      Component Value Range   Glucose-Capillary 186 (*) 70 - 99 (mg/dL)   Comment 1 Notify RN    GLUCOSE, CAPILLARY     Status: Abnormal   Collection Time   01/18/12 11:14 AM      Component Value Range   Glucose-Capillary 160 (*) 70 - 99 (mg/dL)  GLUCOSE, CAPILLARY     Status: Abnormal   Collection Time   01/18/12  3:31 PM      Component Value Range   Glucose-Capillary 165 (*) 70 - 99 (mg/dL)  GLUCOSE, CAPILLARY     Status: Abnormal   Collection Time   01/18/12  8:07 PM      Component Value Range   Glucose-Capillary 197 (*) 70 - 99 (mg/dL)  GLUCOSE, CAPILLARY     Status: Abnormal   Collection Time   01/18/12 11:13 PM      Component Value Range   Glucose-Capillary 139 (*) 70 - 99 (mg/dL)  GLUCOSE, CAPILLARY     Status: Abnormal   Collection Time   01/19/12  3:45 AM      Component Value Range   Glucose-Capillary 161 (*) 70 - 99 (mg/dL)  HEPARIN LEVEL (UNFRACTIONATED)     Status: Normal   Collection Time   01/19/12  4:08 AM      Component Value Range   Heparin Unfractionated 0.42  0.30 - 0.70 (IU/mL)  COMPREHENSIVE METABOLIC PANEL     Status: Abnormal   Collection Time   01/19/12  4:08 AM      Component Value Range   Sodium 136  135 - 145 (mEq/L)   Potassium 3.6  3.5 - 5.1 (mEq/L)   Chloride 100  96 - 112 (mEq/L)   CO2 31  19 - 32 (mEq/L)   Glucose, Bld 169 (*) 70 - 99 (mg/dL)   BUN 20  6 - 23 (mg/dL)   Creatinine, Ser 9.60  0.50 - 1.35 (mg/dL)   Calcium 8.1 (*) 8.4 - 10.5 (mg/dL)   Total Protein 4.8 (*) 6.0 - 8.3 (g/dL)   Albumin 1.7 (*) 3.5 - 5.2 (g/dL)   AST 19  0 - 37 (U/L)   ALT 10  0 - 53 (U/L)   Alkaline Phosphatase 32 (*) 39 -  117 (U/L)   Total Bilirubin 0.3  0.3 - 1.2 (mg/dL)   GFR calc non Af Amer 88 (*) >90 (mL/min)   GFR calc Af Amer >90  >90 (mL/min)  MAGNESIUM     Status: Normal   Collection Time   01/19/12  4:08 AM      Component Value Range   Magnesium 1.8  1.5 - 2.5 (mg/dL)  PHOSPHORUS     Status: Normal   Collection Time   01/19/12  4:08 AM      Component Value Range   Phosphorus 4.2  2.3 - 4.6 (mg/dL)  CBC     Status: Abnormal   Collection Time   01/19/12  4:08 AM      Component Value Range   WBC 7.2  4.0 - 10.5 (K/uL)   RBC 3.21 (*) 4.22 - 5.81 (MIL/uL)   Hemoglobin 9.6 (*) 13.0 - 17.0 (g/dL)   HCT 11.9 (*) 14.7 - 52.0 (%)   MCV 89.7  78.0 - 100.0 (fL)   MCH 29.9  26.0 - 34.0 (pg)   MCHC 33.3  30.0 - 36.0 (g/dL)   RDW 82.9  56.2 - 13.0 (%)   Platelets 174  150 - 400 (K/uL)  DIFFERENTIAL     Status: Abnormal   Collection Time   01/19/12  4:08 AM      Component Value Range   Neutrophils Relative 79 (*) 43 - 77 (%)   Neutro Abs 5.7  1.7 - 7.7  (K/uL)   Lymphocytes Relative 10 (*) 12 - 46 (%)   Lymphs Abs 0.7  0.7 - 4.0 (K/uL)   Monocytes Relative 9  3 - 12 (%)   Monocytes Absolute 0.6  0.1 - 1.0 (K/uL)   Eosinophils Relative 2  0 - 5 (%)   Eosinophils Absolute 0.1  0.0 - 0.7 (K/uL)   Basophils Relative 0  0 - 1 (%)   Basophils Absolute 0.0  0.0 - 0.1 (K/uL)  PROTIME-INR     Status: Abnormal   Collection Time   01/19/12  4:08 AM      Component Value Range   Prothrombin Time 16.6 (*) 11.6 - 15.2 (seconds)   INR 1.32  0.00 - 1.49      Studies/Results: @RISRSLT24 @     . acyclovir ointment   Topical Q3H  . antiseptic oral rinse  1 application Mouth Rinse QID  . carvedilol  12.5 mg Oral BID WC  . chlorhexidine  15 mL Mouth/Throat BID  . digoxin  0.125 mg Oral Daily  . digoxin  0.125 mg Oral Once  . ertapenem  1 g Intravenous Q24H  . furosemide  40 mg Intravenous Once  . insulin aspart  0-20 Units Subcutaneous Q4H  . insulin glargine  15 Units Subcutaneous Daily  . ipratropium  0.5 mg Nebulization Q6H  . latanoprost  1 drop Both Eyes QHS  . levalbuterol  0.63 mg Nebulization Q6H  . losartan  50 mg Oral Daily  . morphine   Intravenous Q4H  . pantoprazole (PROTONIX) IV  40 mg Intravenous Q12H  . warfarin  5 mg Oral ONCE-1800  . DISCONTD: digoxin  0.125 mg Intravenous Daily  . DISCONTD: insulin glargine  10 Units Subcutaneous Daily  . DISCONTD: metoprolol  7.5 mg Intravenous Q4H     Assessment/Plan: s/p Procedure(s): COLON RESECTION    POD #7, sigmoid colectomy with colostomy for perforated diverticulitis. Ileus slowly resolving. We'll advance to full liquids and see how he does. Continue TNA.  Continue Invanz antibiotic.  Open abdominal wound, continue twice a day dressing changes.  History of COPD and acute postoperative respiratory  failure, resolved. Being followed by [place] [facility].  History coronary artery disease, status post CABG, history of MVR with mechanical valve replacement, ischemic  cardiomyopathy, atrial fibrillation with controlled rate.  starting back on Coumadin.  Diabetes mellitus.  On Lantus insulin plus sliding-scale insulin.    Significant deconditioning. Continue PT. May need rehabilitation or SNF.    LOS: 8 days    Duan Scharnhorst M. Derrell Lolling, M.D., [facility] General and Minimally invasive Surgery Breast and Colorectal Surgery Office:   [phone] Pager:   [phone]  01/19/2012  . .prob

## 2012-01-19 NOTE — Consult Note (Signed)
WOC ostomy consult  Stomal assessment/size: Ostomy stoma to left abd red and viable, slightly above skin level, hard to palpation from 3oclock to 6 oclock and this is pushing stoma to point towards midline abd. Area tender to palpation. Peristomal assessment:  Blistering has occurred to left side of outer stoma, appears related to previously placed tape since it is not located anywhere else under ostomy wafer.  Blisters have ruptured and are dry patchy areas of partial thickness skin loss in area approx 3X.2X.1cm to outer abd.  Covered in foam dressing to protect and promote healing. Treatment options for stomal/peristomal skin:  Output  Mod tan semiformed stool in pouch. Ostomy pouching: 1pc pouch applied. Education provided: Wife at bedside to assist.  She is able to empty and close with velcro.  Pt did not participate in pouching activities.  Will continue educational sessions after transferred to floor.  Supplies ordered to bedside for staff use.   Cammie Mcgee, RN, MSN, [employer]  [phone]

## 2012-01-19 NOTE — Progress Notes (Signed)
ANTICOAGULATION CONSULT NOTE - Follow Up Consult  Pharmacy Consult for Heparin Indication: atrial fibrillation and MVR  Allergies  Allergen Reactions  . Codeine     REACTION: hives  . Sulfonamide Derivatives     REACTION: hives  . Tylenol-Codeine     REACTION: hives    Patient Measurements: Height: 5' 4.8" (164.6 cm) Weight: 169 lb 12.1 oz (77 kg) IBW/kg (Calculated) : 61.04   Vital Signs: Temp: 99.3 F (37.4 C) (01/28 0739) Temp src: Oral (01/28 0739) BP: 136/47 mmHg (01/28 0739) Pulse Rate: 83  (01/28 0739)  Labs:  Basename 01/19/12 0408 01/18/12 0453 01/17/12 0500  HGB 9.6* 10.0* --  HCT 28.8* 30.8* 31.1*  PLT 174 173 165  APTT -- -- --  LABPROT 16.6* -- --  INR 1.32 -- --  HEPARINUNFRC 0.42 0.27* 0.40  CREATININE 0.78 0.69 0.70  CKTOTAL -- -- --  CKMB -- -- --  TROPONINI -- -- --   Estimated Creatinine Clearance: 79.6 ml/min (by C-G formula based on Cr of 0.78).   Medications:  Scheduled:     . acyclovir ointment   Topical Q3H  . antiseptic oral rinse  1 application Mouth Rinse QID  . carvedilol  12.5 mg Oral BID WC  . chlorhexidine  15 mL Mouth/Throat BID  . digoxin  0.125 mg Oral Daily  . digoxin  0.125 mg Oral Once  . ertapenem  1 g Intravenous Q24H  . furosemide  40 mg Intravenous Once  . insulin aspart  0-20 Units Subcutaneous Q4H  . insulin glargine  15 Units Subcutaneous Daily  . ipratropium  0.5 mg Nebulization Q6H  . latanoprost  1 drop Both Eyes QHS  . levalbuterol  0.63 mg Nebulization Q6H  . losartan  50 mg Oral Daily  . morphine   Intravenous Q4H  . pantoprazole (PROTONIX) IV  40 mg Intravenous Q12H  . warfarin  5 mg Oral ONCE-1800  . DISCONTD: digoxin  0.125 mg Intravenous Daily  . DISCONTD: metoprolol  7.5 mg Intravenous Q4H   Infusions:     . heparin 1,850 Units/hr (01/18/12 1800)  . lactated ringers 25 mL/hr at 01/16/12 0600  . TPN (CLINIMIX) +/- additives 65 mL/hr at 01/17/12 1753  . TPN (CLINIMIX) +/- additives 65 mL/hr  at 01/18/12 1755    Assessment: 71 y/o male patient receiving heparin infusion for h/o afib with AVR. Heparin level therapeutic this am, no bleeding reported. H/H plt stable. Coumadin resumed yesterday.  Goal of Therapy:  Heparin level 0.3-0.7 units/ml   Plan:  1. Heparin gtt to 1850units/hr (18.5 ml/hr) 2.Coumadin 5mg  PO x1

## 2012-01-19 NOTE — Progress Notes (Signed)
Morphine 21mg  witnessed waste with Gevena Cotton

## 2012-01-19 NOTE — Progress Notes (Signed)
UR Completed.  Ekansh Sherk [name] [phone] 01/19/2012  

## 2012-01-19 NOTE — Progress Notes (Signed)
Pt with Morphine syringe from PCA discontinued, as ordered.  Syringe with 21mg  of morphine wasted. Witnessed by Milana Kidney, RN.   01/19/2012 7:37 PM Bing Matter, Murtis Sink

## 2012-01-20 ENCOUNTER — Inpatient Hospital Stay (HOSPITAL_COMMUNITY): Payer: Medicare Other

## 2012-01-20 DIAGNOSIS — G9340 Encephalopathy, unspecified: Secondary | ICD-10-CM | POA: Diagnosis not present

## 2012-01-20 DIAGNOSIS — D684 Acquired coagulation factor deficiency: Secondary | ICD-10-CM

## 2012-01-20 DIAGNOSIS — I4891 Unspecified atrial fibrillation: Secondary | ICD-10-CM | POA: Diagnosis not present

## 2012-01-20 DIAGNOSIS — K630 Abscess of intestine: Secondary | ICD-10-CM | POA: Diagnosis not present

## 2012-01-20 DIAGNOSIS — R0602 Shortness of breath: Secondary | ICD-10-CM | POA: Diagnosis not present

## 2012-01-20 DIAGNOSIS — J9600 Acute respiratory failure, unspecified whether with hypoxia or hypercapnia: Secondary | ICD-10-CM

## 2012-01-20 DIAGNOSIS — J449 Chronic obstructive pulmonary disease, unspecified: Secondary | ICD-10-CM

## 2012-01-20 DIAGNOSIS — R5381 Other malaise: Secondary | ICD-10-CM

## 2012-01-20 LAB — BASIC METABOLIC PANEL
BUN: 21 mg/dL (ref 6–23)
Calcium: 8.4 mg/dL (ref 8.4–10.5)
Creatinine, Ser: 0.81 mg/dL (ref 0.50–1.35)
GFR calc Af Amer: >90 mL/min (ref >90)
GFR calc non Af Amer: 87 mL/min — ABNORMAL LOW (ref >90)

## 2012-01-20 LAB — GLUCOSE, CAPILLARY
Glucose-Capillary: 184 mg/dL — ABNORMAL HIGH (ref 70–99)
Glucose-Capillary: 161 mg/dL — ABNORMAL HIGH (ref 70–99)
Glucose-Capillary: 184 mg/dL — ABNORMAL HIGH (ref 70–99)

## 2012-01-20 LAB — CBC
MCH: 29.3 pg (ref 26.0–34.0)
MCHC: 33.1 g/dL (ref 30.0–36.0)
Platelets: 189 10*3/uL (ref 150–400)
RBC: 3.07 MIL/uL — ABNORMAL LOW (ref 4.22–5.81)
RDW: 15.2 % (ref 11.5–15.5)

## 2012-01-20 LAB — PROTIME-INR: Prothrombin Time: 18.7 seconds — ABNORMAL HIGH (ref 11.6–15.2)

## 2012-01-20 MED ORDER — PANTOPRAZOLE SODIUM 40 MG PO TBEC
40.0000 mg | DELAYED_RELEASE_TABLET | Freq: Every day | ORAL | Status: DC
Start: 2012-01-21 — End: 2012-01-27
  Administered 2012-01-21 – 2012-01-27 (×7): 40 mg via ORAL
  Filled 2012-01-20 (×7): qty 1

## 2012-01-20 MED ORDER — HYDROMORPHONE HCL 1 MG/ML PO LIQD: 1.0000 mg | ORAL | Status: DC | PRN

## 2012-01-20 MED ORDER — INSULIN REGULAR HUMAN 100 UNIT/ML IJ SOLN
INTRAVENOUS | Status: DC
  Administered 2012-01-20: 17:00:00 via INTRAVENOUS
  Filled 2012-01-20: qty 2000

## 2012-01-20 MED ORDER — HYDROMORPHONE HCL 2 MG PO TABS
1.0000 mg | ORAL_TABLET | ORAL | Status: DC | PRN
  Administered 2012-01-20: 0.5 mg via ORAL
  Administered 2012-01-21: 06:00:00 via ORAL
  Filled 2012-01-20 (×2): qty 1

## 2012-01-20 MED ORDER — FUROSEMIDE 10 MG/ML IJ SOLN
20.0000 mg | Freq: Every day | INTRAMUSCULAR | Status: DC
  Administered 2012-01-20 – 2012-01-21 (×2): 20 mg via INTRAVENOUS
  Filled 2012-01-20 (×3): qty 2

## 2012-01-20 MED ORDER — WARFARIN SODIUM 5 MG PO TABS
5.0000 mg | ORAL_TABLET | Freq: Once | ORAL | Status: AC
  Administered 2012-01-20: 5 mg via ORAL
  Filled 2012-01-20: qty 1

## 2012-01-20 MED ORDER — CARVEDILOL 3.125 MG PO TABS
3.1250 mg | ORAL_TABLET | Freq: Two times a day (BID) | ORAL | Status: DC
  Administered 2012-01-20 – 2012-01-24 (×8): 3.125 mg via ORAL
  Filled 2012-01-20 (×10): qty 1

## 2012-01-20 NOTE — Progress Notes (Signed)
   CARE MANAGEMENT NOTE 01/20/2012  Patient:  [name], [name]   Account Number:  [id number]  Date Initiated:  01/12/2012  Documentation initiated by:  [name]  Subjective/Objective Assessment:   adm w perforated diverticulum  01-11-11 post op on vent.     Action/Plan:   lives w wife, pcp dr Wonda Cheng   Anticipated DC Date:  01/22/2012   Anticipated DC Plan:  IP REHAB FACILITY      DC Planning Services  CM consult      Choice offered to / List presented to:             Status of service:  In process, will continue to follow Medicare Important Message given?   (If response is "NO", the following Medicare IM given date fields will be blank) Date Medicare IM given:   Date Additional Medicare IM given:    Discharge Disposition:    Per UR Regulation:  Reviewed for med. necessity/level of care/duration of stay  Comments:  01/20/12 Billy Forster,RN,BSN 1430 PT RECOMMENDING IP REHAB.  WILL OBTAIN ORDERS FOR OT CONSULT AND REHAB CONSULT.  MET WITH PT AND WIFE TO DISCUSS DC PLANS.  PTA, PT INDEPENDENT, LIVES WITH WIFE.  WIFE AVAILABLE AT DISCHARGE TO PROVIDE 24HR CARE.  PT/WIFE VERY INTERESTED IN CIR.  WILL FOLLOW FOR CONSULT. Phone #[phone]   01-19-12 2:30pm [name], RNBSN - [phone] UR Completed.  01-15-12 1:50pm [name], RNBSN [phone] UR Completed. Now extubated as of 01-14-12  01-13-12 2:30pm [name], RNBSN - [phone] UR completed.  1/21 Billy dowell rn,bsn [phone]

## 2012-01-20 NOTE — Progress Notes (Signed)
ANTICOAGULATION CONSULT NOTE - Follow Up Consult  Pharmacy Consult for Heparin Indication: atrial fibrillation and MVR  Allergies  Allergen Reactions  . Codeine     REACTION: hives  . Sulfonamide Derivatives     REACTION: hives  . Tylenol-Codeine     REACTION: hives    Patient Measurements: Height: 5' 4.8" (164.6 cm) Weight: 166 lb 10.7 oz (75.6 kg) IBW/kg (Calculated) : 61.04   Vital Signs: Temp: 97.8 F (36.6 C) (01/29 0354) Temp src: Oral (01/29 0354) BP: 126/52 mmHg (01/29 0354) Pulse Rate: 79  (01/29 0354)  Labs:  Basename 01/20/12 0418 01/19/12 0408 01/18/12 0453  HGB 9.0* 9.6* --  HCT 27.2* 28.8* 30.8*  PLT 189 174 173  APTT -- -- --  LABPROT 18.7* 16.6* --  INR 1.53* 1.32 --  HEPARINUNFRC 0.40 0.42 0.27*  CREATININE 0.81 0.78 0.69  CKTOTAL -- -- --  CKMB -- -- --  TROPONINI -- -- --   Estimated Creatinine Clearance: 77.9 ml/min (by C-G formula based on Cr of 0.81).   Medications:  Scheduled:     . acyclovir ointment   Topical Q3H  . antiseptic oral rinse  1 application Mouth Rinse QID  . chlorhexidine  15 mL Mouth/Throat BID  . digoxin  0.125 mg Oral Daily  . ertapenem  1 g Intravenous Q24H  . furosemide  20 mg Intravenous Once  . insulin aspart  0-20 Units Subcutaneous Q4H  . insulin glargine  15 Units Subcutaneous Daily  . ipratropium  0.5 mg Nebulization Q6H  . latanoprost  1 drop Both Eyes QHS  . levalbuterol  0.63 mg Nebulization Q6H  . pantoprazole  40 mg Oral BID AC  . warfarin  5 mg Oral ONCE-1800  . DISCONTD: carvedilol  12.5 mg Oral BID WC  . DISCONTD: losartan  50 mg Oral Daily  . DISCONTD: morphine   Intravenous Q4H  . DISCONTD: pantoprazole (PROTONIX) IV  40 mg Intravenous Q12H   Infusions:     . TPN (CLINIMIX) +/- additives 65 mL/hr at 01/19/12 2300   And  . fat emulsion 10 kcal (01/19/12 2300)  . heparin 1,850 Units/hr (01/19/12 2327)  . lactated ringers 20 mL (01/19/12 1043)  . TPN (CLINIMIX) +/- additives 65 mL/hr at  01/18/12 1755    Assessment: 71 y/o male patient receiving heparin infusion for h/o afib with AVR. Heparin level therapeutic this am, no bleeding reported. H/H plt stable. INR slowly trending up after vit k.   Goal of Therapy:  Heparin level 0.3-0.7 units/ml   Plan:  1. Heparin gtt to 1850units/hr (18.5 ml/hr) 2.Coumadin 5mg  PO x1

## 2012-01-20 NOTE — Consult Note (Signed)
Physical Medicine and Rehabilitation Consult Reason for Consult: deconditioning Referring Phsyician: Dr. Tyson Alias    HPI: [name] is an 71 y.o. male with 3 days of abdominal pain. He presented to [facility] last Monday (6-7 days ago) with shortness of breath. He was treated for COPD exacerbation. He was improving with steroid taper from pulmonary standpoint, but was having trouble having a bowel movement. He developed severe abdominal pain Friday and CXR showed free air. The severe pain dissipated rapidly. A CT at that point demonstrated presumably perforated sigmoid diverticulitis. According to the surgeon at [facility], he had minimal LLQ tenderness, normal white count, and no fevers. Patient on chronic coumadin for mechanical valve.  When reviewing all the medical problems and recent COPD exacerbation, the surgeon in conjunction with the pt's input made decision to manage the problem non operatively and patient placed on IV antibiotics for treatment. Family requested transfer to [facility] 01/20 for second opinion. Evaluated by Dr. Donell Beers and CT abdomen with worsening of free air with large abscess and diverticulitis in LLQ.  Coumadin reversed and pt underwent exploratory lap with drainage on intra-abdominal abscess, sigmoid colectomy and colostomy by [name] on 01/21.  Post op on heparin drip.  Followed by CCM for SIRS and post op respiratory failure with encephalopathy. Extubated 01/23. AFib with RVR treated with IV digoxin and BB. On TNA for nutritional support due to post op ileus. NGT discontinued 01/27 and advanced to soft diet today.  Patient continues on invanz and bid dressing changes for open abdominal wound.  Confusion resolving. PT ongoing and patient noted to have slow processing requiring increased time to follow commands.  Patient with decreased balance and noted to be deconditioned.  MD, PT recommending CIR.  Review of Systems  Respiratory: Positive for cough and shortness of  breath.   Cardiovascular: Negative for chest pain.  Gastrointestinal: Positive for abdominal pain (incisional). Negative for nausea and vomiting.  Psychiatric/Behavioral:       Confusion reported.   Past Medical History  Diagnosis Date  . Atrial fibrillation   . Hypertension   . Hyperkalemia   . Acute systolic heart failure   . Coronary artery disease     Cath 2005- patent LIMA-LAD, SVG-OM grafts, moderate MR  . Prostate cancer    Past Surgical History  Procedure Date  . Bypass graft 1989    LIMA-LAD, SVG-OM  . Mitral valve replacement 2007    Mechanical, performed at [facility]  . Hernia repair   . External ear surgery     drum  . Colon resection 01/12/2012    Procedure: COLON RESECTION;  Surgeon: Adolph Pollack, MD;  Location: [facility] OR;  Service: General;  Laterality: N/A;   Family History  Problem Relation Age of Onset  . Coronary artery disease Mother   . Stroke Father   . Heart attack Mother     x10  . Heart attack Brother     with bypass   . Heart attack Sister     with bypass   . Cancer Sister   . Cancer Brother    Social History: Married. Retired but still active.  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 He  reports that he quit smoking about 24 years ago. His smoking use included Cigarettes. He has a 92 pack-year smoking history. He has never used smokeless tobacco. He reports that he does not drink alcohol or use illicit drugs. Wife at home can assist past discharge.  Multiple family members  are nurses and can provide assistance past discharge.   Allergies  Allergen Reactions  . Codeine     REACTION: hives  . Sulfonamide Derivatives     REACTION: hives  . Tylenol-Codeine     REACTION: hives    Prior to Admission medications   Medication Sig Start Date End Date Taking? Authorizing Provider  albuterol-ipratropium (COMBIVENT) 18-103 MCG/ACT inhaler Inhale 2 puffs into the lungs as needed. For shortness of breath   Yes Historical Provider, MD  aspirin EC 81 MG  tablet Take 81 mg by mouth daily.   Yes Historical Provider, MD  budesonide-formoterol (SYMBICORT) 160-4.5 MCG/ACT inhaler Inhale 2 puffs into the lungs 2 (two) times daily.   Yes Historical Provider, MD  carvedilol (COREG) 12.5 MG tablet Take 12.5 mg by mouth 2 (two) times daily with a meal.  07/31/11  Yes Historical Provider, MD  Cholecalciferol (VITAMIN D3) 1000 UNITS CAPS Take 1 capsule by mouth daily.     Yes Historical Provider, MD  digoxin (LANOXIN) 0.125 MG tablet Take 125 mcg by mouth daily.   Yes Historical Provider, MD  furosemide (LASIX) 20 MG tablet Take 20 mg by mouth daily.   Yes Historical Provider, MD  guaiFENesin (MUCINEX) 600 MG 12 hr tablet Take 1,200 mg by mouth at bedtime.   Yes Historical Provider, MD  indomethacin (INDOCIN) 50 MG capsule Take 50 mg by mouth 3 (three) times daily as needed.   Yes Historical Provider, MD  ipratropium-albuterol (DUONEB) 0.5-2.5 (3) MG/3ML SOLN Take 3 mLs by nebulization as needed. For shortness of breath   Yes Historical Provider, MD  losartan (COZAAR) 100 MG tablet Take 100 mg by mouth daily.  07/30/11  Yes Historical Provider, MD  metFORMIN (GLUCOPHAGE) 500 MG tablet Take 500 mg by mouth 2 (two) times daily with a meal.  08/26/11  Yes Historical Provider, MD  nitrofurantoin (MACRODANTIN) 100 MG capsule Take 100 mg by mouth daily.   Yes Historical Provider, MD  OVER THE COUNTER MEDICATION Take 1 tablet by mouth daily. Preservision Eye Vitamins   Yes Historical Provider, MD  simvastatin (ZOCOR) 40 MG tablet Take 40 mg by mouth at bedtime.     Yes Historical Provider, MD  Tamsulosin HCl (FLOMAX) 0.4 MG CAPS Take 0.4 mg by mouth daily.  07/30/11  Yes Historical Provider, MD  warfarin (COUMADIN) 2.5 MG tablet Take 2.5 mg by mouth daily. Take 2.5 mg on Mon, Tues, Thurs,Fri, and Sun. Take 5 mg on Wed and Saturday.   Yes Historical Provider, MD   Scheduled Medications:    . acyclovir ointment   Topical Q3H  . antiseptic oral rinse  1 application Mouth  Rinse QID  . chlorhexidine  15 mL Mouth/Throat BID  . digoxin  0.125 mg Oral Daily  . ertapenem  1 g Intravenous Q24H  . insulin aspart  0-20 Units Subcutaneous Q4H  . insulin glargine  15 Units Subcutaneous Daily  . ipratropium  0.5 mg Nebulization Q6H  . latanoprost  1 drop Both Eyes QHS  . levalbuterol  0.63 mg Nebulization Q6H  . pantoprazole  40 mg Oral BID AC  . warfarin  5 mg Oral ONCE-1800  . warfarin  5 mg Oral ONCE-1800   PRN MED's: acetaminophen, acetaminophen, fentaNYL, HYDROmorphone, lip balm, DISCONTD: HYDROmorphone HCl Home: Home Living Lives With: Spouse Receives Help From: Family (3 nurses in family (2 daughters, son-in-law)) Type of Home: House Home Layout: One level Home Access: Stairs to enter Entrance Stairs-Rails: Right Entrance Stairs-Number of Steps:  4 Bathroom Shower/Tub: Psychologist, counselling;Door [employer] Toilet: Standard Home Adaptive Equipment: Engineer, drilling - rolling  Functional History: Prior Function Level of Independence: Independent with basic ADLs;Independent with homemaking with ambulation;Independent with gait;Independent with transfers Driving: Yes 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) Comments: very active per daughter Functional Status:  Mobility: Bed Mobility Bed Mobility: No (pt sitting upright in recliner on presentation) Rolling Right:  (50%) Rolling Left: 1: +2 Total assist (50%) Rolling Left Details (indicate cue type and reason): +2totalpt50%; pt continues to need sequencing cues and assist to initiate and for follow through; slower processing  Left Sidelying to Sit: 1: +2 Total assist (40%) Left Sidelying to Sit Details (indicate cue type and reason): +2totalpt25% for initiation and follow through; pt c/o pain throughout movement Sitting - Scoot to Edge of Bed: 2: Max assist Sitting - Scoot to [place] of Bed Details (indicate cue type and reason): use of pad to scoot hips reciprocally Sit to Sidelying  Left: 1: +2 Total assist Sit to Sidelying Left Details (indicate cue type and reason): facilitation to guide shoulders down to bed as rehab technician assisted with bring legs bed level; pt holding cardiac pillow tightly Scooting to HOB: 1: +2 Total assist (0%) Transfers Transfers: Yes Sit to Stand: 1: +2 Total assist;With armrests;With upper extremity assist;From chair/3-in-1 Sit to Stand Details (indicate cue type and reason): pt stood from recliner x3 with +2totalpt70%; slow to ascend but improved independence today; cues for sequencing and facilitation for follow through Stand to Sit: 3: Mod assist;To chair/3-in-1;With upper extremity assist;With armrests Stand to Sit Details: cues for slow descent Ambulation/Gait Ambulation/Gait: Yes Ambulation/Gait Assistance: 3: Mod assist;4: Min assist Ambulation/Gait Assistance Details (indicate cue type and reason): amb approx 40 ft x2 with one 3 minute seated rest break; pt limited by fatigue; amb with very slow and gaurded gait as well as flexed posture and shuffled feet; mod-min facilition at hips for more upright posture and continuous cueing for forward gaze; started amb faster towards end of amb.  Ambulation Distance (Feet): 80 Feet (40 x2) Assistive device: Rolling walker Gait Pattern: Trunk flexed;Shuffle;Decreased stride length;Decreased step length - left;Decreased step length - right Stairs: No    ADL:    Cognition: Cognition Orientation Level: Oriented to person;Oriented to situation;Disoriented to place Cognition Orientation Level: Oriented to person;Oriented to situation;Disoriented to place  Blood pressure 127/68, pulse 95, temperature 98.2 F (36.8 C), temperature source Oral, resp. rate 29, height 5' 4.8" (1.646 m), weight 75.6 kg (166 lb 10.7 oz), SpO2 95.00%. Physical Exam  Constitutional: He is well-developed, well-nourished, and in no distress.  HENT:  Head: Normocephalic and atraumatic.  Eyes: Pupils are equal, round,  and reactive to light.  Neck: Normal range of motion. Neck supple.  Cardiovascular: Normal rate.  An irregularly irregular rhythm present.  Pulmonary/Chest: Effort normal. He has decreased breath sounds in the right lower field and the left lower field. He has rhonchi in the right lower field, the left middle field and the left lower field.  Abdominal: Soft. There is tenderness.       Dry dressing on midline incision.  Colostomy patent.   Neurological: He is alert. He is disoriented.       Delayed processing.   Psychiatric: He has a flat affect.    Results for orders placed during the hospital encounter of 01/11/12 (from the past 24 hour(s))  GLUCOSE, CAPILLARY     Status: Abnormal   Collection Time   01/19/12  4:37  PM      Component Value Range   Glucose-Capillary 193 (*) 70 - 99 (mg/dL)   Comment 1 Notify RN    GLUCOSE, CAPILLARY     Status: Abnormal   Collection Time   01/19/12  9:10 PM      Component Value Range   Glucose-Capillary 221 (*) 70 - 99 (mg/dL)   Comment 1 Documented in Chart     Comment 2 Notify RN    GLUCOSE, CAPILLARY     Status: Abnormal   Collection Time   01/20/12 12:03 AM      Component Value Range   Glucose-Capillary 184 (*) 70 - 99 (mg/dL)   Comment 1 Documented in Chart     Comment 2 Notify RN    GLUCOSE, CAPILLARY     Status: Abnormal   Collection Time   01/20/12  3:54 AM      Component Value Range   Glucose-Capillary 161 (*) 70 - 99 (mg/dL)   Comment 1 Documented in Chart     Comment 2 Notify RN    HEPARIN LEVEL (UNFRACTIONATED)     Status: Normal   Collection Time   01/20/12  4:18 AM      Component Value Range   Heparin Unfractionated 0.40  0.30 - 0.70 (IU/mL)  CBC     Status: Abnormal   Collection Time   01/20/12  4:18 AM      Component Value Range   WBC 6.7  4.0 - 10.5 (K/uL)   RBC 3.07 (*) 4.22 - 5.81 (MIL/uL)   Hemoglobin 9.0 (*) 13.0 - 17.0 (g/dL)   HCT 16.1 (*) 09.6 - 52.0 (%)   MCV 88.6  78.0 - 100.0 (fL)   MCH 29.3  26.0 - 34.0 (pg)     MCHC 33.1  30.0 - 36.0 (g/dL)   RDW 04.5  40.9 - 81.1 (%)   Platelets 189  150 - 400 (K/uL)  PROTIME-INR     Status: Abnormal   Collection Time   01/20/12  4:18 AM      Component Value Range   Prothrombin Time 18.7 (*) 11.6 - 15.2 (seconds)   INR 1.53 (*) 0.00 - 1.49   BASIC METABOLIC PANEL     Status: Abnormal   Collection Time   01/20/12  4:18 AM      Component Value Range   Sodium 138  135 - 145 (mEq/L)   Potassium 3.8  3.5 - 5.1 (mEq/L)   Chloride 103  96 - 112 (mEq/L)   CO2 29  19 - 32 (mEq/L)   Glucose, Bld 167 (*) 70 - 99 (mg/dL)   BUN 21  6 - 23 (mg/dL)   Creatinine, Ser 9.14  0.50 - 1.35 (mg/dL)   Calcium 8.4  8.4 - 78.2 (mg/dL)   GFR calc non Af Amer 87 (*) >90 (mL/min)   GFR calc Af Amer >90  >90 (mL/min)  PHOSPHORUS     Status: Normal   Collection Time   01/20/12  4:18 AM      Component Value Range   Phosphorus 3.6  2.3 - 4.6 (mg/dL)  MAGNESIUM     Status: Normal   Collection Time   01/20/12  4:18 AM      Component Value Range   Magnesium 1.9  1.5 - 2.5 (mg/dL)  GLUCOSE, CAPILLARY     Status: Abnormal   Collection Time   01/20/12  7:53 AM      Component Value Range   Glucose-Capillary 182 (*)  70 - 99 (mg/dL)   Comment 1 Notify RN    GLUCOSE, CAPILLARY     Status: Abnormal   Collection Time   01/20/12 12:03 PM      Component Value Range   Glucose-Capillary 190 (*) 70 - 99 (mg/dL)   Comment 1 STAT Lab     Dg Chest Port 1 View  01/20/2012  *RADIOLOGY REPORT*  Clinical Data: Shortness of breath  PORTABLE CHEST - 1 VIEW  Comparison: January 17, 2012.  Findings: Sternotomy wires are noted.  Nasogastric tube has been removed.  No change is noted in position of right internal jugular catheter.  No acute pulmonary disease is noted.  IMPRESSION: No acute cardiopulmonary abnormality seen.  Original Report Authenticated By: Venita Sheffield., M.D.    Assessment/Plan: Diagnosis: Deconditioning after abdominal abscess and expiratory laparotomy/colectomy with associated  respiratory failure 1. Does the need for close, 24 hr/day medical supervision in concert with the patient's rehab needs make it unreasonable for this patient to be served in a less intensive setting? Yes 2. Co-Morbidities requiring supervision/potential complications: Septic shock/atrial fibrillation/COPD/CHF 3. Due to bladder management, bowel management, safety, skin/wound care, disease management, medication administration, pain management and patient education, does the patient require 24 hr/day rehab nursing? Yes 4. Does the patient require coordinated care of a physician, rehab nurse, PT (1-2 hrs/day, 5 days/week) and OT (1-2 hrs/day, 5 days/week) to address physical and functional deficits in the context of the above medical diagnosis(es)? Yes Addressing deficits in the following areas: balance, endurance, locomotion, strength, transferring, bowel/bladder control, bathing, dressing, grooming, toileting, cognition and psychosocial support 5. Can the patient actively participate in an intensive therapy program of at least 3 hrs of therapy per day at least 5 days per week? Yes 6. The potential for patient to make measurable gains while on inpatient rehab is excellent 7. Anticipated functional outcomes upon discharge from inpatients are supervision to modified PT, supervision to modified independent OT 8. Estimated rehab length of stay to reach the above functional goals is: 2 weeks 9. Does the patient have adequate social supports to accommodate these discharge functional goals? Yes 10. Anticipated D/C setting: Home 11. Anticipated post D/C treatments: HH therapy 12. Overall Rehab/Functional Prognosis: excellent  RECOMMENDATIONS: This patient's condition is appropriate for continued rehabilitative care in the following setting: CIR Patient has agreed to participate in recommended program. Yes Note that insurance prior authorization may be required for reimbursement for recommended  care.  Comment: We'll follow along for medical stability. Discussed with patient and wife.   Ivory Broad, MD 01/20/2012

## 2012-01-20 NOTE — Progress Notes (Signed)
Subjective:  Stable status.  His wife thinks he is doing some better.  No chest pain.  Rate is controlled.  Anticoagulation with hep level 0.4 and INR about 1.6 at this point.  Moved about some and she would like him to go to rehab.    Objective:  Vital Signs in the last 24 hours: Temp:  [97.2 F (36.2 C)-98.2 F (36.8 C)] 98.2 F (36.8 C) (01/29 1215) Pulse Rate:  [79-97] 97  (01/29 1600) Resp:  [29-38] 38  (01/29 1600) BP: (116-145)/(37-68) 121/49 mmHg (01/29 1600) SpO2:  [92 %-95 %] 93 % (01/29 1600) Weight:  [75.6 kg (166 lb 10.7 oz)] 75.6 kg (166 lb 10.7 oz) (01/29 0005)  Intake/Output from previous day: 01/28 0701 - 01/29 0700 In: 3114 [P.O.:880; I.V.:824; IV Piggyback:50; TPN:1285] Out: 1600 [Urine:1600]   Physical Exam: General: chronically ill appearing.  Head:  Normocephalic and atraumatic. Lungs: some ronchii Heart: irregularly irregular rhythm. Crisp VS.    Pulses: Pulses decrease slighlty Extremities: No clubbing or cyanosis. No edema. Neurologic: Alert and oriented x 3.    Lab Results:  Basename 01/20/12 0418 01/19/12 0408  WBC 6.7 7.2  HGB 9.0* 9.6*  PLT 189 174    Basename 01/20/12 0418 01/19/12 0408  NA 138 136  K 3.8 3.6  CL 103 100  CO2 29 31  GLUCOSE 167* 169*  BUN 21 20  CREATININE 0.81 0.78   No results found for this basename: TROPONINI:2,CK,MB:2 in the last 72 hours Hepatic Function Panel  Basename 01/19/12 0408  PROT 4.8*  ALBUMIN 1.7*  AST 19  ALT 10  ALKPHOS 32*  BILITOT 0.3  BILIDIR --  IBILI --   No results found for this basename: CHOL in the last 72 hours No results found for this basename: PROTIME in the last 72 hours  Imaging: Dg Chest Port 1 View  01/20/2012  *RADIOLOGY REPORT*  Clinical Data: Shortness of breath  PORTABLE CHEST - 1 VIEW  Comparison: January 17, 2012.  Findings: Sternotomy wires are noted.  Nasogastric tube has been removed.  No change is noted in position of right internal jugular catheter.  No acute  pulmonary disease is noted.  IMPRESSION: No acute cardiopulmonary abnormality seen.  Original Report Authenticated By: Venita Sheffield., M.D.     Assessment/Plan:  Patient Active Hospital Problem List: Diverticulitis of large intestine with perforation s/p sigmoid colectomy and colostomy 01/12/12 (01/12/2012)   Assessment: Now on TPN so I and O are pos   Plan: monitor I/0--about 1500cc yesterday Atrial fibrillation (05/01/2009)   Assessment: controlled VR   Plan: continue current meds SP MVR   Watching anticoagulation closely.  Anemia slightly worse.  Continue to monitor.        Shawnie Pons, MD, [facility], FSCAI 01/20/2012, 4:53 PM

## 2012-01-20 NOTE — Progress Notes (Signed)
Physical Therapy Treatment Patient Details Name: [name] MRN: [id number] DOB: January 10, 1941 Today's Date: 01/20/2012  PT Assessment/Plan  PT - Assessment/Plan Comments on Treatment Session: Discussed CIR with daughter and she seems interested. Spoke with RN and left note that we need orders for OT and CIR consult! Will give hand out for general exercises for family and pt to perform on their own.  PT Plan: Discharge plan remains appropriate;Frequency remains appropriate PT Goals  Acute Rehab PT Goals PT Goal Formulation: With patient PT Goal: Sit to Stand - Progress: Progressing toward goal PT Goal: Stand to Sit - Progress: Progressing toward goal PT Transfer Goal: Bed to Chair/Chair to Bed - Progress: Progressing toward goal PT Goal: Ambulate - Progress: Progressing toward goal  PT Treatment Precautions/Restrictions  Precautions Precautions: Fall Required Braces or Orthoses: No Restrictions Weight Bearing Restrictions: No Mobility (including Balance) Bed Mobility Bed Mobility: No (pt sitting upright in recliner on presentation) Transfers Sit to Stand: 1: +2 Total assist;With armrests;With upper extremity assist;From chair/3-in-1 Sit to Stand Details (indicate cue type and reason): pt stood from recliner x3 with +2totalpt70%; slow to ascend but improved independence today; cues for sequencing and facilitation for follow through Stand to Sit: 3: Mod assist;To chair/3-in-1;With upper extremity assist;With armrests Stand to Sit Details: cues for slow descent Ambulation/Gait Ambulation/Gait Assistance: 3: Mod assist;4: Min assist Ambulation/Gait Assistance Details (indicate cue type and reason): amb approx 40 ft x2 with one 3 minute seated rest break; pt limited by fatigue; amb with very slow and gaurded gait as well as flexed posture and shuffled feet; mod-min facilition at hips for more upright posture and continuous cueing for forward gaze; started amb faster towards end of amb.    Ambulation Distance (Feet): 80 Feet (40 x2) Assistive device: Rolling walker Gait Pattern: Trunk flexed;Shuffle;Decreased stride length;Decreased step length - left;Decreased step length - right  Posture/Postural Control Postural Limitations: forward had and rounded shoulders; ed pt and daughter about upright posture, even taking time to work on it just in the chair; ed on scapular squeezes for HEP and pt needing quite a bit of cueing (tactile, visual and descriptive) to understand and process/perform this exercise Static Standing Balance Static Standing - Balance Support: Bilateral upper extremity supported Static Standing - Level of Assistance: 4: Min assist Exercise    End of Session PT - End of Session Equipment Utilized During Treatment: Gait belt Activity Tolerance: Patient tolerated treatment well;Patient limited by fatigue (needs a lot of encouragement but willing) Patient left: in chair Nurse Communication: Mobility status for transfers;Mobility status for ambulation General Behavior During Session: [facility] for tasks performed Cognition: Impaired Cognitive Impairment: continues to be slower with processing/completing more complicated tasks  [facility] HELEN 01/20/2012, 12:40 PM

## 2012-01-20 NOTE — Progress Notes (Signed)
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 Codeine     REACTION: hives  . Sulfonamide Derivatives     REACTION: hives  . Tylenol-Codeine     REACTION: hives    Patient Measurements: Height: 5' 4.8" (164.6 cm) Weight: 166 lb 10.7 oz (75.6 kg) IBW/kg (Calculated) : 61.04  Adjusted Body Weight: 65 Kg   Vital Signs: Temp: 97.2 F (36.2 C) (01/29 0814) Temp src: Oral (01/29 0814) BP: 145/53 mmHg (01/29 0814) Pulse Rate: 86  (01/29 0814) Intake/Output from previous day: 01/28 0701 - 01/29 0700 In: 2780 [P.O.:880; I.V.:750; IV Piggyback:50; TPN:1025] Out: 1600 [Urine:1600] Intake/Output from this shift: Total I/O In: -  Out: 182 [Urine:182]  Labs:  [facility] 01/20/12 0418 01/19/12 0408 01/18/12 0453  WBC 6.7 7.2 7.5  HGB 9.0* 9.6* 10.0*  HCT 27.2* 28.8* 30.8*  PLT 189 174 173  APTT -- -- --  INR 1.53* 1.32 --     Basename 01/20/12 0418 01/19/12 0408 01/18/12 0453  NA 138 136 138  K 3.8 3.6 4.0  CL 103 100 104  CO2 29 31 27   GLUCOSE 167* 169* 207*  BUN 21 20 17   CREATININE 0.81 0.78 0.69  LABCREA -- -- --  CREAT24HRUR -- -- --  CALCIUM 8.4 8.1* 8.1*  MG 1.9 1.8 1.9  PHOS 3.6 4.2 3.2  PROT -- 4.8* --  ALBUMIN -- 1.7* --  AST -- 19 --  ALT -- 10 --  ALKPHOS -- 32* --  BILITOT -- 0.3 --  BILIDIR -- -- --  IBILI -- -- --  PREALBUMIN -- -- --  TRIG -- -- --  CHOLHDL -- -- --  CHOL -- -- --   Estimated Creatinine Clearance: 77.9 ml/min (by C-G formula based on Cr of 0.81).    Basename 01/20/12 0753 01/20/12 0354 01/20/12 0003  GLUCAP 182* 161* 184*    Insulin Requirements in the past 12 hours:  27 units SSI, Lantus 15 units, TPN bag contains 50 units regular insulin  Current Nutrition:  Full liquid diet.  Nutritional Goals per RD:  1900-2100 kCal, 100-110 grams of protein per day; Currently receiving average  1313kcal and 78g protein daily + full liquid diet.  Assessment: Diverticulitis of large intestine with perforation and abdominal abscess. s/p sig colectomy and colostomy 1/21.   GI Prolonged ileus. PO intake 880cc/24h. Full liquids ordered. Stool and flatus noted by MD. MD notes patient feeling terrible today with abdominal pain and tenderness. Likely will not advance diet today.  Endo h/o DM (on metformin at home) .Lantus started 15uts/hd. CBGs 161-221 (27 units SSI/24h).    Lytes/Renal Lytes WNL today   Hepatobil LFTs WNL, Chol 108 WNL, trig slightly high 176with hx of HLD. Prealb 6.9 low, expected post-op and inflammation  Pulm/Neuro H/o COPD:  on atrovent, levalbuterol  Cardiology h/o AFIB/MVR/HF/CAD/OHS/HTN: Digoxin and heparin gtt/Coumadin. VSS. (home meds not resumed: statin/lasix)  ID Intra abd abscess  on Ertapenem. No cx pending. MRSA PCR neg. Tmax 99.3. WBC 6.7  Px PO PPI BID and IV heparin/Coumadin (hx afib, MVR)     Plan:  1. Continue Clinimix E 5/15 at 14ml/hr. Will not advance due to full liquid diet. 2. MWF supplementation of lipids (28ml/hr), MVI and trace elements d/t national shortages. 3. Increase insulin in TPN 4. No likely advancement in diet today.

## 2012-01-20 NOTE — Progress Notes (Signed)
8 Days Post-Op  Subjective: He feels terrible.  He has a good deal of pain with abdomen, still quite tender to palpation, getting up and down makes it worse.  Pain also just lying still.  He does better once he's up.His confusion is a bit better, but family says he was hallucinating earlier. Sat's good on Room air, 95-96%, His daughter said he desated some in night while asleep. On full liquids, with stool and gas is ostomy bag   Objective: Vital signs in last 24 hours: Temp:  [97.8 F (36.6 C)-98.5 F (36.9 C)] 97.8 F (36.6 C) (01/29 0354) Pulse Rate:  [79-110] 79  (01/29 0354) Resp:  [22-34] 34  (01/29 0354) BP: (93-126)/(37-67) 126/52 mmHg (01/29 0354) SpO2:  [92 %-99 %] 95 % (01/29 0814) FiO2 (%):  [28 %] 28 % (01/28 1520) Weight:  [75.6 kg (166 lb 10.7 oz)] 75.6 kg (166 lb 10.7 oz) (01/29 0005) Last BM Date: 01/19/12  Intake/Output from previous day: 01/28 0701 - 01/29 0700 In: 2780 [P.O.:880; I.V.:750; IV Piggyback:50; TPN:1025] Out: 1600 [Urine:1600] Intake/Output this shift:    PE: Alert, says he feels bad, but fairly clear currently. Fever blister, with crusting Right lower lip.  Chest, some wheezing, getting neb now.  Sats good on R/Al.  Abd: Still slightly distended, very tender, pain is a big issue. His ostomy was seen and teaching done yesterday, and she describes an area 3oclock to 6 Oclock that is somewhat abnormal. I can't tell anything now it's all covered with stool. He also has some skin blistering which isn't helping either.  Foley still in, he was self cathing daily at home, with a history of Prostate Ca, and cryosurgery.  Lab Results:   [facility] 01/20/12 0418 01/19/12 0408  WBC 6.7 7.2  HGB 9.0* 9.6*  HCT 27.2* 28.8*  PLT 189 174    Lab 01/19/12 0408 01/17/12 0500  AST 19 10  ALT 10 6  ALKPHOS 32* 31*  BILITOT 0.3 0.3  PROT 4.8* 4.8*  ALBUMIN 1.7* 1.8*    BMET  Basename 01/20/12 0418 01/19/12 0408  NA 138 136  K 3.8 3.6  CL 103 100  CO2 29 31    GLUCOSE 167* 169*  BUN 21 20  CREATININE 0.81 0.78  CALCIUM 8.4 8.1*   PT/INR  Basename 01/20/12 0418 01/19/12 0408  LABPROT 18.7* 16.6*  INR 1.53* 1.32     Studies/Results: Dg Chest Port 1 View  01/20/2012  *RADIOLOGY REPORT*  Clinical Data: Shortness of breath  PORTABLE CHEST - 1 VIEW  Comparison: January 17, 2012.  Findings: Sternotomy wires are noted.  Nasogastric tube has been removed.  No change is noted in position of right internal jugular catheter.  No acute pulmonary disease is noted.  IMPRESSION: No acute cardiopulmonary abnormality seen.  Original Report Authenticated By: Venita Sheffield., M.D.    Anti-infectives: Anti-infectives     Start     Dose/Rate Route Frequency Ordered Stop   01/12/12 2200   ertapenem (INVANZ) 1 g in sodium chloride 0.9 % 50 mL IVPB  Status:  Discontinued        1 g 100 mL/hr over 30 Minutes Intravenous Every 24 hours 01/12/12 2106 01/12/12 2115   01/12/12 1800   ciprofloxacin (CIPRO) IVPB 400 mg  Status:  Discontinued        400 mg 200 mL/hr over 60 Minutes Intravenous Every 12 hours 01/12/12 0102 01/12/12 1018   01/12/12 1400   metroNIDAZOLE (FLAGYL) IVPB 500  mg  Status:  Discontinued        500 mg 100 mL/hr over 60 Minutes Intravenous Every 8 hours 01/12/12 0102 01/12/12 1018   01/12/12 1030   ertapenem (INVANZ) 1 g in sodium chloride 0.9 % 50 mL IVPB        1 g 100 mL/hr over 30 Minutes Intravenous Every 24 hours 01/12/12 1018     01/12/12 0115   ciprofloxacin (CIPRO) IVPB 400 mg        400 mg 200 mL/hr over 60 Minutes Intravenous  Once 01/12/12 0105 01/12/12 0613   01/12/12 0115   metroNIDAZOLE (FLAGYL) IVPB 500 mg        500 mg 100 mL/hr over 60 Minutes Intravenous  Once 01/12/12 0105 01/12/12 0444         Current Facility-Administered Medications  Medication Dose Route Frequency Provider Last Rate Last Dose  . acetaminophen (TYLENOL) suppository 650 mg  650 mg Rectal Q4H PRN Lonia Farber, MD   650 mg at  01/13/12 1204  . acetaminophen (TYLENOL) tablet 650 mg  650 mg Oral Q6H PRN Nada Boozer. Harris, MD   650 mg at 01/18/12 1540  . acyclovir ointment (ZOVIRAX) 5 %   Topical Q3H Shan Levans, MD      . antiseptic oral rinse (BIOTENE) solution 15 mL  1 application Mouth Rinse QID Lonia Farber, MD   15 mL at 01/19/12 1700  . chlorhexidine (PERIDEX) 0.12 % solution 15 mL  15 mL Mouth/Throat BID Lonia Farber, MD   15 mL at 01/19/12 2205  . digoxin (LANOXIN) tablet 0.125 mg  0.125 mg Oral Daily [name], MD   0.125 mg at 01/19/12 1046  . ertapenem (INVANZ) 1 g in sodium chloride 0.9 % 50 mL IVPB  1 g Intravenous Q24H Sherrie George, PA   1 g at 01/19/12 1043  . tpn solution (CLINIMIX E 5/15) 2,000 mL with multivitamins adult 10 mL, trace elements Cr-Cu-Mn-Se-Zn 1 mL, insulin regular 50 Units infusion   Intravenous Continuous TPN Crystal Stillinger Byram, PHARMD 65 mL/hr at 01/19/12 2300     And  . fat emulsion 20 % infusion 250 mL  250 mL Intravenous Continuous TPN Crystal Stillinger Robertson, PHARMD 10 mL/hr at 01/19/12 2300 10 kcal at 01/19/12 2300  . fentaNYL (SUBLIMAZE) injection 12.5 mcg  12.5 mcg Intravenous Q2H PRN [name]. Tyson Alias, MD   12.5 mcg at 01/20/12 0724  . furosemide (LASIX) injection 20 mg  20 mg Intravenous Once Shawnie Pons, MD   20 mg at 01/19/12 1045  . heparin ADULT infusion 100 units/ml (25000 units/250 ml)  1,850 Units/hr Intravenous Continuous Santina Evans, PHARMD 18.5 mL/hr at 01/19/12 2327 1,850 Units/hr at 01/19/12 2327  . insulin aspart (novoLOG) injection 0-20 Units  0-20 Units Subcutaneous Q4H Rakesh V. Vassie Loll, MD   4 Units at 01/20/12 0413  . insulin glargine (LANTUS) injection 15 Units  15 Units Subcutaneous Daily Christella Hartigan, PHARMD   15 Units at 01/19/12 1045  . ipratropium (ATROVENT) nebulizer solution 0.5 mg  0.5 mg Nebulization Q6H Shan Levans, MD   0.5 mg at 01/20/12 0814  . lactated ringers infusion   Intravenous  Continuous Lonia Farber, MD 20 mL/hr at 01/19/12 1043 20 mL at 01/19/12 1043  . latanoprost (XALATAN) 0.005 % ophthalmic solution 1 drop  1 drop Both Eyes QHS Ron Parker, MD   1 drop at 01/19/12 2205  . levalbuterol (XOPENEX) nebulizer solution 0.63 mg  0.63 mg Nebulization Q6H Luisa Hart  Delford Field, MD   0.63 mg at 01/20/12 0814  . lip balm (BLISTEX) ointment   Topical PRN Shan Levans, MD      . pantoprazole (PROTONIX) EC tablet 40 mg  40 mg Oral BID AC Crystal Gantt, PHARMD   40 mg at 01/19/12 1738  . tpn solution (CLINIMIX E 5/15) 2,000 mL with insulin regular 35 Units infusion   Intravenous Continuous TPN Christella Hartigan, PHARMD 65 mL/hr at 01/18/12 1755    . warfarin (COUMADIN) tablet 5 mg  5 mg Oral ONCE-1800 Lonia Farber, MD   5 mg at 01/19/12 1738  . DISCONTD: carvedilol (COREG) tablet 12.5 mg  12.5 mg Oral BID WC [name], MD   12.5 mg at 01/19/12 1610  . DISCONTD: diphenhydrAMINE (BENADRYL) 12.5 MG/5ML elixir 12.5 mg  12.5 mg Oral Q6H PRN Adolph Pollack, MD      . DISCONTD: diphenhydrAMINE (BENADRYL) injection 12.5 mg  12.5 mg Intravenous Q6H PRN Adolph Pollack, MD      . DISCONTD: losartan (COZAAR) tablet 50 mg  50 mg Oral Daily [name], MD   50 mg at 01/19/12 1045  . DISCONTD: morphine 1 MG/ML PCA injection   Intravenous Q4H Adolph Pollack, MD   1.5 mg at 01/19/12 1200  . DISCONTD: naloxone (NARCAN) injection 0.4 mg  0.4 mg Intravenous PRN Adolph Pollack, MD      . DISCONTD: ondansetron [facility]) injection 4 mg  4 mg Intravenous Q6H PRN Adolph Pollack, MD      . DISCONTD: pantoprazole (PROTONIX) injection 40 mg  40 mg Intravenous Q12H Lonia Farber, MD   40 mg at 01/18/12 2320  . DISCONTD: sodium chloride 0.9 % injection 9 mL  9 mL Intravenous PRN Adolph Pollack, MD   10 mL at 01/19/12 1122    Assessment/Plan 1. Perforated diverticulitis, with intraabdominal abscess. 2. Peritonitis 3. COPD/Post op VDRF,  now resolved. 4.CAD, S/P CABG/Mitral valve replacement/2005, +MR, Afib on Coumadin/Hx. SCHF 5. Hypertension 6.Prostate cancer, self-cathing at home. 7.PCM/TNA 8. GERD 9.Acyclovir   Plan:  He's on full liquids with good stool output, still has peritonitis.  Try adding some oral pain med, he has allergy to tyleno-codiene listed. Work on mobilizing more.  OT/PT.    LOS: 9 days    Antonie Borjon 01/20/2012

## 2012-01-20 NOTE — Progress Notes (Signed)
Pt refuses to sit in chair at this time, pt repositioned in bed. Will continue to monitor pt. Adria Dill RN

## 2012-01-20 NOTE — Progress Notes (Signed)
Name: [name] MRN: [id number] DOB: October 15, 1941    LOS: 9  PCCM PROGRESS  NOTE  History of Present Illness:  Admitted to [facility] with COPD exacerbation on 1/14.  Developed abdominal pain.  CT scan on 1/18 demonstrated diverticulitis and free air.  Started on antibiotics.  Transferred to [facility] on 1/20.  Repeat  CT scan demonstrated pneumoperitoneum and developing abscess.  Brought to OR on 1/21 for exploratory laparotomy, drainage of intraabdominal abscess, sigmoid colectomy and colostomy.  Lines / Drains: 1/21  ETT>>1/23 1/21  NGT>>out 1/21  Foley>> 1/21  R IJ TLC>>> (TPN) 1/21  JP drain>>  Cultures: None  Antibiotics: 1/18  Cipro>>> 1/21 1/18  Flagyl (abd abscess)>>1/21 1/21  Ertapenem (abd abscess)>>>plan stop 8 days  Tests / Events: 1/20  Abdominal CT>>>pneumoperitoneum / intraabdominal abscess 1/21  Exploratory laparotomy, drainage of intraabdominal abscess, sigmoid colectomy and colostomy  SUBJ: Tolerating diet More awake, no distress, HR WNL, afebrile  Vital Signs: Temp:  [97.2 F (36.2 C)-98.2 F (36.8 C)] 98.2 F (36.8 C) (01/29 1215) Pulse Rate:  [79-95] 95  (01/29 1215) Resp:  [29-34] 29  (01/29 1215) BP: (116-145)/(37-68) 127/68 mmHg (01/29 1215) SpO2:  [92 %-95 %] 94 % (01/29 1436) Weight:  [75.6 kg (166 lb 10.7 oz)] 75.6 kg (166 lb 10.7 oz) (01/29 0005) I/O last 3 completed shifts: In: 4356 [P.O.:880; I.V.:1286; Other:75; IV Piggyback:50] Out: 2580 [Urine:2450; Drains:80; Stool:50]  Physical Examination: General:  Awake and alert, no distress Neuro: awake and alert, interactive, non focal HEENT:  NGT out Neck:  No JVD   Cardiovascular: Irregularly irregular rate, no murmurs, rate wnl Lungs:   scattered rhonchi Abdomen:  Viable colostomy, surgically dressing clean / dry / intact Musculoskeletal: No edema Skin:  No rash      Labs and Imaging:   Dg Chest Port 1 View  01/20/2012  *RADIOLOGY REPORT*  Clinical Data: Shortness of breath   PORTABLE CHEST - 1 VIEW  Comparison: January 17, 2012.  Findings: Sternotomy wires are noted.  Nasogastric tube has been removed.  No change is noted in position of right internal jugular catheter.  No acute pulmonary disease is noted.  IMPRESSION: No acute cardiopulmonary abnormality seen.  Original Report Authenticated By: Venita Sheffield., M.D.    Lab 01/20/12 0418 01/19/12 0408 01/18/12 0453  NA 138 136 138  K 3.8 3.6 4.0  CL 103 100 104  CO2 29 31 27   BUN 21 20 17   CREATININE 0.81 0.78 0.69  GLUCOSE 167* 169* 207*    Lab 01/20/12 0418 01/19/12 0408 01/18/12 0453  HGB 9.0* 9.6* 10.0*  HCT 27.2* 28.8* 30.8*  WBC 6.7 7.2 7.5  PLT 189 174 173     Assessment and Plan:  Intraabdominal abscess, s/p eploratory laparotomy, drainage of intraabdominal abscess, sigmoid colectomy and colostomy.  No signs of sepsis.  Lab 01/20/12 0418 01/19/12 0408 01/18/12 0453 01/17/12 0500 01/16/12 0400  WBC 6.7 7.2 7.5 7.1 7.6   -->  Antibiotics to continue with Invanz monotherapy ---> clinically well, stop date added 31st, total 10 days  Post-operative respiratory failure RESOLVED.  History of COPD.   No results found for this basename: PHART:5,PCO2:5,PCO2ART:5,PO2ART:5 in the last 168 hours Resolving  Lasix to even balance, pcxr reviewed IS  History of CAD s/p CABG.  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  Ischemic cardiomyopathy.  Atrial fibrillation.  No signs of active ischemia.  Atrial fibrillation with controlled rate.  Dyslipidemia.  Hep drip noted Low BP  this am , dc ACEi, ARB, coreg Add fent Rate wnl Dig level, would max this first Likely need to restart low dose coreg  Hematemesis is on the record but no active hematemesis noted and no bloody discharge from NG.  This is on the background of Coumadin, ASA and Indocin.  Stable hemoglobin.  Lab 01/20/12 0418 01/19/12 0408 01/18/12 0453 01/17/12 0500 01/16/12 0400  HCT 27.2* 28.8* 30.8* 31.1* 32.5*   -->  Protonix to  qday, increase risk cdiff Cbc in am   Coumadin induced coagulopathy, s/p FFP / Vit K  Lab 01/20/12 0418 01/19/12 0408  INR 1.53* 1.32   -->  Heparin per pharmacy as above  Diabetes / hyperglycemia  Lab 01/20/12 1203 01/20/12 0753 01/20/12 0354 01/20/12 0003 01/19/12 2110  GLUCAP 190* 182* 161* 184* 221*   -->  SSI HG protocol Add lantus 10 Overall controlled, last 190 however May need lantus increase  Protein Calorie Malnutrition Severe -->On TPN, if tolerates diet, want to dc tpn and dc line fam updated daily, wife  [name]. Tyson Alias, MD, FACP Pgr: [phone] [facility]

## 2012-01-20 NOTE — Progress Notes (Signed)
Patient seen and examined. Much less pain at this hour, sitting in chair. Mild confusion but no aggitation.  Abdomen:  Soft, appropriately tender, not distended, lots of stool and air in bag. Wound open, clean.     POD #8, sigmoid colectomy with colostomy for perforated diverticulitis. Ileus slowly resolving. We'll advance to low residue diet and see how he does. Continue TNA. Continue Invanz antibiotic.   Open abdominal wound, continue twice a day dressing changes.   History of COPD and acute postoperative respiratory failure, resolved. Being followed by [place] [facility].   History coronary artery disease, status post CABG, history of MVR with mechanical valve replacement, ischemic cardiomyopathy, atrial fibrillation with controlled rate. starting back on Coumadin.   Diabetes mellitus. On Lantus insulin plus sliding-scale insulin.   Significant deconditioning. Continue PT. May need rehabilitation or SNF.   Angelia Mould. Derrell Lolling, M.D., [facility] General and Minimally invasive Surgery Breast and Colorectal Surgery Office:   [phone] Pager:   [phone]

## 2012-01-21 ENCOUNTER — Inpatient Hospital Stay (HOSPITAL_COMMUNITY): Payer: Medicare Other

## 2012-01-21 DIAGNOSIS — K5732 Diverticulitis of large intestine without perforation or abscess without bleeding: Secondary | ICD-10-CM | POA: Diagnosis not present

## 2012-01-21 DIAGNOSIS — R0602 Shortness of breath: Secondary | ICD-10-CM | POA: Diagnosis not present

## 2012-01-21 DIAGNOSIS — I4891 Unspecified atrial fibrillation: Secondary | ICD-10-CM | POA: Diagnosis not present

## 2012-01-21 DIAGNOSIS — J449 Chronic obstructive pulmonary disease, unspecified: Secondary | ICD-10-CM

## 2012-01-21 DIAGNOSIS — D684 Acquired coagulation factor deficiency: Secondary | ICD-10-CM

## 2012-01-21 DIAGNOSIS — J9819 Other pulmonary collapse: Secondary | ICD-10-CM | POA: Diagnosis not present

## 2012-01-21 DIAGNOSIS — G9340 Encephalopathy, unspecified: Secondary | ICD-10-CM

## 2012-01-21 DIAGNOSIS — J9600 Acute respiratory failure, unspecified whether with hypoxia or hypercapnia: Secondary | ICD-10-CM | POA: Diagnosis not present

## 2012-01-21 LAB — PROTIME-INR: INR: 1.44 (ref 0.00–1.49)

## 2012-01-21 LAB — BASIC METABOLIC PANEL
CO2: 28 mEq/L (ref 19–32)
Chloride: 103 mEq/L (ref 96–112)
Potassium: 3.6 mEq/L (ref 3.5–5.1)
Sodium: 138 mEq/L (ref 135–145)

## 2012-01-21 LAB — CBC
HCT: 27.7 % — ABNORMAL LOW (ref 39.0–52.0)
Hemoglobin: 9.1 g/dL — ABNORMAL LOW (ref 13.0–17.0)
WBC: 5.7 10*3/uL (ref 4.0–10.5)

## 2012-01-21 LAB — DIFFERENTIAL
Basophils Relative: 1 % (ref 0–1)
Lymphs Abs: 0.9 10*3/uL (ref 0.7–4.0)
Monocytes Relative: 10 % (ref 3–12)
Neutro Abs: 4.1 10*3/uL (ref 1.7–7.7)
Neutrophils Relative %: 72 % (ref 43–77)

## 2012-01-21 LAB — GLUCOSE, CAPILLARY
Glucose-Capillary: 156 mg/dL — ABNORMAL HIGH (ref 70–99)
Glucose-Capillary: 167 mg/dL — ABNORMAL HIGH (ref 70–99)

## 2012-01-21 LAB — HEPARIN LEVEL (UNFRACTIONATED): Heparin Unfractionated: 0.23 IU/mL — ABNORMAL LOW (ref 0.30–0.70)

## 2012-01-21 MED ORDER — ENSURE CLINICAL ST REVIGOR PO LIQD
237.0000 mL | Freq: Two times a day (BID) | ORAL | Status: DC
  Administered 2012-01-21 – 2012-01-24 (×7): 237 mL via ORAL
  Administered 2012-01-25 (×2): via ORAL
  Administered 2012-01-26 – 2012-01-27 (×3): 237 mL via ORAL

## 2012-01-21 MED ORDER — HEPARIN SOD (PORCINE) IN D5W 100 UNIT/ML IV SOLN
2050.0000 [IU]/h | INTRAVENOUS | Status: DC
  Administered 2012-01-21 – 2012-01-23 (×4): 2050 [IU]/h via INTRAVENOUS
  Filled 2012-01-21 (×6): qty 250

## 2012-01-21 MED ORDER — TRACE MINERALS CR-CU-MN-SE-ZN 10-1000-500-60 MCG/ML IV SOLN
INTRAVENOUS | Status: DC
  Filled 2012-01-21: qty 2000

## 2012-01-21 MED ORDER — POTASSIUM CHLORIDE CRYS ER 20 MEQ PO TBCR
40.0000 meq | EXTENDED_RELEASE_TABLET | Freq: Once | ORAL | Status: AC
  Administered 2012-01-21: 40 meq via ORAL
  Filled 2012-01-21: qty 1

## 2012-01-21 MED ORDER — WARFARIN SODIUM 5 MG IV SOLR
5.0000 mg | Freq: Once | INTRAVENOUS | Status: AC
  Administered 2012-01-21: 5 mg via INTRAVENOUS
  Filled 2012-01-21: qty 2.5

## 2012-01-21 MED ORDER — FAT EMULSION 20 % IV EMUL
250.0000 mL | INTRAVENOUS | Status: DC
  Filled 2012-01-21: qty 250

## 2012-01-21 NOTE — Progress Notes (Signed)
Nutrition Follow-up  Pt able to answer some questions with family members help. Pt family stated pt ate <25% lunch on 01/20/12, 25% dinner on 01/20/12, and >75% breakfast on 01/21/12. I looked at pt's breakfast tray while in room to confirm pt did eat >75%. Pt stated his appetite was beginning to increase but very slowly. Pt stated he is having some abdominal pain this morning; pt wife states she thinks it is gas.   Per rehab admission note, pt will go to ip rehab once he is eating better.   Diet Order:  Low fiber  Pt receiving TPN Clinimix E 5/15 at 65 ml/hr with Lipids (20% IVFE at 10 ml/hr), multivitamins, and trace elements are provided 3 times weekly MWF due to national backorder. Pt to receive lipids today. This provides 1283 kcal and 78 grams protein daily (based on weekly average). Meets 73% minimum re-estimated kcal and 87% minimum re-estimated protein needs.  Meds: Scheduled Meds:   . acyclovir ointment   Topical Q3H  . antiseptic oral rinse  1 application Mouth Rinse QID  . carvedilol  3.125 mg Oral BID WC  . chlorhexidine  15 mL Mouth/Throat BID  . digoxin  0.125 mg Oral Daily  . ertapenem  1 g Intravenous Q24H  . furosemide  20 mg Intravenous Daily  . insulin aspart  0-20 Units Subcutaneous Q4H  . insulin glargine  15 Units Subcutaneous Daily  . ipratropium  0.5 mg Nebulization Q6H  . latanoprost  1 drop Both Eyes QHS  . levalbuterol  0.63 mg Nebulization Q6H  . pantoprazole  40 mg Oral Q1200  . potassium chloride  40 mEq Oral Once  . warfarin  5 mg Intravenous ONCE-1800  . warfarin  5 mg Oral ONCE-1800  . DISCONTD: pantoprazole  40 mg Oral BID AC   Continuous Infusions:   . TPN (CLINIMIX) +/- additives 65 mL/hr at 01/19/12 2300   And  . fat emulsion 10 kcal (01/19/12 2300)  . TPN (CLINIMIX) +/- additives     And  . fat emulsion    . heparin 1,950 Units/hr (01/21/12 1610)  . lactated ringers 20 mL (01/19/12 1043)  . TPN (CLINIMIX) +/- additives 65 mL/hr at 01/20/12  1705   Past Surgical History  Procedure Date  . Bypass graft 1989    LIMA-LAD, SVG-OM  . Mitral valve replacement 2007    Mechanical, [facility] at [facility]  . Hernia repair   . External ear surgery     drum  . Colon resection 01/12/2012    Procedure: COLON RESECTION;  Surgeon: Adolph Pollack, MD;  Location: [facility] OR;  Service: General;  Laterality: N/A;   PRN Meds:.acetaminophen, acetaminophen, fentaNYL, HYDROmorphone, lip balm  Labs:  CMP     Component Value Date/Time   NA 138 01/21/2012 0450   K 3.6 01/21/2012 0450   CL 103 01/21/2012 0450   CO2 28 01/21/2012 0450   GLUCOSE 157* 01/21/2012 0450   BUN 18 01/21/2012 0450   CREATININE 0.79 01/21/2012 0450   CALCIUM 8.4 01/21/2012 0450   PROT 4.8* 01/19/2012 0408   ALBUMIN 1.7* 01/19/2012 0408   AST 19 01/19/2012 0408   ALT 10 01/19/2012 0408   ALKPHOS 32* 01/19/2012 0408   BILITOT 0.3 01/19/2012 0408   GFRNONAA 88* 01/21/2012 0450   GFRAA >90 01/21/2012 0450     Intake/Output Summary (Last 24 hours) at 01/21/12 0948 Last data filed at 01/21/12 0719  Gross per 24 hour  Intake   2932 ml  Output   3365 ml  Net   -433 ml    Weight Status:  74.1 kg on 01/21/12 (trending down from 78.8 kg on 01/13/12)  Re-estimated needs:  1750-1950 kcals and 90-105 grams protein  Nutrition Dx:  Inadequate oral intake now related to altered GI function, ileus as evidenced by insufficient intake compared to estimated requirements.   New Goal: PO intake and TPN to meet >90% of estimated kcal and protein needs, maximize energy provision as able during national lipid backorder, progressing.   Intervention:    If PO intake continues to be >75%, recommend discontinuing TPN.  Order Ensure Clinical Strength BID to help meet estimated protein needs.  TPN per pharmacy  RD to follow for nutrition care plan  Monitor:  PO intake, diet advancement, weight trends, labs, I/O's  Karenann Cai Pager #:  [phone] Derrell Lolling Anastasia Fiedler [phone]

## 2012-01-21 NOTE — Progress Notes (Signed)
Patient discussed at the Long Length of Stay [name] Weeks 01/21/2012  

## 2012-01-21 NOTE — Progress Notes (Signed)
9 Days Post-Op  Subjective: Tolerating diet without nausea, but appetite is poor and he really doesn't take much and. Own TNA he's been ambulating some. He has a productive cough. Abdomen still sore but getting better.  No fever. No tachycardia. WBC 5700. Hemoglobin 9.1. Glucose 156.  Chest x-ray today shows no infiltrates, chronic bronchitic changes. No significant effusion.  Objective: Vital signs in last 24 hours: Temp:  [97.2 F (36.2 C)-98.6 F (37 C)] 97.7 F (36.5 C) (01/30 0411) Pulse Rate:  [80-105] 82  (01/30 0411) Resp:  [23-38] 34  (01/30 0411) BP: (98-145)/(38-78) 98/74 mmHg (01/30 0411) SpO2:  [92 %-96 %] 95 % (01/30 0411) Weight:  [163 lb 5.8 oz (74.1 kg)] 163 lb 5.8 oz (74.1 kg) (01/30 0023) Last BM Date: 01/20/12  Intake/Output from previous day: 01/29 0701 - 01/30 0700 In: 2725 [P.O.:710; I.V.:550; IV Piggyback:100; TPN:1300] Out: 3400 [Urine:3350; Stool:50] Intake/Output this shift: Total I/O In: 828 [I.V.:308; TPN:520] Out: 2150 [Urine:2100; Stool:50]  General appearance: alert. The condition. Mild confusion. Wife in room with him. Resp: scattered rhonchi bilaterally. No wheeze. GI: soft, nondistended. Diffusely sore. Midline wound clean, open, bleeds a little bit (on Coumadin). Stoma healthy. Lots of stool in bag.  Lab Results:  Results for orders placed during the hospital encounter of 01/11/12 (from the past 24 hour(s))  GLUCOSE, CAPILLARY     Status: Abnormal   Collection Time   01/20/12  7:53 AM      Component Value Range   Glucose-Capillary 182 (*) 70 - 99 (mg/dL)   Comment 1 Notify RN    GLUCOSE, CAPILLARY     Status: Abnormal   Collection Time   01/20/12 12:03 PM      Component Value Range   Glucose-Capillary 190 (*) 70 - 99 (mg/dL)   Comment 1 STAT Lab    GLUCOSE, CAPILLARY     Status: Abnormal   Collection Time   01/20/12  3:47 PM      Component Value Range   Glucose-Capillary 179 (*) 70 - 99 (mg/dL)  GLUCOSE, CAPILLARY     Status:  Abnormal   Collection Time   01/20/12  8:01 PM      Component Value Range   Glucose-Capillary 184 (*) 70 - 99 (mg/dL)   Comment 1 Documented in Chart     Comment 2 Notify RN    GLUCOSE, CAPILLARY     Status: Abnormal   Collection Time   01/21/12  4:15 AM      Component Value Range   Glucose-Capillary 156 (*) 70 - 99 (mg/dL)   Comment 1 Documented in Chart     Comment 2 Notify RN    CBC     Status: Abnormal   Collection Time   01/21/12  4:50 AM      Component Value Range   WBC 5.7  4.0 - 10.5 (K/uL)   RBC 3.08 (*) 4.22 - 5.81 (MIL/uL)   Hemoglobin 9.1 (*) 13.0 - 17.0 (g/dL)   HCT 29.5 (*) 62.1 - 52.0 (%)   MCV 89.9  78.0 - 100.0 (fL)   MCH 29.5  26.0 - 34.0 (pg)   MCHC 32.9  30.0 - 36.0 (g/dL)   RDW 30.8 (*) 65.7 - 15.5 (%)   Platelets 228  150 - 400 (K/uL)  DIFFERENTIAL     Status: Normal   Collection Time   01/21/12  4:50 AM      Component Value Range   Neutrophils Relative 72  43 - 77 (%)  Neutro Abs 4.1  1.7 - 7.7 (K/uL)   Lymphocytes Relative 16  12 - 46 (%)   Lymphs Abs 0.9  0.7 - 4.0 (K/uL)   Monocytes Relative 10  3 - 12 (%)   Monocytes Absolute 0.6  0.1 - 1.0 (K/uL)   Eosinophils Relative 2  0 - 5 (%)   Eosinophils Absolute 0.1  0.0 - 0.7 (K/uL)   Basophils Relative 1  0 - 1 (%)   Basophils Absolute 0.0  0.0 - 0.1 (K/uL)     Studies/Results: @RISRSLT24 @     . acyclovir ointment   Topical Q3H  . antiseptic oral rinse  1 application Mouth Rinse QID  . carvedilol  3.125 mg Oral BID WC  . chlorhexidine  15 mL Mouth/Throat BID  . digoxin  0.125 mg Oral Daily  . ertapenem  1 g Intravenous Q24H  . furosemide  20 mg Intravenous Daily  . insulin aspart  0-20 Units Subcutaneous Q4H  . insulin glargine  15 Units Subcutaneous Daily  . ipratropium  0.5 mg Nebulization Q6H  . latanoprost  1 drop Both Eyes QHS  . levalbuterol  0.63 mg Nebulization Q6H  . pantoprazole  40 mg Oral Q1200  . warfarin  5 mg Oral ONCE-1800  . 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 Ileus slowly resolving. Continue low residue diet and see how he does. Continue TNA. Continue Invanz antibiotic.   Open abdominal wound, continue twice a day dressing changes.   History of COPD and acute postoperative respiratory failure, resolved. Being followed by [facility].   History coronary artery disease, status post CABG, history of MVR with mechanical valve replacement, ischemic cardiomyopathy, atrial fibrillation with controlled rate. starting back on Coumadin. Followed by [facility].  Diabetes mellitus. On Lantus insulin plus sliding-scale insulin.   Significant deconditioning. Continue PT. May need rehabilitation or SNF.   LOS: 10 days    Karely Hurtado M 01/21/2012  . .prob

## 2012-01-21 NOTE — Progress Notes (Signed)
Name: [name] MRN: [id number] DOB: [birth year]    LOS: 10  PCCM PROGRESS  NOTE  History of Present Illness:  Admitted to [facility] with COPD exacerbation on 1/14.  Developed abdominal pain.  CT scan on 1/18 demonstrated diverticulitis and free air.  Started on antibiotics.  Transferred to [facility] on 1/20.  Repeat  CT scan demonstrated pneumoperitoneum and developing abscess.  Brought to OR on 1/21 for exploratory laparotomy, drainage of intraabdominal abscess, sigmoid colectomy and colostomy.  Lines / Drains: 1/21  ETT>>1/23 1/21  NGT>>out 1/21  Foley>> 1/21  R IJ TLC>>> (TPN) 1/21  JP drain>>  Cultures: None  Antibiotics: 1/18  Cipro>>> 1/21 1/18  Flagyl (abd abscess)>>1/21 1/21  Ertapenem (abd abscess)>>>plan stop 8 days  Tests / Events: 1/20  Abdominal CT>>>pneumoperitoneum / intraabdominal abscess 1/21  Exploratory laparotomy, drainage of intraabdominal abscess, sigmoid colectomy and colostomy  SUBJ: Tolerating diet Appears well  Vital Signs: Temp:  [97.7 F (36.5 C)-98.6 F (37 C)] 97.7 F (36.5 C) (01/30 1227) Pulse Rate:  [80-104] 104  (01/30 1227) Resp:  [23-38] 33  (01/30 1227) BP: (98-136)/(38-74) 117/56 mmHg (01/30 1227) SpO2:  [92 %-96 %] 95 % (01/30 1227) Weight:  [74.1 kg (163 lb 5.8 oz)] 74.1 kg (163 lb 5.8 oz) (01/30 0023) I/O last 3 completed shifts: In: 3874 [P.O.:710; I.V.:1009; Other:65; IV Piggyback:100] Out: 4315 [Urine:4150; Drains:15; Stool:150]  Physical Examination: General:  Awake and alert, no distress Neuro: awake and alert, interactive, non focal Neck:  No JVD   Cardiovascular: Irregularly irregular rate, no murmurs, rate wnl Lungs:   scattered rhonchi Abdomen:  Viable colostomy, surgically dressing clean / dry / intact Musculoskeletal: No edema Skin:  No rash  Labs and Imaging:   Dg Chest Port 1 View  01/21/2012  *RADIOLOGY REPORT*  Clinical Data: Shortness of breath.  PORTABLE CHEST - 1 VIEW  Comparison: Chest 01/19/2012  01/20/2012.  Findings: Right IJ catheter remains in place.  There is some subsegmental atelectasis in the left lung base.  Right lung is clear.  No pneumothorax.  Heart size normal.  IMPRESSION: Mild subsegmental atelectasis left lung base.  Original Report Authenticated By: Bernadene Bell. Maricela Curet, M.D.   Dg Chest Port 1 View  01/20/2012  *RADIOLOGY REPORT*  Clinical Data: Shortness of breath  PORTABLE CHEST - 1 VIEW  Comparison: January 17, 2012.  Findings: Sternotomy wires are noted.  Nasogastric tube has been removed.  No change is noted in position of right internal jugular catheter.  No acute pulmonary disease is noted.  IMPRESSION: No acute cardiopulmonary abnormality seen.  Original Report Authenticated By: Venita Sheffield., M.D.    Lab 01/21/12 0450 01/20/12 0418 01/19/12 0408  NA 138 138 136  K 3.6 3.8 3.6  CL 103 103 100  CO2 28 29 31   BUN 18 21 20   CREATININE 0.79 0.81 0.78  GLUCOSE 157* 167* 169*    Lab 01/21/12 0450 01/20/12 0418 01/19/12 0408  HGB 9.1* 9.0* 9.6*  HCT 27.7* 27.2* 28.8*  WBC 5.7 6.7 7.2  PLT 228 189 174     Assessment and Plan:  Intraabdominal abscess, s/p eploratory laparotomy, drainage of intraabdominal abscess, sigmoid colectomy and colostomy.  No signs of sepsis.  Lab 01/21/12 0450 01/20/12 0418 01/19/12 0408 01/18/12 0453 01/17/12 0500  WBC 5.7 6.7 7.2 7.5 7.1   ---> clinically well, stop date added 31st, total 10 days  Post-operative respiratory failure RESOLVED.  History of COPD.   No results found for this  basename: PHART:5,PCO2:5,PCO2ART:5,PO2ART:5 in the last 168 hours Resolving  Lasix to neg balance tolerated well, maintain IS pcxr improved LLL  History of CAD s/p CABG.  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  Ischemic cardiomyopathy.  Atrial fibrillation.  No signs of active ischemia.  Atrial fibrillation with controlled rate.  Dyslipidemia.  Hep drip noted Rate wnl Dig level reviewed, if rate issus, load dig bolus Likely  need to restart low dose coreg Cards note reviewed  Hematemesis is on the record but no active hematemesis noted and no bloody discharge from NG.  This is on the background of Coumadin, ASA and Indocin.  Stable hemoglobin.  Lab 01/21/12 0450 01/20/12 0418 01/19/12 0408 01/18/12 0453 01/17/12 0500  HCT 27.7* 27.2* 28.8* 30.8* 31.1*   -->  Protonix to qday diet  Afib, anticoagualtion  Lab 01/21/12 0450 01/20/12 0418 01/19/12 0408  INR 1.44 1.53* 1.32   -->  Heparin per pharmacy as above coum per pharm, restart   Diabetes / hyperglycemia  Lab 01/21/12 1140 01/21/12 0745 01/21/12 0415 01/20/12 2001 01/20/12 1547  GLUCAP 141* 154* 156* 184* 179*   -->  SSI HG protocol Add lantus 10, tolerated well, controled  Protein Calorie Malnutrition Severe -->dc tpn, raises risks infection etc, poor outcome overall longer used fam updated daily, wife Follow calorie count Ensure added  [name]. Tyson Alias, MD, FACP Pgr: [phone] [facility]

## 2012-01-21 NOTE — Progress Notes (Signed)
Subjective:  Overall stable.  Tolerating walking.  No chest pain or dyspnea.  Appetite still poor.  Objective:  Vital Signs in the last 24 hours: Temp:  [97.2 F (36.2 C)-98.6 F (37 C)] 97.8 F (36.6 C) (01/30 0714) Pulse Rate:  [80-105] 87  (01/30 0714) Resp:  [23-38] 32  (01/30 0714) BP: (98-145)/(38-78) 123/68 mmHg (01/30 0714) SpO2:  [92 %-96 %] 94 % (01/30 0714) Weight:  [163 lb 5.8 oz (74.1 kg)] 163 lb 5.8 oz (74.1 kg) (01/30 0023)  Intake/Output from previous day: 01/29 0701 - 01/30 0700 In: 2932 [P.O.:710; I.V.:627; IV Piggyback:100; TPN:1430] Out: 3515 [Urine:3350; Drains:15; Stool:150]   Physical Exam: General: chronically ill appearing.  Head:  Normocephalic and atraumatic. Lungs: some ronchii Heart: irregularly irregular rhythm. Crisp VS.    Pulses: Pulses decrease slighlty Extremities: No clubbing or cyanosis. No edema. Neurologic: Alert and oriented x 3.    Lab Results:  Basename 01/21/12 0450 01/20/12 0418  WBC 5.7 6.7  HGB 9.1* 9.0*  PLT 228 189    Basename 01/21/12 0450 01/20/12 0418  NA 138 138  K 3.6 3.8  CL 103 103  CO2 28 29  GLUCOSE 157* 167*  BUN 18 21  CREATININE 0.79 0.81   No results found for this basename: TROPONINI:2,CK,MB:2 in the last 72 hours Hepatic Function Panel  Basename 01/19/12 0408  PROT 4.8*  ALBUMIN 1.7*  AST 19  ALT 10  ALKPHOS 32*  BILITOT 0.3  BILIDIR --  IBILI --   No results found for this basename: CHOL in the last 72 hours No results found for this basename: PROTIME in the last 72 hours  Imaging: Dg Chest Port 1 View  01/20/2012  *RADIOLOGY REPORT*  Clinical Data: Shortness of breath  PORTABLE CHEST - 1 VIEW  Comparison: January 17, 2012.  Findings: Sternotomy wires are noted.  Nasogastric tube has been removed.  No change is noted in position of right internal jugular catheter.  No acute pulmonary disease is noted.  IMPRESSION: No acute cardiopulmonary abnormality seen.  Original Report Authenticated  By: Venita Sheffield., M.D.     Assessment/Plan:  Patient Active Hospital Problem List: Diverticulitis of large intestine with perforation s/p sigmoid colectomy and colostomy 01/12/12 (01/12/2012)   Assessment: Now on TPN so I and O are pos   Plan: monitor I/0--about 1500cc yesterday Atrial fibrillation (05/01/2009)   Assessment: controlled VR   Plan: continue current meds SP MVR   Watching anticoagulation closely.  Anemia stable.  Continue to monitor.  INR 1.4 still subtherapeutic.      Cassell Clement, MD,  01/21/2012, 7:43 AM

## 2012-01-21 NOTE — Progress Notes (Signed)
ANTICOAGULATION CONSULT NOTE - Follow Up Consult  Pharmacy Consult for Heparin Indication: atrial fibrillation and MVR  Allergies  Allergen Reactions  . Codeine     REACTION: hives  . Sulfonamide Derivatives     REACTION: hives  . Tylenol-Codeine     REACTION: hives    Patient Measurements: Height: 5' 4.8" (164.6 cm) Weight: 163 lb 5.8 oz (74.1 kg) IBW/kg (Calculated) : 61.04   Vital Signs: Temp: 97.7 F (36.5 C) (01/30 0411) Temp src: Oral (01/30 0411) BP: 98/74 mmHg (01/30 0411) Pulse Rate: 82  (01/30 0411)  Labs:  Basename 01/21/12 0450 01/20/12 0418 01/19/12 0408  HGB 9.1* 9.0* --  HCT 27.7* 27.2* 28.8*  PLT 228 189 174  APTT -- -- --  LABPROT 17.8* 18.7* 16.6*  INR 1.44 1.53* 1.32  HEPARINUNFRC 0.23* 0.40 0.42  CREATININE 0.79 0.81 0.78  CKTOTAL -- -- --  CKMB -- -- --  TROPONINI -- -- --   Estimated Creatinine Clearance: 78.2 ml/min (by C-G formula based on Cr of 0.79).   Medication:  Infusions:     . TPN (CLINIMIX) +/- additives 65 mL/hr at 01/19/12 2300   And  . fat emulsion 10 kcal (01/19/12 2300)  . heparin 1,850 Units/hr (01/21/12 0358)  . lactated ringers 20 mL (01/19/12 1043)  . TPN (CLINIMIX) +/- additives 65 mL/hr at 01/20/12 1705    Assessment: 71 y/o male with h/o MVR/Afib for anticoagulation  IV site changed this morning but heparin infusing OK otherwise per RN.  INR decreased from yesterday after 5 mg daily x 3.  Home regimen was approx 3 mg / day.  Given TNA, concerned pt may not be absorbing properly.  Will give Coumadin IV today.  Goal of Therapy:  Heparin level 0.3-0.7 units/ml INR 2.5-3.5   Plan:  Increase Heparin 1950 units/hr. Check heparin level in 8 hours. Coumadin 5 mg IV today.  Geannie Risen, PharmD, BCPS

## 2012-01-21 NOTE — Progress Notes (Signed)
Heparin protocol:  Heparin level = 0.32 goal 0.3-0.7 Afib/MVR INR still subtherapeutic  Plan:  1. Increase heparin to 2050 units/hr 2. F/u with AM heparin level

## 2012-01-21 NOTE — Evaluation (Signed)
Occupational Therapy Evaluation Patient Details Name: [name] MRN: [id number] DOB: 27-May-1941 Today's Date: 01/21/2012  Problem List:  Patient Active Problem List  Diagnoses  . DIAB W/O COMP TYPE II/UNS NOT STATED UNCNTRL  . HYPERLIPIDEMIA-MIXED  . HYPERTENSION, UNSPECIFIED  . CAD, ARTERY BYPASS GRAFT  . Atrial fibrillation  . Chronic systolic heart failure  . MITRAL VALVE REPLACEMENT, HX OF  . Mitral valve disorder  . Long term current use of anticoagulant  . COPD (chronic obstructive pulmonary disease)  . 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 Severe sepsis without septic shock  . Wound, open, abdominal wall, anterior-healing by secondary intention  . Protein-calorie malnutrition, severe    Past Medical History:  Past Medical History  Diagnosis Date  . Atrial fibrillation   . Hypertension   . Hyperkalemia   . Acute systolic heart failure   . Coronary artery disease     Cath 2005- patent LIMA-LAD, SVG-OM grafts, moderate MR  . Prostate cancer    Past Surgical History:  Past Surgical History  Procedure Date  . Bypass graft 1989    LIMA-LAD, SVG-OM  . Mitral valve replacement 2007    Mechanical, performed at [facility]  . Hernia repair   . External ear surgery     drum  . Colon resection 01/12/2012    Procedure: COLON RESECTION;  Surgeon: Adolph Pollack, MD;  Location: [facility] OR;  Service: General;  Laterality: N/A;    OT Assessment/Plan/Recommendation OT Assessment Clinical Impression Statement: Pt transferred from [facility] to [facility] on 01/20 for second opinion. Evaluated by Dr. Donell Beers and CT abdomen with worsening of free air with large abscess and diverticulitis in LLQ.  Coumadin reversed and pt underwent exploratory lap with drainage on intra-abdominal abscess, sigmoid colectomy and colostomy by [name] on 01/21.  Post op on heparin drip.  Followed by CCM for SIRS and post op respiratory failure with  encephalopathy. Extubated 01/23. AFib with RVR treated with IV digoxin and BB. On TNA for nutritional support due to post op ileus. NGT discontinued 01/27. Pt presents with below problem list and will benefit fom skilled OT in the acute setting followed by CIR to increase I with ADL and ADL mobility to Mod I/I level prior to d/c home with family OT Recommendation/Assessment: Patient will need skilled OT in the acute care venue OT Problem List: Decreased strength;Decreased activity tolerance;Impaired balance (sitting and/or standing);Decreased coordination;Decreased knowledge of use of DME or AE;Decreased knowledge of precautions;Pain;Cardiopulmonary status limiting activity OT Therapy Diagnosis : Generalized weakness;Acute pain OT Plan OT Frequency: Min 2X/week OT Treatment/Interventions: Self-care/ADL training;Therapeutic exercise;Energy conservation;DME and/or AE instruction;Therapeutic activities;Patient/family education;Balance training OT Recommendation Recommendations for Other Services: Rehab consult Follow Up Recommendations: Inpatient Rehab Equipment Recommended: Defer to next venue Individuals Consulted Consulted and Agree with Results and Recommendations: Patient OT Goals Acute Rehab OT Goals OT Goal Formulation: With patient Time For Goal Achievement: 7 days ADL Goals Pt Will Perform Grooming: Independently;Standing at sink ADL Goal: Grooming - Progress: Goal set today Pt Will Perform Upper Body Bathing: with modified independence;Sitting in shower ADL Goal: Upper Body Bathing - Progress: Goal set today Pt Will Perform Lower Body Bathing: with supervision;Sit to stand in shower ADL Goal: Lower Body Bathing - Progress: Goal set today Pt Will Perform Upper Body Dressing: with set-up;Sitting, bed ADL Goal: Upper Body Dressing - Progress: Goal set today Pt Will Perform Lower Body Dressing: with min assist;Sit to stand from chair ADL Goal: Lower  Body Dressing - Progress: Goal set  today Pt Will Transfer to Toilet: with modified independence;Ambulation;with DME ADL Goal: Toilet Transfer - Progress: Goal set today Pt Will Perform Tub/Shower Transfer: Shower transfer;with supervision;Ambulation;with DME ADL Goal: Tub/Shower Transfer - Progress: Goal set today  OT Evaluation Precautions/Restrictions  Precautions Precautions: Fall Required Braces or Orthoses: No Restrictions Weight Bearing Restrictions: No Prior Functioning Home Living Lives With: Spouse Receives Help From: Family Type of Home: House Home Layout: One level Home Access: Stairs to enter Entrance Stairs-Rails: Right Entrance Stairs-Number of Steps: 4 Bathroom Shower/Tub: Psychologist, counselling;Door [employer] Toilet: Standard Home Adaptive Equipment: Engineer, drilling - rolling Prior Function Level of Independence: Independent with basic ADLs;Independent with homemaking with ambulation;Independent with gait;Independent with transfers Driving: Yes Vocation: Part time employment ADL ADL Eating/Feeding: Simulated;Independent Where Assessed - Eating/Feeding: Chair Grooming: Performed;Wash/dry face;Minimal assistance Grooming Details (indicate cue type and reason): set up; Mod VC for sequencing; Min A for standing balance at sink requiring A from therapist Where Assessed - Grooming: Standing at sink Upper Body Bathing: Simulated;Minimal assistance Where Assessed - Upper Body Bathing: Sitting, chair Lower Body Bathing: Simulated;Maximal assistance Where Assessed - Lower Body Bathing: Sit to stand from chair Upper Body Dressing: Simulated;Moderate assistance Where Assessed - Upper Body Dressing: Sitting, chair Lower Body Dressing: Simulated;Maximal assistance Where Assessed - Lower Body Dressing: Sit to stand from chair Toilet Transfer: Simulated;Minimal assistance Toilet Transfer Details (indicate cue type and reason): Min hand-held assist Toilet Transfer Method: Ambulating Toileting -  Clothing Manipulation: Simulated;Minimal assistance Where Assessed - Toileting Clothing Manipulation: Standing Toileting - Hygiene: Simulated;Minimal assistance Where Assessed - Toileting Hygiene: Standing Tub/Shower Transfer: Not assessed Ambulation Related to ADLs: Min to Mod hand-held assist  ADL Comments: Pt easily fatigued and c/o increasing abdominal pain  Vision/Perception  Vision - History Patient Visual Report: No change from baseline Vision - Assessment Vision Assessment: Vision not tested [employer] Orientation Level: Oriented X4 Sensation/Coordination Sensation Light Touch: Appears Intact Coordination Gross Motor Movements are Fluid and Coordinated: No Fine Motor Movements are Fluid and Coordinated: No Coordination and Movement Description: Patient movements not fluid. Question if this is secondary to uncertainty?? Will continue to assess Extremity Assessment RUE Assessment RUE Assessment: Within Functional Limits (easily fatigued) LUE Assessment LUE Assessment: Within Functional Limits (easily fatigued) Mobility  Bed Mobility Rolling Right: 3: Mod assist;With rail Rolling Right Details (indicate cue type and reason): VC's for technique. Right Sidelying to Sit: 3: Mod assist;With rails Transfers Sit to Stand: 4: Min assist;From bed End of Session OT - End of Session Equipment Utilized During Treatment: Gait belt Activity Tolerance: Patient limited by fatigue Patient left: in chair;with call bell in reach Nurse Communication: Mobility status for transfers;Mobility status for ambulation   Sagrario Lineberry 01/21/2012, 2:47 PM

## 2012-01-21 NOTE — Progress Notes (Signed)
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 Codeine     REACTION: hives  . Sulfonamide Derivatives     REACTION: hives  . Tylenol-Codeine     REACTION: hives    Patient Measurements: Height: 5' 4.8" (164.6 cm) Weight: 163 lb 5.8 oz (74.1 kg) IBW/kg (Calculated) : 61.04  Adjusted Body Weight: 65 Kg   Vital Signs: Temp: 97.8 F (36.6 C) (01/30 0714) Temp src: Oral (01/30 0714) BP: 129/53 mmHg (01/30 0852) Pulse Rate: 85  (01/30 0852) Intake/Output from previous day: 01/29 0701 - 01/30 0700 In: 2932 [P.O.:710; I.V.:627; IV Piggyback:100; TPN:1430] Out: 3515 [Urine:3350; Drains:15; Stool:150] Intake/Output from this shift: Total I/O In: -  Out: 300 [Urine:300]  Labs:  [facility] 01/21/12 0450 01/20/12 0418 01/19/12 0408  WBC 5.7 6.7 7.2  HGB 9.1* 9.0* 9.6*  HCT 27.7* 27.2* 28.8*  PLT 228 189 174  APTT -- -- --  INR 1.44 1.53* 1.32     Basename 01/21/12 0450 01/20/12 0418 01/19/12 0408  NA 138 138 136  K 3.6 3.8 3.6  CL 103 103 100  CO2 28 29 31   GLUCOSE 157* 167* 169*  BUN 18 21 20   CREATININE 0.79 0.81 0.78  LABCREA -- -- --  CREAT24HRUR -- -- --  CALCIUM 8.4 8.4 8.1*  MG -- 1.9 1.8  PHOS -- 3.6 4.2  PROT -- -- 4.8*  ALBUMIN -- -- 1.7*  AST -- -- 19  ALT -- -- 10  ALKPHOS -- -- 32*  BILITOT -- -- 0.3  BILIDIR -- -- --  IBILI -- -- --  PREALBUMIN -- -- --  TRIG -- -- --  CHOLHDL -- -- --  CHOL -- -- --   Estimated Creatinine Clearance: 78.2 ml/min (by C-G formula based on Cr of 0.79).    Basename 01/21/12 0745 01/21/12 0415 01/20/12 2001  GLUCAP 154* 156* 184*    Insulin Requirements in the past 12 hours:  32 units SSI, Lantus 15 units, TPN bag contains 65 units regular insulin  Current Nutrition:  Low fiber diet.  Nutritional Goals per RD:  1900-2100 kCal, 100-110 grams of protein per day; Currently  receiving average 1313kcal and 78g protein daily + diet.  Assessment: Diverticulitis of large intestine with perforation and abdominal abscess. s/p sig colectomy and colostomy 1/21.   GI Prolonged ileus. PO intake 710cc/24h. Low residue diet ordered. Stool and flatus noted by MD. Very poor appetite.  Endo h/o DM (on metformin at home) .Lantus started 15uts/hd. [id number] (32 units SSI/24h). CBGs now closer around 150 since insulin increased in TPN last night.    Lytes/Renal K low normal 3.6.   Hepatobil LFTs WNL, Chol 108 WNL, trig slightly high 176with hx of HLD. Prealb 6.9 low, expected post-op and inflammation  Pulm/Neuro H/o COPD:  on atrovent, levalbuterol  Cardiology h/o AFIB/mech. MVR/HF/CAD/OHS/HTN: Digoxin and heparin gtt/Coumadin. VSS. (home meds not resumed: statin/lasix)  ID Intra abd abscess  on Ertapenem. No cx pending. MRSA PCR neg. Afebrile with WBC down to 5.7  Px PO PPI BID and IV heparin/Coumadin (hx afib, MVR)     Plan:  1. Continue Clinimix E 5/15 at 13ml/hr. Will not advance due to low residue diet. 2. MWF supplementation of lipids (68ml/hr), MVI and trace elements d/t national shortages. 3. Will follow CBGs today and evaluate increasing insulin further tomorrow. 4. KDur 40 po x 1 today.

## 2012-01-21 NOTE — Progress Notes (Signed)
Rehab admissions - Evaluated for possible admission.  I spoke with patient, wife and daughter.  I gave them booklets about inpatient rehab to review.  Once patient is eating better, I anticipate being able to admit to inpatient rehab.  I will follow progress for now.  Pager [phone]

## 2012-01-21 NOTE — Progress Notes (Signed)
Order received to D/C TPN.  Will taper TPN by decreasing to 40 ml/hr x 2 hours, and then discontinuing at 1800 today.  Estella Husk, Pharm.D., BCPS Clinical Pharmacist  Pager [phone] 01/21/2012, 3:57 PM

## 2012-01-21 NOTE — PMR Pre-admission (Signed)
PMR Admission Coordinator Pre-Admission Assessment  Patient:  [name] is an 71 y.o., male MRN:  [id number] DOB:  1941-11-26 Height:  5' 4.8" (164.6 cm) Weight:  72 kg (158 lb 11.7 oz) (bed scale)  Insurance Information: HMO: No HMO     PPO:      PCP:      IPA:      80/20:      OTHER:  PRIMARY:Medicare A/B      Policy#:[id number]      Subscriber:Ketan Wallace Cullens CM Name:       Phone#:      Fax#:  Pre-Cert#:       Employer:Retired - worked parttime  Benefits:  Phone #:      Name:[employer] Eff. Date:03/01/05A and 12/01/09B Deduct:$[id number]  Out of Pocket Max:0  Life WUJ:[id number] CIR:100%      SNF:100 days   LBD = 09/05/11 Outpatient:80%     Co-Pay:20% Home Health:100%      Co-Pay:none DME:80%     Co-Pay:20% Providers:patient's choice  SECONDARY:[employer]      Policy#:[id number]      Subscriber:Josh Wallace Cullens CM Name:       Phone#:      Fax#:  Pre-Cert#:       Employer:Retired Benefits:  Phone #[phone]     Name:  Eff. Date:      Deduct:       Out of Pocket Max:       Life Max:  CIR:       SNF:  Outpatient:      Co-Pay:  Home Health:       Co-Pay:  DME:      Co-Pay: Providers: In network  Current Medical History:   Patient Admitting Diagnosis:Deconditioned after abdominal abscess and expiratory laparotomy/colectomy with associated respiratory failure   History of Present Illness: Presented to [facility] with abdominal pain and SOB.  Developed severe abd pain Friday and xray with free air.  CT demonstrated perforated sigmoid diverticulits.  Transferred to [facility] 01/20.  Evaluated by Dr Donell Beers and CT abdomen with worsening free air with large abscess and diverticulitis in LLQ.  Underwent exploratory lap with drainage of abscess, sigmoid colectomy and colostomy by Dr Abbey Chatters on 01/21.  Post op hep drip continues.  Followed by CCM for SIRS and post op respiratory failure with encephalopathy.  Etubated 01/23  AFib with RVR and treated with IV digoxin and BB.  On TNA for nutritional  support due to post op ileus.  NG tube discontinued 01/27 and started on soft diet 01/29.  Patient continues of IV antibiotics and BID dressing changes for open abdominal wound.   Is now off TNA, taking oral diet, and has had BM.  Patients Past Medical History:   Past Medical History  Diagnosis Date  . Atrial fibrillation   . Hypertension   . Hyperkalemia   . Acute systolic heart failure   . Coronary artery disease     Cath 2005- patent LIMA-LAD, SVG-OM grafts, moderate MR  . Prostate cancer    Family Medical History:  family history includes Cancer in his brother and sister; Coronary artery disease in his mother; Heart attack in his brother, mother, and sister; and Stroke in his father. Patients Current Diet: Fiber Restricted  Prior Rehab/Hospitalizations: Had cardiac rehab many years ago after heart surgery.  Current Medications: Current facility-administered medications:acetaminophen (TYLENOL) tablet 650 mg, 650 mg, Oral, Q6H PRN, Kalman Shan, MD, 650 mg at 01/23/12 0447;  acyclovir ointment (ZOVIRAX) 5 %, ,  Topical, Q3H, Shan Levans, MD, 1 application at 01/23/12 [phone];  antiseptic oral rinse (BIOTENE) solution 15 mL, 1 application, Mouth Rinse, QID, Konstantin Zubelevitskiy, MD, 15 mL at 01/23/12 0400 carvedilol (COREG) tablet 3.125 mg, 3.125 mg, Oral, BID WC, [name]. Tyson Alias, MD, 3.125 mg at 01/23/12 0981;  chlorhexidine (PERIDEX) 0.12 % solution 15 mL, 15 mL, Mouth/Throat, BID, Lonia Farber, MD, 15 mL at 01/22/12 2106;  digoxin (LANOXIN) tablet 0.125 mg, 0.125 mg, Oral, Daily, [name], MD, 0.125 mg at 01/22/12 1914 feeding supplement (ENSURE CLINICAL STRENGTH) liquid 237 mL, 237 mL, Oral, BID BM, Jeoffrey Massed, RD, 237 mL at 01/22/12 1534;  fentaNYL (SUBLIMAZE) injection 12.5 mcg, 12.5 mcg, Intravenous, Q2H PRN, [name]. Tyson Alias, MD, 12.5 mcg at 01/22/12 2101;  furosemide (LASIX) tablet 20 mg, 20 mg, Oral, Daily, Cassell Clement, MD, 20 mg at 01/22/12  1007 heparin ADULT infusion 100 units/ml (25000 units/250 ml), 2,050 Units/hr, Intravenous, Continuous, Lonia Farber, MD, Last Rate: 20.5 mL/hr at 01/23/12 0744, 2,050 Units/hr at 01/23/12 0744;  insulin aspart (novoLOG) injection 0-20 Units, 0-20 Units, Subcutaneous, Q4H, Rakesh V. Vassie Loll, MD, 3 Units at 01/22/12 2026;  insulin glargine (LANTUS) injection 15 Units, 15 Units, Subcutaneous, Daily, Christella Hartigan, PHARMD, 15 Units at 01/22/12 0954 ipratropium (ATROVENT) nebulizer solution 0.5 mg, 0.5 mg, Nebulization, Q6H, Shan Levans, MD, 0.5 mg at 01/23/12 0750;  lactated ringers infusion, , Intravenous, Continuous, Lonia Farber, MD, Last Rate: 20 mL/hr at 01/19/12 1043, 20 mL at 01/19/12 1043;  latanoprost (XALATAN) 0.005 % ophthalmic solution 1 drop, 1 drop, Both Eyes, QHS, Harvette Velora Heckler, MD, 1 drop at 01/22/12 2107 levalbuterol (XOPENEX) nebulizer solution 0.63 mg, 0.63 mg, Nebulization, Q6H, Shan Levans, MD, 0.63 mg at 01/23/12 0750;  lip balm (BLISTEX) ointment, , Topical, PRN, Shan Levans, MD;  pantoprazole (PROTONIX) EC tablet 40 mg, 40 mg, Oral, Q1200, [name]. Tyson Alias, MD, 40 mg at 01/22/12 1237;  warfarin (COUMADIN) tablet 5 mg, 5 mg, Oral, ONCE-1800, Gala Lewand[place]sky [place], PHARMD, 5 mg at 01/22/12 1807 DISCONTD: acetaminophen (TYLENOL) suppository 650 mg, 650 mg, Rectal, Q4H PRN, Lonia Farber, MD, 650 mg at 01/13/12 1204;  DISCONTD: acetaminophen (TYLENOL) tablet 650 mg, 650 mg, Oral, Q6H PRN, Aris Lot S. Harris, MD, 650 mg at 01/18/12 1540;  DISCONTD: ertapenem (INVANZ) 1 g in sodium chloride 0.9 % 50 mL IVPB, 1 g, Intravenous, Q24H, [name]. Tyson Alias, MD, 1 g at 01/21/12 7829 DISCONTD: HYDROmorphone (DILAUDID) tablet 1 mg, 1 mg, Oral, Q4H PRN, Sherrie George, PA  Additional Precautions/Restrictions: Precautions Precautions: Fall Required Braces or Orthoses: No Restrictions Weight Bearing Restrictions: No Wears glasses and is hard of  hearing.  Therapy Assessments Physical Therapy: Precautions Precautions: Fall Required Braces or Orthoses: No Home Living Lives With: Spouse Receives Help From: Family Type of Home: House Home Layout: One level Home Access: Stairs to enter Entrance Stairs-Rails: Right Entrance Stairs-Number of Steps: 4 Bathroom Shower/Tub: Psychologist, counselling;Door [employer] Toilet: Standard Home Adaptive Equipment: Engineer, drilling - rolling Prior Function Level of Independence: Independent with basic ADLs;Independent with homemaking with ambulation;Independent with gait;Independent with transfers Driving: Yes Vocation: Part time employment Comments: very active per daughter Coordination Gross Motor Movements are Fluid and Coordinated: No Fine Motor Movements are Fluid and Coordinated: No Coordination and Movement Description: Patient movements not fluid. Question if this is secondary to uncertainty?? Will continue to assess  Occupational Therapy: Precautions Precautions: Fall Required Braces or Orthoses: No Home Living Lives With: Spouse Receives Help From: Family Type of Home: House Home Layout:  One level Home Access: Stairs to enter Entrance Stairs-Rails: Right Entrance Stairs-Number of Steps: 4 Bathroom Shower/Tub: Walk-in shower;Door [employer] Toilet: Standard Home Adaptive Equipment: Engineer, drilling - rolling Prior Function Level of Independence: Independent with basic ADLs;Independent with homemaking with ambulation;Independent with gait;Independent with transfers Driving: Yes Vocation: Part time employment Comments: very active per daughter Coordination Gross Motor Movements are Fluid and Coordinated: No Fine Motor Movements are Fluid and Coordinated: No Coordination and Movement Description: Patient movements not fluid. Question if this is secondary to uncertainty?? Will continue to assess Restrictions Weight Bearing Restrictions: No ADL Eating/Feeding:  Simulated;Independent Where Assessed - Eating/Feeding: Chair Grooming: Performed;Wash/dry face;Minimal assistance Grooming Details (indicate cue type and reason): set up; Mod VC for sequencing; Min A for standing balance at sink requiring A from therapist Where Assessed - Grooming: Standing at sink Upper Body Bathing: Simulated;Minimal assistance Where Assessed - Upper Body Bathing: Sitting, chair Lower Body Bathing: Simulated;Maximal assistance Where Assessed - Lower Body Bathing: Sit to stand from chair Upper Body Dressing: Simulated;Moderate assistance Where Assessed - Upper Body Dressing: Sitting, chair Lower Body Dressing: Simulated;Maximal assistance Where Assessed - Lower Body Dressing: Sit to stand from chair Toilet Transfer: Simulated;Minimal assistance Toilet Transfer Details (indicate cue type and reason): Min hand-held assist Toilet Transfer Method: Ambulating Toileting - Clothing Manipulation: Simulated;Minimal assistance Where Assessed - Toileting Clothing Manipulation: Standing Toileting - Hygiene: Simulated;Minimal assistance Where Assessed - Toileting Hygiene: Standing Tub/Shower Transfer: Not assessed Ambulation Related to ADLs: Min to Mod hand-held assist  ADL Comments: Pt easily fatigued and c/o increasing abdominal pain   Prior Function: Level of Independence: Independent with basic ADLs;Independent with homemaking with ambulation;Independent with gait;Independent with transfers Driving: Yes Vocation: Part time employment Comments: very active per daughter ADL Eating/Feeding: Simulated;Independent Where Assessed - Eating/Feeding: Chair Grooming: Performed;Wash/dry face;Minimal assistance Grooming Details (indicate cue type and reason): set up; Mod VC for sequencing; Min A for standing balance at sink requiring A from therapist Where Assessed - Grooming: Standing at sink Upper Body Bathing: Simulated;Minimal assistance Where Assessed - Upper Body Bathing:  Sitting, chair Lower Body Bathing: Simulated;Maximal assistance Where Assessed - Lower Body Bathing: Sit to stand from chair Upper Body Dressing: Simulated;Moderate assistance Where Assessed - Upper Body Dressing: Sitting, chair Lower Body Dressing: Simulated;Maximal assistance Where Assessed - Lower Body Dressing: Sit to stand from chair Toilet Transfer: Simulated;Minimal assistance Toilet Transfer Details (indicate cue type and reason): Min hand-held assist Toilet Transfer Method: Ambulating Toileting - Clothing Manipulation: Simulated;Minimal assistance Where Assessed - Toileting Clothing Manipulation: Standing Toileting - Hygiene: Simulated;Minimal assistance Where Assessed - Toileting Hygiene: Standing Tub/Shower Transfer: Not assessed Ambulation Related to ADLs: Min to Mod hand-held assist  ADL Comments: Pt easily fatigued and c/o increasing abdominal pain   Additional Prior Functional Levels:  Bed Mobility: I Transfers: I Mobility - Walk/Wheelchair: I Upper Body Dressing: I Lower Body Dressing: I Grooming: I Eating/Drinking: I Toilet Transfer: I Bladder Continence: Did in/out caths daily to cut down on scar tissue Bowel Management: Had diarrhea and black stools for 6 months intermittently Stair Climbing: I Communication: WNL Memory: WNL Cooking/Meal Prep: A Housework: A Money Management: I Driving: yes  Prior Activity Level: Community (5-7x/wk): Very active (Had his own lawn mowing business, 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)  ADLs/Mobility: ADL Eating/Feeding: Simulated;Independent Where Assessed - Eating/Feeding: Chair Grooming: Performed;Wash/dry face;Minimal assistance Grooming Details (indicate cue type and reason): set up; Mod VC for sequencing; Min A for standing balance at sink requiring A from therapist Where Assessed - Grooming: Standing at sink  Upper Body Bathing: Simulated;Minimal assistance Where Assessed - Upper Body Bathing: Sitting, chair Lower Body Bathing:  Simulated;Maximal assistance Where Assessed - Lower Body Bathing: Sit to stand from chair Upper Body Dressing: Simulated;Moderate assistance Where Assessed - Upper Body Dressing: Sitting, chair Lower Body Dressing: Simulated;Maximal assistance Where Assessed - Lower Body Dressing: Sit to stand from chair Toilet Transfer: Simulated;Minimal assistance Toilet Transfer Details (indicate cue type and reason): Min hand-held assist Toilet Transfer Method: Ambulating Toileting - Clothing Manipulation: Simulated;Minimal assistance Where Assessed - Toileting Clothing Manipulation: Standing Toileting - Hygiene: Simulated;Minimal assistance Where Assessed - Toileting Hygiene: Standing Tub/Shower Transfer: Not assessed Ambulation Related to ADLs: Min to Mod hand-held assist  ADL Comments: Pt easily fatigued and c/o increasing abdominal pain   Bed Mobility Bed Mobility: No Rolling Right: 3: Mod assist;With rail Rolling Right Details (indicate cue type and reason): VC's for technique. Rolling Left: 1: +2 Total assist (50%) Rolling Left Details (indicate cue type and reason): +2totalpt50%; pt continues to need sequencing cues and assist to initiate and for follow through; slower processing  Right Sidelying to Sit: 3: Mod assist;With rails Left Sidelying to Sit: 1: +2 Total assist (40%) Left Sidelying to Sit Details (indicate cue type and reason): +2totalpt25% for initiation and follow through; pt c/o pain throughout movement Sitting - Scoot to Edge of Bed: 2: Max assist Sitting - Scoot to [employer] of Bed Details (indicate cue type and reason): use of pad to scoot hips reciprocally Sit to Sidelying Left: 1: +2 Total assist Sit to Sidelying Left Details (indicate cue type and reason): facilitation to guide shoulders down to bed as rehab technician assisted with bring legs bed level; pt holding cardiac pillow tightly Scooting to HOB: 1: +2 Total assist (0%) Transfers Transfers: Yes Sit to Stand: Other  (comment);From chair/3-in-1;With armrests (min guard A) Sit to Stand Details (indicate cue type and reason): vc for hand placement, no forward w/shift needed Stand to Sit: To chair/3-in-1;With armrests;4: Min assist (for controlled descent) Stand to Sit Details: cues for slow descent Ambulation/Gait Ambulation/Gait: Yes Ambulation/Gait Assistance: 4: Min assist Ambulation/Gait Assistance Details (indicate cue type and reason): halted, guarded gait with tc's needed to keep pt moving forward at even a slow cacence. Ambulation Distance (Feet): 105 Feet Assistive device: Rolling walker Gait Pattern: Step-through pattern;Decreased stride length;Shuffle (generally guarded and mildly unsteady) Gait velocity: very slow, deliberate cadence; needed physical motivation to propel forward. Stairs: No Posture/Postural Control Posture/Postural Control: Postural limitations Postural Limitations:  (mildly forward posture that pt could correct with vc/tc's) Balance Balance Assessed: No (not formally, pt was mildly unsteady using RW with gait) Static Standing Balance Static Standing - Balance Support: Bilateral upper extremity supported Static Standing - Level of Assistance: 4: Min assist Static Standing - Comment/# of Minutes: in static standing pt with weight in heels and pulling on RW; poor trunk activation for tall posture and minimal balance reactions noted as pt begins to fall posteriorly in standing  Home Assistive Devices/Equipment:  Home Assistive Devices/Equipment Home Assistive Devices/Equipment: None  Discharge Planning:  Living Arrangements: Spouse/significant other Support Systems: Spouse/significant other;Children;Family members Do you have any problems obtaining your medications?: No Type of Residence: Private residence Home Care Services: No Patient expects to be discharged to:: home Case Management Consult Needed: No  Current Functional Levels:  Bladder Continence: Foley  catheter Bowel Management: Colostomy bag  Previous Home Environment:  Living Arrangements: Spouse/significant other Support Systems: Spouse/significant other;Children;Family members Do you have any problems obtaining your medications?: No Type of Residence: Private  residence Home Care Services: No Patient expects to be discharged to:: home  Discharge Living Setting:  Plans for Discharge Living Setting: Patient's home;House;Lives with (comment) (Lives with wife) Discharge Living Setting Number of Levels: 1 Discharge Living Setting Number of Steps: 3-4 Discharge Living Setting is Bedroom on Main Floor?: Yes Discharge Living Setting is Bathroom on Main Floor?: Yes  Social/Family/Support Systems:  Patient Roles: Spouse;Parent Contact Information: Vicent Febles (h) [phone] Anticipated Caregiver: wife, son, dtr Anticipated Caregiver's Contact Information: Johnney Ou - Dtr (c) [phone] and Delan Ksiazek - son (c) [phone] Ability/Limitations of Caregiver: wife can assist, dtr works as ED nurse at [employer] Caregiver Availability: 24/7 Discharge Plan Discussed with Primary Caregiver: Yes Is Caregiver In Agreement with Plan?: Yes Does Caregiver/Family have Issues with Lodging/Transportation while Pt is in Rehab?: No  Goals/Additional Needs:  Patient/Family Goal for Rehab: PT/OT S/mod I goals (ELOS = 2 weeks) Cultural Considerations: Baptist/Christian Dietary Needs: Low fiber diet, currently on TNA Equipment Needs: TBD Pt/Family Agrees to Admission and willing to participate: Yes Program Orientation Provided & Reviewed with Pt/Caregiver Including Roles  & Responsibilities: Yes  Preadmission Screen Completed By:  Trish Mage, 01/23/2012 8:56 AM  Patient's condition:  Please see physician update to information in consult dated 01/20/12.  Preadmission Screen Competed by: Roderic Palau, RN, Time/Date,858/01/22/13.  Discussed status with Dr. Riley Kill on 01/23/12 at 0900 (time/date) and  received telephone approval for admission today.  Admission Coordinator:  Trish Mage, time 0900/Date02/01/13

## 2012-01-22 DIAGNOSIS — G9340 Encephalopathy, unspecified: Secondary | ICD-10-CM | POA: Diagnosis not present

## 2012-01-22 DIAGNOSIS — J9600 Acute respiratory failure, unspecified whether with hypoxia or hypercapnia: Secondary | ICD-10-CM | POA: Diagnosis not present

## 2012-01-22 DIAGNOSIS — I4891 Unspecified atrial fibrillation: Secondary | ICD-10-CM | POA: Diagnosis not present

## 2012-01-22 DIAGNOSIS — K651 Peritoneal abscess: Secondary | ICD-10-CM | POA: Diagnosis not present

## 2012-01-22 DIAGNOSIS — J449 Chronic obstructive pulmonary disease, unspecified: Secondary | ICD-10-CM

## 2012-01-22 LAB — GLUCOSE, CAPILLARY
Glucose-Capillary: 112 mg/dL — ABNORMAL HIGH (ref 70–99)
Glucose-Capillary: 114 mg/dL — ABNORMAL HIGH (ref 70–99)
Glucose-Capillary: 133 mg/dL — ABNORMAL HIGH (ref 70–99)
Glucose-Capillary: 150 mg/dL — ABNORMAL HIGH (ref 70–99)

## 2012-01-22 LAB — HEPARIN LEVEL (UNFRACTIONATED): Heparin Unfractionated: 0.56 IU/mL (ref 0.30–0.70)

## 2012-01-22 LAB — CBC
HCT: 28.7 % — ABNORMAL LOW (ref 39.0–52.0)
Platelets: 224 10*3/uL (ref 150–400)
RDW: 15.6 % — ABNORMAL HIGH (ref 11.5–15.5)
WBC: 6.1 10*3/uL (ref 4.0–10.5)

## 2012-01-22 LAB — BASIC METABOLIC PANEL
CO2: 25 mEq/L (ref 19–32)
Calcium: 8.5 mg/dL (ref 8.4–10.5)
GFR calc Af Amer: >90 mL/min (ref >90)
GFR calc non Af Amer: 85 mL/min — ABNORMAL LOW (ref >90)
Sodium: 139 mEq/L (ref 135–145)

## 2012-01-22 LAB — PROTIME-INR: INR: 1.95 — ABNORMAL HIGH (ref 0.00–1.49)

## 2012-01-22 LAB — PHOSPHORUS: Phosphorus: 4 mg/dL (ref 2.3–4.6)

## 2012-01-22 LAB — MAGNESIUM: Magnesium: 2.1 mg/dL (ref 1.5–2.5)

## 2012-01-22 MED ORDER — ACETAMINOPHEN 325 MG PO TABS
650.0000 mg | ORAL_TABLET | Freq: Four times a day (QID) | ORAL | Status: DC | PRN
  Administered 2012-01-22 – 2012-01-26 (×9): 650 mg via ORAL
  Filled 2012-01-22 (×10): qty 2

## 2012-01-22 MED ORDER — FUROSEMIDE 20 MG PO TABS
20.0000 mg | ORAL_TABLET | Freq: Every day | ORAL | Status: DC
  Administered 2012-01-22 – 2012-01-27 (×6): 20 mg via ORAL
  Filled 2012-01-22 (×7): qty 1

## 2012-01-22 MED ORDER — WARFARIN SODIUM 5 MG PO TABS
5.0000 mg | ORAL_TABLET | Freq: Once | ORAL | Status: AC
  Administered 2012-01-22: 5 mg via ORAL
  Filled 2012-01-22: qty 1

## 2012-01-22 NOTE — Progress Notes (Signed)
Pt's regular urologist is Dr Assunta Gambles in [place] @ [phone]. Dr Marchelle Gearing requested this information.Dr paged and given other DR's phone #.  Marisa Cyphers RN

## 2012-01-22 NOTE — Consult Note (Signed)
WOC ostomy consult  Stoma type/location:  Colostomy Stomal assessment/size: Red and viable, visualized through pouch which was changed by bedside nurse yesterday. Output  Mod brown semi-formed stool. Ostomy pouching: 1pc.  Education provided: Pt has not participated in ostomy pouching or emptying.  Wife has assisted with pouch change several times and was previously familiar with this procedure.  Demonstrated emptying with velcro closure, son at bedside.  Pt plans to transfer to rehab, will continue to follow if at [facility].  Placed on [facility] discharge program.   Cammie Mcgee, RN, MSN, [employer]  [phone]

## 2012-01-22 NOTE — Progress Notes (Signed)
Subjective:  Overall stable.  Tolerating walking.  No chest pain or dyspnea.  Appetite is slowly improving.  Objective:  Vital Signs in the last 24 hours: Temp:  [97.4 F (36.3 C)-98.9 F (37.2 C)] 98 F (36.7 C) (01/31 0600) Pulse Rate:  [85-104] 95  (01/31 0600) Resp:  [16-33] 18  (01/31 0600) BP: (108-129)/(48-58) 116/50 mmHg (01/31 0600) SpO2:  [91 %-97 %] 91 % (01/31 0735) Weight:  [162 lb 7.7 oz (73.7 kg)] 162 lb 7.7 oz (73.7 kg) (01/31 0300)  Intake/Output from previous day: 01/30 0701 - 01/31 0700 In: 2102.8 [P.O.:530; I.V.:1002.8; IV Piggyback:50; TPN:520] Out: 1835 [Urine:1630; Drains:30; Stool:175]   Physical Exam: General: chronically ill appearing.  Head:  Normocephalic and atraumatic. Lungs: some ronchii Heart: irregularly irregular rhythm. Crisp VS.    Pulses: Pulses decrease slightly Extremities: No clubbing or cyanosis. No edema. Neurologic: Alert and oriented x 3.    Lab Results:  Basename 01/22/12 0500 01/21/12 0450  WBC 6.1 5.7  HGB 9.4* 9.1*  PLT 224 228    Basename 01/22/12 0500 01/21/12 0450  NA 139 138  K 3.9 3.6  CL 106 103  CO2 25 28  GLUCOSE 115* 157*  BUN 17 18  CREATININE 0.84 0.79   No results found for this basename: TROPONINI:2,CK,MB:2 in the last 72 hours Hepatic Function Panel No results found for this basename: PROT,ALBUMIN,AST,ALT,ALKPHOS,BILITOT,BILIDIR,IBILI in the last 72 hours No results found for this basename: CHOL in the last 72 hours No results found for this basename: PROTIME in the last 72 hours  Imaging: Dg Chest Port 1 View  01/21/2012  *RADIOLOGY REPORT*  Clinical Data: Shortness of breath.  PORTABLE CHEST - 1 VIEW  Comparison: Chest 01/19/2012 01/20/2012.  Findings: Right IJ catheter remains in place.  There is some subsegmental atelectasis in the left lung base.  Right lung is clear.  No pneumothorax.  Heart size normal.  IMPRESSION: Mild subsegmental atelectasis left lung base.  Original Report Authenticated  By: Bernadene Bell. Maricela Curet, M.D.     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   Plan: Continue regular diet, attempts at improved nutrition. Atrial fibrillation (05/01/2009)   Assessment: controlled VR   Plan: continue current meds.  Will switch back to oral lasix home dose 20 mg daily. SP MVR   Watching anticoagulation closely.  Anemia stable.  Continue to monitor.  INR 1.95 still subtherapeutic.         Cassell Clement, MD,  01/22/2012, 8:05 AM

## 2012-01-22 NOTE — Progress Notes (Signed)
ANTICOAGULATION CONSULT NOTE - Follow Up Consult  Pharmacy Consult for Heparin and Coumadin Indication: atrial fibrillation and MVR  Assessment: 71 y/o male with h/o MVR/Afib for anticoagulation. Heparin therapeutic on current rate. INR trending up at 1.95 after IV coumadin yesterday.    Home regimen was approx 3 mg / day but this may not be reflective of his current needs s/p colectomy and with possible decreased absorption.  Off TNA with stool output and good po intake this am.    Goal of Therapy:  Heparin level 0.3-0.7 units/ml INR 2.5-3.5   Plan:  Continue Heparin 2050 units/hr. Coumadin 5mg  po x1 tonight-- may need more oral coumadin than previously required due to changes in absorption.   Allergies  Allergen Reactions  . Codeine     REACTION: hives  . Sulfonamide Derivatives     REACTION: hives  . Tylenol-Codeine     REACTION: hives    Patient Measurements: Height: 5' 4.8" (164.6 cm) Weight: 162 lb 7.7 oz (73.7 kg) IBW/kg (Calculated) : 61.04   Vital Signs: Temp: 98 F (36.7 C) (01/31 0600) Temp src: Oral (01/31 0600) BP: 116/50 mmHg (01/31 0600) Pulse Rate: 95  (01/31 0600)  Labs:  Basename 01/22/12 0500 01/21/12 1455 01/21/12 0450 01/20/12 0418  HGB 9.4* -- 9.1* --  HCT 28.7* -- 27.7* 27.2*  PLT 224 -- 228 189  APTT -- -- -- --  LABPROT 22.6* -- 17.8* 18.7*  INR 1.95* -- 1.44 1.53*  HEPARINUNFRC 0.56 0.32 0.23* --  CREATININE 0.84 -- 0.79 0.81  CKTOTAL -- -- -- --  CKMB -- -- -- --  TROPONINI -- -- -- --   Estimated Creatinine Clearance: 74.3 ml/min (by C-G formula based on Cr of 0.84).   Medication:  Infusions:     . heparin 2,050 Units/hr (01/22/12 0353)  . lactated ringers 20 mL (01/19/12 1043)  . DISCONTD: fat emulsion    . DISCONTD: heparin 1,950 Units/hr (01/21/12 6962)  . DISCONTD: TPN (CLINIMIX) +/- additives 40 mL/hr at 01/21/12 1555  . DISCONTD: TPN [facility]) +/- additives      Link Snuffer, PharmD, BCPS 01/22/2012, 8:47  AM

## 2012-01-22 NOTE — Progress Notes (Signed)
Patient seen and examined. Spoke with son-in-law. Slowly improving.  Agree with assessment and care plan as outlined by Ms. Osborne.   Angelia Mould. Derrell Lolling, M.D., [facility] General and Minimally invasive Surgery Breast and Colorectal Surgery Office:   [phone] Pager:   [phone]

## 2012-01-22 NOTE — Progress Notes (Signed)
Physical Therapy Treatment Patient Details Name: [name] MRN: [id number] DOB: [birth year] Today's Date: 01/22/2012  PT Assessment/Plan  PT - Assessment/Plan Comments on Treatment Session: Gait was guarded; pt displayed some self-limiting behaviors incl. needing physical motivation to propel forward. PT Plan: Discharge plan remains appropriate Follow Up Recommendations: Inpatient Rehab Equipment Recommended: Defer to next venue PT Goals  Acute Rehab PT Goals Time For Goal Achievement: 2 weeks PT Goal: Rolling Supine to Right Side - Progress: Other (comment) (not addressed today) PT Goal: Supine/Side to Sit - Progress: Other (comment) (not addressed today) PT Goal: Sit to Stand - Progress: Progressing toward goal PT Goal: Stand to Sit - Progress: Progressing toward goal PT Transfer Goal: Bed to Chair/Chair to Bed - Progress: Progressing toward goal PT Goal: Ambulate - Progress: Progressing toward goal  PT Treatment Precautions/Restrictions  Precautions Precautions: Fall Required Braces or Orthoses: No Restrictions Weight Bearing Restrictions: No Mobility (including Balance) Bed Mobility Bed Mobility: No Transfers Transfers: Yes Sit to Stand: Other (comment);From chair/3-in-1;With armrests (min guard A) Sit to Stand Details (indicate cue type and reason): vc for hand placement, no forward w/shift needed Stand to Sit: To chair/3-in-1;With armrests;4: Min assist (for controlled descent) Ambulation/Gait Ambulation/Gait: Yes Ambulation/Gait Assistance: 4: Min assist Ambulation/Gait Assistance Details (indicate cue type and reason): halted, guarded gait with tc's needed to keep pt moving forward at even a slow cacence. Ambulation Distance (Feet): 105 Feet Assistive device: Rolling walker Gait Pattern: Step-through pattern;Decreased stride length;Shuffle (generally guarded and mildly unsteady) Gait velocity: very slow, deliberate cadence; needed physical motivation to propel  forward. Stairs: No  Posture/Postural Control Postural Limitations:  (mildly forward posture that pt could correct with vc/tc's) Balance Balance Assessed: No (not formally, pt was mildly unsteady using RW with gait) Exercise    End of Session PT - End of Session Activity Tolerance: Patient tolerated treatment well;Other (comment) (self limiting behaviors, possibly due to lack of confidence) Patient left: in chair;with call bell in reach;with family/visitor present Nurse Communication: Mobility status for transfers;Mobility status for ambulation General Behavior During Session: [facility] for tasks performed Cognition: [facility] for tasks performed  Milton Streicher, Eliseo Gum 01/22/2012, 12:58 PM  01/22/2012  [place], PT [phone] (pager)

## 2012-01-22 NOTE — Progress Notes (Signed)
Pt ambulated to nurses station with walker and nurse with no complaints.

## 2012-01-22 NOTE — Progress Notes (Signed)
Patient ID: [name], male   DOB: 1941-02-10, 71 y.o.   MRN: [id number] 10 Days Post-Op  Subjective: Pt eating a little better.  Ate about 3/4 of breakfast.  No other complaints.  Cough about the same.  Objective: Vital signs in last 24 hours: Temp:  [97.4 F (36.3 C)-98.9 F (37.2 C)] 98 F (36.7 C) (01/31 0600) Pulse Rate:  [91-104] 96  (01/31 0952) Resp:  [16-33] 18  (01/31 0600) BP: (108-117)/(48-58) 116/50 mmHg (01/31 0600) SpO2:  [91 %-97 %] 91 % (01/31 0735) Weight:  [162 lb 7.7 oz (73.7 kg)] 162 lb 7.7 oz (73.7 kg) (01/31 0300) Last BM Date: 01/21/12  Intake/Output from previous day: 01/30 0701 - 01/31 0700 In: 2102.8 [P.O.:530; I.V.:1002.8; IV Piggyback:50; TPN:520] Out: 1835 [Urine:1630; Drains:30; Stool:175] Intake/Output this shift: Total I/O In: 240 [P.O.:240] Out: 600 [Urine:600]  PE: Abd: soft, wound is clean and packed.  Ostomy with air and stool. Appropriately tender.  +BS  Lab Results:   Basename 01/22/12 0500 01/21/12 0450  WBC 6.1 5.7  HGB 9.4* 9.1*  HCT 28.7* 27.7*  PLT 224 228   BMET  Basename 01/22/12 0500 01/21/12 0450  NA 139 138  K 3.9 3.6  CL 106 103  CO2 25 28  GLUCOSE 115* 157*  BUN 17 18  CREATININE 0.84 0.79  CALCIUM 8.5 8.4   PT/INR  Basename 01/22/12 0500 01/21/12 0450  LABPROT 22.6* 17.8*  INR 1.95* 1.44     Studies/Results: Dg Chest Port 1 View  01/21/2012  *RADIOLOGY REPORT*  Clinical Data: Shortness of breath.  PORTABLE CHEST - 1 VIEW  Comparison: Chest 01/19/2012 01/20/2012.  Findings: Right IJ catheter remains in place.  There is some subsegmental atelectasis in the left lung base.  Right lung is clear.  No pneumothorax.  Heart size normal.  IMPRESSION: Mild subsegmental atelectasis left lung base.  Original Report Authenticated By: Bernadene Bell. Maricela Curet, M.D.    Anti-infectives: Anti-infectives     Start     Dose/Rate Route Frequency Ordered Stop   01/12/12 2200   ertapenem (INVANZ) 1 g in sodium chloride 0.9 %  50 mL IVPB  Status:  Discontinued        1 g 100 mL/hr over 30 Minutes Intravenous Every 24 hours 01/12/12 2106 01/12/12 2115   01/12/12 1800   ciprofloxacin (CIPRO) IVPB 400 mg  Status:  Discontinued        400 mg 200 mL/hr over 60 Minutes Intravenous Every 12 hours 01/12/12 0102 01/12/12 1018   01/12/12 1400   metroNIDAZOLE (FLAGYL) IVPB 500 mg  Status:  Discontinued        500 mg 100 mL/hr over 60 Minutes Intravenous Every 8 hours 01/12/12 0102 01/12/12 1018   01/12/12 1030   ertapenem (INVANZ) 1 g in sodium chloride 0.9 % 50 mL IVPB  Status:  Discontinued        1 g 100 mL/hr over 30 Minutes Intravenous Every 24 hours 01/12/12 1018 01/22/12 0928   01/12/12 0115   ciprofloxacin (CIPRO) IVPB 400 mg        400 mg 200 mL/hr over 60 Minutes Intravenous  Once 01/12/12 0105 01/12/12 0613   01/12/12 0115   metroNIDAZOLE (FLAGYL) IVPB 500 mg        500 mg 100 mL/hr over 60 Minutes Intravenous  Once 01/12/12 0105 01/12/12 0444           Assessment/Plan  1. S/p Hartmann's for diverticulitis 2. Post-op ileus, resolving 3. Deconditioning  Plan: 1. Cont current wound care and ostomy care 2. Not surprised by decrease in appetite, cont to encourage PO intake   LOS: 11 days    Charlann Wayne E 01/22/2012

## 2012-01-22 NOTE — Progress Notes (Addendum)
Name: [name] MRN: [id number] DOB: 1941/06/30    LOS: 11  PCCM PROGRESS  NOTE  History of Present Illness: Known COPD , also on nitrafurantoin since dec 2012 with itnermittent self cath at home. Then, admitted to [facility] with COPD exacerbation on 1/14.  Developed abdominal pain.  CT scan on 1/18 demonstrated diverticulitis and free air.  Started on antibiotics.  Transferred to [facility] on 1/20.  Repeat  CT scan demonstrated pneumoperitoneum and developing abscess.  Brought to OR on 1/21 for exploratory laparotomy, drainage of intraabdominal abscess, sigmoid colectomy and colostomy.  Lines / Drains: 1/21  ETT>>1/23 1/21  NGT>>out 1/21  Foley>> 1/21  R IJ TLC>>> (TPN) 1/21  JP drain>>  Cultures: None  Antibiotics: 1/18  Cipro>>> 1/21 1/18  Flagyl (abd abscess)>>1/21 1/21  Ertapenem (abd abscess)>>> (1/31)  Tests / Events: 1/12 echo  - ejection fraction was in the range of 40% to 45%. Hypokinesis of the inferior myocardium. Hypokinesis of the apical myocardium. Hypokinesis of the lateral myocardium Mitral valve: A prosthesis was present and functioning normally 1/20  Abdominal CT>>>pneumoperitoneum / intraabdominal abscess 1/21  Exploratory laparotomy, drainage of intraabdominal abscess, sigmoid colectomy and colostomy  SUBJ: Tolerating diet Appears well Still on heparin gtt Needed some fentanyl this am for pain Had NS VTACH This am - mag and phos normal  Vital Signs: Temp:  [97.4 F (36.3 C)-98.9 F (37.2 C)] 98 F (36.7 C) (01/31 0600) Pulse Rate:  [91-104] 95  (01/31 0600) Resp:  [16-33] 18  (01/31 0600) BP: (108-117)/(48-58) 116/50 mmHg (01/31 0600) SpO2:  [91 %-97 %] 91 % (01/31 0735) Weight:  [162 lb 7.7 oz (73.7 kg)] 162 lb 7.7 oz (73.7 kg) (01/31 0300) I/O last 3 completed shifts: In: 3305.5 [P.O.:530; I.V.:1425.5; IV Piggyback:50] Out: 4100 [Urine:3730; Drains:45; Stool:325]  Physical Examination: General:  Awake and alert, no distress. Very  deconditioned Neuro: awake and alert, interactive, non focal Neck:  No JVD   Cardiovascular: Irregularly irregular rate, no murmurs, rate wnl Lungs:   scattered rhonchi Abdomen:  Viable colostomy, surgically dressing clean / dry / intact Musculoskeletal: No edema Skin:  No rash  Labs and Imaging:   Dg Chest Port 1 View  01/21/2012  *RADIOLOGY REPORT*  Clinical Data: Shortness of breath.  PORTABLE CHEST - 1 VIEW  Comparison: Chest 01/19/2012 01/20/2012.  Findings: Right IJ catheter remains in place.  There is some subsegmental atelectasis in the left lung base.  Right lung is clear.  No pneumothorax.  Heart size normal.  IMPRESSION: Mild subsegmental atelectasis left lung base.  Original Report Authenticated By: Bernadene Bell. [name], M.D.    Lab 01/22/12 0500 01/21/12 0450 01/20/12 0418  NA 139 138 138  K 3.9 3.6 3.8  CL 106 103 103  CO2 25 28 29   BUN 17 18 21   CREATININE 0.84 0.79 0.81  GLUCOSE 115* 157* 167*    Lab 01/22/12 0500 01/21/12 0450 01/20/12 0418  HGB 9.4* 9.1* 9.0*  HCT 28.7* 27.7* 27.2*  WBC 6.1 5.7 6.7  PLT 224 228 189     Assessment and Plan:  Intraabdominal abscess, s/p eploratory laparotomy, drainage of intraabdominal abscess, sigmoid colectomy and colostomy.  No signs of sepsis.  Lab 01/22/12 0500 01/21/12 0450 01/20/12 0418 01/19/12 0408 01/18/12 0453  WBC 6.1 5.7 6.7 7.2 7.5   ---> clinically well, invanz stop date added 31st, total 10 days  Post-operative respiratory failure RESOLVED.  History of COPD.   No results found for this basename: PHART:5,PCO2:5,PCO2ART:5,PO2ART:5 in  the last 168 hours Resolving  Lasix to neg balance tolerated well, maintain IS pcxr improved LLL  History of CAD s/p CABG.  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  Ischemic cardiomyopathy.  Atrial fibrillation.  No signs of active ischemia.  Atrial fibrillation with controlled rate.  Dyslipidemia. Non sustained [facility] 1/31  Hep drip noted Rate wnl Dig level  reviewed, if rate issus, load dig bolus Likely need to restart low dose coreg Cards following  Hematemesis is on the record but no active hematemesis noted and no bloody discharge from NG.  This is on the background of Coumadin, ASA and Indocin.  Stable hemoglobin.  Lab 01/22/12 0500 01/21/12 0450 01/20/12 0418 01/19/12 0408 01/18/12 0453  HCT 28.7* 27.7* 27.2* 28.8* 30.8*   -->  Protonix to qday diet  Afib, anticoagualtion  Lab 01/22/12 0500 01/21/12 0450 01/20/12 0418 01/19/12 0408  INR 1.95* 1.44 1.53* 1.32   -->  Heparin per pharmacy as above coum per pharm, restart   Diabetes / hyperglycemia  Lab 01/22/12 0753 01/22/12 0359 01/21/12 2354 01/21/12 2037 01/21/12 1646  GLUCAP 111* 112* 115* 167* 167*   -->  SSI HG protocol Add lantus 10, tolerated well, controled  Protein Calorie Malnutrition Severe -->dced  tpn, raises risks infection etc, poor outcome overall longer used fam updated  Follow calorie count  Obstructive Uropathy Gives hx of itnermittent self cath prior to admission and on nitrafurantoin since dec 2012. Will contact his urologist about dc foley. I would want to avoid his nitrafurantoin given risk for lung toxicity  5:40 PM - discussed with his urologist [name] at [place]. 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 REquires intermittent self cath otheriwse patient will obstruct. HE feels when patient is clinically ready foley can come out but during hospital stay a RN will have to cath patient dailyy. If on intermittent self cath, he strongly feels patient needs low dose nitrafurantoin. He says he is aware but risk of pulmonary toxicity is low. I will d/w patient about this tomrrow 01/23/12   DISPO To rehab soon  [facility] Minor ACNP [name] PCCM Pager [phone] till 3 pm If no answer page [phone] 01/22/2012, 9:23 AM   STAFF NOTE: I, Billy [name] have personally reviewed patient's available data, including medical history,  events of note, physical examination and test results as part of my evaluation. I have discussed with resident/NP and other care providers such as pharmacist, RN and RRT.  In addition,  I personally evaluated patient and elicited key findings of improving resp failure and sepsis - plap. He is very deconditioned and will benefit from inpatient rehab who are following his course currently. I will contact his urologist abut foley dc plan.  Rest per NP/medical resident whose note is outlined above and that I agree with  Billy. Kalman [name], M.D., [facility].C.P Pulmonary and Critical Care Medicine Staff Physician [facility] Pager: [phone], If no answer or between  15:00h - 7:00h: call [phone]  01/22/2012 10:43 AM

## 2012-01-23 ENCOUNTER — Inpatient Hospital Stay (HOSPITAL_COMMUNITY): Payer: Medicare Other

## 2012-01-23 DIAGNOSIS — N134 Hydroureter: Secondary | ICD-10-CM

## 2012-01-23 DIAGNOSIS — J449 Chronic obstructive pulmonary disease, unspecified: Secondary | ICD-10-CM | POA: Diagnosis not present

## 2012-01-23 DIAGNOSIS — K651 Peritoneal abscess: Secondary | ICD-10-CM | POA: Diagnosis not present

## 2012-01-23 DIAGNOSIS — R0602 Shortness of breath: Secondary | ICD-10-CM | POA: Diagnosis not present

## 2012-01-23 DIAGNOSIS — I4891 Unspecified atrial fibrillation: Secondary | ICD-10-CM | POA: Diagnosis not present

## 2012-01-23 DIAGNOSIS — I5022 Chronic systolic (congestive) heart failure: Secondary | ICD-10-CM | POA: Diagnosis not present

## 2012-01-23 LAB — PHOSPHORUS: Phosphorus: 4.3 mg/dL (ref 2.3–4.6)

## 2012-01-23 LAB — CBC
Hemoglobin: 9.2 g/dL — ABNORMAL LOW (ref 13.0–17.0)
Platelets: 253 10*3/uL (ref 150–400)
RBC: 3.14 MIL/uL — ABNORMAL LOW (ref 4.22–5.81)
WBC: 5.2 10*3/uL (ref 4.0–10.5)

## 2012-01-23 LAB — BASIC METABOLIC PANEL
Chloride: 107 mEq/L (ref 96–112)
GFR calc Af Amer: >90 mL/min (ref >90)
GFR calc non Af Amer: 83 mL/min — ABNORMAL LOW (ref >90)
Potassium: 4.1 mEq/L (ref 3.5–5.1)
Sodium: 142 mEq/L (ref 135–145)

## 2012-01-23 LAB — HEPARIN LEVEL (UNFRACTIONATED): Heparin Unfractionated: 0.56 IU/mL (ref 0.30–0.70)

## 2012-01-23 LAB — PROTIME-INR
INR: 2.54 — ABNORMAL HIGH (ref 0.00–1.49)
Prothrombin Time: 27.8 seconds — ABNORMAL HIGH (ref 11.6–15.2)

## 2012-01-23 LAB — GLUCOSE, CAPILLARY
Glucose-Capillary: 135 mg/dL — ABNORMAL HIGH (ref 70–99)
Glucose-Capillary: 194 mg/dL — ABNORMAL HIGH (ref 70–99)

## 2012-01-23 LAB — MAGNESIUM: Magnesium: 2.1 mg/dL (ref 1.5–2.5)

## 2012-01-23 MED ORDER — WARFARIN SODIUM 3 MG PO TABS
3.0000 mg | ORAL_TABLET | Freq: Once | ORAL | Status: AC
  Administered 2012-01-23: 3 mg via ORAL
  Filled 2012-01-23: qty 1

## 2012-01-23 MED ORDER — BUDESONIDE 0.25 MG/2ML IN SUSP
0.2500 mg | Freq: Two times a day (BID) | RESPIRATORY_TRACT | Status: DC
  Administered 2012-01-23 – 2012-01-26 (×7): 0.25 mg via RESPIRATORY_TRACT
  Filled 2012-01-23 (×9): qty 2

## 2012-01-23 MED ORDER — NITROFURANTOIN MACROCRYSTAL 100 MG PO CAPS
100.0000 mg | ORAL_CAPSULE | Freq: Every day | ORAL | Status: DC
  Administered 2012-01-23 – 2012-01-27 (×5): 100 mg via ORAL
  Filled 2012-01-23 (×5): qty 1

## 2012-01-23 NOTE — Progress Notes (Signed)
Name: [name] MRN: [id number] DOB: Mar 01, 1941    LOS: 12  PCCM PROGRESS  NOTE  History of Present Illness: Known COPD , also on nitrafurantoin since dec 2012 with itnermittent self cath at home. Then, admitted to [facility] with COPD exacerbation on 1/14.  Developed abdominal pain.  CT scan on 1/18 demonstrated diverticulitis and free air.  Started on antibiotics.  Transferred to [facility] on 1/20.  Repeat  CT scan demonstrated pneumoperitoneum and developing abscess.  Brought to OR on 1/21 for exploratory laparotomy, drainage of intraabdominal abscess, sigmoid colectomy and colostomy.  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 Hypokinesis of the inferior myocardium. Hypokinesis of the apical myocardium. Hypokinesis of the lateral myocardium Mitral valve: A prosthesis was present and functioning normally 1/20  Abdominal CT>>>pneumoperitoneum / intraabdominal abscess 1/21  Exploratory laparotomy, drainage of intraabdominal abscess, sigmoid colectomy and colostomy 1/31 - floor 01/23/12 - ready to go to rehab, foley out, central line out  SUBJ: Tolerating diet. Not on TPN Appears well but deconditioned Still on heparin gtt but INR therapeutic and cards says it can come out.  Needed some fentanyl yesterday  Am  for pain and ? Yesterday pm for pain as well - abdomen   Vital Signs: Temp:  [97.9 F (36.6 C)-98.4 F (36.9 C)] 97.9 F (36.6 C) (02/01 0357) Pulse Rate:  [71-96] 81  (02/01 0751) Resp:  [18-21] 21  (02/01  0751) BP: (107-137)/(43-71) 137/71 mmHg (02/01 0357) SpO2:  [94 %-98 %] 96 % (02/01 0751) FiO2 (%):  [2 %] 2 % (02/01 0751) Weight:  [72 kg (158 lb 11.7 oz)-74.1 kg (163 lb 5.8 oz)] 72 kg (158 lb 11.7 oz) (02/01 0300) I/O last 3 completed shifts: In: 1774.1 [P.O.:950; I.V.:824.1] Out: 3005 [Urine:2720; Drains:60; Stool:225]  Physical Examination: General:  Awake and alert, no distress. Very deconditioned Neuro: awake and alert, interactive, non focal Neck:  No JVD   Cardiovascular: Irregularly irregular rate, no murmurs, rate wnl Lungs:   scattered rhonchi Abdomen:  Viable colostomy, surgically dressing clean / dry / intact Musculoskeletal: No edema Skin:  No rash  Labs and Imaging:   Dg Chest 2 View  01/23/2012  *RADIOLOGY REPORT*  Clinical Data: COPD and shortness of breath.  CHEST - 2 VIEW  Comparison: 01/21/2012  Findings: The cardiopericardial silhouette is enlarged. Interstitial markings are diffusely coarsened with chronic features.  No focal airspace consolidation or overt pulmonary edema.  Right IJ central line tip projects at the mid SVC level.  IMPRESSION: No focal airspace consolidation or overt airspace pulmonary edema.  Original Report Authenticated By: ERIC A. MANSELL, M.D.    Lab 01/23/12 0535 01/22/12 0500 01/21/12 0450  NA 142 139 138  K 4.1 3.9 3.6  CL 107 106 103  CO2 27 25 28   BUN 15 17 18   CREATININE 0.89 0.84 0.79  GLUCOSE 122* 115* 157*    Lab 01/23/12 0535 01/22/12 0500 01/21/12 0450  HGB 9.2* 9.4* 9.1*  HCT 28.5* 28.7* 27.7*  WBC 5.2 6.1 5.7  PLT 253 224 228     Assessment and Plan:  Intraabdominal abscess, s/p eploratory laparotomy, drainage of intraabdominal abscess, sigmoid colectomy and colostomy.  No signs of sepsis on 01/23/12. S/p completeion abx Rx and clinically well except for mild abdominal pain prn  Lab 01/23/12 0535 01/22/12 0500 01/21/12 0450 01/20/12 0418 01/19/12 0408  WBC 5.2 6.1 5.7 6.7 7.2  No results found for this basename:  PROCALCITON:5 in the last 168 hours PLAN  - monitor clinically  - advance po diet per CCS  - will need CCS followup in rehab and in outpatient - fentanyl prn for abdominal pain   Post-operative respiratory failure RESOLVED.  History of COPD.   No results found for this basename: PHART:5,PCO2:5,PCO2ART:5,PO2ART:5 in the last 168 hours PLAN cotninue xopenex and atrovent q6h scheduled Start and continue pulmicort neb bid -Needs opd pulmonary with [facility] in [place], [place]   History of CAD s/p CABG.  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   Ischemic cardiomyopathy.  - EF 01/03/12:  40% - 45%. Hypokinesis of the inferior myocardium. Hypokinesis of the apical myocardium. Hypokinesis of the lateral myocardium. Atrial fibrillation -anticoagulation Dyslipidemia   No results found for this basename: TROPONINI:5 in the last 168 hours   Lab 01/23/12 0535 01/22/12 0500 01/21/12 0450 01/20/12 0418 01/19/12 0408  INR 2.54* 1.95* 1.44 1.53* 1.32    > on 01/23/12:   No signs of active ischemia but did have short non-sustained V Tach 1/31 and 2/1 with normal mag/phos/K.Otherwise rate controlled. INR therapeutic 2's. On heparin gtt on 2/1 PLAN DC Heparin. Continue coumadin per pharmacy Continue digoxin, check level 01/24/12 Hold off trasnsfer to rehab on 01/23/12 and keep through to 01/26/12 so cards can get better handle on [facility] Cards following and per Dr Riley Kill on 01/23/12 will follow patient in ipatient rehab In rehab, will need periodic dig level checked Needs opd cards followup   Hematemesis is on the record but no active hematemesis noted and no bloody discharge from NG since coming to ICU.  This is on the background of Coumadin, ASA and Indocin.  Stable hemoglobin.  Lab 01/23/12 0535 01/22/12 0500 01/21/12 0450 01/20/12 0418 01/19/12 0408  HCT 28.5* 28.7* 27.7* 27.2* 28.8*  PLAN -->  Protonix to qday -> PRBC for hgb < 7gm% or < 8gm% if in active coronary  ischemia    Diabetes / hyperglycemia  Lab 01/23/12 0356 01/22/12 2355 01/22/12 2003 01/22/12 1637 01/22/12 1148  GLUCAP 100* 114* 149* 150* 133*   -->  SSI HG protocol - oral agents when fully eating po   Protein Calorie Malnutrition Severe -->off tpn since 1/30 atleast. Deconditioned  PLAN  - continue po diet  - tx to inpatient rehab    Obstructive Uropathy On 01/22/12 Dr Marchelle Gearing  discussed with his urologist Dr Achilles Dunk at [facility]. 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 REquires intermittent self cath otheriwse patient will obstruct. If on intermittent self cath, he strongly feels patient needs low dose nitrafurantoin. This is despite risk for opulmonary toxicity which is low but risk of recurrent UTI which is high without nitrafurantoin. On 2/1/1/3 I have updated patient of plan and he is agreeable to intermittent RN cath foley daily with nitrafurantoin.  DISPO Keep inpatient  Cancel rehab transfer 01/23/12 due to non sutained v tach Reassess for rehab transfer 02/05/12  Family updated - wife, son, patient by Dr MR   Dr. Kalman Shan, M.D., [facility].C.P Pulmonary and Critical  Care Medicine Staff Physician [facility] Deputy Pulmonary and Critical Care Pager: [phone], If no answer or between  15:00h - 7:00h: call [phone]  01/23/2012 9:26 AM

## 2012-01-23 NOTE — Progress Notes (Signed)
Occupational Therapy Treatment Patient Details Name: [name] MRN: [id number] DOB: [birth year] Today's Date: 01/23/2012 1:39-2:09  OT Assessment/Plan OT Assessment/Plan Comments on Treatment Session: This 70 yo male making progress, will continue to benefit from acute OT and CIR OT. OT Plan: Discharge plan remains appropriate OT Frequency: Min 2X/week Follow Up Recommendations: Inpatient Rehab Equipment Recommended: Defer to next venue OT Goals ADL Goals ADL Goal: Lower Body Dressing - Progress: Met ADL Goal: Toilet Transfer - Progress: Progressing toward goals  OT Treatment Precautions/Restrictions  Precautions Precautions: Fall Restrictions Weight Bearing Restrictions: No   ADL ADL Eating/Feeding: Performed;Independent Where Assessed - Eating/Feeding: Chair Grooming: Not assessed Upper Body Bathing: Not assessed Lower Body Bathing: Not assessed Upper Body Dressing: Not assessed Lower Body Dressing: Performed;Set up;Other (comment) (doff/donn socks) Where Assessed - Lower Body Dressing: Unsupported;Sitting, chair Toilet Transfer: Simulated;Other (comment) (min guard A) Toilet Transfer Details (indicate cue type and reason): bed out into hallway 180' back to chair with RW Toilet Transfer Method: Ambulating Toileting - Clothing Manipulation: Not assessed Where Assessed - Toileting Clothing Manipulation: Not assessed Toileting - Hygiene: Not assessed Tub/Shower Transfer: Not assessed Tub/Shower Transfer Method: Not assessed Ambulation Related to ADLs: Extended family arrived at end of session, encouarged pt and wife/other family to let NT set pt up for bath and let the pt do as much of the bath as he can do himself and then the NT can A with the rest, espcially being there when pt is standing up, NT ("D") made aware ADL Comments: only c/o pain was air in colostomy bag, released air for him and he reported that it felt better Mobility  Bed Mobility Bed Mobility:  Yes Rolling Left: 6: Modified independent (Device/Increase time);With rail;Other (comment) (HOB 20 degrees) Left Sidelying to Sit: 4: Min assist;With rails;HOB elevated (comment degrees) (20 degrees) Sitting - Scoot to Edge of Bed: 7: Independent Transfers Transfers: Yes Sit to Stand: With upper extremity assist;From bed;Other (comment) (min gurad A) Stand to Sit: With upper extremity assist;With armrests;To chair/3-in-1;Other (comment) (Min guard A) Exercises    End of Session OT - End of Session Equipment Utilized During Treatment: Other (comment) (RW) Activity Tolerance: Patient tolerated treatment well Patient left: in chair;with family/visitor present;Other (comment) (wife, daughter and 2 grandsons) General Behavior During Session: [facility] for tasks performed Cognition: [facility] for tasks performed  [name] [phone] 01/23/2012, 2:25 PM

## 2012-01-23 NOTE — Progress Notes (Signed)
ANTICOAGULATION CONSULT NOTE - Follow Up Consult  Pharmacy Consult for Heparin and Coumadin Indication: atrial fibrillation and MVR  Assessment: 71 y/o male with h/o MVR/Afib for anticoagulation. Heparin therapeutic on current rate. INR therapeutic today at 2.54. Appears to be absorbing drug s/p colectomy or may still be seeing affects of IV dose on 1/30.    Home regimen prior to colectomy was approx 3 mg / day .  Off TNA with stool output and good po intake this am.  H/H low but stable. Platelets stable. No issues noted.   Goal of Therapy:  Heparin level 0.3-0.7 units/ml INR 2.5-3.5   Plan:  Continue Heparin 2050 units/hr. Consider discontinuing as INR therapeutic.  Coumadin 3mg  po x1 tonight.  Follow-up INR and AM heparin level if continues.   Allergies  Allergen Reactions  . Codeine     REACTION: hives  . Sulfonamide Derivatives     REACTION: hives  . Tylenol-Codeine     REACTION: hives    Patient Measurements: Height: 5' 4.8" (164.6 cm) Weight: 158 lb 11.7 oz (72 kg) (bed scale) IBW/kg (Calculated) : 61.04   Vital Signs: Temp: 97.9 F (36.6 C) (02/01 0357) Temp src: Oral (02/01 0357) BP: 137/71 mmHg (02/01 0357) Pulse Rate: 81  (02/01 0751)  Labs:  Basename 01/23/12 0535 01/22/12 0500 01/21/12 1455 01/21/12 0450  HGB 9.2* 9.4* -- --  HCT 28.5* 28.7* -- 27.7*  PLT 253 224 -- 228  APTT -- -- -- --  LABPROT 27.8* 22.6* -- 17.8*  INR 2.54* 1.95* -- 1.44  HEPARINUNFRC 0.56 0.56 0.32 --  CREATININE 0.89 0.84 -- 0.79  CKTOTAL -- -- -- --  CKMB -- -- -- --  TROPONINI -- -- -- --   Estimated Creatinine Clearance: 64.7 ml/min (by C-G formula based on Cr of 0.89).   Medication:  Infusions:     . heparin 2,050 Units/hr (01/23/12 0744)  . lactated ringers 20 mL (01/19/12 1043)    Link Snuffer, PharmD, BCPS 01/23/2012, 8:51 AM

## 2012-01-23 NOTE — Progress Notes (Addendum)
Rehab admissions - Feel patient ready for inpatient rehab.  If MD agrees, can admit to inpatient rehab today.  Pager [phone]  Admission for today cancelled due to cardiac issues.  Will observe over weekend and check back on Monday for progress.  Pager [phone]

## 2012-01-23 NOTE — Plan of Care (Signed)
Problem: Phase II Progression Outcomes Goal: Progress activity as tolerated unless otherwise ordered Outcome: Completed/Met Date Met:  01/23/12 Pt continues to work with therapy on acute care.  Ignacia Palma, [place] [phone] 01/23/2012

## 2012-01-23 NOTE — Progress Notes (Signed)
Subjective:   Appears stable from a cardiac perspective.  Still has cough.  INR therapeutic.  I/0 remain negative  Objective:  Vital Signs in the last 24 hours: Temp:  [97.9 F (36.6 C)-98.4 F (36.9 C)] 97.9 F (36.6 C) (02/01 0357) Pulse Rate:  [71-96] 81  (02/01 0751) Resp:  [18-21] 21  (02/01 0751) BP: (107-137)/(43-71) 137/71 mmHg (02/01 0357) SpO2:  [94 %-98 %] 96 % (02/01 0751) FiO2 (%):  [2 %] 2 % (02/01 0751) Weight:  [72 kg (158 lb 11.7 oz)-74.1 kg (163 lb 5.8 oz)] 72 kg (158 lb 11.7 oz) (02/01 0300)  Intake/Output from previous day: 01/31 0701 - 02/01 0700 In: 770 [P.O.:770] Out: 2140 [Urine:2040; Drains:50; Stool:50]   Physical Exam: General: Chronically ill appearing.  Unshaven Irreg rhythm.  Crisp VS. Coarse ronchii without rales. No edema.    Lab Results:  Basename 01/23/12 0535 01/22/12 0500  WBC 5.2 6.1  HGB 9.2* 9.4*  PLT 253 224    Basename 01/23/12 0535 01/22/12 0500  NA 142 139  K 4.1 3.9  CL 107 106  CO2 27 25  GLUCOSE 122* 115*  BUN 15 17  CREATININE 0.89 0.84   No results found for this basename: TROPONINI:2,CK,MB:2 in the last 72 hours Hepatic Function Panel No results found for this basename: PROT,ALBUMIN,AST,ALT,ALKPHOS,BILITOT,BILIDIR,IBILI in the last 72 hours No results found for this basename: CHOL in the last 72 hours No results found for this basename: PROTIME in the last 72 hours  Imaging: Dg Chest 2 View  01/23/2012  *RADIOLOGY REPORT*  Clinical Data: COPD and shortness of breath.  CHEST - 2 VIEW  Comparison: 01/21/2012  Findings: The cardiopericardial silhouette is enlarged. Interstitial markings are diffusely coarsened with chronic features.  No focal airspace consolidation or overt pulmonary edema.  Right IJ central line tip projects at the mid SVC level.  IMPRESSION: No focal airspace consolidation or overt airspace pulmonary edema.  Original Report Authenticated By: ERIC A. MANSELL, M.D.      Assessment/Plan:    Patient Active Hospital Problem List:  Atrial fibrillation (05/01/2009)   Assessment:  INR therapuetic.  Stop heparin.  Pharmacy should manage   Plan: see above Chronic systolic heart failure (05/01/2009)   Assessment: intake output neg   Plan: continue low dose furosemide      Shawnie Pons, MD, [facility], [facility] 01/23/2012, 9:02 AM

## 2012-01-23 NOTE — Progress Notes (Signed)
11 Days Post-Op  Subjective: Some SVT, transfer to Rehab, held by Dr. Marchelle Gearing. 50ml thru drain.  Eating but doesn't like the food much.  Seems allot stonger and  In better spirits than when I saw him 2 days ago.    Objective: Vital signs in last 24 hours: Temp:  [97.9 F (36.6 C)-98.4 F (36.9 C)] 97.9 F (36.6 C) (02/01 0357) Pulse Rate:  [71-92] 81  (02/01 1042) Resp:  [18-21] 21  (02/01 0751) BP: (107-137)/(58-71) 137/71 mmHg (02/01 0357) SpO2:  [94 %-98 %] 96 % (02/01 0751) FiO2 (%):  [2 %] 2 % (02/01 0751) Weight:  [72 kg (158 lb 11.7 oz)] 72 kg (158 lb 11.7 oz) (02/01 0300) Last BM Date: 01/22/12  Intake/Output from previous day: 01/31 0701 - 02/01 0700 In: 770 [P.O.:770] Out: 2140 [Urine:2040; Drains:50; Stool:50] Intake/Output this shift: Total I/O In: 360 [P.O.:360] Out: 250 [Urine:250]  PE:  Alert, very much with it today .  Chest is clear anteriorly.  AbdL  Open wound wet to dry packed is OK serous fluid on the 4x4's, but pretty clean.  Ostomy working well.  Not distended.  Surgical tenderness +, tolerating diet low fiber. .  Lab Results:   Basename 01/23/12 0535 01/22/12 0500  WBC 5.2 6.1  HGB 9.2* 9.4*  HCT 28.5* 28.7*  PLT 253 224    Lab 01/19/12 0408 01/17/12 0500  AST 19 10  ALT 10 6  ALKPHOS 32* 31*  BILITOT 0.3 0.3  PROT 4.8* 4.8*  ALBUMIN 1.7* 1.8*    BMET  Basename 01/23/12 0535 01/22/12 0500  NA 142 139  K 4.1 3.9  CL 107 106  CO2 27 25  GLUCOSE 122* 115*  BUN 15 17  CREATININE 0.89 0.84  CALCIUM 8.5 8.5   PT/INR  Basename 01/23/12 0535 01/22/12 0500  LABPROT 27.8* 22.6*  INR 2.54* 1.95*     Studies/Results: Dg Chest 2 View  01/23/2012  *RADIOLOGY REPORT*  Clinical Data: COPD and shortness of breath.  CHEST - 2 VIEW  Comparison: 01/21/2012  Findings: The cardiopericardial silhouette is enlarged. Interstitial markings are diffusely coarsened with chronic features.  No focal airspace consolidation or overt pulmonary edema.   Right IJ central line tip projects at the mid SVC level.  IMPRESSION: No focal airspace consolidation or overt airspace pulmonary edema.  Original Report Authenticated By: ERIC A. MANSELL, M.D.    Anti-infectives: Anti-infectives     Start     Dose/Rate Route Frequency Ordered Stop   01/12/12 2200   ertapenem (INVANZ) 1 g in sodium chloride 0.9 % 50 mL IVPB  Status:  Discontinued        1 g 100 mL/hr over 30 Minutes Intravenous Every 24 hours 01/12/12 2106 01/12/12 2115   01/12/12 1800   ciprofloxacin (CIPRO) IVPB 400 mg  Status:  Discontinued        400 mg 200 mL/hr over 60 Minutes Intravenous Every 12 hours 01/12/12 0102 01/12/12 1018   01/12/12 1400   metroNIDAZOLE (FLAGYL) IVPB 500 mg  Status:  Discontinued        500 mg 100 mL/hr over 60 Minutes Intravenous Every 8 hours 01/12/12 0102 01/12/12 1018   01/12/12 1030   ertapenem (INVANZ) 1 g in sodium chloride 0.9 % 50 mL IVPB  Status:  Discontinued        1 g 100 mL/hr over 30 Minutes Intravenous Every 24 hours 01/12/12 1018 01/22/12 0928   01/12/12 0115   ciprofloxacin (CIPRO) IVPB 400  mg        400 mg 200 mL/hr over 60 Minutes Intravenous  Once 01/12/12 0105 01/12/12 0613   01/12/12 0115   metroNIDAZOLE (FLAGYL) IVPB 500 mg        500 mg 100 mL/hr over 60 Minutes Intravenous  Once 01/12/12 0105 01/12/12 0444         Current Facility-Administered Medications  Medication Dose Route Frequency Provider Last Rate Last Dose  . acetaminophen (TYLENOL) tablet 650 mg  650 mg Oral Q6H PRN Kalman Shan, MD   650 mg at 01/23/12 0447  . acyclovir ointment (ZOVIRAX) 5 %   Topical Q3H Shan Levans, MD   1 application at 01/23/12 [id number]  . antiseptic oral rinse (BIOTENE) solution 15 mL  1 application Mouth Rinse QID Konstantin Zubelevitskiy, MD   15 mL at 01/23/12 0400  . budesonide (PULMICORT) nebulizer solution 0.25 mg  0.25 mg Nebulization BID Kalman Shan, MD   0.25 mg at 01/23/12 1032  . carvedilol (COREG) tablet 3.125 mg   3.125 mg Oral BID WC [name]. Tyson Alias, MD   3.125 mg at 01/23/12 4696  . chlorhexidine (PERIDEX) 0.12 % solution 15 mL  15 mL Mouth/Throat BID Lonia Farber, MD   15 mL at 01/23/12 1042  . digoxin (LANOXIN) tablet 0.125 mg  0.125 mg Oral Daily [name], MD   0.125 mg at 01/23/12 1042  . feeding supplement (ENSURE CLINICAL STRENGTH) liquid 237 mL  237 mL Oral BID BM Jeoffrey Massed, RD   237 mL at 01/23/12 1043  . fentaNYL (SUBLIMAZE) injection 12.5 mcg  12.5 mcg Intravenous Q2H PRN [name]. Tyson Alias, MD   12.5 mcg at 01/22/12 2101  . furosemide (LASIX) tablet 20 mg  20 mg Oral Daily Cassell Clement, MD   20 mg at 01/23/12 1043  . insulin aspart (novoLOG) injection 0-20 Units  0-20 Units Subcutaneous Q4H Rakesh V. Vassie Loll, MD   3 Units at 01/22/12 2026  . insulin glargine (LANTUS) injection 15 Units  15 Units Subcutaneous Daily Christella Hartigan, PHARMD   15 Units at 01/22/12 0954  . ipratropium (ATROVENT) nebulizer solution 0.5 mg  0.5 mg Nebulization Q6H Shan Levans, MD   0.5 mg at 01/23/12 0750  . latanoprost (XALATAN) 0.005 % ophthalmic solution 1 drop  1 drop Both Eyes QHS Ron Parker, MD   1 drop at 01/22/12 2107  . levalbuterol (XOPENEX) nebulizer solution 0.63 mg  0.63 mg Nebulization Q6H Shan Levans, MD   0.63 mg at 01/23/12 0750  . lip balm (BLISTEX) ointment   Topical PRN Shan Levans, MD      . nitrofurantoin (MACRODANTIN) capsule 100 mg  100 mg Oral Daily Kalman Shan, MD      . pantoprazole (PROTONIX) EC tablet 40 mg  40 mg Oral Q1200 [name]. Tyson Alias, MD   40 mg at 01/22/12 1237  . warfarin (COUMADIN) tablet 3 mg  3 mg Oral ONCE-1800 Jessica Brown [place], PHARMD      . warfarin (COUMADIN) tablet 5 mg  5 mg Oral ONCE-1800 Gala Lewandowsky [place], PHARMD   5 mg at 01/22/12 1807  . DISCONTD: heparin ADULT infusion 100 units/ml (25000 units/250 ml)  2,050 Units/hr Intravenous Continuous Lonia Farber, MD 20.5 mL/hr at 01/23/12 0744 2,050 Units/hr  at 01/23/12 0744  . DISCONTD: lactated ringers infusion   Intravenous Continuous Lonia Farber, MD 20 mL/hr at 01/19/12 1043 20 mL at 01/19/12 1043    Assessment/Plan 1. Perforated diverticulitis, with intraabdominal abscess.  2.  Peritonitis  3. COPD/Post op VDRF, now resolved.  4.CAD, S/P CABG/Mitral valve replacement/2005, +MR, Afib on Coumadin/Hx. SCHF  5. 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  7.PCM/TNA  8. GERD  9.Acyclovir  10. Deconditioning  Plan:  Eventually transfer to IP rehab.  Doing well from surgical standpoint.  Will decide on removing drain, clear serous drainage today and 1/29.     LOS: 12 days    Kioni Stahl 01/23/2012

## 2012-01-23 NOTE — Progress Notes (Signed)
Pt had a 1.81 sec pause.  MD was paged at 1305. Will continue to monitor.

## 2012-01-23 NOTE — Progress Notes (Signed)
Nutrition Follow-up  Diet Order:  Low Fiber, TPN d/c'd on 1/30. PO intake has been >50% all meals. Appetite slowly improving per MD. Patient states that food doesn't taste very well and that is inhibiting his intake. Wife plans to bring food from home to help with appetite. S/p colon resection, sigmoid colectomy with colostomy. Meds: Scheduled Meds:   . acyclovir ointment   Topical Q3H  . antiseptic oral rinse  1 application Mouth Rinse QID  . carvedilol  3.125 mg Oral BID WC  . chlorhexidine  15 mL Mouth/Throat BID  . digoxin  0.125 mg Oral Daily  . feeding supplement  237 mL Oral BID BM  . furosemide  20 mg Oral Daily  . insulin aspart  0-20 Units Subcutaneous Q4H  . insulin glargine  15 Units Subcutaneous Daily  . ipratropium  0.5 mg Nebulization Q6H  . latanoprost  1 drop Both Eyes QHS  . levalbuterol  0.63 mg Nebulization Q6H  . pantoprazole  40 mg Oral Q1200  . warfarin  3 mg Oral ONCE-1800  . warfarin  5 mg Oral ONCE-1800   Continuous Infusions:   . lactated ringers 20 mL (01/19/12 1043)  . DISCONTD: heparin 2,050 Units/hr (01/23/12 0744)   PRN Meds:.acetaminophen, fentaNYL, lip balm, DISCONTD: acetaminophen, DISCONTD: acetaminophen, DISCONTD: HYDROmorphone  Labs:  CMP     Component Value Date/Time   NA 142 01/23/2012 0535   K 4.1 01/23/2012 0535   CL 107 01/23/2012 0535   CO2 27 01/23/2012 0535   GLUCOSE 122* 01/23/2012 0535   BUN 15 01/23/2012 0535   CREATININE 0.89 01/23/2012 0535   CALCIUM 8.5 01/23/2012 0535   PROT 4.8* 01/19/2012 0408   ALBUMIN 1.7* 01/19/2012 0408   AST 19 01/19/2012 0408   ALT 10 01/19/2012 0408   ALKPHOS 32* 01/19/2012 0408   BILITOT 0.3 01/19/2012 0408   GFRNONAA 83* 01/23/2012 0535   GFRAA >90 01/23/2012 0535     Intake/Output Summary (Last 24 hours) at 01/23/12 0939 Last data filed at 01/23/12 0731  Gross per 24 hour  Intake    650 ml  Output   1790 ml  Net  -1140 ml    Weight Status:  158 lbs, trending down slowly  Re-estimated needs:  1750-1950  kcal, 90-105 gm protein  Nutrition Dx:  Inadequate oral intake, improving slowly  Goal:  PO and TPN to meet >90% needs, was being met. TPN no longer being administered. New Goal: PO intake of meals and supplements will meet >90% of estimated nutrition needs.    Intervention:   1. Agree with Ensure Clinical Strength BID, continue 2. RD will continue to follow   Monitor:  PO intake, weight, lab's, I/O's   Rudean Haskell Pager #:  [phone]

## 2012-01-23 NOTE — Progress Notes (Signed)
Discussed earlier with Dr. Marchelle Gearing, and I reviewed strips suggesting VT.  Not clear to me this is, with 6 beats noted, but with faster rate, could easily be aberrated afib given underlying rhythm.  Agree with watching over the weekend on telemetry.  Over EF is 40% or better.  Labs reasonably good.  Carvedilol could be increased, but with COPD might be better to switch back to metoprolol if we increase.  For now, he can be monitored over the weekend as he might not be quite ready for rehab anyway.  The just pulled his abdominal drainage tube out and he has a fair amount of pain.    [name] 7:48 PM 01/23/2012

## 2012-01-23 NOTE — Progress Notes (Signed)
UR Completed.  [name] [name] [phone] 01/23/2012  

## 2012-01-23 NOTE — Progress Notes (Signed)
The patient is seen and examined. I agree with the assessment and care plan as outlined by Mr. Marlyne Beards.  Will have LLQ JP drain removed.  OK for rehab from surgical standpoint.   Angelia Mould. Derrell Lolling, M.D., [facility] General and Minimally invasive Surgery Breast and Colorectal Surgery Office:   [phone] Pager:   [phone]

## 2012-01-24 DIAGNOSIS — N134 Hydroureter: Secondary | ICD-10-CM | POA: Diagnosis not present

## 2012-01-24 DIAGNOSIS — I5023 Acute on chronic systolic (congestive) heart failure: Secondary | ICD-10-CM | POA: Diagnosis not present

## 2012-01-24 DIAGNOSIS — J449 Chronic obstructive pulmonary disease, unspecified: Secondary | ICD-10-CM | POA: Diagnosis not present

## 2012-01-24 DIAGNOSIS — K651 Peritoneal abscess: Secondary | ICD-10-CM | POA: Diagnosis not present

## 2012-01-24 DIAGNOSIS — I4891 Unspecified atrial fibrillation: Secondary | ICD-10-CM | POA: Diagnosis not present

## 2012-01-24 LAB — GLUCOSE, CAPILLARY
Glucose-Capillary: 94 mg/dL (ref 70–99)
Glucose-Capillary: 105 mg/dL — ABNORMAL HIGH (ref 70–99)
Glucose-Capillary: 110 mg/dL — ABNORMAL HIGH (ref 70–99)
Glucose-Capillary: 139 mg/dL — ABNORMAL HIGH (ref 70–99)

## 2012-01-24 LAB — BASIC METABOLIC PANEL
BUN: 13 mg/dL (ref 6–23)
Calcium: 8.5 mg/dL (ref 8.4–10.5)
Chloride: 109 mEq/L (ref 96–112)
Creatinine, Ser: 0.79 mg/dL (ref 0.50–1.35)
GFR calc Af Amer: >90 mL/min (ref >90)
GFR calc non Af Amer: 88 mL/min — ABNORMAL LOW (ref >90)

## 2012-01-24 LAB — CBC
MCHC: 32.2 g/dL (ref 30.0–36.0)
Platelets: 261 10*3/uL (ref 150–400)
RDW: 15.7 % — ABNORMAL HIGH (ref 11.5–15.5)
WBC: 6.3 10*3/uL (ref 4.0–10.5)

## 2012-01-24 LAB — PRO B NATRIURETIC PEPTIDE: Pro B Natriuretic peptide (BNP): 1636 pg/mL — ABNORMAL HIGH (ref 0–125)

## 2012-01-24 LAB — PROTIME-INR
INR: 3.54 — ABNORMAL HIGH (ref 0.00–1.49)
Prothrombin Time: 36 seconds — ABNORMAL HIGH (ref 11.6–15.2)

## 2012-01-24 LAB — CARDIAC PANEL(CRET KIN+CKTOT+MB+TROPI)
Relative Index: INVALID (ref 0.0–2.5)
Total CK: 69 U/L (ref 7–232)

## 2012-01-24 LAB — APTT: aPTT: 60 seconds — ABNORMAL HIGH (ref 24–37)

## 2012-01-24 MED ORDER — CARVEDILOL 6.25 MG PO TABS
6.2500 mg | ORAL_TABLET | Freq: Two times a day (BID) | ORAL | Status: DC
  Administered 2012-01-24 – 2012-01-26 (×4): 6.25 mg via ORAL
  Filled 2012-01-24 (×6): qty 1

## 2012-01-24 NOTE — Progress Notes (Signed)
ANTICOAGULATION CONSULT NOTE - Follow Up Consult  Pharmacy Consult for Coumadin Indication: atrial fibrillation and MVR  Assessment: 71 y/o male with h/o MVR/Afib for anticoagulation.  INR 2.54--> 3.54 - now slightly supratherapeutic. Appears to be absorbing drug s/p colectomy or may still be seeing affects of IV dose on 1/30.  Home dose 2.5mg  qday except 5mg  W/Sat. H/H low but stable. Platelets stable. No issues noted.   Will hold Coumadin tonight as INR increased significantly overnight and may still rise tomorrow.     Goal of Therapy:  INR 2.5-3.5   Plan:  Hold Coumadin today x 1.   Follow-up INR, CBC in AM.     Allergies  Allergen Reactions  . Codeine     REACTION: hives  . Sulfonamide Derivatives     REACTION: hives  . Tylenol-Codeine     REACTION: hives    Patient Measurements: Height: 5' 4.8" (164.6 cm) Weight: 157 lb 10.1 oz (71.5 kg) IBW/kg (Calculated) : 61.04   Vital Signs: Temp: 98 F (36.7 C) (02/02 0630) Temp src: Oral (02/02 0630) BP: 123/72 mmHg (02/02 0630) Pulse Rate: 88  (02/02 0630)  Labs:  Basename 01/24/12 0615 01/24/12 0600 01/23/12 0535 01/22/12 0500 01/21/12 1455  HGB -- 8.9* 9.2* -- --  HCT -- 27.6* 28.5* 28.7* --  PLT -- 261 253 224 --  APTT -- 60* -- -- --  LABPROT -- 36.0* 27.8* 22.6* --  INR -- 3.54* 2.54* 1.95* --  HEPARINUNFRC -- -- 0.56 0.56 0.32  CREATININE -- 0.79 0.89 0.84 --  CKTOTAL 69 -- -- -- --  CKMB 2.7 -- -- -- --  TROPONINI <0.30 -- -- -- --   Estimated Creatinine Clearance: 72 ml/min (by C-G formula based on Cr of 0.79).   Medication:  Infusions:     . DISCONTD: heparin Stopped (01/23/12 1048)  . DISCONTD: lactated ringers Stopped (01/23/12 1047)    Link Snuffer, PharmD, BCPS 01/24/2012, 7:28 AM

## 2012-01-24 NOTE — Progress Notes (Signed)
Subjective:  C/o mild back pain,  Ambulating.  No chest pain or SOB.    Objective:  Vital Signs in the last 24 hours: BP 110/50  Pulse 85  Temp(Src) 98 F (36.7 C) (Oral)  Resp 18  Ht 5' 4.8" (1.646 m)  Wt 71.5 kg (157 lb 10.1 oz)  BMI 26.39 kg/m2  SpO2 93%  Physical Exam: Pleasant WM NAD Lungs:  Clear to A&P Cardiac:  irregular rhythm, normal S1 and S2, no S3, no murmur  Extremities:  No edema present  Intake/Output from previous day: 02/01 0701 - 02/02 0700 In: 840 [P.O.:840] Out: 775 [Urine:725; Stool:50]  Lab Results: Basic Metabolic Panel:  Basename 01/24/12 0600 01/23/12 0535  NA 144 142  K 4.0 4.1  CL 109 107  CO2 28 27  GLUCOSE 113* 122*  BUN 13 15  CREATININE 0.79 0.89    CBC:  Basename 01/24/12 0600 01/23/12 0535  WBC 6.3 5.2  NEUTROABS -- --  HGB 8.9* 9.2*  HCT 27.6* 28.5*  MCV 89.9 90.8  PLT 261 253    PROTIME: Lab Results  Component Value Date   INR 3.54* 01/24/2012   INR 2.54* 01/23/2012   INR 1.95* 01/22/2012    Telemetry: Reviewed a fib with some rapid response post surgery  Assessment/Plan:  1. S/p MVR and CABG stable 2. Chronic a fib rate mild increased 3. Mild systolic CHF stable  REC:  Increase beta blockers to help with exercise heart rate.     [name].  MD [facility] 01/24/2012, 10:12 AM

## 2012-01-24 NOTE — Progress Notes (Signed)
Name: [name] MRN: [id number] DOB: 02/03/1941    LOS: 13  PCCM PROGRESS  NOTE  History of Present Illness: Known COPD , also on nitrafurantoin since dec 2012 with itnermittent self cath at home. Then, admitted to [facility] with COPD exacerbation on 1/14.  Developed abdominal pain.  CT scan on 1/18 demonstrated diverticulitis and free air.  Started on antibiotics.  Transferred to [facility] on 1/20.  Repeat  CT scan demonstrated pneumoperitoneum and developing abscess.  Brought to OR on 1/21 for exploratory laparotomy, drainage of intraabdominal abscess, sigmoid colectomy and colostomy.  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 Hypokinesis of the inferior myocardium. Hypokinesis of the apical myocardium. Hypokinesis of the lateral myocardium Mitral valve: A prosthesis was present and functioning normally 1/20  Abdominal CT>>>pneumoperitoneum / intraabdominal abscess 1/21  Exploratory laparotomy, drainage of intraabdominal abscess, sigmoid colectomy and colostomy 1/31 - floor 01/23/12 - ready to go to rehab, foley out, central line out  SUBJ: Transfer to Rehab held due to [place]. Cardiology note reviewed- Thanks. Family visiting. Acute needs denied.  Vital Signs: Temp:  [97.7 F (36.5 C)-98 F (36.7 C)] 98 F (36.7 C) (02/02 0630) Pulse Rate:  [76-112] 85  (02/02 1005) Resp:  [18-20] 18  (02/02 1005) BP: (93-123)/(50-72) 110/50 mmHg (02/02 1005) SpO2:  [93 %-99 %] 93 % (02/02 0852) Weight:  [71.5 kg (157 lb 10.1  oz)] 71.5 kg (157 lb 10.1 oz) (02/02 0500) I/O last 3 completed shifts: In: 840 [P.O.:840] Out: 1925 [Urine:1825; Drains:50; Stool:50]  Physical Examination: General:  Awake and alert, no distress. Very deconditioned Neuro: awake and alert, interactive, non focal Neck:  No JVD   Cardiovascular: Irregularly irregular rate, no murmurs, rate wnl Lungs:   scattered rhonchi Abdomen:  Viable colostomy, surgically dressing clean / dry / intact Musculoskeletal: No edema Skin:  Crusted scab right lower lip.  Labs and Imaging:   Dg Chest 2 View  01/23/2012  *RADIOLOGY REPORT*  Clinical Data: COPD and shortness of breath.  CHEST - 2 VIEW  Comparison: 01/21/2012  Findings: The cardiopericardial silhouette is enlarged. Interstitial markings are diffusely coarsened with chronic features.  No focal airspace consolidation or overt pulmonary edema.  Right IJ central line tip projects at the mid SVC level.  IMPRESSION: No focal airspace consolidation or overt airspace pulmonary edema.  Original Report Authenticated By: ERIC A. MANSELL, M.D.    Lab 01/24/12 0600 01/23/12 0535 01/22/12 0500  NA 144 142 139  K 4.0 4.1 3.9  CL 109 107 106  CO2 28 27 25   BUN 13 15 17   CREATININE 0.79 0.89 0.84  GLUCOSE 113* 122* 115*    Lab 01/24/12 0600 01/23/12 0535 01/22/12 0500  HGB 8.9* 9.2* 9.4*  HCT 27.6* 28.5* 28.7*  WBC 6.3 5.2 6.1  PLT 261 253 224     Assessment and Plan:  Intraabdominal abscess, s/p eploratory laparotomy, drainage of intraabdominal abscess, sigmoid colectomy and colostomy.  No signs of sepsis on 01/23/12. S/p completeion abx Rx and clinically well  except for mild abdominal pain prn  Lab 01/24/12 0600 01/23/12 0535 01/22/12 0500 01/21/12 0450 01/20/12 0418  WBC 6.3 5.2 6.1 5.7 6.7  No results found for this basename: PROCALCITON:5 in the last 168 hours PLAN  - monitor clinically  - advance po diet per CCS  - will need CCS followup in rehab and in outpatient - fentanyl prn for  abdominal pain   Post-operative respiratory failure RESOLVED.  History of COPD.   No results found for this basename: PHART:5,PCO2:5,PCO2ART:5,PO2ART:5 in the last 168 hours PLAN cotninue xopenex and atrovent q6h scheduled Start and continue pulmicort neb bid -Needs opd pulmonary with [facility] Fries in [place], [place]   History of CAD s/p CABG.  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   Ischemic cardiomyopathy.  - EF 01/03/12:  40% - 45%. Hypokinesis of the inferior myocardium. Hypokinesis of the apical myocardium. Hypokinesis of the lateral myocardium. Atrial fibrillation -anticoagulation Dyslipidemia     Lab 01/24/12 0615  TROPONINI <0.30     Lab 01/24/12 0600 01/23/12 0535 01/22/12 0500 01/21/12 0450 01/20/12 0418  INR 3.54* 2.54* 1.95* 1.44 1.53*    > on 01/23/12:   No signs of active ischemia but did have short non-sustained V Tach 1/31 and 2/1 with normal mag/phos/K.Otherwise rate controlled. INR therapeutic 2's. On heparin gtt on 2/1 PLAN DC Heparin. Continue coumadin per pharmacy Continue digoxin, check level 01/24/12 Hold off trasnsfer to rehab on 01/23/12 and keep through to 01/26/12 so cards can get better handle on [facility] Cards following and per Dr Riley Kill on 01/23/12 will follow patient in ipatient rehab In rehab, will need periodic dig level checked Needs opd cards follow-up No change to plans at this time.   Hematemesis is on the record but no active hematemesis noted and no bloody discharge from NG since coming to ICU.  This is on the background of Coumadin, ASA and Indocin.  Stable hemoglobin.  Lab 01/24/12 0600 01/23/12 0535 01/22/12 0500 01/21/12 0450 01/20/12 0418  HCT 27.6* 28.5* 28.7* 27.7* 27.2*  PLAN -->  Protonix to qday -> PRBC for hgb < 7gm% or < 8gm% if in active coronary ischemia    Diabetes / hyperglycemia  Lab 01/24/12 0755 01/24/12 0637 01/24/12 0348 01/24/12 0059 01/23/12 2005  GLUCAP 110* 116* 105* 94 194*   -->  SSI HG  protocol - oral agents when fully eating po   Protein Calorie Malnutrition Severe -->off tpn since 1/30 atleast. Deconditioned  PLAN  - continue po diet  - tx to inpatient rehab    Obstructive Uropathy On 01/22/12 Dr Marchelle Gearing  discussed with his urologist Dr Achilles Dunk at [place]. 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 REquires intermittent self cath otheriwse patient will obstruct. If on intermittent self cath, he strongly feels patient needs low dose nitrafurantoin. This is despite risk for opulmonary toxicity which is low but risk of recurrent UTI which is high without nitrafurantoin. On 2/1/1/3 I have updated patient of plan and he is agreeable to intermittent RN cath foley daily with nitrafurantoin.  DISPO Keep inpatient  Cancel rehab transfer 01/23/12 due to non sutained v tach Reassess for rehab transfer 02/05/12  Family updated - wife, son, patient by Dr MR    01/24/2012 11:14 AM

## 2012-01-25 ENCOUNTER — Other Ambulatory Visit: Payer: Self-pay | Admitting: Internal Medicine

## 2012-01-25 DIAGNOSIS — K651 Peritoneal abscess: Secondary | ICD-10-CM

## 2012-01-25 DIAGNOSIS — N134 Hydroureter: Secondary | ICD-10-CM

## 2012-01-25 DIAGNOSIS — J449 Chronic obstructive pulmonary disease, unspecified: Secondary | ICD-10-CM | POA: Diagnosis not present

## 2012-01-25 DIAGNOSIS — I4891 Unspecified atrial fibrillation: Secondary | ICD-10-CM

## 2012-01-25 DIAGNOSIS — I5023 Acute on chronic systolic (congestive) heart failure: Secondary | ICD-10-CM | POA: Diagnosis not present

## 2012-01-25 LAB — GLUCOSE, CAPILLARY
Glucose-Capillary: 136 mg/dL — ABNORMAL HIGH (ref 70–99)
Glucose-Capillary: 118 mg/dL — ABNORMAL HIGH (ref 70–99)
Glucose-Capillary: 195 mg/dL — ABNORMAL HIGH (ref 70–99)
Glucose-Capillary: 194 mg/dL — ABNORMAL HIGH (ref 70–99)

## 2012-01-25 LAB — CBC
Hemoglobin: 8.8 g/dL — ABNORMAL LOW (ref 13.0–17.0)
MCV: 90 fL (ref 78.0–100.0)
Platelets: 275 10*3/uL (ref 150–400)
RBC: 2.99 MIL/uL — ABNORMAL LOW (ref 4.22–5.81)
WBC: 5.3 10*3/uL (ref 4.0–10.5)

## 2012-01-25 LAB — PROTIME-INR: Prothrombin Time: 33.8 seconds — ABNORMAL HIGH (ref 11.6–15.2)

## 2012-01-25 MED ORDER — INSULIN ASPART 100 UNIT/ML ~~LOC~~ SOLN
0.0000 [IU] | Freq: Three times a day (TID) | SUBCUTANEOUS | Status: DC
  Administered 2012-01-25: 7 [IU] via SUBCUTANEOUS
  Administered 2012-01-25 – 2012-01-26 (×2): 4 [IU] via SUBCUTANEOUS
  Administered 2012-01-26: 3 [IU] via SUBCUTANEOUS
  Administered 2012-01-26: 4 [IU] via SUBCUTANEOUS
  Administered 2012-01-26: 3 [IU] via SUBCUTANEOUS

## 2012-01-25 MED ORDER — WARFARIN SODIUM 2.5 MG PO TABS
2.5000 mg | ORAL_TABLET | Freq: Once | ORAL | Status: AC
  Administered 2012-01-25: 2.5 mg via ORAL
  Filled 2012-01-25: qty 1

## 2012-01-25 NOTE — Progress Notes (Signed)
13 Days Post-Op  Subjective: No complaints  Objective: Vital signs in last 24 hours: Temp:  [97.6 F (36.4 C)-98.7 F (37.1 C)] 98.6 F (37 C) (02/03 0556) Pulse Rate:  [83-103] 83  (02/03 0556) Resp:  [19] 19  (02/03 0556) BP: (110-129)/(55-84) 120/60 mmHg (02/03 0556) SpO2:  [94 %-96 %] 95 % (02/03 0723) Weight:  [155 lb 3.3 oz (70.4 kg)] 155 lb 3.3 oz (70.4 kg) (02/03 0556) Last BM Date: 01/24/12  Intake/Output from previous day: 02/02 0701 - 02/03 0700 In: 300 [P.O.:300] Out: 550 [Urine:550] Intake/Output this shift: Total I/O In: 120 [P.O.:120] Out: -   GI: soft, nontender. open abd wound clean. ostomy pink and productive  Lab Results:   Basename 01/25/12 0500 01/24/12 0600  WBC 5.3 6.3  HGB 8.8* 8.9*  HCT 26.9* 27.6*  PLT 275 261   BMET  Basename 01/24/12 0600 01/23/12 0535  NA 144 142  K 4.0 4.1  CL 109 107  CO2 28 27  GLUCOSE 113* 122*  BUN 13 15  CREATININE 0.79 0.89  CALCIUM 8.5 8.5   PT/INR  Basename 01/25/12 0500 01/24/12 0600  LABPROT 33.8* 36.0*  INR 3.27* 3.54*   ABG No results found for this basename: PHART:2,PCO2:2,PO2:2,HCO3:2 in the last 72 hours  Studies/Results: No results found.  Anti-infectives: Anti-infectives     Start     Dose/Rate Route Frequency Ordered Stop   01/12/12 2200   ertapenem (INVANZ) 1 g in sodium chloride 0.9 % 50 mL IVPB  Status:  Discontinued        1 g 100 mL/hr over 30 Minutes Intravenous Every 24 hours 01/12/12 2106 01/12/12 2115   01/12/12 1800   ciprofloxacin (CIPRO) IVPB 400 mg  Status:  Discontinued        400 mg 200 mL/hr over 60 Minutes Intravenous Every 12 hours 01/12/12 0102 01/12/12 1018   01/12/12 1400   metroNIDAZOLE (FLAGYL) IVPB 500 mg  Status:  Discontinued        500 mg 100 mL/hr over 60 Minutes Intravenous Every 8 hours 01/12/12 0102 01/12/12 1018   01/12/12 1030   ertapenem (INVANZ) 1 g in sodium chloride 0.9 % 50 mL IVPB  Status:  Discontinued        1 g 100 mL/hr over 30  Minutes Intravenous Every 24 hours 01/12/12 1018 01/22/12 0928   01/12/12 0115   ciprofloxacin (CIPRO) IVPB 400 mg        400 mg 200 mL/hr over 60 Minutes Intravenous  Once 01/12/12 0105 01/12/12 0613   01/12/12 0115   metroNIDAZOLE (FLAGYL) IVPB 500 mg        500 mg 100 mL/hr over 60 Minutes Intravenous  Once 01/12/12 0105 01/12/12 0444          Assessment/Plan: s/p Procedure(s): COLON RESECTION continue dressing changes Possibly to rehab tomorrow  LOS: 14 days    TOTH III,PAUL S 01/25/2012

## 2012-01-25 NOTE — Progress Notes (Signed)
Subjective:  Ambulating.  No chest pain or SOB.    Objective:  Vital Signs in the last 24 hours: BP 120/60  Pulse 83  Temp(Src) 98.6 F (37 C) (Oral)  Resp 19  Ht 5' 4.8" (1.646 m)  Wt 70.4 kg (155 lb 3.3 oz)  BMI 25.99 kg/m2  SpO2 95%  Physical Exam: Pleasant WM NAD Lungs:  Clear to A&P Cardiac:  irregular rhythm, normal S1 and S2, no S3, no murmur  Extremities:  No edema present  Intake/Output from previous day: 02/02 0701 - 02/03 0700 In: 300 [P.O.:300] Out: 550 [Urine:550]  Lab Results: Basic Metabolic Panel:  Basename 01/24/12 0600 01/23/12 0535  NA 144 142  K 4.0 4.1  CL 109 107  CO2 28 27  GLUCOSE 113* 122*  BUN 13 15  CREATININE 0.79 0.89   CBC:  Basename 01/25/12 0500 01/24/12 0600  WBC 5.3 6.3  NEUTROABS -- --  HGB 8.8* 8.9*  HCT 26.9* 27.6*  MCV 90.0 89.9  PLT 275 261    PROTIME: Lab Results  Component Value Date   INR 3.27* 01/25/2012   INR 3.54* 01/24/2012   INR 2.54* 01/23/2012    Telemetry: Atrial fibrillation.  No VT noted.  Assessment/Plan:  1. S/p MVR and CABG stable 2. Chronic a fib rate mild increased 3. Mild systolic CHF stable 4.  NO VT noted  REC:  Exercise heart rate is better.  Should be able to go to rehab tomorrow from cardiac viewpoint.     Darden Palmer.  MD [facility] 01/25/2012, 10:51 AM

## 2012-01-25 NOTE — Progress Notes (Signed)
Name: [name] MRN: [id number] DOB: [birth year]    LOS: 14  PCCM PROGRESS  NOTE  History of Present Illness: Known COPD , also on nitrafurantoin since dec 2012 with itnermittent self cath at home. Then, admitted to [facility] with COPD exacerbation on 1/14.  Developed abdominal pain.  CT scan on 1/18 demonstrated diverticulitis and free air.  Started on antibiotics.  Transferred to [facility] on 1/20.  Repeat  CT scan demonstrated pneumoperitoneum and developing abscess.  Brought to OR on 1/21 for exploratory laparotomy, drainage of intraabdominal abscess, sigmoid colectomy and colostomy.  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 Hypokinesis of the inferior myocardium. Hypokinesis of the apical myocardium. Hypokinesis of the lateral myocardium Mitral valve: A prosthesis was present and functioning normally 1/20  Abdominal CT>>>pneumoperitoneum / intraabdominal abscess 1/21  Exploratory laparotomy, drainage of intraabdominal abscess, sigmoid colectomy and colostomy 1/31 - floor 01/23/12 - ready to go to rehab, foley out, central line out  SUBJ: Transfer to Rehab held due to [place]. Cardiology note reviewed- Thanks. Family visiting. Acute needs denied. Discussed w/ nurse. Not using CVL and no acute events. Ambulating some in room.  Vital Signs: Temp:  [97.6 F (36.4 C)-98.7 F (37.1 C)] 98.6 F (37 C) (02/03 0556) Pulse Rate:  [83-103] 83  (02/03 0556) Resp:  [18-19] 19  (02/03 0556) BP: (110-129)/(50-84) 120/60 mmHg  (02/03 0556) SpO2:  [94 %-96 %] 95 % (02/03 0723) Weight:  [70.4 kg (155 lb 3.3 oz)] 70.4 kg (155 lb 3.3 oz) (02/03 0556) I/O last 3 completed shifts: In: 420 [P.O.:420] Out: 850 [Urine:850]  Physical Examination: General:  Awake and alert, no distress. Very deconditioned Neuro: awake and alert, interactive, non focal Neck:  No JVD CVL in R  Cardiovascular: Irregularly irregular rate, no murmurs, rate wnl Lungs:   Faint crackles, unlabored at rest Abdomen:  Viable colostomy, surgically dressing clean / dry / intact Musculoskeletal: No edema Skin:  Crusted scab right lower lip- he identifies as fever blister typical x 20 years.  Labs and Imaging:   No results found.  Lab 01/24/12 0600 01/23/12 0535 01/22/12 0500  NA 144 142 139  K 4.0 4.1 3.9  CL 109 107 106  CO2 28 27 25   BUN 13 15 17   CREATININE 0.79 0.89 0.84  GLUCOSE 113* 122* 115*    Lab 01/25/12 0500 01/24/12 0600 01/23/12 0535  HGB 8.8* 8.9* 9.2*  HCT 26.9* 27.6* 28.5*  WBC 5.3 6.3 5.2  PLT 275 261 253     Assessment and Plan:  Intraabdominal abscess, s/p eploratory laparotomy, drainage of intraabdominal abscess, sigmoid colectomy and colostomy.  No signs of sepsis on 01/23/12. S/p completeion abx Rx and clinically well except for mild abdominal pain prn  Lab 01/25/12 0500 01/24/12 0600 01/23/12 0535 01/22/12 0500 01/21/12 0450  WBC 5.3 6.3 5.2 6.1 5.7  No results found for this basename: PROCALCITON:5 in the last 168 hours PLAN  - monitor clinically  - advance po diet per CCS  - will need  CCS followup in rehab and in outpatient - fentanyl prn for abdominal pain   Post-operative respiratory failure RESOLVED.  History of COPD.   No results found for this basename: PHART:5,PCO2:5,PCO2ART:5,PO2ART:5 in the last 168 hours PLAN cotinue xopenex and atrovent q6h scheduled Start and continue pulmicort neb bid -Needs opd pulmonary with [facility] Kendrick Fries in [place], [place]   History of CAD s/p CABG.  History of  MR with mechanical valve replacement.   Ischemic cardiomyopathy.  - EF 01/03/12:  40% - 45%. Hypokinesis of the inferior myocardium. Hypokinesis of the apical myocardium. Hypokinesis of the lateral myocardium. Atrial fibrillation -anticoagulation. Rate controlled.  Dyslipidemia     Lab 01/24/12 0615  TROPONINI <0.30     Lab 01/25/12 0500 01/24/12 0600 01/23/12 0535 01/22/12 0500 01/21/12 0450  INR 3.27* 3.54* 2.54* 1.95* 1.44    > on 01/23/12:   No signs of active ischemia but did have short non-sustained V Tach 1/31 and 2/1 with normal mag/phos/K.Otherwise rate controlled. INR therapeutic 2's. On heparin gtt on 2/1  Herpes labialis- healing  PLAN DC Heparin. Continue coumadin per pharmacy Continue digoxin, check level 01/24/12 Hold off trasnsfer to rehab on 01/23/12 and keep through to 01/26/12 so cards can get better handle on NSVTACH> stable over weekend. [place] Cards following and per Dr Riley Kill on 01/23/12 will follow patient in ipatient rehab In rehab, will need periodic dig level checked Needs opd cards follow-up No change to plans at this time.   Hematemesis is on the record but no active hematemesis noted and no bloody discharge from NG since coming to ICU.  This is on the background of Coumadin, ASA and Indocin.  Stable hemoglobin.  Lab 01/25/12 0500 01/24/12 0600 01/23/12 0535 01/22/12 0500 01/21/12 0450  HCT 26.9* 27.6* 28.5* 28.7* 27.7*  PLAN -->  Protonix to qday -> PRBC for hgb < 7gm% or < 8gm% if in active coronary ischemia  Diabetes / hyperglycemia  Lab 01/25/12 0745 01/25/12 0414 01/25/12 0028 01/24/12 2052 01/24/12 1641  GLUCAP 118* 136* 151* 139* 147*   -->  SSI HG protocol - oral agents when fully eating po- ->Changing [facility]G to [facility] & HS   Protein Calorie Malnutrition Severe -->off tpn since 1/30 atleast. Deconditioned  PLAN  - continue po diet  - tx to inpatient rehab    Obstructive Uropathy On 01/22/12 Dr Marchelle Gearing  discussed with his urologist Dr  Achilles Dunk at [place]. 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 REquires intermittent self cath otheriwse patient will obstruct. If on intermittent self cath, he strongly feels patient needs low dose nitrafurantoin. This is despite risk for opulmonary toxicity which is low but risk of recurrent UTI which is high without nitrafurantoin. On 2/1/1/3 I have updated patient of plan and he is agreeable to intermittent RN cath foley daily with nitrafurantoin.  DISPO Keep inpatient  Cancel rehab transfer 01/23/12 due to non sutained v tach Reassess for rehab transfer 02/05/12      C.D. Hanna Aultman, MD 01/25/2012 9:23 AM

## 2012-01-25 NOTE — Progress Notes (Signed)
Pts and Wifes concerns with wound addressed with MD.MD  will assess in AM. Kandis Fantasia, RN

## 2012-01-25 NOTE — Progress Notes (Addendum)
ANTICOAGULATION CONSULT NOTE - Follow Up Consult  Pharmacy Consult for Coumadin Indication: atrial fibrillation and MVR  Assessment: 71 y/o male with h/o MVR/Afib for anticoagulation.  INR therapeutic: 3.54-->3.27.  Hgb slowly trending down, stable last 2 days.  Plts OK.  No issues noted.    Home dose 2.5mg  qday except 5mg  W/Sat. H/H low but stable.   Goal of Therapy:  INR 2.5-3.5   Plan:  Coumadin 2.5mg  po x 1 tonight.    Follow-up INR, CBC in AM.     Allergies  Allergen Reactions  . Codeine     REACTION: hives  . Sulfonamide Derivatives     REACTION: hives  . Tylenol-Codeine     REACTION: hives    Patient Measurements: Height: 5' 4.8" (164.6 cm) Weight: 155 lb 3.3 oz (70.4 kg) IBW/kg (Calculated) : 61.04   Vital Signs: Temp: 98.6 F (37 C) (02/03 0556) Temp src: Oral (02/03 0556) BP: 120/60 mmHg (02/03 0556) Pulse Rate: 83  (02/03 0556)  Labs:  Basename 01/25/12 0500 01/24/12 0615 01/24/12 0600 01/23/12 0535  HGB 8.8* -- 8.9* --  HCT 26.9* -- 27.6* 28.5*  PLT 275 -- 261 253  APTT -- -- 60* --  LABPROT 33.8* -- 36.0* 27.8*  INR 3.27* -- 3.54* 2.54*  HEPARINUNFRC -- -- -- 0.56  CREATININE -- -- 0.79 0.89  CKTOTAL -- 69 -- --  CKMB -- 2.7 -- --  TROPONINI -- <0.30 -- --   Estimated Creatinine Clearance: 72 ml/min (by C-G formula based on Cr of 0.79).   Medication:  Infusions:    Haynes Hoehn, PharmD 01/25/2012 8:28 AM  Pager: [phone]

## 2012-01-25 NOTE — Progress Notes (Signed)
Physical Therapy Treatment Patient Details Name: [name] MRN: [id number] DOB: 06-25-1941 Today's Date: 01/25/2012  PT Assessment/Plan  PT - Assessment/Plan PT Plan: Discharge plan remains appropriate PT Frequency: Min 3X/week Equipment Recommended: Defer to next venue PT Goals  Acute Rehab PT Goals PT Goal: Rolling Supine to Right Side - Progress:  (not assessed today; discussed roll/side/sit for abd safety) PT Goal: Sit to Stand - Progress: Met (with use of UE assist ) PT Goal: Stand to Sit - Progress: Met (with UE assist) PT Transfer Goal: Bed to Chair/Chair to Bed - Progress: Met PT Goal: Ambulate - Progress: Progressing toward goal PT Goal: Up/Down Stairs - Progress: Other (comment) (not addressed today)  PT Treatment Precautions/Restrictions  Precautions Precautions:  (abdominal incision) Precaution Comments: caution with bed mobility and transitional movements Required Braces or Orthoses: No Restrictions Weight Bearing Restrictions: No Mobility (including Balance) Bed Mobility Bed Mobility: No Transfers Transfers: Yes Sit to Stand: 6: Modified independent (Device/Increase time);With upper extremity assist;From chair/3-in-1 Sit to Stand Details (indicate cue type and reason): cues for speed and control especially to protect abdomen and to attend to stability upon rise Stand to Sit: 6: Modified independent (Device/Increase time);With upper extremity assist;To chair/3-in-1 Stand to Sit Details: cues to control speed using UE's Ambulation/Gait Ambulation/Gait: Yes Ambulation/Gait Assistance: 4: Min assist Ambulation/Gait Assistance Details (indicate cue type and reason): assist to prevent loss of balance during head turns and directional changes (worse when dual task and poor attention to stability) Ambulation Distance (Feet): 125 Feet Assistive device: 1 person hand held assist Gait Pattern: Antalgic;Decreased trunk rotation (decr knee flexion/hip extension bil) Gait  velocity: 33 feet/14.5 sec = 2.27 ft/sec Stairs: No Wheelchair Mobility Wheelchair Mobility: No  Posture/Postural Control Posture/Postural Control: Postural limitations Postural Limitations: guarded secondary to abdominal incision; increased low back complaints walking with 2-hand device Balance Balance Assessed: Yes Static Standing Balance Static Standing - Balance Support: No upper extremity supported Static Standing - Level of Assistance: 3: Mod assist Static Standing - Comment/# of Minutes: 25 sec Rhomberg EO; 3 second single leg stance (single leg stance <10 sec is associated with fall risk) Dynamic Gait Index Level Surface: Mild Impairment Change in Gait Speed: Mild Impairment Gait with Horizontal Head Turns: Mild Impairment Gait with Vertical Head Turns: Mild Impairment Gait and Pivot Turn: Mild Impairment Step Over Obstacle: Mild Impairment Step Around Obstacles: Mild Impairment Steps: Mild Impairment Total Score: 16  Exercise    End of Session PT - End of Session Activity Tolerance: Patient limited by pain Patient left: in chair;with call bell in reach;with family/visitor present Nurse Communication: Mobility status for transfers;Mobility status for ambulation General Behavior During Session: [facility] for tasks performed Cognition: [facility] for tasks performed  Dennis Bast 01/25/2012, 11:46 AM

## 2012-01-25 NOTE — Progress Notes (Signed)
Pt. Alarmed 9 beats v-tach.  Pt. In room sitting on side of bed.  Pt has no complaints of pain or shortness of breath.  BP 129/55 HR 96.  Will continue to monitor patient.  Bartholomew Boards

## 2012-01-26 ENCOUNTER — Inpatient Hospital Stay (HOSPITAL_COMMUNITY): Payer: Medicare Other

## 2012-01-26 DIAGNOSIS — J449 Chronic obstructive pulmonary disease, unspecified: Secondary | ICD-10-CM | POA: Diagnosis not present

## 2012-01-26 DIAGNOSIS — K651 Peritoneal abscess: Secondary | ICD-10-CM | POA: Diagnosis not present

## 2012-01-26 DIAGNOSIS — N134 Hydroureter: Secondary | ICD-10-CM | POA: Diagnosis not present

## 2012-01-26 DIAGNOSIS — I4891 Unspecified atrial fibrillation: Secondary | ICD-10-CM | POA: Diagnosis not present

## 2012-01-26 DIAGNOSIS — J9819 Other pulmonary collapse: Secondary | ICD-10-CM | POA: Diagnosis not present

## 2012-01-26 DIAGNOSIS — Z09 Encounter for follow-up examination after completed treatment for conditions other than malignant neoplasm: Secondary | ICD-10-CM | POA: Diagnosis not present

## 2012-01-26 LAB — CBC
Hemoglobin: 8.8 g/dL — ABNORMAL LOW (ref 13.0–17.0)
Platelets: 267 10*3/uL (ref 150–400)
RBC: 2.99 MIL/uL — ABNORMAL LOW (ref 4.22–5.81)
WBC: 3.8 10*3/uL — ABNORMAL LOW (ref 4.0–10.5)

## 2012-01-26 LAB — GLUCOSE, CAPILLARY
Glucose-Capillary: 224 mg/dL — ABNORMAL HIGH (ref 70–99)
Glucose-Capillary: 179 mg/dL — ABNORMAL HIGH (ref 70–99)

## 2012-01-26 LAB — PROTIME-INR
INR: 2.88 — ABNORMAL HIGH (ref 0.00–1.49)
Prothrombin Time: 30.6 seconds — ABNORMAL HIGH (ref 11.6–15.2)

## 2012-01-26 MED ORDER — WARFARIN SODIUM 5 MG PO TABS
5.0000 mg | ORAL_TABLET | Freq: Once | ORAL | Status: AC
  Administered 2012-01-26: 5 mg via ORAL
  Filled 2012-01-26: qty 1

## 2012-01-26 MED ORDER — METFORMIN HCL 500 MG PO TABS
500.0000 mg | ORAL_TABLET | Freq: Every day | ORAL | Status: DC
  Administered 2012-01-27: 500 mg via ORAL
  Filled 2012-01-26 (×2): qty 1

## 2012-01-26 MED ORDER — TAMSULOSIN HCL 0.4 MG PO CAPS
0.4000 mg | ORAL_CAPSULE | Freq: Every day | ORAL | Status: DC
  Administered 2012-01-26 – 2012-01-27 (×2): 0.4 mg via ORAL
  Filled 2012-01-26 (×2): qty 1

## 2012-01-26 MED ORDER — IPRATROPIUM-ALBUTEROL 18-103 MCG/ACT IN AERO
2.0000 | INHALATION_SPRAY | RESPIRATORY_TRACT | Status: DC | PRN
  Filled 2012-01-26: qty 14.7

## 2012-01-26 MED ORDER — BUDESONIDE-FORMOTEROL FUMARATE 160-4.5 MCG/ACT IN AERO
2.0000 | INHALATION_SPRAY | Freq: Two times a day (BID) | RESPIRATORY_TRACT | Status: DC
  Administered 2012-01-26 – 2012-01-27 (×2): 2 via RESPIRATORY_TRACT
  Filled 2012-01-26: qty 6

## 2012-01-26 MED ORDER — CARVEDILOL 12.5 MG PO TABS
12.5000 mg | ORAL_TABLET | Freq: Two times a day (BID) | ORAL | Status: DC
  Administered 2012-01-26 – 2012-01-27 (×2): 12.5 mg via ORAL
  Filled 2012-01-26 (×4): qty 1

## 2012-01-26 MED ORDER — SODIUM CHLORIDE 0.9 % IJ SOLN
10.0000 mL | INTRAMUSCULAR | Status: DC | PRN
  Administered 2012-01-26: 10 mL

## 2012-01-26 NOTE — Progress Notes (Signed)
Addendum:  Pt actually has made good progress with therapy and is planning for DC home instead of to CIR.  Recommend NS moist packing to abd wound, cover with dry, change daily.  Follow up with [name] in 1-2 weeks.  Marianna Fuss PA-C 01/26/2012 10:27 AM

## 2012-01-26 NOTE — Progress Notes (Signed)
Name: [name] MRN: [id number] DOB: 27-Jul-1941    LOS: 15  PCCM PROGRESS  NOTE  History of Present Illness:  71 yo male former smoker transferred admitted to [facility] on 1/18 with diverticulitis and free air.  Started on Abx, and [facility].  Found to have pneumoperitoneum and developing abscess.  Taken to OR 1/21 for exploratory laparotomy, drainage of intraabdominal abscess, sigmoid colectomy and colostomy. Hx of COPD, CAD, s/p MVR and CABG, DM, A fib, Systolic CHF, Prostate cancer s/p cryosurgery (does intermittent self cath at home)  Lines / Drains: 1/21  ETT>>1/23 1/21  Foley>> 01/23/12, 01/22/11 intermittent self cath daily for his chronic urethral structure per DR Achilles Dunk urologist >> 1/21  R IJ TLC>> 01/23/12 1/21  JP drain>>  Cultures: None  Antibiotics: 1/18  Cipro>>> 1/21 1/18  Flagyl (abd abscess)>>1/21 1/21  Ertapenem (abd abscess)>>>1/31 01/23/12 Nitrafurantoin (for daily intermittent cath foley, home med, pulmonary risks explained ) >>  Tests / Events: 1/12 Echo>>EF 40 to 45%, inferior wall and apical hypokinesis, prosthetic mitral valve  1/20  Abdominal CT>>>pneumoperitoneum / intraabdominal abscess 1/21  Exploratory laparotomy, drainage of intraabdominal abscess, sigmoid colectomy and colostomy 2/1 ready to go to rehab, foley out, central line out 2/1 Non-sustained VT>>transfer to rehab cancelled 2/4 PMR recommend home with home health therapy  SUBJECTIVE: Denies chest pain, dyspnea, abdominal pain.  Walking in hall w/o difficulty.  Vital Signs: Temp:  [97.4 F (36.3 C)-98.4 F (36.9 C)] 97.4 F (36.3 C) (02/04 0635) Pulse Rate:  [92-110] 110  (02/04 1006) Resp:  [18-20] 20  (02/04 0635) BP: (111-139)/(50-79) 139/79 mmHg (02/04 0635) SpO2:  [95 %-98 %] 98 % (02/04 0820) Weight:  [157 lb 3 oz (71.3 kg)] 157 lb 3 oz (71.3 kg) (02/04 0616) I/O last 3 completed shifts: In: 780 [P.O.:780] Out: 1000 [Urine:1000]  Physical Examination:  General - no  distress HEENT - no sinus tenderness Cardiac - s1s2 with 2/6 SM Chest - no wheeze, rales Abd - soft, nontender, colostomy in place Ext - no edema Neuro - normal strength  Labs and Imaging:   Chest Portable 1 View Post Insertion To Confirm Placement As Interpreted By Radiologist  01/26/2012  *RADIOLOGY REPORT*  Clinical Data: PICC line placement.  PORTABLE CHEST - 1 VIEW  Comparison: Chest x-ray 01/23/2012.  Findings: The heart is enlarged but stable.  Mild vascular congestion and areas of atelectasis are noted.  The right IJ catheter is stable.  The new left-sided PICC line is in good position.  The tip is near the cavoatrial junction.  IMPRESSION: Left PICC line tip in good position near the cavoatrial junction.  Original Report Authenticated By: P. Loralie Champagne, M.D.    Lab 01/24/12 0600 01/23/12 0535 01/22/12 0500  NA 144 142 139  K 4.0 4.1 3.9  CL 109 107 106  CO2 28 27 25   BUN 13 15 17   CREATININE 0.79 0.89 0.84  GLUCOSE 113* 122* 115*    Lab 01/26/12 0500 01/25/12 0500 01/24/12 0600  HGB 8.8* 8.8* 8.9*  HCT 27.2* 26.9* 27.6*  WBC 3.8* 5.3 6.3  PLT 267 275 261     Assessment and Plan:  Intra-abdominal abscess 2nd to perf diverticulum -s/p sigmoid colectomy/colostomy -completed antibiotics -advancing diet per CCS -wound care following for ostomy  Hx of COPD -change to home regimen of symbicort and combivent -followed by primary care as outpt  Post-operative respiratory failure -resolved  Hx of CAD s/p CABG, s/p MVR, chronic systolic CHF, A  fib, dyslipidemia -had episode of non-sustained VT 2/1 -cardiology adjusting does of coreg -continue digoxin, lasix, coumadin per cardiology -will need outpt f/u with Cardiology (Dr. Mariah Milling) -defer if/when to resume ASA, zocor to cardiology  Herpes labialis -improving with zovirax ointment  GERD, SUP -continue protonix  Diabetes / hyperglycemia -SSI -transition to oral agents now that he is tolerating diet>>resume  metformin 500 mg daily  Protein-calorie malnutrition -diet per CCS  Obstructive Uropathy On 01/22/12 Dr Marchelle Gearing  discussed with his urologist Dr Achilles Dunk at [facility]. 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 Requires intermittent self cath otheriwse patient will obstruct. If on intermittent self cath, he strongly feels patient needs low dose nitrafurantoin. This is despite risk for pulmonary toxicity which is low but risk of recurrent UTI which is high without nitrafurantoin.  -continue nitrofurantoin, flomax 0.4 mg daily, intermittent self cath  Disposition -if CBG's stable with transition to metformin, and heart rate controlled with change to coreg then possible d/c home 2/5  Coralyn Helling, MD 01/26/2012 11:20 AM Pager:  [phone]

## 2012-01-26 NOTE — Consult Note (Signed)
Ostomy follow-up:  Wife at bedside to assist with pouch change.  She is able to cut and apply one piece appliance and close with velcro.  Stoma 80% red, 20% slough, beginning to have mucocutaneous separation to stoma edges.  Brayton El, PA at bedside to assess site abd wound and stoma; and pt will be followed as outpatient after discharge.  Reviewed pouching routines and ordering supplies.  Cammie Mcgee, RN, MSN, [employer]  [phone]

## 2012-01-26 NOTE — Progress Notes (Signed)
   CARE MANAGEMENT NOTE 01/26/2012  Patient:  [name], [name]   Account Number:  [id number]  Date Initiated:  01/12/2012  Documentation initiated by:  Junius Creamer  Subjective/Objective Assessment:   adm w perforated diverticulum  01-11-11 post op on vent.     Action/Plan:   lives w wife, pcp dr Wonda Cheng   Anticipated DC Date:  01/22/2012   Anticipated DC Plan:  IP REHAB FACILITY      DC Planning Services  CM consult      [facility] Choice  HOME HEALTH   Choice offered to / List presented to:  C-1 Patient        HH arranged  HH-1 RN  HH-2 PT  HH-3 OT      HH agency  OTHER - SEE NOTE   Status of service:  In process, will continue to follow Medicare Important Message given?   (If response is "NO", the following Medicare IM given date fields will be blank) Date Medicare IM given:   Date Additional Medicare IM given:    Discharge Disposition:  HOME W HOME HEALTH SERVICES  Per UR Regulation:  Reviewed for med. necessity/level of care/duration of stay  Comments:  01/26/12 Coleta Grosshans,RN,BSN 1658 PT NOW AMBULATING WITHOUT DIFFICULTY; INPT REHAB HAS DECLINED ADMISSION.  PT IS SAFE TO GO WITH HOME HEALTH CARE.  WILL ARRANGE HOME HEALTH RN, PT AND OT.  PT/WIFE CHOOSE [facility] FOR HOME HEALTH NEEDS.  START OF CARE 01/28/12.  FAXED REFERRAL TO [id number].  PT HAS RW, TUB SEAT.  DENIES NEED FOR 3 IN 1 Phone #[phone]   01/23/12 [name],RN,BSN PT READY FOR REHAB TODAY, BUT HAD NON-SUSTAINED VTACH. WILL REEVALUATE ON MONDAY FOR CIR. Phone #[phone]   01/20/12 [name],RN,BSN 1430 PT RECOMMENDING IP REHAB.  WILL OBTAIN ORDERS FOR OT CONSULT AND REHAB CONSULT.  MET WITH PT AND WIFE TO DISCUSS DC PLANS.  PTA, PT INDEPENDENT, LIVES WITH WIFE.  WIFE AVAILABLE AT DISCHARGE TO PROVIDE 24HR CARE.  PT/WIFE VERY INTERESTED IN CIR.  WILL FOLLOW FOR CONSULT. Phone #[phone]   01-19-12 2:30pm [name], RNBSN - [phone] UR Completed.  01-15-12 1:50pm [name], RNBSN [phone] UR Completed. Now extubated as of 01-14-12  01-13-12 2:30pm [name], RNBSN - [phone] UR completed.  1/21 Billy dowell rn,bsn [phone]

## 2012-01-26 NOTE — Progress Notes (Signed)
Agree Launi Asencio, MD, MPH, FACS Pager: [phone]  

## 2012-01-26 NOTE — Progress Notes (Signed)
Agree Rheana Casebolt, MD, MPH, FACS Pager: [phone]  

## 2012-01-26 NOTE — Progress Notes (Signed)
14 Days Post-Op  Subjective: Pt ok. Just got PICC placed, (R)IJ to come out. Eating fairly well, though appetite could be better.   Objective: Vital signs in last 24 hours: Temp:  [97.4 F (36.3 C)-98.4 F (36.9 C)] 97.4 F (36.3 C) (02/04 0635) Pulse Rate:  [92-99] 92  (02/04 0635) Resp:  [18-20] 20  (02/04 0635) BP: (111-139)/(50-79) 139/79 mmHg (02/04 0635) SpO2:  [95 %-98 %] 98 % (02/04 0820) Weight:  [71.3 kg (157 lb 3 oz)] 71.3 kg (157 lb 3 oz) (02/04 0616) Last BM Date: 01/26/12  Intake/Output this shift:    Physical Exam: BP 139/79  Pulse 92  Temp(Src) 97.4 F (36.3 C) (Oral)  Resp 20  Ht 5' 4.8" (1.646 m)  Wt 71.3 kg (157 lb 3 oz)  BMI 26.32 kg/m2  SpO2 98% Abdomen: soft, ND, NT Ostomy pink, viable good output. Wound clean, area near umbilicus is deep and packing not getting down far enough, some fluid accumulating, looks serous to me, no pus, odor. Repacked wound and tucked gauze in deeper area. No peri-wound erythema.  Labs: CBC  Basename 01/26/12 0500 01/25/12 0500  WBC 3.8* 5.3  HGB 8.8* 8.8*  HCT 27.2* 26.9*  PLT 267 275   BMET  Basename 01/24/12 0600  NA 144  K 4.0  CL 109  CO2 28  GLUCOSE 113*  BUN 13  CREATININE 0.79  CALCIUM 8.5   LFT No results found for this basename: PROT,ALBUMIN,AST,ALT,ALKPHOS,BILITOT,BILIDIR,IBILI,LIPASE in the last 72 hours PT/INR  Basename 01/26/12 0500 01/25/12 0500  LABPROT 30.6* 33.8*  INR 2.88* 3.27*   ABG No results found for this basename: PHART:2,PCO2:2,PO2:2,HCO3:2 in the last 72 hours  Studies/Results: No results found.  Assessment: Principal Problem:  *Diverticulitis of large intestine with perforation s/p sigmoid colectomy and colostomy 01/12/12 Active Problems:  Atrial fibrillation  Chronic systolic heart failure  COPD (chronic obstructive pulmonary disease)  Severe sepsis without septic shock  Wound, open, abdominal wall, anterior-healing by secondary intention  Protein-calorie  malnutrition, severe   Procedure(s): COLON RESECTION  Plan: Ok from surgery standpoint to go to CIR Recommend NS wet-dry dressing with packing to deeper area as mentioned. We can follow in IR periodically.  LOS: 15 days    Alyse Low 01/26/2012 9:25 AM

## 2012-01-26 NOTE — Progress Notes (Addendum)
Subjective:  No chest pain or SOB.  Complains of low abdominal pain.  Objective:  Vital Signs in the last 24 hours: BP 139/79  Pulse 92  Temp(Src) 97.4 F (36.3 C) (Oral)  Resp 20  Ht 5' 4.8" (1.646 m)  Wt 157 lb 3 oz (71.3 kg)  BMI 26.32 kg/m2  SpO2 95%  Physical Exam: Pleasant WM NAD Lungs:  Clear to A&P Cardiac:  irregular rhythm, normal S1 and S2, no S3, no murmur abd - s/p surgery and colostomy Extremities:  No edema present  Intake/Output from previous day: 02/03 0701 - 02/04 0700 In: 720 [P.O.:720] Out: 750 [Urine:750]  Lab Results: Basic Metabolic Panel:  Basename 01/24/12 0600  NA 144  K 4.0  CL 109  CO2 28  GLUCOSE 113*  BUN 13  CREATININE 0.79   CBC:  Basename 01/26/12 0500 01/25/12 0500  WBC 3.8* 5.3  NEUTROABS -- --  HGB 8.8* 8.8*  HCT 27.2* 26.9*  MCV 91.0 90.0  PLT 267 275    PROTIME: Lab Results  Component Value Date   INR 2.88* 01/26/2012   INR 3.27* 01/25/2012   INR 3.54* 01/24/2012    Telemetry: Atrial fibrillation. Rate increased at times Assessment/Plan:  1. S/p MVR and CABG stable; continue coumadin 2. Chronic a fib rate mild increased; change coreg to 12.5 mg po BID 3. Chronic systolic CHF stable 4.  S/P abdominal surgery - management per surgery  REC:  If BP stable after coreg this AM, patient can go to rehab from cardiac viewpoint.     Olga Millers

## 2012-01-26 NOTE — Progress Notes (Signed)
Rehab admissions - I spoke with patient and family this am.  Patient has made great progress over weekend.  He is ambulating out on the unit with no assistive device.  At this point, he is doing too well for inpatient rehab.  I recommend home with [facility] therapies when MD feels he is medically ready.  Pager [phone]

## 2012-01-26 NOTE — Progress Notes (Signed)
ANTICOAGULATION CONSULT NOTE - Follow Up Consult  Pharmacy Consult for Coumadin Indication: atrial fibrillation and MVR  Allergies  Allergen Reactions  . Codeine     REACTION: hives  . Sulfonamide Derivatives     REACTION: hives  . Tylenol-Codeine     REACTION: hives    Patient Measurements: Height: 5' 4.8" (164.6 cm) Weight: 157 lb 3 oz (71.3 kg) (Scale A) IBW/kg (Calculated) : 61.04   Vital Signs: Temp: 98.8 F (37.1 C) (02/04 1327) Temp src: Oral (02/04 1327) BP: 121/56 mmHg (02/04 1327) Pulse Rate: 96  (02/04 1327)  Labs:  Basename 01/26/12 0500 01/25/12 0500 01/24/12 0615 01/24/12 0600  HGB 8.8* 8.8* -- --  HCT 27.2* 26.9* -- 27.6*  PLT 267 275 -- 261  APTT -- -- -- 60*  LABPROT 30.6* 33.8* -- 36.0*  INR 2.88* 3.27* -- 3.54*  HEPARINUNFRC -- -- -- --  CREATININE -- -- -- 0.79  CKTOTAL -- -- 69 --  CKMB -- -- 2.7 --  TROPONINI -- -- <0.30 --   Estimated Creatinine Clearance: 72 ml/min (by C-G formula based on Cr of 0.79).   Medications:  Scheduled:    . acyclovir ointment   Topical Q3H  . antiseptic oral rinse  1 application Mouth Rinse QID  . budesonide-formoterol  2 puff Inhalation BID  . carvedilol  12.5 mg Oral BID WC  . chlorhexidine  15 mL Mouth/Throat BID  . digoxin  0.125 mg Oral Daily  . feeding supplement  237 mL Oral BID BM  . furosemide  20 mg Oral Daily  . insulin aspart  0-20 Units Subcutaneous TID AC & HS  . insulin glargine  15 Units Subcutaneous Daily  . latanoprost  1 drop Both Eyes QHS  . metFORMIN  500 mg Oral Q breakfast  . nitrofurantoin  100 mg Oral Daily  . pantoprazole  40 mg Oral Q1200  . Tamsulosin HCl  0.4 mg Oral Daily  . warfarin  2.5 mg Oral ONCE-1800  . DISCONTD: budesonide  0.25 mg Nebulization BID  . DISCONTD: carvedilol  6.25 mg Oral BID WC  . DISCONTD: ipratropium  0.5 mg Nebulization Q6H  . DISCONTD: levalbuterol  0.63 mg Nebulization Q6H    Assessment: 71 y/o male patient on chronic coumadin for h/o  afib/MVR, INR trend down, no bleeding reported. At home takes 2.5mg  daily except 5mg  wed/sat. Will increase dose today. No drug intxns identified.  Goal of Therapy:  INR=2.5-3.5   Plan:  Coumadin 5mg  today and f/u in am.  Verlene Mayer, PharmD, BCPS Pager [phone] 01/26/2012,2:19 PM

## 2012-01-27 DIAGNOSIS — N134 Hydroureter: Secondary | ICD-10-CM

## 2012-01-27 DIAGNOSIS — K651 Peritoneal abscess: Secondary | ICD-10-CM

## 2012-01-27 DIAGNOSIS — I4891 Unspecified atrial fibrillation: Secondary | ICD-10-CM | POA: Diagnosis not present

## 2012-01-27 DIAGNOSIS — J449 Chronic obstructive pulmonary disease, unspecified: Secondary | ICD-10-CM | POA: Diagnosis not present

## 2012-01-27 LAB — CBC
HCT: 27 % — ABNORMAL LOW (ref 39.0–52.0)
Hemoglobin: 8.7 g/dL — ABNORMAL LOW (ref 13.0–17.0)
MCHC: 32.2 g/dL (ref 30.0–36.0)
MCV: 90.9 fL (ref 78.0–100.0)
RDW: 15.5 % (ref 11.5–15.5)

## 2012-01-27 LAB — BASIC METABOLIC PANEL
CO2: 27 mEq/L (ref 19–32)
Calcium: 8.5 mg/dL (ref 8.4–10.5)
Chloride: 107 mEq/L (ref 96–112)
Creatinine, Ser: 0.79 mg/dL (ref 0.50–1.35)
Glucose, Bld: 115 mg/dL — ABNORMAL HIGH (ref 70–99)
Sodium: 141 mEq/L (ref 135–145)

## 2012-01-27 LAB — GLUCOSE, CAPILLARY

## 2012-01-27 MED ORDER — LATANOPROST 0.005 % OP SOLN: 1.0000 [drp] | Freq: Every day | OPHTHALMIC | Status: AC

## 2012-01-27 MED ORDER — ACYCLOVIR 5 % EX OINT: TOPICAL_OINTMENT | CUTANEOUS | Status: DC

## 2012-01-27 MED ORDER — ASPIRIN 81 MG PO CHEW
81.0000 mg | CHEWABLE_TABLET | Freq: Every day | ORAL | Status: DC
  Filled 2012-01-27: qty 1

## 2012-01-27 MED ORDER — SIMVASTATIN 40 MG PO TABS
40.0000 mg | ORAL_TABLET | Freq: Every day | ORAL | Status: DC
  Filled 2012-01-27: qty 1

## 2012-01-27 MED ORDER — METFORMIN HCL 500 MG PO TABS
500.0000 mg | ORAL_TABLET | Freq: Two times a day (BID) | ORAL | Status: DC
  Filled 2012-01-27 (×2): qty 1

## 2012-01-27 MED ORDER — TRAMADOL HCL 50 MG PO TABS: 50.0000 mg | ORAL_TABLET | Freq: Four times a day (QID) | ORAL | Status: AC | PRN

## 2012-01-27 NOTE — Progress Notes (Signed)
Physical Therapy Treatment Patient Details Name: [name] MRN: [id number] DOB: Dec 09, 1941 Today's Date: 01/27/2012  PT Assessment/Plan  PT - Assessment/Plan Comments on Treatment Session: pt presents s/p CABG, MVR, Colostomy and Colectomy.  pt much improved today and eager for D/C to home later today.   PT Plan: Discharge plan remains appropriate PT Frequency: Min 3X/week Follow Up Recommendations: Home health PT Equipment Recommended: None recommended by PT PT Goals  Acute Rehab PT Goals PT Goal: Rolling Supine to Right Side - Progress: Met PT Goal: Rolling Supine to Left Side - Progress: Met PT Goal: Supine/Side to Sit - Progress: Met PT Goal: Sit at Edge Of Bed - Progress: Met PT Goal: Sit to Supine/Side - Progress: Progressing toward goal PT Goal: Sit to Stand - Progress: Met PT Goal: Stand to Sit - Progress: Met PT Transfer Goal: Bed to Chair/Chair to Bed - Progress: Met PT Goal: Ambulate - Progress: Progressing toward goal PT Goal: Up/Down Stairs - Progress: Progressing toward goal  PT Treatment Precautions/Restrictions  Precautions Precautions: Fall Precaution Comments: caution with bed mobility and transitional movements due to abdominal incision Required Braces or Orthoses: No Restrictions Weight Bearing Restrictions: No Mobility (including Balance) Bed Mobility Bed Mobility: Yes Rolling Right: 6: Modified independent (Device/Increase time) Right Sidelying to Sit: 6: Modified independent (Device/Increase time) Sitting - Scoot to Edge of Bed: 7: Independent Transfers Transfers: Yes Sit to Stand: 6: Modified independent (Device/Increase time);With upper extremity assist;From bed Stand to Sit: 6: Modified independent (Device/Increase time);With upper extremity assist;To chair/3-in-1 Ambulation/Gait Ambulation/Gait: Yes Ambulation/Gait Assistance: 5: Supervision Ambulation/Gait Assistance Details (indicate cue type and reason): pt much improved and able to turn  head without LOB today.   Ambulation Distance (Feet): 300 Feet Assistive device: None Gait Pattern: Antalgic;Decreased trunk rotation Stairs: No Wheelchair Mobility Wheelchair Mobility: No  Posture/Postural Control Posture/Postural Control: Postural limitations Postural Limitations: guarded secondary to abdominal incision/ostomy Exercise    End of Session PT - End of Session Equipment Utilized During Treatment:  (None) Activity Tolerance: Patient tolerated treatment well Patient left: in chair;with call bell in reach;with family/visitor present Nurse Communication: Mobility status for transfers;Mobility status for ambulation General Behavior During Session: [facility] for tasks performed Cognition: [facility] for tasks performed  Sunny Schlein, [place] [phone] 01/27/2012, 11:26 AM

## 2012-01-27 NOTE — Progress Notes (Signed)
Occupational Therapy Treatment Patient Details Name: [name] MRN: [id number] DOB: 06/28/1941 Today's Date: 01/27/2012  OT Assessment/Plan OT Assessment/Plan Comments on Treatment Session: Pt. progressing very well and anticipates D/C home today. OT Plan: Discharge plan remains appropriate OT Frequency: Min 2X/week Follow Up Recommendations: Home health OT Equipment Recommended: None recommended by OT OT Goals Acute Rehab OT Goals OT Goal Formulation: With patient Time For Goal Achievement: 7 days ADL Goals Pt Will Perform Grooming: Independently;Standing at sink ADL Goal: Grooming - Progress: Progressing toward goals Pt Will Perform Upper Body Bathing: with modified independence;Sitting in shower ADL Goal: Upper Body Bathing - Progress: Not met Pt Will Perform Lower Body Bathing: with supervision;Sit to stand in shower ADL Goal: Lower Body Bathing - Progress: Progressing toward goals Pt Will Perform Upper Body Dressing: with set-up;Sitting, bed ADL Goal: Upper Body Dressing - Progress: Met Pt Will Perform Lower Body Dressing: with min assist;Sit to stand from chair ADL Goal: Lower Body Dressing - Progress: Met Pt Will Transfer to Toilet: with modified independence;Ambulation;with DME ADL Goal: Toilet Transfer - Progress: Progressing toward goals Pt Will Perform Tub/Shower Transfer: Shower transfer;with supervision;Ambulation;with DME ADL Goal: Tub/Shower Transfer - Progress: Not met  OT Treatment Precautions/Restrictions  Precautions Precautions: Fall Precaution Comments: caution with bed mobility and transitional movements due to abdominal incision Required Braces or Orthoses: No Restrictions Weight Bearing Restrictions: No   ADL ADL Grooming: Performed;Wash/dry face;Wash/dry hands;Set up;Supervision/safety Where Assessed - Grooming: Standing at sink Upper Body Dressing: Performed;Set up;Supervision/safety Upper Body Dressing Details (indicate cue type and reason):  donning gown in standing Where Assessed - Upper Body Dressing: Standing Lower Body Dressing: Performed;Modified independent Where Assessed - Lower Body Dressing: Sitting, chair Toilet Transfer: Simulated;Supervision/safety Toilet Transfer Method: Proofreader: Other (comment) (chair) Ambulation Related to ADLs: Pt. provided with min guard assist for ambulation ~150'  ADL Comments: Pt. educated on techniques for completing LB ADLs due to abdominal incision and decreased activity tolerance. Pt.'s family educated on DME and safety techniques at home to facilitate safe D/C. Mobility  Bed Mobility Bed Mobility: Yes Rolling Right: 6: Modified independent (Device/Increase time) Right Sidelying to Sit: 6: Modified independent (Device/Increase time) Sitting - Scoot to Edge of Bed: 7: Independent Transfers Transfers: Yes Sit to Stand: 6: Modified independent (Device/Increase time);With upper extremity assist;From chair/3-in-1 Stand to Sit: 6: Modified independent (Device/Increase time);With upper extremity assist;To chair/3-in-1     End of Session OT - End of Session Equipment Utilized During Treatment: Gait belt Activity Tolerance: Patient tolerated treatment well Patient left: in chair;with family/visitor present;Other (comment) Nurse Communication: Mobility status for transfers;Mobility status for ambulation General Behavior During Session: [facility] for tasks performed Cognition: [facility] for tasks performed  Cassandria Anger, OTR/L Pager [phone]  01/27/2012, 9:54 AM

## 2012-01-27 NOTE — Progress Notes (Signed)
Subjective:  No chest pain or SOB.  Has some abdominal pain after redressing last PM  Objective:  Vital Signs in the last 24 hours: BP 129/77  Pulse 94  Temp(Src) 97.8 F (36.6 C) (Oral)  Resp 20  Ht 5' 4.8" (1.646 m)  Wt 155 lb 6.8 oz (70.5 kg)  BMI 26.02 kg/m2  SpO2 95%  Physical Exam: Pleasant WM NAD Lungs:  Clear to A&P Cardiac:  irregular rhythm, crisp mechanical valve abd - s/p surgery and colostomy Extremities:  No edema present  Intake/Output from previous day: 02/04 0701 - 02/05 0700 In: 2980 [P.O.:2980] Out: 0   Lab Results: Basic Metabolic Panel: No results found for this basename: NA:2,K:2,CL:2,CO2:2,GLUCOSE:2,BUN:2,CREATININE:2 in the last 72 hours CBC:  Basename 01/26/12 0500 01/25/12 0500  WBC 3.8* 5.3  NEUTROABS -- --  HGB 8.8* 8.8*  HCT 27.2* 26.9*  MCV 91.0 90.0  PLT 267 275    PROTIME: Lab Results  Component Value Date   INR 2.88* 01/26/2012   INR 3.27* 01/25/2012   INR 3.54* 01/24/2012    Telemetry: Atrial fibrillation. Rate improved Assessment/Plan:  1. S/p MVR and CABG stable; continue coumadin 2. Chronic a fib rate improved; continue dig, coreg and coumadin 3. Chronic systolic CHF stable; continue present dose of lasix. 4.  S/P abdominal surgery - management per surgery  REC:  Patient can be dced from cardiac standpoint; would resume all preadmission meds at dc except cozaar; patient should have INR checked on Friday 01/30/12 in [place] office. Fu with Dr Mariah Milling for cardiac issues in 4-6 weeks; will consider resuming cozaar at that time pending BP. [name]

## 2012-01-27 NOTE — Discharge Summary (Signed)
Physician Discharge Summary  Patient ID: [name] MRN: [id number] DOB/AGE: 73-Dec-1942 73 y.o.  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 male who transferred from [facility] For second opinion in regard to surgical treatment of diagnosed perforated diverticulum. He was hospitalized 6 days ago at [facility] for a COPD exacerbation and pneumonia, and Atrial fibrillation with RVR. He had no bowel movements for the past 5 days, and began to have severe lower abdominal pain and distension 2 days ago and on Chest X-ray had been found to have free air under the diaphragm, so a Ct scan of the ABD was performed and he was found to have a ruptured diverticulum. His family is at the bedside and his daughter an ER Nurse at [facility] gives the entire medical history. She states that a surgery consultation was performed at [facility] and the surgeon felt that he was not a good candidate for surgery, and suggested medical management and was placed on IV cipro and flagyl. She states that the patient had decreased bowel sounds and worsening ABD distension, and has had hematemesis, as well as hematuria. He is on coumadin therapy Since 2007 for a Valve Replacement. He has been on Lovenox injections and his coumadin has been on hold.    Hospital Course:  Assessment and Plan:  Intra-abdominal abscess 2nd to perf diverticulum  -s/p sigmoid colectomy/colostomy  -completed antibiotics  -tolerating diet  -will need outpt  follow up with [name] in 1 week  Hx of COPD  -continue home regimen of symbicort and combivent  -followed by primary care as outpt  Post-operative respiratory failure  -resolved  Hx of CAD s/p CABG, s/p MVR, chronic systolic CHF, A fib, dyslipidemia  -had episode of non-sustained VT 2/1  -okay for d/c home per cardiology  -continue coreg, digoxin, lasix, coumadin per cardiology  -f/u INR on 2/8 in [facility] cardiology office  -resume outpt doses of aspirin, zocor  -hold cozaar  -will need outpt f/u with Cardiology in 4 to 6 weeks (Dr. Mariah Milling)  Herpes labialis  -improving with zovirax ointment  GERD, SUP  -continue protonix  Diabetes / hyperglycemia  -resume outpt regimen of metformin 500 mg bid, and d/c insulin  -has diabetic monitoring at home  -f/u with PCP  Protein-calorie malnutrition  -diet per CCS  Obstructive Uropathy  On 01/22/12 Dr Marchelle Gearing discussed with his urologist Dr Achilles Dunk at [place]. 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 Requires intermittent self cath otheriwse patient will obstruct. If on intermittent self cath, he strongly feels patient needs low dose nitrafurantoin. This is despite risk for pulmonary toxicity which is low but risk of recurrent UTI which is high without nitrafurantoin.  -continue nitrofurantoin, flomax 0.4 mg daily, intermittent self cath  Anemia likely from critical illness  -f/u as outpt  Disposition  -d/c home 2/5 after PICC line removed -Follow up with Dr. Suzie Portela at [facility].    Labs at discharge Lab Results  Component Value Date   CREATININE 0.79 01/27/2012   BUN 11 01/27/2012   NA 141 01/27/2012   K 3.5 01/27/2012  CL 107 01/27/2012   CO2 27 01/27/2012   Lab Results  Component Value Date   WBC 3.3* 01/27/2012   HGB 8.7* 01/27/2012   HCT 27.0* 01/27/2012   MCV 90.9 01/27/2012   PLT 263 01/27/2012   Lab Results  Component Value Date   ALT 10 01/19/2012   AST 19 01/19/2012   ALKPHOS 32*  01/19/2012   BILITOT 0.3 01/19/2012   Lab Results  Component Value Date   INR 4.09* 01/27/2012   INR 2.88* 01/26/2012   INR 3.27* 01/25/2012    Current radiology studies Chest Portable 1 View Post Insertion To Confirm Placement As Interpreted By Radiologist  01/26/2012  *RADIOLOGY REPORT*  Clinical Data: PICC line placement.  PORTABLE CHEST - 1 VIEW  Comparison: Chest x-ray 01/23/2012.  Findings: The heart is enlarged but stable.  Mild vascular congestion and areas of atelectasis are noted.  The right IJ catheter is stable.  The new left-sided PICC line is in good position.  The tip is near the cavoatrial junction.  IMPRESSION: Left PICC line tip in good position near the cavoatrial junction.  Original Report Authenticated By: P. Loralie Champagne, M.D.    Disposition:  Home or Self Care   Medication List  As of 01/27/2012 11:11 AM   ASK your doctor about these medications         aspirin EC 81 MG tablet   Take 81 mg by mouth daily.      budesonide-formoterol 160-4.5 MCG/ACT inhaler   Commonly known as: SYMBICORT   Inhale 2 puffs into the lungs 2 (two) times daily.      carvedilol 12.5 MG tablet   Commonly known as: COREG   Take 12.5 mg by mouth 2 (two) times daily with a meal.      digoxin 0.125 MG tablet   Commonly known as: LANOXIN   Take 125 mcg by mouth daily.      furosemide 20 MG tablet   Commonly known as: LASIX   Take 20 mg by mouth daily.      guaiFENesin 600 MG 12 hr tablet   Commonly known as: MUCINEX   Take 1,200 mg by mouth at bedtime.      indomethacin 50 MG capsule   Commonly known as: INDOCIN   Take 50 mg by mouth 3 (three) times daily as needed.      ipratropium-albuterol 0.5-2.5 (3) MG/3ML Soln   Commonly known as: DUONEB   Take 3 mLs by nebulization as needed. For shortness of breath      albuterol-ipratropium 18-103 MCG/ACT inhaler   Commonly known as: COMBIVENT   Inhale 2 puffs into the lungs as needed. For shortness of breath      losartan 100 MG  tablet   Commonly known as: COZAAR   Take 100 mg by mouth daily.      metFORMIN 500 MG tablet   Commonly known as: GLUCOPHAGE   Take 500 mg by mouth 2 (two) times daily with a meal.      nitrofurantoin 100 MG capsule   Commonly known as: MACRODANTIN   Take 100 mg by mouth daily.      OVER THE COUNTER MEDICATION   Take 1 tablet by mouth daily. Preservision Eye Vitamins      simvastatin 40 MG tablet   Commonly known as: ZOCOR   Take 40 mg by mouth at bedtime.      Tamsulosin HCl 0.4 MG Caps   Commonly known as: FLOMAX   Take  0.4 mg by mouth daily.      Vitamin D3 1000 UNITS Caps   Take 1 capsule by mouth daily.      warfarin 2.5 MG tablet   Commonly known as: COUMADIN   Take 2.5 mg by mouth daily. Take 2.5 mg on Mon, Tues, Thurs,Fri, and Sun. Take 5 mg on Wed and Saturday.           Follow-up Information    Follow up with ROSENBOWER,TODD J, MD. Schedule an appointment as soon as possible for a visit in 2 weeks. (Call as needed)    Contact information:   [facility] [address] [place] [address] [phone]          Discharged Condition: good  Signed: Brett Canales Minor ACNP Adolph Pollack PCCM Pager [phone] till 3 pm If no answer page [phone] 01/27/2012, 11:11 AM  Discharge time 35 minutes.  Coralyn Helling, MD 01/27/2012, 11:53 AM Pager:  [phone]

## 2012-01-27 NOTE — Progress Notes (Signed)
ANTICOAGULATION CONSULT NOTE - Follow Up Consult  Pharmacy Consult for Coumadin  Indication: atrial fibrillation and MVR  Allergies  Allergen Reactions  . Codeine     REACTION: hives  . Sulfonamide Derivatives     REACTION: hives  . Tylenol-Codeine     REACTION: hives    Patient Measurements: Height: 5' 4.8" (164.6 cm) Weight: 155 lb 6.8 oz (70.5 kg) (bed) IBW/kg (Calculated) : 61.04   Vital Signs: Temp: 97.8 F (36.6 C) (02/05 0441) BP: 127/69 mmHg (02/05 0654) Pulse Rate: 97  (02/05 0654)  Labs:  Basename 01/27/12 0500 01/26/12 0500 01/25/12 0500  HGB 8.7* 8.8* --  HCT 27.0* 27.2* 26.9*  PLT 263 267 275  APTT -- -- --  LABPROT 40.3* 30.6* 33.8*  INR 4.09* 2.88* 3.27*  HEPARINUNFRC -- -- --  CREATININE 0.79 -- --  CKTOTAL -- -- --  CKMB -- -- --  TROPONINI -- -- --   Estimated Creatinine Clearance: 72 ml/min (by C-G formula based on Cr of 0.79).   Medications:  Scheduled:    . acyclovir ointment   Topical Q3H  . antiseptic oral rinse  1 application Mouth Rinse QID  . aspirin  81 mg Oral Daily  . budesonide-formoterol  2 puff Inhalation BID  . carvedilol  12.5 mg Oral BID WC  . digoxin  0.125 mg Oral Daily  . feeding supplement  237 mL Oral BID BM  . furosemide  20 mg Oral Daily  . latanoprost  1 drop Both Eyes QHS  . metFORMIN  500 mg Oral BID WC  . nitrofurantoin  100 mg Oral Daily  . pantoprazole  40 mg Oral Q1200  . simvastatin  40 mg Oral q1800  . Tamsulosin HCl  0.4 mg Oral Daily  . warfarin  5 mg Oral ONCE-1800  . DISCONTD: budesonide  0.25 mg Nebulization BID  . DISCONTD: chlorhexidine  15 mL Mouth/Throat BID  . DISCONTD: insulin aspart  0-20 Units Subcutaneous TID AC & HS  . DISCONTD: insulin glargine  15 Units Subcutaneous Daily  . DISCONTD: ipratropium  0.5 mg Nebulization Q6H  . DISCONTD: levalbuterol  0.63 mg Nebulization Q6H  . DISCONTD: metFORMIN  500 mg Oral Q breakfast    Assessment: 71 y/o male patient on chronic coumadin for  h/o afib/MVR, INR supratherapeutic after an increased dose yesterday, no bleeding reported. At home takes 2.5mg  daily except 5mg  wed/sat. No drug intxns identified. Plan for discharge home today.  Goal of Therapy:  INR=2.5-3.5   Plan:  1. Hold coumadin today 2. Ok to continue home dose after being discharged, and recommend to f/u INR outpatient this Thursday or Friday.  Riki Rusk 01/27/2012,11:27 AM

## 2012-01-27 NOTE — Progress Notes (Signed)
Pt. Given discharge instructions. Discussed instructions with wife [name] and Pt. Questions answered. Picc line site CDI. Pt discharged home. Kandis Fantasia, RN

## 2012-01-27 NOTE — Progress Notes (Signed)
Name: [name] MRN: [id number] DOB: 02-08-1941    LOS: 16  PCCM PROGRESS  NOTE  History of Present Illness:  71 yo male former smoker transferred admitted to [place] on 1/18 with diverticulitis and free air.  Started on Abx, and transferred to [facility] 1/20.  Found to have pneumoperitoneum and developing abscess.  Taken to OR 1/21 for exploratory laparotomy, drainage of intraabdominal abscess, sigmoid colectomy and colostomy. Hx of COPD, CAD, s/p MVR and CABG, DM, A fib, Systolic CHF, Prostate cancer s/p cryosurgery (does intermittent self cath at home)  Lines / Drains: 1/21  ETT>>1/23 1/21  Foley>> 01/23/12, 01/22/11 intermittent self cath daily for his chronic urethral structure per DR Achilles Dunk urologist >> 1/21  R IJ TLC>> 01/23/12 1/21  JP drain>>  Cultures: None  Antibiotics: 1/18  Cipro>>> 1/21 1/18  Flagyl (abd abscess)>>1/21 1/21  Ertapenem (abd abscess)>>>1/31 01/23/12 Nitrafurantoin (for daily intermittent cath foley, home med, pulmonary risks explained ) >>  Tests / Events: 1/12 Echo>>EF 40 to 45%, inferior wall and apical hypokinesis, prosthetic mitral valve  1/20  Abdominal CT>>>pneumoperitoneum / intraabdominal abscess 1/21  Exploratory laparotomy, drainage of intraabdominal abscess, sigmoid colectomy and colostomy 2/1 ready to go to rehab, foley out, central line out 2/1 Non-sustained VT>>transfer to rehab cancelled 2/4 PMR recommend home with home health therapy  SUBJECTIVE: Denies chest pain, dyspnea, abdominal pain.  Walking in hall w/o difficulty.  Vital Signs: Temp:  [97.8 F (36.6 C)-98.8 F (37.1 C)] 97.8 F (36.6 C) (02/05 0441) Pulse Rate:  [84-97] 97  (02/05 0654) Resp:  [16-20] 20  (02/05 0441) BP: (105-139)/(56-77) 127/69 mmHg (02/05 0654) SpO2:  [94 %-97 %] 97 % (02/05 0905) Weight:  [155 lb 6.8 oz (70.5 kg)] 155 lb 6.8 oz (70.5 kg) (02/05 0441) I/O last 3 completed shifts: In: 3460 [P.O.:3460] Out: 150 [Urine:150]  Physical Examination:  General -  no distress HEENT - no sinus tenderness Cardiac - s1s2 with 2/6 SM Chest - no wheeze, rales Abd - soft, nontender, colostomy in place Ext - no edema Neuro - normal strength  Labs and Imaging:   Chest Portable 1 View Post Insertion To Confirm Placement As Interpreted By Radiologist  01/26/2012  *RADIOLOGY REPORT*  Clinical Data: PICC line placement.  PORTABLE CHEST - 1 VIEW  Comparison: Chest x-ray 01/23/2012.  Findings: The heart is enlarged but stable.  Mild vascular congestion and areas of atelectasis are noted.  The right IJ catheter is stable.  The new left-sided PICC line is in good position.  The tip is near the cavoatrial junction.  IMPRESSION: Left PICC line tip in good position near the cavoatrial junction.  Original Report Authenticated By: P. Loralie Champagne, M.D.    Lab 01/27/12 0500 01/24/12 0600 01/23/12 0535  NA 141 144 142  K 3.5 4.0 4.1  CL 107 109 107  CO2 27 28 27   BUN 11 13 15   CREATININE 0.79 0.79 0.89  GLUCOSE 115* 113* 122*    Lab 01/27/12 0500 01/26/12 0500 01/25/12 0500  HGB 8.7* 8.8* 8.8*  HCT 27.0* 27.2* 26.9*  WBC 3.3* 3.8* 5.3  PLT 263 267 275     Assessment and Plan:  Intra-abdominal abscess 2nd to perf diverticulum -s/p sigmoid colectomy/colostomy -completed antibiotics -tolerating diet -will need outpt follow up with [name] in 1 week  Hx of COPD -continue home regimen of symbicort and combivent -followed by primary care as outpt  Post-operative respiratory failure -resolved  Hx of CAD s/p CABG, s/p MVR, chronic  systolic CHF, A fib, dyslipidemia -had episode of non-sustained VT 2/1 -okay for d/c home per cardiology -continue coreg, digoxin, lasix, coumadin per cardiology -f/u INR on 2/8 in [facility] cardiology office -resume outpt doses of aspirin, zocor -hold cozaar -will need outpt f/u with Cardiology in 4 to 6 weeks (Dr. Mariah Milling)  Herpes labialis -improving with zovirax ointment  GERD, SUP -continue  protonix  Diabetes / hyperglycemia -resume outpt regimen of metformin 500 mg bid, and d/c insulin -has diabetic monitoring at home -f/u with PCP  Protein-calorie malnutrition -diet per CCS  Obstructive Uropathy On 01/22/12 Dr Marchelle Gearing  discussed with his urologist Dr Achilles Dunk at [place]. 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 Requires intermittent self cath otheriwse patient will obstruct. If on intermittent self cath, he strongly feels patient needs low dose nitrafurantoin. This is despite risk for pulmonary toxicity which is low but risk of recurrent UTI which is high without nitrafurantoin.  -continue nitrofurantoin, flomax 0.4 mg daily, intermittent self cath  Anemia likely from critical illness -f/u as outpt  Disposition -d/c home 2/5 after PICC line removed  Coralyn Helling, MD 01/27/2012 10:08 AM Pager:  [phone]

## 2012-01-28 ENCOUNTER — Telehealth (INDEPENDENT_AMBULATORY_CARE_PROVIDER_SITE_OTHER): Payer: Self-pay | Admitting: General Surgery

## 2012-01-28 DIAGNOSIS — I4891 Unspecified atrial fibrillation: Secondary | ICD-10-CM | POA: Diagnosis not present

## 2012-01-28 DIAGNOSIS — I10 Essential (primary) hypertension: Secondary | ICD-10-CM | POA: Diagnosis not present

## 2012-01-28 DIAGNOSIS — Z433 Encounter for attention to colostomy: Secondary | ICD-10-CM | POA: Diagnosis not present

## 2012-01-28 DIAGNOSIS — Z5181 Encounter for therapeutic drug level monitoring: Secondary | ICD-10-CM | POA: Diagnosis not present

## 2012-01-28 DIAGNOSIS — J441 Chronic obstructive pulmonary disease with (acute) exacerbation: Secondary | ICD-10-CM | POA: Diagnosis not present

## 2012-01-28 DIAGNOSIS — Z7901 Long term (current) use of anticoagulants: Secondary | ICD-10-CM | POA: Diagnosis not present

## 2012-01-28 DIAGNOSIS — Z954 Presence of other heart-valve replacement: Secondary | ICD-10-CM | POA: Diagnosis not present

## 2012-01-28 DIAGNOSIS — E119 Type 2 diabetes mellitus without complications: Secondary | ICD-10-CM | POA: Diagnosis not present

## 2012-01-28 NOTE — Telephone Encounter (Signed)
PT'S DAUGHTER CALLED TO CLARIFY WHO MR Moder SHOULD SEE AFTER SURGERY/ UPON DISCHARGE YESTERDAY HE WAS INSTRUCTED TO F/U WITH PRIMARY CARE DR./ I REVIEWED THIS WITH DR. Pamella Pert AND HE SAID MR Faidley NEEDED TO BE SEEN IN 2-3 WEEKS FOR RCK/ APPT MADE FOR 02-10-12/ TINA STATED THAT A HOME HEALTH AGENCY WILL BE VISITING MR [place] ON A DAILY BASIS/TINA NOTIFIED OF APPOINTMENT./GY

## 2012-01-28 NOTE — Progress Notes (Signed)
   CARE MANAGEMENT NOTE 01/28/2012  Patient:  [name], [name]   Account Number:  [id number]  Date Initiated:  01/12/2012  Documentation initiated by:  Junius Creamer  Subjective/Objective Assessment:   adm w perforated diverticulum  01-11-11 post op on vent.     Action/Plan:   lives w wife, pcp dr Wonda Cheng   Anticipated DC Date:  01/22/2012   Anticipated DC Plan:  IP REHAB FACILITY      DC Planning Services  CM consult      [facility] Choice  HOME HEALTH   Choice offered to / List presented to:  C-1 Patient        HH arranged  HH-1 RN  HH-2 PT  HH-3 OT      HH agency  OTHER - SEE NOTE   Status of service:  Completed, signed off Medicare Important Message given?   (If response is "NO", the following Medicare IM given date fields will be blank) Date Medicare IM given:   Date Additional Medicare IM given:    Discharge Disposition:  HOME W HOME HEALTH SERVICES  Per UR Regulation:  Reviewed for med. necessity/level of care/duration of stay  Comments:  01/27/12 Stone Spirito,RN,BSN 1300 PT FOR DISCHARGE HOME TODAY WITH HH AS ARRANGED.  NOTIFIED LIFEPATH OF DC AND FAXED DISCHARGE SUMMARY TO HH AGENCY.  01/26/12 Isella Slatten,RN,BSN 1658 PT NOW AMBULATING WITHOUT DIFFICULTY; INPT REHAB HAS DECLINED ADMISSION.  PT IS SAFE TO GO WITH HOME HEALTH CARE.  WILL ARRANGE HOME HEALTH RN, PT AND OT.  PT/WIFE CHOOSE [facility] FOR HOME HEALTH NEEDS.  START OF CARE 01/28/12.  FAXED REFERRAL TO [id number].  PT HAS RW, TUB SEAT.  DENIES NEED FOR 3 IN 1.  01/23/12 Maxine Huynh,RN,BSN PT READY FOR REHAB TODAY, BUT HAD NON-SUSTAINED VTACH. WILL REEVALUATE ON MONDAY FOR CIR.  01/20/12 [name],RN,BSN 1430 PT RECOMMENDING IP REHAB.  WILL OBTAIN ORDERS FOR OT CONSULT AND REHAB CONSULT.  MET WITH PT AND WIFE TO DISCUSS DC PLANS.  PTA, PT INDEPENDENT, LIVES WITH WIFE.  WIFE AVAILABLE AT DISCHARGE TO PROVIDE 24HR CARE.  PT/WIFE VERY INTERESTED IN CIR.  WILL FOLLOW FOR CONSULT.  01-19-12 2:30pm  [name], RNBSN [phone] UR Completed.  01-15-12 1:50pm [name], RNBSN [phone] UR Completed. Now extubated as of 01-14-12  01-13-12 2:30pm [name], RNBSN - [phone] UR completed.  1/21 debbie dowell rn,bsn [phone]

## 2012-01-29 DIAGNOSIS — Z7901 Long term (current) use of anticoagulants: Secondary | ICD-10-CM | POA: Diagnosis not present

## 2012-01-29 DIAGNOSIS — I10 Essential (primary) hypertension: Secondary | ICD-10-CM | POA: Diagnosis not present

## 2012-01-29 DIAGNOSIS — E119 Type 2 diabetes mellitus without complications: Secondary | ICD-10-CM | POA: Diagnosis not present

## 2012-01-29 DIAGNOSIS — I4891 Unspecified atrial fibrillation: Secondary | ICD-10-CM | POA: Diagnosis not present

## 2012-01-29 DIAGNOSIS — Z433 Encounter for attention to colostomy: Secondary | ICD-10-CM | POA: Diagnosis not present

## 2012-01-29 DIAGNOSIS — J441 Chronic obstructive pulmonary disease with (acute) exacerbation: Secondary | ICD-10-CM | POA: Diagnosis not present

## 2012-01-30 ENCOUNTER — Ambulatory Visit (INDEPENDENT_AMBULATORY_CARE_PROVIDER_SITE_OTHER): Payer: Self-pay | Admitting: Internal Medicine

## 2012-01-30 DIAGNOSIS — J441 Chronic obstructive pulmonary disease with (acute) exacerbation: Secondary | ICD-10-CM | POA: Diagnosis not present

## 2012-01-30 DIAGNOSIS — I4891 Unspecified atrial fibrillation: Secondary | ICD-10-CM

## 2012-01-30 DIAGNOSIS — E119 Type 2 diabetes mellitus without complications: Secondary | ICD-10-CM | POA: Diagnosis not present

## 2012-01-30 DIAGNOSIS — Z7901 Long term (current) use of anticoagulants: Secondary | ICD-10-CM

## 2012-01-30 DIAGNOSIS — I10 Essential (primary) hypertension: Secondary | ICD-10-CM | POA: Diagnosis not present

## 2012-01-30 DIAGNOSIS — R0989 Other specified symptoms and signs involving the circulatory and respiratory systems: Secondary | ICD-10-CM

## 2012-01-30 DIAGNOSIS — I059 Rheumatic mitral valve disease, unspecified: Secondary | ICD-10-CM

## 2012-01-30 DIAGNOSIS — Z9889 Other specified postprocedural states: Secondary | ICD-10-CM

## 2012-01-30 DIAGNOSIS — Z433 Encounter for attention to colostomy: Secondary | ICD-10-CM | POA: Diagnosis not present

## 2012-01-31 DIAGNOSIS — E119 Type 2 diabetes mellitus without complications: Secondary | ICD-10-CM | POA: Diagnosis not present

## 2012-01-31 DIAGNOSIS — I10 Essential (primary) hypertension: Secondary | ICD-10-CM | POA: Diagnosis not present

## 2012-01-31 DIAGNOSIS — Z433 Encounter for attention to colostomy: Secondary | ICD-10-CM | POA: Diagnosis not present

## 2012-01-31 DIAGNOSIS — J441 Chronic obstructive pulmonary disease with (acute) exacerbation: Secondary | ICD-10-CM | POA: Diagnosis not present

## 2012-01-31 DIAGNOSIS — I4891 Unspecified atrial fibrillation: Secondary | ICD-10-CM | POA: Diagnosis not present

## 2012-02-01 DIAGNOSIS — I4891 Unspecified atrial fibrillation: Secondary | ICD-10-CM | POA: Diagnosis not present

## 2012-02-01 DIAGNOSIS — J441 Chronic obstructive pulmonary disease with (acute) exacerbation: Secondary | ICD-10-CM | POA: Diagnosis not present

## 2012-02-01 DIAGNOSIS — E119 Type 2 diabetes mellitus without complications: Secondary | ICD-10-CM | POA: Diagnosis not present

## 2012-02-01 DIAGNOSIS — Z433 Encounter for attention to colostomy: Secondary | ICD-10-CM | POA: Diagnosis not present

## 2012-02-01 DIAGNOSIS — I10 Essential (primary) hypertension: Secondary | ICD-10-CM | POA: Diagnosis not present

## 2012-02-03 ENCOUNTER — Telehealth: Payer: Self-pay | Admitting: *Deleted

## 2012-02-03 DIAGNOSIS — I4891 Unspecified atrial fibrillation: Secondary | ICD-10-CM | POA: Diagnosis not present

## 2012-02-03 DIAGNOSIS — Z433 Encounter for attention to colostomy: Secondary | ICD-10-CM | POA: Diagnosis not present

## 2012-02-03 DIAGNOSIS — J441 Chronic obstructive pulmonary disease with (acute) exacerbation: Secondary | ICD-10-CM | POA: Diagnosis not present

## 2012-02-03 DIAGNOSIS — E119 Type 2 diabetes mellitus without complications: Secondary | ICD-10-CM | POA: Diagnosis not present

## 2012-02-03 DIAGNOSIS — I10 Essential (primary) hypertension: Secondary | ICD-10-CM | POA: Diagnosis not present

## 2012-02-03 NOTE — Telephone Encounter (Signed)
Spoke with [name], advised pt is not a new pt to me in [facility] and he can be added to my schedule for 02/04/12 in an est coumadin slot.  [name] will call pt to schedule.

## 2012-02-03 NOTE — Telephone Encounter (Signed)
pt's wife called and states she would like for her husband to have INR checked wed, states they are not going to have home health/hospice check anymore and they want [facility] to check INR now. I advised pt that we needed to look @ the schedule since he would be a new pt having his INR checked with [facility], pt sees Dr. Tenny Craw already. I will send my note to Cloyde Reams, RN in our [address] office and advised Mrs. Gaertner that we will cb later today as to what time to have pt come in tomorrow for INR check with the nurse; Mrs. Hoopes gave verbal understanding today. Danielle Rankin

## 2012-02-04 ENCOUNTER — Ambulatory Visit (INDEPENDENT_AMBULATORY_CARE_PROVIDER_SITE_OTHER): Payer: Medicare Other | Admitting: Emergency Medicine

## 2012-02-04 DIAGNOSIS — Z9889 Other specified postprocedural states: Secondary | ICD-10-CM | POA: Diagnosis not present

## 2012-02-04 DIAGNOSIS — I4891 Unspecified atrial fibrillation: Secondary | ICD-10-CM

## 2012-02-04 DIAGNOSIS — Z7901 Long term (current) use of anticoagulants: Secondary | ICD-10-CM

## 2012-02-04 DIAGNOSIS — I059 Rheumatic mitral valve disease, unspecified: Secondary | ICD-10-CM

## 2012-02-06 ENCOUNTER — Telehealth (INDEPENDENT_AMBULATORY_CARE_PROVIDER_SITE_OTHER): Payer: Self-pay

## 2012-02-06 NOTE — Telephone Encounter (Signed)
Mrs. Surrette called about husbands bowel output since yesterday.  She stated it was less than yesterday however still functioning. Colon resection  On 01/12/2012. Eating well, no pain or distress. Advised to monitor the output over the next 12 hours if things should worsen or not get any better to call back we do have providers on call over the weekend.

## 2012-02-10 ENCOUNTER — Encounter (INDEPENDENT_AMBULATORY_CARE_PROVIDER_SITE_OTHER): Payer: Self-pay | Admitting: General Surgery

## 2012-02-10 ENCOUNTER — Ambulatory Visit (INDEPENDENT_AMBULATORY_CARE_PROVIDER_SITE_OTHER): Payer: Medicare Other | Admitting: General Surgery

## 2012-02-10 VITALS — BP 110/68 | Temp 97.4°F | Ht 67.0 in | Wt 156.6 lb

## 2012-02-10 DIAGNOSIS — Z9889 Other specified postprocedural states: Secondary | ICD-10-CM

## 2012-02-10 NOTE — Patient Instructions (Signed)
Continue current wound care.  You may shower.

## 2012-02-10 NOTE — Progress Notes (Signed)
Operation: Emergency sigmoid colectomy and colostomy  Date: January 12, 2012  Pathology: Perforated sigmoid diverticulitis  HPI: He is here for his first postoperative visit. His midline wound is healing by secondary intention. The colostomy is working but has some areas of breakdown of the skin around it. His appetite is slowly returning.   Physical Exam: General-no acute distress  Abdomen-soft, midline incision has some open areas with exposed ends of suture and no purulent drainage. Colostomy in left abdomen has retracted a little but is viable. Small area of skin breakdown laterally.   Assessment: Progressing slowly. Wound healing by secondary intention. Mild skin breakdown and lateral aspect of colostomy.  Plan: Continue current care midline wound. Refer to feel for medical proceed and get some sort of barrier for the area of skin breakdown. Return visit 3-4 weeks.

## 2012-02-11 ENCOUNTER — Ambulatory Visit (INDEPENDENT_AMBULATORY_CARE_PROVIDER_SITE_OTHER): Payer: Medicare Other | Admitting: *Deleted

## 2012-02-11 ENCOUNTER — Encounter: Payer: Self-pay | Admitting: *Deleted

## 2012-02-11 DIAGNOSIS — Z7901 Long term (current) use of anticoagulants: Secondary | ICD-10-CM | POA: Diagnosis not present

## 2012-02-11 DIAGNOSIS — I059 Rheumatic mitral valve disease, unspecified: Secondary | ICD-10-CM | POA: Diagnosis not present

## 2012-02-11 DIAGNOSIS — I4891 Unspecified atrial fibrillation: Secondary | ICD-10-CM | POA: Diagnosis not present

## 2012-02-11 DIAGNOSIS — Z9889 Other specified postprocedural states: Secondary | ICD-10-CM

## 2012-02-11 LAB — POCT INR: INR: 2.4

## 2012-02-13 ENCOUNTER — Encounter: Payer: Self-pay | Admitting: Cardiovascular Disease

## 2012-02-13 ENCOUNTER — Ambulatory Visit (INDEPENDENT_AMBULATORY_CARE_PROVIDER_SITE_OTHER): Payer: Medicare Other | Admitting: Cardiovascular Disease

## 2012-02-13 DIAGNOSIS — I10 Essential (primary) hypertension: Secondary | ICD-10-CM

## 2012-02-13 DIAGNOSIS — Z9889 Other specified postprocedural states: Secondary | ICD-10-CM | POA: Diagnosis not present

## 2012-02-13 DIAGNOSIS — I25810 Atherosclerosis of coronary artery bypass graft(s) without angina pectoris: Secondary | ICD-10-CM | POA: Diagnosis not present

## 2012-02-13 DIAGNOSIS — J4489 Other specified chronic obstructive pulmonary disease: Secondary | ICD-10-CM

## 2012-02-13 DIAGNOSIS — J449 Chronic obstructive pulmonary disease, unspecified: Secondary | ICD-10-CM | POA: Diagnosis not present

## 2012-02-13 DIAGNOSIS — I4891 Unspecified atrial fibrillation: Secondary | ICD-10-CM

## 2012-02-13 MED ORDER — CARVEDILOL 12.5 MG PO TABS: 12.5000 mg | ORAL_TABLET | Freq: Two times a day (BID) | ORAL | Status: DC

## 2012-02-13 NOTE — Assessment & Plan Note (Signed)
Currently with no symptoms of angina. No further workup at this time. Continue current medication regimen. 

## 2012-02-13 NOTE — Patient Instructions (Signed)
You are doing well. Please increase the coreg to 1 1/2 tabs twice a day Decrease the losartan to 1/2 tab once a day  Please call us if you have new issues that need to be addressed before your next appt.  Your physician wants you to follow-up in: 3 months.  You will receive a reminder letter in the mail two months in advance. If you don't receive a letter, please call our office to schedule the follow-up appointment.

## 2012-02-13 NOTE — Assessment & Plan Note (Signed)
Several recent episodes of severe COPD requiring hospitalization, most recently in January 2013 leading to large bowel perforation while on steroids.

## 2012-02-13 NOTE — Assessment & Plan Note (Addendum)
Functioning well by echocardiogram January 2012.

## 2012-02-13 NOTE — Progress Notes (Signed)
Patient ID: [name], male    DOB: 03-11-1941, 71 y.o.   MRN: [id number]  HPI Comments: [name] is a very pleasant 71 year old gentleman with a past medical history of coronary artery disease, bypass surgery with a  left internal mammary to the LAD and saphenous vein graft to the OM in 1989, PCI of the RCA in 2002, COPD, mitral valve replacement with prosthetic valve on Coumadin, history of chronic atrial fibrillation, diabetes, hypertension, hyperlipidemia, PNA in 03/2010, who presents for routine followup.    admitted to the hospital from September 04 2011 with atrial fibrillation with RVR, chest pain, bronchitis, COPD exacerbation. He was treated with steroids, antibiotics, rate control for his atrial fibrillation with improvement of his symptoms.   He was admitted again to the hospital January 15 with shortness of breath and congestion felt to have COPD exacerbation, started on her buttocks, nebulizers, steroids. He suffered a perforated bowel with air under his diaphragm and was transferred to [facility] and within 48 hours, had surgery performed. He presents today with an ostomy. 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 He had a 24 pound weight loss through this procedure, open abdominal wall wound healing by secondary intention.  He was discharged with underlying anemia, hematocrit 27 INR of 4   [name] had his last stress test in 2009, that showed ejection fraction 43%, inferior wall infarct with no ischemia. MV was functioning well.    echocardiogram done recently confirms prosthetic mitral valve is working well with no significant regurgitation, ejection fraction 40-45% with inferior hypokinesis.   EKG shows atrial fibrillation with rate 100 beats per minute,Right bundle branch block, left anterior fascicular block,  no significant ST or T wave changes     Outpatient Encounter  Prescriptions as of 02/13/2012  Medication Sig Dispense Refill  . acyclovir ointment (ZOVIRAX) 5 % Apply topically every 3 (three) hours.  1 g  1  . albuterol-ipratropium (COMBIVENT) 18-103 MCG/ACT inhaler Inhale 2 puffs into the lungs as needed. For shortness of breath      . aspirin EC 81 MG tablet Take 81 mg by mouth daily.      . budesonide-formoterol (SYMBICORT) 160-4.5 MCG/ACT inhaler Inhale 2 puffs into the lungs 2 (two) times daily.      . Cholecalciferol (VITAMIN D3) 1000 UNITS CAPS Take 1 capsule by mouth daily.        Marland Kitchen dextromethorphan-guaiFENesin (MUCINEX DM) 30-600 MG per 12 hr tablet Take 1 tablet by mouth every 12 (twelve) hours.      . digoxin (LANOXIN) 0.125 MG tablet Take 125 mcg by mouth daily.      . furosemide (LASIX) 20 MG tablet Take 20 mg by mouth daily.      Marland Kitchen latanoprost (XALATAN) 0.005 % ophthalmic solution Place 1 drop into both eyes at bedtime.  2.5 mL  1  . losartan (COZAAR) 100 MG tablet Take 100 mg by mouth daily.      . metFORMIN (GLUCOPHAGE) 500 MG tablet Take 500 mg by mouth 2 (two) times daily with a meal.       . nitrofurantoin (MACRODANTIN) 100 MG capsule Take 100 mg by mouth daily.      . simvastatin (ZOCOR) 40 MG tablet Take 40 mg by mouth at bedtime.        . Tamsulosin HCl (FLOMAX) 0.4 MG CAPS Take 0.4 mg by mouth daily.       Marland Kitchen  traMADol (ULTRAM) 50 MG tablet Take 50 mg by mouth every 6 (six) hours as needed.      . warfarin (COUMADIN) 2.5 MG tablet Take 2.5 mg by mouth daily. Take 2.5 mg on Mon, Tues, Thurs,Fri, and Sun. Take 5 mg on Wed and Saturday.      .  carvedilol (COREG) 12.5 MG tablet Take 12.5 mg by mouth 2 (two) times daily with a meal.         Review of Systems  Constitutional: Positive for fatigue.  HENT: Negative.        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   Cardiovascular: Negative.   Gastrointestinal: Negative.   Musculoskeletal: Negative.   Skin: Negative.   Neurological: Positive for weakness.    Hematological: Negative.   Psychiatric/Behavioral: Negative.   All other systems reviewed and are negative.   BP 120/60  Pulse 100  Ht 5\' 7"  (1.702 m)  Wt 160 lb (72.576 kg)  BMI 25.06 kg/m2   Physical Exam  Nursing note and vitals reviewed. Constitutional: He is oriented to person, place, and time. He appears well-developed and well-nourished.  HENT:  Head: Normocephalic.  Nose: Nose normal.  Mouth/Throat: Oropharynx is clear and moist.       Several sores noted on his lips in 3 places that are healing  Eyes: Conjunctivae are normal. Pupils are equal, round, and reactive to light.  Neck: Normal range of motion. Neck supple. No JVD present.  Cardiovascular: Normal rate, regular rhythm, S1 normal, S2 normal and intact distal pulses.  Exam reveals no gallop and no friction rub.   Murmur heard.  Crescendo systolic murmur is present with a grade of 2/6  Pulmonary/Chest: Effort normal. No respiratory distress. He has decreased breath sounds. He has no wheezes. He has no rales. He exhibits no tenderness.  Abdominal: Soft. Bowel sounds are normal. He exhibits no distension. There is no tenderness.       Ostomy in place  Musculoskeletal: Normal range of motion. He exhibits no edema and no tenderness.  Lymphadenopathy:    He has no cervical adenopathy.  Neurological: He is alert and oriented to person, place, and time. Coordination normal.  Skin: Skin is warm and dry. No rash noted. No erythema.  Psychiatric: He has a normal mood and affect. His behavior is normal. Judgment and thought content normal.           Assessment and Plan

## 2012-02-13 NOTE — Assessment & Plan Note (Signed)
Heart rate mildly elevated on today's visit. Suspect as could be secondary to underlying ostomy, deconditioning, anemia. We'll increase his Coreg to one and a half tabs b.i.d. Of the 12.5 mg pill, and will decrease losartan to 50 mg daily. He is on warfarin

## 2012-02-13 NOTE — Assessment & Plan Note (Signed)
Medication changes as above with slight increase in Coreg, decrease in losartan for better heart rate control.

## 2012-02-16 ENCOUNTER — Telehealth (INDEPENDENT_AMBULATORY_CARE_PROVIDER_SITE_OTHER): Payer: Self-pay

## 2012-02-16 NOTE — Telephone Encounter (Signed)
I spoke with [name] today and confirmed they had requested d/c from home health.  She told me they had three nurses in the family and they were doing just fine caring for the pt's ostomy.  She is ordering her ostomy supplies.  I also spoke with Lillia Abed, at [facility] and reconfirmed that the patient's wife had requested the d/c.

## 2012-02-17 ENCOUNTER — Telehealth: Payer: Self-pay | Admitting: *Deleted

## 2012-02-17 ENCOUNTER — Other Ambulatory Visit: Payer: Self-pay | Admitting: Cardiovascular Disease

## 2012-02-17 DIAGNOSIS — I10 Essential (primary) hypertension: Secondary | ICD-10-CM

## 2012-02-17 MED ORDER — LOSARTAN POTASSIUM 100 MG PO TABS: 50.0000 mg | ORAL_TABLET | Freq: Every day | ORAL | Status: DC

## 2012-02-17 MED ORDER — CARVEDILOL 12.5 MG PO TABS: 18.7500 mg | ORAL_TABLET | Freq: Two times a day (BID) | ORAL | Status: DC

## 2012-02-17 NOTE — Telephone Encounter (Signed)
See phone note

## 2012-02-17 NOTE — Telephone Encounter (Signed)
Coreg is 1 1/2 tablets 2 times daily

## 2012-02-19 DIAGNOSIS — IMO0002 Reserved for concepts with insufficient information to code with codable children: Secondary | ICD-10-CM | POA: Diagnosis not present

## 2012-02-19 DIAGNOSIS — N318 Other neuromuscular dysfunction of bladder: Secondary | ICD-10-CM | POA: Diagnosis not present

## 2012-02-19 DIAGNOSIS — R339 Retention of urine, unspecified: Secondary | ICD-10-CM | POA: Diagnosis not present

## 2012-02-19 DIAGNOSIS — C61 Malignant neoplasm of prostate: Secondary | ICD-10-CM | POA: Diagnosis not present

## 2012-02-25 ENCOUNTER — Ambulatory Visit (INDEPENDENT_AMBULATORY_CARE_PROVIDER_SITE_OTHER): Payer: Medicare Other

## 2012-02-25 DIAGNOSIS — I059 Rheumatic mitral valve disease, unspecified: Secondary | ICD-10-CM | POA: Diagnosis not present

## 2012-02-25 DIAGNOSIS — Z7901 Long term (current) use of anticoagulants: Secondary | ICD-10-CM

## 2012-02-25 DIAGNOSIS — I4891 Unspecified atrial fibrillation: Secondary | ICD-10-CM | POA: Diagnosis not present

## 2012-02-25 DIAGNOSIS — Z9889 Other specified postprocedural states: Secondary | ICD-10-CM | POA: Diagnosis not present

## 2012-02-25 LAB — POCT INR: INR: 3

## 2012-03-08 ENCOUNTER — Ambulatory Visit (INDEPENDENT_AMBULATORY_CARE_PROVIDER_SITE_OTHER): Payer: Medicare Other | Admitting: General Surgery

## 2012-03-08 ENCOUNTER — Encounter (INDEPENDENT_AMBULATORY_CARE_PROVIDER_SITE_OTHER): Payer: Self-pay | Admitting: General Surgery

## 2012-03-08 VITALS — BP 136/74 | HR 70 | Temp 97.9°F | Resp 18 | Ht 67.0 in | Wt 167.6 lb

## 2012-03-08 DIAGNOSIS — Z9889 Other specified postprocedural states: Secondary | ICD-10-CM

## 2012-03-08 NOTE — Patient Instructions (Signed)
Avoid overeating and weight gain. Activities as tolerated. Avoid repetitive heavy lifting.

## 2012-03-08 NOTE — Progress Notes (Signed)
Operation: Emergency sigmoid colectomy and colostomy  Date: January 12, 2012  Pathology: Perforated sigmoid diverticulitis  HPI: He is here for another postoperative visit.  He wound is almost completely healed.  He is slowly regaining his strength.  He is gaining weight.  Physical Exam: General-no acute distress  Abdomen-soft, midline incision has some superficial open areas with exposed ends of suture (these were removed) and no purulent drainage.   Assessment: Wound is almost completely healed and he is doing better overall.  Plan:  Dry dressing to wound.  Activities as tolerated.  Avoid any more weight gain.  Return visit in one month.

## 2012-03-17 ENCOUNTER — Ambulatory Visit (INDEPENDENT_AMBULATORY_CARE_PROVIDER_SITE_OTHER): Payer: Medicare Other

## 2012-03-17 DIAGNOSIS — I4891 Unspecified atrial fibrillation: Secondary | ICD-10-CM

## 2012-03-17 DIAGNOSIS — I059 Rheumatic mitral valve disease, unspecified: Secondary | ICD-10-CM | POA: Diagnosis not present

## 2012-03-17 DIAGNOSIS — Z9889 Other specified postprocedural states: Secondary | ICD-10-CM

## 2012-03-17 DIAGNOSIS — Z7901 Long term (current) use of anticoagulants: Secondary | ICD-10-CM

## 2012-03-17 LAB — POCT INR: INR: 2.7

## 2012-04-06 ENCOUNTER — Encounter (INDEPENDENT_AMBULATORY_CARE_PROVIDER_SITE_OTHER): Payer: Self-pay | Admitting: General Surgery

## 2012-04-06 ENCOUNTER — Ambulatory Visit (INDEPENDENT_AMBULATORY_CARE_PROVIDER_SITE_OTHER): Payer: Medicare Other | Admitting: General Surgery

## 2012-04-06 ENCOUNTER — Other Ambulatory Visit (INDEPENDENT_AMBULATORY_CARE_PROVIDER_SITE_OTHER): Payer: Self-pay | Admitting: General Surgery

## 2012-04-06 VITALS — BP 122/56 | HR 68 | Temp 97.4°F | Resp 20 | Ht 67.0 in | Wt 166.4 lb

## 2012-04-06 DIAGNOSIS — K631 Perforation of intestine (nontraumatic): Secondary | ICD-10-CM

## 2012-04-06 DIAGNOSIS — Z9889 Other specified postprocedural states: Secondary | ICD-10-CM

## 2012-04-06 NOTE — Patient Instructions (Signed)
Call and arrange to have your colonoscopy done.

## 2012-04-06 NOTE — Progress Notes (Signed)
Operation: Emergency sigmoid colectomy and colostomy  Date: January 12, 2012  Pathology: Perforated sigmoid diverticulitis  HPI: He is here for another postoperative visit.  He wound is  completely healed. He is still a little weak at times.  He is due to have a colonoscopy.  Have some mucoid discharge from rectum. Physical Exam: General-no acute distress  Abdomen-soft, midline incision is healed, LLQ colostomy is present and draining stool.    Assessment: Wound is healed.  Energy level still not back to normal.  Needs colonoscopy.  Plan: Referral for colonoscopy through colostomy and rectum.  Return visit in 2 months.

## 2012-04-07 ENCOUNTER — Encounter: Payer: Self-pay | Admitting: Internal Medicine

## 2012-04-14 ENCOUNTER — Ambulatory Visit (INDEPENDENT_AMBULATORY_CARE_PROVIDER_SITE_OTHER): Payer: Medicare Other

## 2012-04-14 DIAGNOSIS — J9801 Acute bronchospasm: Secondary | ICD-10-CM | POA: Diagnosis not present

## 2012-04-14 DIAGNOSIS — Z7901 Long term (current) use of anticoagulants: Secondary | ICD-10-CM | POA: Diagnosis not present

## 2012-04-14 DIAGNOSIS — Z9889 Other specified postprocedural states: Secondary | ICD-10-CM

## 2012-04-14 DIAGNOSIS — I059 Rheumatic mitral valve disease, unspecified: Secondary | ICD-10-CM | POA: Diagnosis not present

## 2012-04-14 DIAGNOSIS — E7800 Pure hypercholesterolemia, unspecified: Secondary | ICD-10-CM | POA: Diagnosis not present

## 2012-04-14 DIAGNOSIS — I4891 Unspecified atrial fibrillation: Secondary | ICD-10-CM | POA: Diagnosis not present

## 2012-04-14 DIAGNOSIS — E119 Type 2 diabetes mellitus without complications: Secondary | ICD-10-CM | POA: Diagnosis not present

## 2012-04-14 LAB — POCT INR: INR: 2.4

## 2012-04-20 DIAGNOSIS — IMO0002 Reserved for concepts with insufficient information to code with codable children: Secondary | ICD-10-CM | POA: Diagnosis not present

## 2012-04-28 DIAGNOSIS — J209 Acute bronchitis, unspecified: Secondary | ICD-10-CM | POA: Diagnosis not present

## 2012-04-29 DIAGNOSIS — H4010X0 Unspecified open-angle glaucoma, stage unspecified: Secondary | ICD-10-CM | POA: Diagnosis not present

## 2012-04-30 ENCOUNTER — Ambulatory Visit (INDEPENDENT_AMBULATORY_CARE_PROVIDER_SITE_OTHER): Payer: Medicare Other | Admitting: Internal Medicine

## 2012-04-30 VITALS — BP 130/60 | HR 76 | Ht 67.0 in | Wt 164.6 lb

## 2012-04-30 DIAGNOSIS — Z1211 Encounter for screening for malignant neoplasm of colon: Secondary | ICD-10-CM

## 2012-04-30 DIAGNOSIS — Z8719 Personal history of other diseases of the digestive system: Secondary | ICD-10-CM

## 2012-04-30 DIAGNOSIS — I4891 Unspecified atrial fibrillation: Secondary | ICD-10-CM

## 2012-04-30 DIAGNOSIS — Z860100 Personal history of colon polyps, unspecified: Secondary | ICD-10-CM | POA: Insufficient documentation

## 2012-04-30 DIAGNOSIS — Z954 Presence of other heart-valve replacement: Secondary | ICD-10-CM

## 2012-04-30 DIAGNOSIS — Z7901 Long term (current) use of anticoagulants: Secondary | ICD-10-CM

## 2012-04-30 DIAGNOSIS — Z952 Presence of prosthetic heart valve: Secondary | ICD-10-CM

## 2012-04-30 MED ORDER — PEG-KCL-NACL-NASULF-NA ASC-C 100 G PO SOLR: 1.0000 | Freq: Once | ORAL | Status: DC

## 2012-04-30 NOTE — Patient Instructions (Signed)
You have been scheduled for a colonoscopy with propofol. Please follow written instructions given to you at your visit today.  Please pick up your prep kit at the pharmacy within the next 1-3 days.  Also buy one fleets enema and use thru rectum one hour prior to procedure.  You will be contaced by our office prior to your procedure for directions on holding your Coumadin/Warfarin.  If you do not hear from our office 1 week prior to your scheduled procedure, please call [phone] to discuss.

## 2012-04-30 NOTE — Progress Notes (Signed)
  Subjective:    Patient ID: [name], male    DOB: [birth year], 71 y.o.   MRN: [id number]  HPI 71 yo wm with a history of adenomatous colon polyps due for repeat colonoscopy around 11/2011. However, he had problems with diverticulitis with abscess and perforation and had a sigmoid colectomy and colostomy by [name] in 12/2011. He also had sepsis syndrome and critical illness.He has recovered from that and is preparing for colostomy reversal later this year. He has atrial fibrillation and is s/p MVR and takes warfarin. In the past he has bridged with enoxaparin when holding warfarin. He denies bleeding or problems with bowel or colostomy function now. He did have some rectal bleeding prior to surgery for diverticulitis.  GI ROs otherwise negative.  Medications, allergies, past medical history, past surgical history, family history and social history are reviewed and updated in the EMR.   Review of Systems + excessive urination, hematuria and urine leakage, some dyspnea All other ROS negative or as in HPI    Objective:   Physical Exam General:  NAD Eyes:   anicteric Lungs:  clear Heart:  Metallic S1S2 no rubs, murmurs or gallops Abdomen:  soft and nontender, BS+, colostomy LLQ Ext:   no edema    Data Reviewed:  CT scan 12/2011 Operative note 12/2011     Assessment & Plan:   1. Personal history of adenomatous colonic polyps   2. Special screening for malignant neoplasms, colon   3. Warfarin anticoagulation   4. Atrial fibrillation   5. History of diverticulitis of colon s/p sigmoid resection with current colostomy  6. H/O mitral valve replacement    Plan for screening and surveillance colonoscopy via colostomy and inspection of rectal stump off warfarin with Lovenox bridging. He has some trips planned so aim for late June or early July as he says colostomy reversal date will be September. The risks and benefits as well as alternatives of endoscopic procedure(s) have been  discussed and reviewed. Additional embolic risks discussed also. All questions answered. The patient agrees to proceed.   Cc: Jerl Mina, MD, and Avel Peace, MD

## 2012-05-01 ENCOUNTER — Encounter: Payer: Self-pay | Admitting: Internal Medicine

## 2012-05-12 ENCOUNTER — Ambulatory Visit (INDEPENDENT_AMBULATORY_CARE_PROVIDER_SITE_OTHER): Payer: Medicare Other

## 2012-05-12 ENCOUNTER — Ambulatory Visit (INDEPENDENT_AMBULATORY_CARE_PROVIDER_SITE_OTHER): Payer: Medicare Other | Admitting: Cardiovascular Disease

## 2012-05-12 ENCOUNTER — Encounter: Payer: Self-pay | Admitting: Cardiovascular Disease

## 2012-05-12 VITALS — BP 128/70 | HR 63 | Ht 66.0 in | Wt 165.0 lb

## 2012-05-12 DIAGNOSIS — I059 Rheumatic mitral valve disease, unspecified: Secondary | ICD-10-CM | POA: Diagnosis not present

## 2012-05-12 DIAGNOSIS — I5022 Chronic systolic (congestive) heart failure: Secondary | ICD-10-CM | POA: Diagnosis not present

## 2012-05-12 DIAGNOSIS — E119 Type 2 diabetes mellitus without complications: Secondary | ICD-10-CM

## 2012-05-12 DIAGNOSIS — Z7901 Long term (current) use of anticoagulants: Secondary | ICD-10-CM | POA: Diagnosis not present

## 2012-05-12 DIAGNOSIS — Z9889 Other specified postprocedural states: Secondary | ICD-10-CM

## 2012-05-12 DIAGNOSIS — J449 Chronic obstructive pulmonary disease, unspecified: Secondary | ICD-10-CM

## 2012-05-12 DIAGNOSIS — I25810 Atherosclerosis of coronary artery bypass graft(s) without angina pectoris: Secondary | ICD-10-CM

## 2012-05-12 DIAGNOSIS — I4891 Unspecified atrial fibrillation: Secondary | ICD-10-CM

## 2012-05-12 DIAGNOSIS — E785 Hyperlipidemia, unspecified: Secondary | ICD-10-CM

## 2012-05-12 LAB — POCT INR: INR: 3

## 2012-05-12 MED ORDER — IPRATROPIUM-ALBUTEROL 0.5-2.5 (3) MG/3ML IN SOLN: 3.0000 mL | Freq: Four times a day (QID) | RESPIRATORY_TRACT | Status: DC | PRN

## 2012-05-12 MED ORDER — ALBUTEROL SULFATE (5 MG/ML) 0.5% IN NEBU: 2.5000 mg | INHALATION_SOLUTION | Freq: Four times a day (QID) | RESPIRATORY_TRACT | Status: DC | PRN

## 2012-05-12 NOTE — Assessment & Plan Note (Signed)
Cholesterol is at goal on the current lipid regimen. No changes to the medications were made.  

## 2012-05-12 NOTE — Assessment & Plan Note (Signed)
Mechanical valve placed 5 years ago, on warfarin. We'll continue echocardiogram once a year.

## 2012-05-12 NOTE — Assessment & Plan Note (Signed)
Currently with no symptoms of angina. No further workup at this time. Continue current medication regimen. 

## 2012-05-12 NOTE — Assessment & Plan Note (Signed)
Chronic atrial fibrillation, rate is well-controlled.

## 2012-05-12 NOTE — Progress Notes (Signed)
Addended by: Sabino Snipes E on: 05/12/2012 10:36 AM   Modules accepted: Orders

## 2012-05-12 NOTE — Assessment & Plan Note (Signed)
We have encouraged continued exercise, careful diet management in an effort to lose weight. 

## 2012-05-12 NOTE — Assessment & Plan Note (Signed)
Severe COPD, several admissions to the hospital earlier 2013 for COPD exacerbation and pneumonia. Currently with cough, wheezing, resolving pneumonia or bronchitis. Will prescribe DuoNeb nebulizer medication.

## 2012-05-12 NOTE — Progress Notes (Signed)
Patient ID: [name], male    DOB: 04/05/1941, 71 y.o.   MRN: [id number]  HPI Comments: [name] is a very pleasant 71 year old gentleman with a past medical history of coronary artery disease, bypass surgery with a  left internal mammary to the LAD and saphenous vein graft to the OM in 1989, PCI of the RCA in 2002, COPD, mitral valve replacement with prosthetic valve on Coumadin, history of chronic atrial fibrillation, diabetes, hypertension, hyperlipidemia, PNA in 03/2010, who presents for routine followup.    admitted to the hospital from September 04 2011 with atrial fibrillation with RVR, chest pain, bronchitis, COPD exacerbation. He was treated with steroids, antibiotics, rate control for his atrial fibrillation with improvement of his symptoms.   He was admitted again to the hospital January 15 with shortness of breath and congestion felt to have COPD exacerbation, started on her buttocks, nebulizers, steroids. He suffered a perforated bowel with air under his diaphragm and was transferred to [facility] and within 48 hours, had surgery performed. He presents today with an ostomy. 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 He had a 24 pound weight loss through this procedure, open abdominal wall wound healing by secondary intention.  He was discharged with underlying anemia, hematocrit 27 INR of 4  He presents today and reports that overall he is feeling well. He does have a cough, recent thick productive sputum for the past several weeks. He [facility], received a shot of penicillin and started on oral antibiotics. Sputum has changed color from yellow to clear. He continues to have some wheezing and cough and worries that it is not gone yet. No significant chest pain. He is scheduled for colonoscopy in July for preoperative evaluation prior to reversal of his ostomy in  September.   [name] had his last stress test in 2009, that showed ejection fraction 43%, inferior wall infarct with no ischemia. MV was functioning well.    echocardiogram done recently confirms prosthetic mitral valve is working well with no significant regurgitation, ejection fraction 40-45% with inferior hypokinesis.   EKG shows atrial fibrillation with rate 63 beats per minute,Right bundle branch block, left anterior fascicular block,  no significant ST or T wave changes     Outpatient Encounter Prescriptions as of 05/12/2012  Medication Sig Dispense Refill  . acyclovir ointment (ZOVIRAX) 5 % Apply topically every 3 (three) hours.  1 g  1  . albuterol-ipratropium (COMBIVENT) 18-103 MCG/ACT inhaler Inhale 2 puffs into the lungs as needed. For shortness of breath      . budesonide-formoterol (SYMBICORT) 160-4.5 MCG/ACT inhaler Inhale 2 puffs into the lungs 2 (two) times daily.      . carvedilol (COREG) 12.5 MG tablet Take 1.5 tablets (18.75 mg total) by mouth 2 (two) times daily with a meal.  270 tablet  3  . Cholecalciferol (VITAMIN D3) 1000 UNITS CAPS Take 1 capsule by mouth daily.        Marland Kitchen dextromethorphan-guaiFENesin (MUCINEX DM) 30-600 MG per 12 hr tablet Take 1 tablet by mouth every 12 (twelve) hours.      . digoxin (LANOXIN) 0.125 MG tablet Take 125 mcg by mouth daily.      . furosemide (LASIX) 20 MG tablet Take 20 mg by mouth daily.      Marland Kitchen latanoprost (XALATAN) 0.005 % ophthalmic solution Place 1 drop into both eyes at bedtime.  2.5  mL  1  . losartan (COZAAR) 100 MG tablet Take 0.5 tablets (50 mg total) by mouth daily.  90 tablet  3  . metFORMIN (GLUCOPHAGE) 500 MG tablet Take 500 mg by mouth daily with breakfast.       . nitrofurantoin, macrocrystal-monohydrate, (MACROBID) 100 MG capsule Take 1 capsule twice weekly      . OVER THE COUNTER MEDICATION Take 1 tablet by mouth daily. Preservision Eye Vitamins      . peg 3350 powder (MOVIPREP) 100 G SOLR Take 1 kit (100 g total) by  mouth once.  1 kit  0  . simvastatin (ZOCOR) 40 MG tablet Take 40 mg by mouth at bedtime.       . Tamsulosin HCl (FLOMAX) 0.4 MG CAPS Take 0.4 mg by mouth daily.       Marland Kitchen warfarin (COUMADIN) 2.5 MG tablet Take 2.5 mg by mouth daily. Take 2.5 mg on Mon, Tues, Thurs,Fri, and Sun. Take 5 mg on Wed and Saturday.      Marland Kitchen ipratropium-albuterol (DUONEB) 0.5-2.5 (3) MG/3ML SOLN Take 3 mLs by nebulization every 6 (six) hours as needed.  360 mL  12    Review of Systems  HENT: Negative.        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   Respiratory: Positive for cough, shortness of breath and wheezing.   Cardiovascular: Negative.   Gastrointestinal: Negative.   Musculoskeletal: Negative.   Skin: Negative.   Hematological: Negative.   Psychiatric/Behavioral: Negative.   All other systems reviewed and are negative.   BP 128/70  Pulse 63  Ht 5\' 6"  (1.676 m)  Wt 165 lb (74.844 kg)  BMI 26.63 kg/m2  Physical Exam  Nursing note and vitals reviewed. Constitutional: He is oriented to person, place, and time. He appears well-developed and well-nourished.  HENT:  Head: Normocephalic.  Nose: Nose normal.  Mouth/Throat: Oropharynx is clear and moist.  Eyes: Conjunctivae are normal. Pupils are equal, round, and reactive to light.  Neck: Normal range of motion. Neck supple. No JVD present.  Cardiovascular: Normal rate, regular rhythm, S1 normal, S2 normal and intact distal pulses.  Exam reveals no gallop and no friction rub.   Murmur heard.  Crescendo systolic murmur is present with a grade of 2/6  Pulmonary/Chest: Effort normal. No respiratory distress. He has decreased breath sounds. He has no wheezes. He has no rales. He exhibits no tenderness.       Wheezing and rhonchi in the left base  Abdominal: Soft. Bowel sounds are normal. He exhibits no distension. There is no tenderness.       Ostomy in place  Musculoskeletal: Normal range of motion. He exhibits no edema and no  tenderness.  Lymphadenopathy:    He has no cervical adenopathy.  Neurological: He is alert and oriented to person, place, and time. Coordination normal.  Skin: Skin is warm and dry. No rash noted. No erythema.  Psychiatric: He has a normal mood and affect. His behavior is normal. Judgment and thought content normal.           Assessment and Plan        He is albuterol on and whenever he is in no heat

## 2012-05-12 NOTE — Patient Instructions (Addendum)
You are doing well. Start DUONEB nebulizer every 6 hours as needed for wheezing  Please call us if you have new issues that need to be addressed before your next appt.  Your physician wants you to follow-up in: 6 months.  You will receive a reminder letter in the mail two months in advance. If you don't receive a letter, please call our office to schedule the follow-up appointment.

## 2012-06-08 DIAGNOSIS — H3520 Other non-diabetic proliferative retinopathy, unspecified eye: Secondary | ICD-10-CM | POA: Diagnosis not present

## 2012-06-09 ENCOUNTER — Ambulatory Visit (INDEPENDENT_AMBULATORY_CARE_PROVIDER_SITE_OTHER): Payer: Medicare Other

## 2012-06-09 DIAGNOSIS — I059 Rheumatic mitral valve disease, unspecified: Secondary | ICD-10-CM | POA: Diagnosis not present

## 2012-06-09 DIAGNOSIS — Z7901 Long term (current) use of anticoagulants: Secondary | ICD-10-CM

## 2012-06-09 DIAGNOSIS — Z9889 Other specified postprocedural states: Secondary | ICD-10-CM

## 2012-06-09 DIAGNOSIS — I4891 Unspecified atrial fibrillation: Secondary | ICD-10-CM | POA: Diagnosis not present

## 2012-06-09 LAB — POCT INR: INR: 2.8

## 2012-06-09 MED ORDER — ENOXAPARIN SODIUM 80 MG/0.8ML ~~LOC~~ SOLN: 80.0000 mg | Freq: Two times a day (BID) | SUBCUTANEOUS | Status: DC

## 2012-06-09 NOTE — Patient Instructions (Addendum)
Take your last dosage of Coumadin on 06/23/12. 06/24/12 No Coumadin, No Lovenox . 06/25/12 Start Lovenox 80mg  injection in am, Lovenox 80mg  in the pm. 06/26/12 Lovenox 80mg  in am, Lovenox 80mg  in pm. 06/27/12 Lovenox 80mg  in am, Lovenox 80mg  in pm. 06/28/12 Take your last dosage of Lovenox 80mg  in am prior to procedure on 06/29/12.  Resume Lovenox 80mg  twice daily once ok by your GI MD.  Restart Coumadin once ok by GI MD to do so, take 2 tablet x 3 days, then resume same dosage 1 tablet daily except 2 tablets on Wednesdays and Saturdays.  Continue on Lovenox and Coumadin until INR is greater than 2.5.

## 2012-06-15 ENCOUNTER — Encounter (INDEPENDENT_AMBULATORY_CARE_PROVIDER_SITE_OTHER): Payer: Medicare Other | Admitting: General Surgery

## 2012-06-17 DIAGNOSIS — N139 Obstructive and reflux uropathy, unspecified: Secondary | ICD-10-CM | POA: Diagnosis not present

## 2012-06-17 DIAGNOSIS — R339 Retention of urine, unspecified: Secondary | ICD-10-CM | POA: Diagnosis not present

## 2012-06-17 DIAGNOSIS — IMO0002 Reserved for concepts with insufficient information to code with codable children: Secondary | ICD-10-CM | POA: Diagnosis not present

## 2012-06-17 DIAGNOSIS — N318 Other neuromuscular dysfunction of bladder: Secondary | ICD-10-CM | POA: Diagnosis not present

## 2012-06-29 ENCOUNTER — Encounter: Payer: Self-pay | Admitting: Internal Medicine

## 2012-06-29 ENCOUNTER — Ambulatory Visit (AMBULATORY_SURGERY_CENTER): Payer: Medicare Other | Admitting: Internal Medicine

## 2012-06-29 VITALS — BP 141/72 | HR 70 | Temp 97.1°F | Resp 18 | Ht 67.0 in | Wt 164.0 lb

## 2012-06-29 DIAGNOSIS — Z860100 Personal history of colon polyps, unspecified: Secondary | ICD-10-CM

## 2012-06-29 DIAGNOSIS — Z1211 Encounter for screening for malignant neoplasm of colon: Secondary | ICD-10-CM | POA: Diagnosis not present

## 2012-06-29 DIAGNOSIS — D126 Benign neoplasm of colon, unspecified: Secondary | ICD-10-CM

## 2012-06-29 DIAGNOSIS — Z8719 Personal history of other diseases of the digestive system: Secondary | ICD-10-CM

## 2012-06-29 DIAGNOSIS — K635 Polyp of colon: Secondary | ICD-10-CM

## 2012-06-29 DIAGNOSIS — F411 Generalized anxiety disorder: Secondary | ICD-10-CM | POA: Diagnosis not present

## 2012-06-29 LAB — GLUCOSE, CAPILLARY: Glucose-Capillary: 123 mg/dL — ABNORMAL HIGH (ref 70–99)

## 2012-06-29 MED ORDER — SODIUM CHLORIDE 0.9 % IV SOLN: 500.0000 mL | INTRAVENOUS | Status: DC

## 2012-06-29 NOTE — Op Note (Signed)
[facility] [address] [place], [place]  [address]  COLONOSCOPY PROCEDURE REPORT  PATIENT:  [name], [name]  MR#:  [id number] BIRTHDATE:  1941-05-08, 71 yrs. old  GENDER:  male ENDOSCOPIST:  Iva Boop, MD, [facility]  PROCEDURE DATE:  06/29/2012 PROCEDURE:  Colonoscopy with snare polypectomy, Colon through ostomy ASA CLASS:  Class III INDICATIONS:  surveillance and high-risk screening, history of pre-cancerous (adenomatous) colon polyps 4 adenomas 2009 also waiting for colostomy takedown and re-anastomosis after perforated diverticulitis. MEDICATIONS:   MAC sedation, administered by CRNA, propofol (Diprivan) 80 mg IV  DESCRIPTION OF PROCEDURE:   After the risks benefits and alternatives of the procedure were thoroughly explained, informed consent was obtained.  Digital rectal exam was performed and revealed no rectal masses.   The LB [id number] endoscope was introduced through the colostomy and advanced to the cecum, which was identified by both the appendix and ileocecal valve, without limitations.  The quality of the prep was excellent, using MoviPrep.  The instrument was then slowly withdrawn as the colon was fully examined. After this, the rectosigmoid stump was examined by inserting the scope through the anus. 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 The views were limited by retained mucous though do not think lesions > 1 cm missed. <<PROCEDUREIMAGES>>  FINDINGS:  The patient was status-post colostomy and Hartmann's pouch. Four polyps were found in the transverse colon. Largest was 7 mm. Smallest 3 mm. All removed with cold snare, 3 mm polyp not recovered, others sent to pathology.  The remained of the colon inspected via ostomy was normal. Inspection of the rectosigmoid pouch revealed retained mucous in some areas but was otherwise normal. The colonoscope insertion time total was 10 minutes and overall withdrawl time was  6 minutes. COMPLICATIONS:   None ENDOSCOPIC IMPRESSION: 1) Four polyps in the transverse colon, largest 7mm - removed but smallest (3mm) not recovered. 2) Rectum and sigmoid views limited some by retained mucous 3) Otherwise normal exam, excellent prep in colon except as noted 4) Personal hx 4 adenomas 2009 RECOMMENDATIONS: 1) resume Lovenox and warfarin today 2) Go to anti-coagulation clinic 7/12 REPEAT EXAM:  In for Colonoscopy, pending biopsy results. will need sigmoidoscopy exam December 2013  Iva Boop, MD, [facility]  CC:  Avel Peace, Edwyna Ready, MDThe Patient  n. Rosalie Doctor:   Iva Boop at 06/29/2012 10:03 AM  Mickle Plumb, [phone]

## 2012-06-29 NOTE — Progress Notes (Signed)
Patient did not experience any of the following events: a burn prior to discharge; a fall within the facility; wrong site/side/patient/procedure/implant event; or a hospital transfer or hospital admission upon discharge from the facility. (G8907) Patient did not have preoperative order for IV antibiotic SSI prophylaxis. (G8918)  

## 2012-06-29 NOTE — Patient Instructions (Addendum)
Four polyps were removed. Everything else looked ok. I will send a letter about the polyps and recommended follow-up. Resume Lovenox today. Resume Coumadin (Warfarin) today/tonight. Go to cardiology office for blood recheck 7/12 as planned.  You will need a sigmoidoscopy exam (short colon exam) a few months after your colon reconnection surgery as it was difficult to see well in parts of that area due to retained mucous.   Thanks for choosing [facility].  Iva Boop, MD, FACG YOU HAD AN ENDOSCOPIC PROCEDURE TODAY AT THE  ENDOSCOPY CENTER: Refer to the procedure report that was given to you for any specific questions about what was found during the examination.  If the procedure report does not answer your questions, please call your gastroenterologist to clarify.  If you requested that your care partner not be given the details of your procedure findings, then the procedure report has been included in a sealed envelope for you to review at your convenience later.  YOU SHOULD EXPECT: Some feelings of bloating in the abdomen. Passage of more gas than usual.  Walking can help get rid of the air that was put into your GI tract during the procedure and reduce the bloating. If you had a lower endoscopy (such as a colonoscopy or flexible sigmoidoscopy) you may notice spotting of blood in your stool or on the toilet paper. If you underwent a bowel prep for your procedure, then you may not have a normal bowel movement for a few days.  DIET: Your first meal following the procedure should be a light meal and then it is ok to progress to your normal diet.  A half-sandwich or bowl of soup is an example of a good first meal.  Heavy or fried foods are harder to digest and may make you feel nauseous or bloated.  Likewise meals heavy in dairy and vegetables can cause extra gas to form and this can also increase the bloating.  Drink plenty of fluids but you should avoid alcoholic beverages for 24  hours.  ACTIVITY: Your care partner should take you home directly after the procedure.  You should plan to take it easy, moving slowly for the rest of the day.  You can resume normal activity the day after the procedure however you should NOT DRIVE or use heavy machinery for 24 hours (because of the sedation medicines used during the test).    SYMPTOMS TO REPORT IMMEDIATELY: A gastroenterologist can be reached at any hour.  During normal business hours, 8:30 AM to 5:00 PM Monday through Friday, call [phone].  After hours and on weekends, please call the GI answering service at [phone] who will take a message and have the physician on call contact you.   Following lower endoscopy (colonoscopy or flexible sigmoidoscopy):  Excessive amounts of blood in the stool  Significant tenderness or worsening of abdominal pains  Swelling of the abdomen that is new, acute  Fever of 100F or higher  Following upper endoscopy (EGD)  Vomiting of blood or coffee ground material  New chest pain or pain under the shoulder blades  Painful or persistently difficult swallowing  New shortness of breath  Fever of 100F or higher  Black, tarry-looking stools  FOLLOW UP: If any biopsies were taken you will be contacted by phone or by letter within the next 1-3 weeks.  Call your gastroenterologist if you have not heard about the biopsies in 3 weeks.  Our staff will call the home number listed on your  records the next business day following your procedure to check on you and address any questions or concerns that you may have at that time regarding the information given to you following your procedure. This is a courtesy call and so if there is no answer at the home number and we have not heard from you through the emergency physician on call, we will assume that you have returned to your regular daily activities without incident.  SIGNATURES/CONFIDENTIALITY: You and/or your care partner have signed  paperwork which will be entered into your electronic medical record.  These signatures attest to the fact that that the information above on your After Visit Summary has been reviewed and is understood.  Full responsibility of the confidentiality of this discharge information lies with you and/or your care-partner.

## 2012-06-30 ENCOUNTER — Telehealth: Payer: Self-pay | Admitting: *Deleted

## 2012-06-30 NOTE — Telephone Encounter (Signed)
  Follow up Call-  Call back number 06/29/2012  Post procedure Call Back phone  # [phone]  Permission to leave phone message Yes     Patient questions:  Do you have a fever, pain , or abdominal swelling? no Pain Score  0 *  Have you tolerated food without any problems? yes  Have you been able to return to your normal activities? yes  Do you have any questions about your discharge instructions: Diet   no Medications  no Follow up visit  no  Do you have questions or concerns about your Care? no  Actions: * If pain score is 4 or above: No action needed, pain <4.

## 2012-07-02 ENCOUNTER — Ambulatory Visit (INDEPENDENT_AMBULATORY_CARE_PROVIDER_SITE_OTHER): Payer: Medicare Other

## 2012-07-02 ENCOUNTER — Other Ambulatory Visit: Payer: Self-pay

## 2012-07-02 DIAGNOSIS — Z7901 Long term (current) use of anticoagulants: Secondary | ICD-10-CM

## 2012-07-02 DIAGNOSIS — Z9889 Other specified postprocedural states: Secondary | ICD-10-CM | POA: Diagnosis not present

## 2012-07-02 DIAGNOSIS — I059 Rheumatic mitral valve disease, unspecified: Secondary | ICD-10-CM | POA: Diagnosis not present

## 2012-07-02 DIAGNOSIS — I4891 Unspecified atrial fibrillation: Secondary | ICD-10-CM | POA: Diagnosis not present

## 2012-07-02 MED ORDER — ENOXAPARIN SODIUM 80 MG/0.8ML ~~LOC~~ SOLN: 80.0000 mg | Freq: Two times a day (BID) | SUBCUTANEOUS | Status: DC

## 2012-07-05 ENCOUNTER — Ambulatory Visit (INDEPENDENT_AMBULATORY_CARE_PROVIDER_SITE_OTHER): Payer: Medicare Other | Admitting: General Surgery

## 2012-07-05 ENCOUNTER — Encounter (INDEPENDENT_AMBULATORY_CARE_PROVIDER_SITE_OTHER): Payer: Self-pay | Admitting: General Surgery

## 2012-07-05 ENCOUNTER — Ambulatory Visit (INDEPENDENT_AMBULATORY_CARE_PROVIDER_SITE_OTHER): Payer: Medicare Other

## 2012-07-05 VITALS — BP 124/76 | HR 80 | Temp 97.4°F | Resp 14 | Ht 66.0 in | Wt 165.4 lb

## 2012-07-05 DIAGNOSIS — I059 Rheumatic mitral valve disease, unspecified: Secondary | ICD-10-CM | POA: Diagnosis not present

## 2012-07-05 DIAGNOSIS — Z7901 Long term (current) use of anticoagulants: Secondary | ICD-10-CM

## 2012-07-05 DIAGNOSIS — Z9889 Other specified postprocedural states: Secondary | ICD-10-CM | POA: Diagnosis not present

## 2012-07-05 DIAGNOSIS — Z933 Colostomy status: Secondary | ICD-10-CM

## 2012-07-05 DIAGNOSIS — I4891 Unspecified atrial fibrillation: Secondary | ICD-10-CM

## 2012-07-05 NOTE — Patient Instructions (Signed)
We will see you back in 2 months

## 2012-07-05 NOTE — Progress Notes (Signed)
Patient ID: [name], male   DOB: 06-06-1941, 71 y.o.   MRN: [id number]  No chief complaint on file.   HPI [name] is a 71 y.o. male.   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 He is back to more and along in doing his normal activities. He's had some issues with his COPD. The colostomy is working well but he is getting tired of it. He has an interest in having it reversed but would like to wait a couple months before he decides that.  He had a recent colonoscopy through the rectal stump and the colostomy. 3 polyps were removed and pathology is pending.  Past Medical History  Diagnosis Date  . Atrial fibrillation     chronic  . Hypertension   . Hyperkalemia   . Acute systolic heart failure   . Coronary artery disease     Cath 2005- patent LIMA-LAD, SVG-OM grafts, moderate MR  . Prostate cancer 04/2009  . Hyperlipidemia   . COPD (chronic obstructive pulmonary disease)     URI 9/12  . DM type 2 (diabetes mellitus, type 2)   . PNA (pneumonia) 03/2010  . Glaucoma   . Hard of hearing   . Myocardial infarction   . CHF (congestive heart failure)   . Gout   . Adenomatous polyps   . Diverticulosis     left side   . H/O mitral valve prolapse   . Sigmoid diverticulitis     perforated    Past Surgical History  Procedure Date  . Coronary artery bypass graft 1989    LIMA-LAD, SVG-OM  . Mitral valve replacement 2007    Mechanical, performed at [facility]  . Hernia repair   . External ear surgery     drum  . Colon resection 01/12/2012    sigmoid colectomy and colostomy for diverticulitis  . Cataract extraction   . Colonoscopy 11/23/2008    Family History  Problem Relation Age of Onset  . Coronary artery disease Mother   . Heart attack Mother     x10  . Heart disease Mother   . Stroke Father   . Heart disease Father   . Heart attack Brother     with bypass   . Heart attack Sister     with bypass   . Cancer Sister     bone  . Pancreatic cancer Brother    pancreatic  . Heart disease Maternal Grandmother   . Heart disease Maternal Grandfather   . Leukemia Sister   . Cancer Maternal Aunt     ? type    Social History History  Substance Use Topics  . Smoking status: Former Smoker -- 4.0 packs/day for 23 years    Types: Cigarettes    Quit date: 12/23/1987  . Smokeless tobacco: Never Used  . Alcohol Use: No    Allergies  Allergen Reactions  . Acetaminophen-Codeine     REACTION: hives  . Altace (Ramipril)   . Codeine     REACTION: hives  . Sulfonamide Derivatives     REACTION: hives    Current Outpatient Prescriptions  Medication Sig Dispense Refill  . acyclovir ointment (ZOVIRAX) 5 % Apply topically every 3 (three) hours.  1 g  1  . albuterol-ipratropium (COMBIVENT) 18-103 MCG/ACT inhaler Inhale 2 puffs into the lungs as needed. For shortness of breath      . budesonide-formoterol (SYMBICORT) 160-4.5 MCG/ACT inhaler Inhale 2 puffs into the lungs 2 (two)  times daily.      . carvedilol (COREG) 12.5 MG tablet Take 1.5 tablets (18.75 mg total) by mouth 2 (two) times daily with a meal.  270 tablet  3  . Cholecalciferol (VITAMIN D3) 1000 UNITS CAPS Take 1 capsule by mouth daily.        Marland Kitchen dextromethorphan-guaiFENesin (MUCINEX DM) 30-600 MG per 12 hr tablet Take 1 tablet by mouth every 12 (twelve) hours.      . digoxin (LANOXIN) 0.125 MG tablet Take 125 mcg by mouth daily.      Marland Kitchen enoxaparin (LOVENOX) 80 MG/0.8ML injection Inject 0.8 mLs (80 mg total) into the skin every 12 (twelve) hours.  10 Syringe  1  . furosemide (LASIX) 20 MG tablet Take 20 mg by mouth daily.      Marland Kitchen ipratropium-albuterol (DUONEB) 0.5-2.5 (3) MG/3ML SOLN Take 3 mLs by nebulization every 6 (six) hours as needed.  360 mL  12  . latanoprost (XALATAN) 0.005 % ophthalmic solution Place 1 drop into both eyes at bedtime.  2.5 mL  1  . losartan (COZAAR) 100 MG tablet Take 0.5 tablets (50 mg total) by mouth daily.  90 tablet  3  . metFORMIN (GLUCOPHAGE) 500 MG tablet Take 500  mg by mouth daily with breakfast.       . nitrofurantoin, macrocrystal-monohydrate, (MACROBID) 100 MG capsule Take 1 capsule twice weekly      . OVER THE COUNTER MEDICATION Take 1 tablet by mouth daily. Preservision Eye Vitamins      . simvastatin (ZOCOR) 40 MG tablet Take 40 mg by mouth at bedtime.       . Tamsulosin HCl (FLOMAX) 0.4 MG CAPS Take 0.4 mg by mouth daily.       Marland Kitchen warfarin (COUMADIN) 2.5 MG tablet Take 2.5 mg by mouth daily. Take 2.5 mg on Mon, Tues, Thurs,Fri, and Sun. Take 5 mg on Wed and Saturday.        Review of Systems Review of Systems  Constitutional: Negative for appetite change and unexpected weight change.    Blood pressure 124/76, pulse 80, temperature 97.4 F (36.3 C), resp. rate 14, height 5\' 6"  (1.676 m), weight 165 lb 6.4 oz (75.025 kg).  Physical Exam Physical Exam  Constitutional: He appears well-developed and well-nourished. No distress.  Cardiovascular:       Irregular rate and rhythm with crisp valve click.  Abdominal: Soft. He exhibits no distension and no mass. There is no tenderness.       Midline scar. Left sided colostomy.    Data Reviewed Previous notes.  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  His significant comorbidities increase his perioperative risk should he choose to undergo the colostomy closure.  He is not ready to make that decision yet.    Plan    I will see him back in 2 months. If he decides that he would like to have colostomy closure we'll get medical and cardiac evaluation for risk assessment. If it's low risk I think we can proceed. However if his moderate to high risk I think it might be better not to perform a colostomy closure.       Koryn Charlot J 07/05/2012, 4:00 PM

## 2012-07-06 DIAGNOSIS — J449 Chronic obstructive pulmonary disease, unspecified: Secondary | ICD-10-CM | POA: Diagnosis not present

## 2012-07-06 DIAGNOSIS — E119 Type 2 diabetes mellitus without complications: Secondary | ICD-10-CM | POA: Diagnosis not present

## 2012-07-06 DIAGNOSIS — I10 Essential (primary) hypertension: Secondary | ICD-10-CM | POA: Diagnosis not present

## 2012-07-06 DIAGNOSIS — E785 Hyperlipidemia, unspecified: Secondary | ICD-10-CM | POA: Diagnosis not present

## 2012-07-08 ENCOUNTER — Encounter: Payer: Self-pay | Admitting: Internal Medicine

## 2012-07-08 NOTE — Progress Notes (Signed)
Quick Note:  2 adenomas, 1 hyperplastic, 1 not recovered (3mm) Probable repeat colonoscopy 2018 but needs sigmoidoscopy to check bypassed area after surgery - Dec 2013 ______

## 2012-07-19 ENCOUNTER — Ambulatory Visit (INDEPENDENT_AMBULATORY_CARE_PROVIDER_SITE_OTHER): Payer: Medicare Other

## 2012-07-19 DIAGNOSIS — Z7901 Long term (current) use of anticoagulants: Secondary | ICD-10-CM | POA: Diagnosis not present

## 2012-07-19 DIAGNOSIS — Z9889 Other specified postprocedural states: Secondary | ICD-10-CM

## 2012-07-19 DIAGNOSIS — I4891 Unspecified atrial fibrillation: Secondary | ICD-10-CM

## 2012-07-19 DIAGNOSIS — I059 Rheumatic mitral valve disease, unspecified: Secondary | ICD-10-CM | POA: Diagnosis not present

## 2012-08-11 ENCOUNTER — Ambulatory Visit (INDEPENDENT_AMBULATORY_CARE_PROVIDER_SITE_OTHER): Payer: Medicare Other

## 2012-08-11 DIAGNOSIS — Z9889 Other specified postprocedural states: Secondary | ICD-10-CM

## 2012-08-11 DIAGNOSIS — Z7901 Long term (current) use of anticoagulants: Secondary | ICD-10-CM | POA: Diagnosis not present

## 2012-08-11 DIAGNOSIS — I4891 Unspecified atrial fibrillation: Secondary | ICD-10-CM | POA: Diagnosis not present

## 2012-08-11 DIAGNOSIS — I059 Rheumatic mitral valve disease, unspecified: Secondary | ICD-10-CM | POA: Diagnosis not present

## 2012-09-01 ENCOUNTER — Ambulatory Visit (INDEPENDENT_AMBULATORY_CARE_PROVIDER_SITE_OTHER): Payer: Medicare Other | Admitting: General Surgery

## 2012-09-01 ENCOUNTER — Encounter (INDEPENDENT_AMBULATORY_CARE_PROVIDER_SITE_OTHER): Payer: Self-pay

## 2012-09-01 ENCOUNTER — Encounter (INDEPENDENT_AMBULATORY_CARE_PROVIDER_SITE_OTHER): Payer: Self-pay | Admitting: General Surgery

## 2012-09-01 VITALS — BP 128/76 | HR 68 | Temp 97.1°F | Resp 16 | Ht 66.0 in | Wt 164.4 lb

## 2012-09-01 DIAGNOSIS — Z933 Colostomy status: Secondary | ICD-10-CM

## 2012-09-01 NOTE — Patient Instructions (Signed)
We will call you after we receive the results of your Cardiology and Medical evaluations.

## 2012-09-01 NOTE — Progress Notes (Signed)
Subjective:     Patient ID: [name], male   DOB: 22-Aug-1941, 71 y.o.   MRN: [id number]  HPI  He is here for a follow up visit of his colostomy status. He had a colonoscopy through the            colostomy which demonstrated multiple adenomatous polyps with no dysplasia.  His energy level is good. He is interested in having the colostomy reversed.   Review of Systems He does get short of breath after going up a flight of stairs at moderate to fast pace.     Objective:   Physical Exam Gen.-he looks well and is in no acute distress.  Abdomen-soft, midline scar present, left upper quadrant colostomy without evidence of hernia.    Assessment:     Colostomy status. He is interested in reversal. However, he has a number of significant comorbidities which I am concerned about and I discussed this with him and his wife.    Plan:     We will arrange for a cardiac evaluation and a medical evaluation (for his COPD). If he is considered low risk, I would consider the operation. If not, I would not recommend he have the colostomy reversal.

## 2012-09-03 ENCOUNTER — Telehealth: Payer: Self-pay | Admitting: Cardiovascular Disease

## 2012-09-03 ENCOUNTER — Telehealth (INDEPENDENT_AMBULATORY_CARE_PROVIDER_SITE_OTHER): Payer: Self-pay

## 2012-09-03 DIAGNOSIS — Z79899 Other long term (current) drug therapy: Secondary | ICD-10-CM | POA: Diagnosis not present

## 2012-09-03 NOTE — Telephone Encounter (Signed)
Jamie at [place] will pass msg to triage regarding the patient's cardiac clearance - possible appt with Dr. Mariah Milling.

## 2012-09-03 NOTE — Telephone Encounter (Signed)
Message copied by Marcelle Overlie on Fri Sep 03, 2012 11:36 AM ------      Message from: Thersa Salt      Created: Fri Sep 03, 2012 10:32 AM      Regarding: RE: appt       Will call pt to set up      ----- Message -----         From: Marcelle Overlie, RN         Sent: 09/01/2012   4:43 PM           To: Angelina Sheriff Little      Subject: appt                                                     Hey! Pt needs appt for pre op clearance. His 6 month appt is due November. Can we see about getting him in in sept/october for [facility]; future colostomy reversal?      Thanks!

## 2012-09-03 NOTE — Telephone Encounter (Signed)
Pt needs surgical clearance. Colostomy reversal pending clearance. Pt is high risk [phone] fax [phone].

## 2012-09-03 NOTE — Telephone Encounter (Signed)
Please schedule pt for appt next week for clearance thanks

## 2012-09-03 NOTE — Telephone Encounter (Signed)
Pt has appt for cardiac consult with Dr. Mariah Milling on 09/14/12 at 9:00 a.m.

## 2012-09-08 ENCOUNTER — Ambulatory Visit (INDEPENDENT_AMBULATORY_CARE_PROVIDER_SITE_OTHER): Payer: Medicare Other

## 2012-09-08 DIAGNOSIS — I059 Rheumatic mitral valve disease, unspecified: Secondary | ICD-10-CM

## 2012-09-08 DIAGNOSIS — Z7901 Long term (current) use of anticoagulants: Secondary | ICD-10-CM

## 2012-09-08 DIAGNOSIS — I4891 Unspecified atrial fibrillation: Secondary | ICD-10-CM | POA: Diagnosis not present

## 2012-09-08 DIAGNOSIS — Z9889 Other specified postprocedural states: Secondary | ICD-10-CM

## 2012-09-08 LAB — POCT INR: INR: 3.4

## 2012-09-14 ENCOUNTER — Encounter: Payer: Self-pay | Admitting: Cardiovascular Disease

## 2012-09-14 ENCOUNTER — Ambulatory Visit (INDEPENDENT_AMBULATORY_CARE_PROVIDER_SITE_OTHER): Payer: Medicare Other | Admitting: Cardiovascular Disease

## 2012-09-14 VITALS — BP 124/68 | HR 60 | Ht 66.0 in | Wt 168.5 lb

## 2012-09-14 DIAGNOSIS — E785 Hyperlipidemia, unspecified: Secondary | ICD-10-CM

## 2012-09-14 DIAGNOSIS — J449 Chronic obstructive pulmonary disease, unspecified: Secondary | ICD-10-CM

## 2012-09-14 DIAGNOSIS — I4891 Unspecified atrial fibrillation: Secondary | ICD-10-CM

## 2012-09-14 DIAGNOSIS — I25810 Atherosclerosis of coronary artery bypass graft(s) without angina pectoris: Secondary | ICD-10-CM

## 2012-09-14 DIAGNOSIS — I10 Essential (primary) hypertension: Secondary | ICD-10-CM

## 2012-09-14 DIAGNOSIS — Z9889 Other specified postprocedural states: Secondary | ICD-10-CM

## 2012-09-14 DIAGNOSIS — Z01810 Encounter for preprocedural cardiovascular examination: Secondary | ICD-10-CM

## 2012-09-14 DIAGNOSIS — E119 Type 2 diabetes mellitus without complications: Secondary | ICD-10-CM

## 2012-09-14 NOTE — Assessment & Plan Note (Signed)
Cholesterol is at goal on the current lipid regimen. No changes to the medications were made.  

## 2012-09-14 NOTE — Assessment & Plan Note (Signed)
Currently with no symptoms of angina. No further workup at this time. Continue current medication regimen. 

## 2012-09-14 NOTE — Assessment & Plan Note (Signed)
He would be acceptable risk for ostomy reversal. No further workup needed at this time. Would suggest minimizing IV fluids in the perioperative period to avoid any heart failure.

## 2012-09-14 NOTE — Assessment & Plan Note (Signed)
We have encouraged continued exercise, careful diet management in an effort to lose weight. 

## 2012-09-14 NOTE — Patient Instructions (Addendum)
You are doing well. No medication changes were made.  Please call us if you have new issues that need to be addressed before your next appt.  Your physician wants you to follow-up in: 6 months.  You will receive a reminder letter in the mail two months in advance. If you don't receive a letter, please call our office to schedule the follow-up appointment.   

## 2012-09-14 NOTE — Assessment & Plan Note (Signed)
Currently no acute exacerbations. He has done well for the past 6 months. We will refer him to pulmonary to establish care.

## 2012-09-14 NOTE — Assessment & Plan Note (Signed)
Recent echocardiogram showing well seated valve. Repeat echocardiogram in 2014.

## 2012-09-14 NOTE — Assessment & Plan Note (Signed)
Chronic atrial fibrillation, rate is well-controlled. He will need a Lovenox bridge prior to surgery.

## 2012-09-14 NOTE — Progress Notes (Signed)
Patient ID: [name], male    DOB: April 12, 1941, 71 y.o.   MRN: [id number]  HPI Comments: [name] is a very pleasant 71 year old gentleman with a past medical history of coronary artery disease, bypass surgery with a  left internal mammary to the LAD and saphenous vein graft to the OM in 1989, PCI of the RCA in 2002, COPD, mitral valve replacement with prosthetic valve on Coumadin, history of chronic atrial fibrillation, diabetes, hypertension, hyperlipidemia, PNA in 03/2010, who presents for routine followup.    admitted to the hospital from September 04 2011 with atrial fibrillation with RVR, chest pain, bronchitis, COPD exacerbation. He was treated with steroids, antibiotics, rate control for his atrial fibrillation with improvement of his symptoms.   He was admitted again to the hospital January 15 with shortness of breath and congestion felt to have COPD exacerbation, started on her buttocks, nebulizers, steroids. He suffered a perforated bowel with air under his diaphragm and was transferred to [facility] and within 48 hours, had surgery performed. He presents today with an ostomy. 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 He had a 24 pound weight loss through this procedure, open abdominal wall wound healing by secondary intention.  He was discharged with underlying anemia, hematocrit 27 INR of 4  Overall he reports that he has been doing well since his last clinic visit. He is considering ostomy repair to be performed by [name] in [place]. He denies any significant shortness of breath or chest pain. He is able to exert himself into a reasonable amount of activity. He denies any recent exacerbation of his breathing.    last stress test in 2009, that showed ejection fraction 43%, inferior wall infarct with no ischemia. MV was functioning well.    echocardiogram  confirms prosthetic mitral  valve is working well with no significant regurgitation, ejection fraction 40-45% with inferior hypokinesis.   EKG shows atrial fibrillation with rate 60 beats per minute,Right bundle branch block, left anterior fascicular block,  no significant ST or T wave changes     Outpatient Encounter Prescriptions as of 09/14/2012  Medication Sig Dispense Refill  . albuterol-ipratropium (COMBIVENT) 18-103 MCG/ACT inhaler Inhale 2 puffs into the lungs as needed. For shortness of breath      . budesonide-formoterol (SYMBICORT) 160-4.5 MCG/ACT inhaler Inhale 2 puffs into the lungs 2 (two) times daily.      . carvedilol (COREG) 12.5 MG tablet Take 1.5 tablets (18.75 mg total) by mouth 2 (two) times daily with a meal.  270 tablet  3  . Cholecalciferol (VITAMIN D3) 1000 UNITS CAPS Take 1 capsule by mouth daily.        Marland Kitchen dextromethorphan-guaiFENesin (MUCINEX DM) 30-600 MG per 12 hr tablet Take 1 tablet by mouth every 12 (twelve) hours.      . digoxin (LANOXIN) 0.125 MG tablet Take 125 mcg by mouth daily.      . furosemide (LASIX) 20 MG tablet Take 20 mg by mouth daily.      Marland Kitchen ipratropium-albuterol (DUONEB) 0.5-2.5 (3) MG/3ML SOLN Take 3 mLs by nebulization every 6 (six) hours as needed.  360 mL  12  . latanoprost (XALATAN) 0.005 % ophthalmic solution Place 1 drop into both eyes at bedtime.  2.5 mL  1  . losartan (COZAAR) 100 MG tablet Take 0.5 tablets (50 mg total) by mouth daily.  90 tablet  3  . metFORMIN (GLUCOPHAGE) 500 MG  tablet Take 500 mg by mouth daily with breakfast.       . OVER THE COUNTER MEDICATION Take 1 tablet by mouth daily. Preservision Eye Vitamins      . simvastatin (ZOCOR) 40 MG tablet Take 40 mg by mouth at bedtime.       . Tamsulosin HCl (FLOMAX) 0.4 MG CAPS Take 0.4 mg by mouth daily.       Marland Kitchen warfarin (COUMADIN) 2.5 MG tablet Take 2.5 mg by mouth daily. Take 2.5 mg on Mon, Tues, Thurs,Fri, and Sun. Take 5 mg on Wed and Saturday.       Review of Systems  Constitutional: Negative.     HENT: Negative.        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   Respiratory: Positive for shortness of breath.   Cardiovascular: Negative.   Gastrointestinal: Negative.   Musculoskeletal: Negative.   Skin: Negative.   Neurological: Negative.   Hematological: Negative.   Psychiatric/Behavioral: Negative.   All other systems reviewed and are negative.   BP 124/68  Pulse 60  Ht 5\' 6"  (1.676 m)  Wt 168 lb 8 oz (76.431 kg)  BMI 27.20 kg/m2  Physical Exam  Nursing note and vitals reviewed. Constitutional: He is oriented to person, place, and time. He appears well-developed and well-nourished.  HENT:  Head: Normocephalic.  Nose: Nose normal.  Mouth/Throat: Oropharynx is clear and moist.  Eyes: Conjunctivae normal are normal. Pupils are equal, round, and reactive to light.  Neck: Normal range of motion. Neck supple. No JVD present.  Cardiovascular: Normal rate, regular rhythm, S1 normal, S2 normal and intact distal pulses.  Exam reveals no gallop and no friction rub.   Murmur heard.  Crescendo systolic murmur is present with a grade of 2/6  Pulmonary/Chest: Effort normal. No respiratory distress. He has decreased breath sounds. He has no wheezes. He has no rales. He exhibits no tenderness.  Abdominal: Soft. Bowel sounds are normal. He exhibits no distension. There is no tenderness.       Ostomy in place  Musculoskeletal: Normal range of motion. He exhibits no edema and no tenderness.  Lymphadenopathy:    He has no cervical adenopathy.  Neurological: He is alert and oriented to person, place, and time. Coordination normal.  Skin: Skin is warm and dry. No rash noted. No erythema.  Psychiatric: He has a normal mood and affect. His behavior is normal. Judgment and thought content normal.           Assessment and Plan

## 2012-09-25 DIAGNOSIS — R05 Cough: Secondary | ICD-10-CM | POA: Diagnosis not present

## 2012-09-25 DIAGNOSIS — J209 Acute bronchitis, unspecified: Secondary | ICD-10-CM | POA: Diagnosis not present

## 2012-09-30 ENCOUNTER — Encounter (INDEPENDENT_AMBULATORY_CARE_PROVIDER_SITE_OTHER): Payer: Self-pay

## 2012-10-01 ENCOUNTER — Encounter (INDEPENDENT_AMBULATORY_CARE_PROVIDER_SITE_OTHER): Payer: Self-pay | Admitting: General Surgery

## 2012-10-01 ENCOUNTER — Ambulatory Visit (INDEPENDENT_AMBULATORY_CARE_PROVIDER_SITE_OTHER): Payer: Medicare Other | Admitting: General Surgery

## 2012-10-01 VITALS — BP 130/62 | HR 76 | Temp 97.2°F | Resp 20 | Ht 67.0 in | Wt 169.0 lb

## 2012-10-01 DIAGNOSIS — Z933 Colostomy status: Secondary | ICD-10-CM

## 2012-10-01 NOTE — Progress Notes (Signed)
Patient ID: [name], male   DOB: January 17, 1941, 71 y.o.   MRN: [id number]  Chief Complaint  Patient presents with  . Pre-op Exam    consult    HPI [name] is a 71 y.o. male.   HPI  [name] is here to discuss colostomy closure. From my overall medical and cardiology standpoint there was no significant contraindication to him having the operation.  At this time, he is happy with his lifestyle and is able to do basically whatever he wants to do.  His wife, son, and daughter do not want him to have the surgery as they remember how difficult it was for him to get over the first operation.  Past Medical History  Diagnosis Date  . Atrial fibrillation     chronic  . Hypertension   . Hyperkalemia   . Acute systolic heart failure   . Coronary artery disease     Cath 2005- patent LIMA-LAD, SVG-OM grafts, moderate MR  . Prostate cancer 04/2009  . Hyperlipidemia   . COPD (chronic obstructive pulmonary disease)     URI 9/12  . DM type 2 (diabetes mellitus, type 2)   . PNA (pneumonia) 03/2010  . Glaucoma(365)   . Hard of hearing   . Myocardial infarction   . CHF (congestive heart failure)   . Gout   . Adenomatous polyps   . Diverticulosis     left side   . H/O mitral valve prolapse   . Sigmoid diverticulitis     perforated    Past Surgical History  Procedure Date  . Coronary artery bypass graft 1989    LIMA-LAD, SVG-OM  . Mitral valve replacement 2007    Mechanical, performed at [facility]  . Hernia repair   . External ear surgery     drum  . Colon resection 01/12/2012    sigmoid colectomy and colostomy for diverticulitis  . Cataract extraction   . Colonoscopy 11/23/2008    Family History  Problem Relation Age of Onset  . Coronary artery disease Mother   . Heart attack Mother     x10  . Heart disease Mother   . Stroke Father   . Heart disease Father   . Heart attack Brother     with bypass   . Heart attack Sister     with bypass   . Cancer Sister     bone  .  Pancreatic cancer Brother     pancreatic  . Heart disease Maternal Grandmother   . Heart disease Maternal Grandfather   . Leukemia Sister   . Cancer Maternal Aunt     ? type    Social History History  Substance Use Topics  . Smoking status: Former Smoker -- 4.0 packs/day for 23 years    Types: Cigarettes    Quit date: 12/23/1987  . Smokeless tobacco: Never Used  . Alcohol Use: No    Allergies  Allergen Reactions  . Acetaminophen-Codeine     REACTION: hives  . Altace (Ramipril)   . Codeine     REACTION: hives  . Sulfonamide Derivatives     REACTION: hives    Current Outpatient Prescriptions  Medication Sig Dispense Refill  . albuterol-ipratropium (COMBIVENT) 18-103 MCG/ACT inhaler Inhale 2 puffs into the lungs as needed. For shortness of breath      . budesonide-formoterol (SYMBICORT) 160-4.5 MCG/ACT inhaler Inhale 2 puffs into the lungs 2 (two) times daily.      . carvedilol (COREG) 12.5 MG  tablet Take 1.5 tablets (18.75 mg total) by mouth 2 (two) times daily with a meal.  270 tablet  3  . Cholecalciferol (VITAMIN D3) 1000 UNITS CAPS Take 1 capsule by mouth daily.        [name] Kitchen dextromethorphan-guaiFENesin (MUCINEX DM) 30-600 MG per 12 hr tablet Take 1 tablet by mouth every 12 (twelve) hours.      . digoxin (LANOXIN) 0.125 MG tablet Take 125 mcg by mouth daily.      . furosemide (LASIX) 20 MG tablet Take 20 mg by mouth daily.      [name] Kitchen ipratropium-albuterol (DUONEB) 0.5-2.5 (3) MG/3ML SOLN Take 3 mLs by nebulization every 6 (six) hours as needed.  360 mL  12  . latanoprost (XALATAN) 0.005 % ophthalmic solution Place 1 drop into both eyes at bedtime.  2.5 mL  1  . losartan (COZAAR) 100 MG tablet Take 0.5 tablets (50 mg total) by mouth daily.  90 tablet  3  . metFORMIN (GLUCOPHAGE) 500 MG tablet Take 500 mg by mouth daily with breakfast.       . OVER THE COUNTER MEDICATION Take 1 tablet by mouth daily. Preservision Eye Vitamins      . simvastatin (ZOCOR) 40 MG tablet Take 40 mg by  mouth at bedtime.       . Tamsulosin HCl (FLOMAX) 0.4 MG CAPS Take 0.4 mg by mouth daily.       [name] Kitchen warfarin (COUMADIN) 2.5 MG tablet Take 2.5 mg by mouth daily. Take 2.5 mg on Mon, Tues, Thurs,Fri, and Sun. Take 5 mg on Wed and Saturday.        Review of Systems Review of Systems  Constitutional: Negative.   Gastrointestinal: Negative.     Blood pressure 130/62, pulse 76, temperature 97.2 F (36.2 C), temperature source Temporal, resp. rate 20, height 5\' 7"  (1.702 m), weight 169 lb (76.658 kg).  Physical Exam Physical Exam  Constitutional: He appears well-developed and well-nourished. No distress.  HENT:  Head: Normocephalic and atraumatic.  Cardiovascular: Normal rate.        Irregular rhythm  Pulmonary/Chest:       Breath sounds are equal, distant, and clear  Abdominal: Soft. He exhibits no mass. There is no tenderness.       Left upper quadrant colostomy is working well. No hernia around it. Midline scar without evidence of hernia. Small palpable suture in the subcutaneous tissue in the scar.    Data Reviewed EPIC notes  Assessment    Colostomy status. He also has significant comorbidities. I had a very long discussion with him and his wife. The colostomy closure would be much more complicated than the initial surgery and take much longer. I went over the procedure and risks in detail with them. In my opinion, with all of his comorbidities, the risks outweigh the benefits and I could not guarantee him that he would have as good of a lifestyle after the surgery as he has now.I did not suggest that he have the colostomy closure.    Plan    He accepts my opinion for now. I told him that if he would like her referral to one of the medical centers and talk with a colorectal specialist I would be happy to do that in the future.       Seraphina Mitchner J 10/01/2012, 12:21 PM

## 2012-10-01 NOTE — Patient Instructions (Signed)
Try to avoid repetitive heavy lifting

## 2012-10-06 ENCOUNTER — Ambulatory Visit (INDEPENDENT_AMBULATORY_CARE_PROVIDER_SITE_OTHER): Payer: Medicare Other

## 2012-10-06 DIAGNOSIS — N318 Other neuromuscular dysfunction of bladder: Secondary | ICD-10-CM | POA: Diagnosis not present

## 2012-10-06 DIAGNOSIS — R339 Retention of urine, unspecified: Secondary | ICD-10-CM | POA: Diagnosis not present

## 2012-10-06 DIAGNOSIS — N401 Enlarged prostate with lower urinary tract symptoms: Secondary | ICD-10-CM | POA: Diagnosis not present

## 2012-10-06 DIAGNOSIS — IMO0002 Reserved for concepts with insufficient information to code with codable children: Secondary | ICD-10-CM | POA: Diagnosis not present

## 2012-10-06 DIAGNOSIS — Z9889 Other specified postprocedural states: Secondary | ICD-10-CM | POA: Diagnosis not present

## 2012-10-06 DIAGNOSIS — Z7901 Long term (current) use of anticoagulants: Secondary | ICD-10-CM | POA: Diagnosis not present

## 2012-10-06 DIAGNOSIS — I059 Rheumatic mitral valve disease, unspecified: Secondary | ICD-10-CM

## 2012-10-06 DIAGNOSIS — I4891 Unspecified atrial fibrillation: Secondary | ICD-10-CM | POA: Diagnosis not present

## 2012-10-06 LAB — POCT INR: INR: 2.9

## 2012-10-15 ENCOUNTER — Telehealth: Payer: Self-pay | Admitting: Cardiovascular Disease

## 2012-10-15 NOTE — Telephone Encounter (Signed)
Pt states he is returning nurse call

## 2012-10-16 DIAGNOSIS — J069 Acute upper respiratory infection, unspecified: Secondary | ICD-10-CM | POA: Diagnosis not present

## 2012-10-18 ENCOUNTER — Telehealth: Payer: Self-pay | Admitting: Cardiovascular Disease

## 2012-10-18 NOTE — Telephone Encounter (Signed)
I explained to pt I am unsure as to who called him and why. He confirms appt with Dr. Mariah Milling in march but says he needs to be seen sooner.  He says he is having worsening DOE over the last 2 weeks. He says he has stopped doing any extra activity at all d/t dyspnea. He denies CP. He compares this to how he felt prior to valve replacement. He has not had an echo since 2012 I offered him an appt with Dr. Kirke Corin this week since Dr. Mariah Milling is out of town.  Pt refused says he would rather see Dr. Mariah Milling. Appt made with Dr. Mariah Milling Next Tuesday 10/26/12 at 0915. He will call us should he feel he needs to be seen sooner.

## 2012-10-18 NOTE — Telephone Encounter (Signed)
Pt calling again states nurse called him.

## 2012-10-18 NOTE — Telephone Encounter (Signed)
Pt calling states that nurse called him not sure what it was for or who it was.

## 2012-10-25 ENCOUNTER — Ambulatory Visit (INDEPENDENT_AMBULATORY_CARE_PROVIDER_SITE_OTHER): Payer: Medicare Other | Admitting: Pulmonary Disease

## 2012-10-25 ENCOUNTER — Ambulatory Visit (INDEPENDENT_AMBULATORY_CARE_PROVIDER_SITE_OTHER)
Admission: RE | Admit: 2012-10-25 | Discharge: 2012-10-25 | Disposition: A | Discharge status: Home / Self Care | Payer: Medicare Other | Source: Ambulatory Visit | Attending: Pulmonary Disease | Admitting: Pulmonary Disease

## 2012-10-25 ENCOUNTER — Encounter: Payer: Self-pay | Admitting: Pulmonary Disease

## 2012-10-25 VITALS — BP 120/68 | HR 60 | Temp 97.5°F | Ht 66.0 in | Wt 169.0 lb

## 2012-10-25 DIAGNOSIS — R0600 Dyspnea, unspecified: Secondary | ICD-10-CM

## 2012-10-25 DIAGNOSIS — R0609 Other forms of dyspnea: Secondary | ICD-10-CM

## 2012-10-25 DIAGNOSIS — Z23 Encounter for immunization: Secondary | ICD-10-CM

## 2012-10-25 DIAGNOSIS — J449 Chronic obstructive pulmonary disease, unspecified: Secondary | ICD-10-CM

## 2012-10-25 DIAGNOSIS — R0989 Other specified symptoms and signs involving the circulatory and respiratory systems: Secondary | ICD-10-CM

## 2012-10-25 MED ORDER — ALBUTEROL SULFATE HFA 108 (90 BASE) MCG/ACT IN AERS: 2.0000 | INHALATION_SPRAY | Freq: Four times a day (QID) | RESPIRATORY_TRACT | Status: DC | PRN

## 2012-10-25 MED ORDER — TIOTROPIUM BROMIDE MONOHYDRATE 18 MCG IN CAPS: 18.0000 ug | ORAL_CAPSULE | Freq: Every day | RESPIRATORY_TRACT | Status: DC

## 2012-10-25 NOTE — Patient Instructions (Signed)
Use the symbicort and spiriva as directed. These are not "as needed medications". Exercise regularly, try to make it a goal to exercise 35 minutes daily We will see you back in 3 months or sooner if needed.

## 2012-10-25 NOTE — Assessment & Plan Note (Signed)
COPD: GOLD Grade D (due to multiple exacerbations) Combined recommendations from the [employer]	[place], [employer], Designer, television/film set, European Respiratory Society (Qaseem A et al, Ann Intern Med. 2011;155(3):179) recommends tobacco cessation, pulmonary rehab (for symptomatic patients with an FEV1 < 50% predicted), supplemental oxygen (for patients with SaO2 <88% or paO2 <55), and appropriate bronchodilator therapy.  In regards to long acting bronchodilators, they recommend monotherapy (FEV1 60-80% with symptoms weak evidence, FEV1 with symptoms <60% strong evidence), or combination therapy (FEV1 <60% with symptoms, strong recommendation, moderate evidence).  One should also provide patients with annual immunizations and consider therapy for prevention of COPD exacerbations (ie. roflumilast or azithromycin) when appopriate.  -O2 therapy: Not indicated -Immunizations: Pneumovax UTD, Flu given today -Tobacco use: quit 1989 -Exercise: encouraged regular exercise -Bronchodilator therapy: Add spiriva daily, use symbicort bid, stop ipratropium meds; start albuterol prn; instructed on proper technique -Exacerbation prevention: Biggest area we need to work on; Start with spiriva, proper symbicort use; if no improvement then will need roflumilast -Check CXR

## 2012-10-25 NOTE — Progress Notes (Signed)
Subjective:    Patient ID: [name], male    DOB: 1941/08/28, 71 y.o.   MRN: [id number]  HPI Method [name] is a very pleasant 71 year old male who is referred to [place] by Dr. Mariah [name] for COPD. He had a normal childhood without respiratory illnesses but states that several years ago he started noticing increasing shortness of breath. In the last year he has been admitted to [facility] or [facility] for a COPD exacerbation. He also experienced a perforated colon in early 2013 and had emergency surgery with a colostomy placed. In the last month he says he said increasing shortness of breath, sputum production (green to brown), cough and wheeze. He was seen by the urgent care clinic over at [facility] and was initially given prednisone and then when he came back he about 2 weeks later with worsening symptoms was placed on amoxicillin. Since starting on the antibiotic he says that his symptoms are starting to improve. He now has clear sputum production, shortness of breath house improved, and his wheezing is minimal.  He says that he uses Symbicort on an as-needed basis. Some days he never uses it, other days he uses it up to 4 times a day. He does not notice an immediate effect. He says that Combivent helps.  He does not have chest pain. He has not had leg swelling. He has minimal sinus congestion. He does not have gastroesophageal reflux disease.  When he said his best in the last year he says he can walk on level ground as far as he needs to he can climb a flight of stairs without shortness of breath.  Past Medical History  Diagnosis Date  . Atrial fibrillation     chronic  . Hypertension   . Hyperkalemia   . Acute systolic heart failure   . Coronary artery disease     Cath 2005- patent LIMA-LAD, SVG-OM grafts, moderate MR  . Prostate cancer 04/2009  . Hyperlipidemia   . COPD (chronic obstructive pulmonary disease)     URI 9/12  . DM type 2 (diabetes mellitus, type 2)   . PNA (pneumonia) 03/2010   . Glaucoma(365)   . Hard of hearing   . Myocardial infarction   . CHF (congestive heart failure)   . Gout   . Adenomatous polyps   . Diverticulosis     left side   . H/O mitral valve prolapse   . Sigmoid diverticulitis     perforated     Family History  Problem Relation Age of Onset  . Coronary artery disease Mother   . Heart attack Mother     x10  . Heart disease Mother   . Stroke Father   . Heart disease Father   . Heart attack Brother     with bypass   . Heart attack Sister     with bypass   . Cancer Sister     bone  . Pancreatic cancer Brother     pancreatic  . Heart disease Maternal Grandmother   . Heart disease Maternal Grandfather   . Leukemia Sister   . Cancer Maternal Aunt     ? type     History   Social History  . Marital Status: Married    Spouse Name: N/A    Number of Children: 3  . Years of Education: N/A   Occupational History  . retired IT sales professional    Social History Main Topics  . Smoking status: Former Smoker -- 4.0 packs/day  for 23 years    Types: Cigarettes    Quit date: 12/23/1987  . Smokeless tobacco: Never Used  . Alcohol Use: No  . Drug Use: No  . Sexually Active: Not on file   Other Topics Concern  . Not on file   Social History Narrative   Lives in [place], [place] with wife.      Allergies  Allergen Reactions  . Acetaminophen-Codeine     REACTION: hives  . Altace (Ramipril)   . Codeine     REACTION: hives  . Sulfonamide Derivatives     REACTION: hives     Outpatient Prescriptions Prior to Visit  Medication Sig Dispense Refill  . budesonide-formoterol (SYMBICORT) 160-4.5 MCG/ACT inhaler Inhale 2 puffs into the lungs 2 (two) times daily.      . carvedilol (COREG) 12.5 MG tablet Take 1.5 tablets (18.75 mg total) by mouth 2 (two) times daily with a meal.  270 tablet  3  . Cholecalciferol (VITAMIN D3) 1000 UNITS CAPS Take 1 capsule by mouth daily.        Marland Kitchen dextromethorphan-guaiFENesin (MUCINEX DM) 30-600 MG per 12 hr  tablet Take 1 tablet by mouth every 12 (twelve) hours.      . digoxin (LANOXIN) 0.125 MG tablet Take 125 mcg by mouth daily.      . furosemide (LASIX) 20 MG tablet Take 20 mg by mouth daily.      Marland Kitchen latanoprost (XALATAN) 0.005 % ophthalmic solution Place 1 drop into both eyes at bedtime.  2.5 mL  1  . losartan (COZAAR) 100 MG tablet Take 0.5 tablets (50 mg total) by mouth daily.  90 tablet  3  . metFORMIN (GLUCOPHAGE) 500 MG tablet Take 500 mg by mouth 2 (two) times daily with a meal.       . OVER THE COUNTER MEDICATION Take 1 tablet by mouth daily. Preservision Eye Vitamins      . simvastatin (ZOCOR) 40 MG tablet Take 40 mg by mouth at bedtime.       . Tamsulosin HCl (FLOMAX) 0.4 MG CAPS Take 0.4 mg by mouth daily.       Marland Kitchen warfarin (COUMADIN) 2.5 MG tablet Take 2.5 mg by mouth daily. Take 2.5 mg on Mon, Tues, Thurs,Fri, and Sun. Take 5 mg on Wed and Saturday.      . [DISCONTINUED] albuterol-ipratropium (COMBIVENT) 18-103 MCG/ACT inhaler Inhale 2 puffs into the lungs as needed. For shortness of breath      . [DISCONTINUED] ipratropium-albuterol (DUONEB) 0.5-2.5 (3) MG/3ML SOLN Take 3 mLs by nebulization every 6 (six) hours as needed.  360 mL  12   Last reviewed on 10/25/2012  9:14 AM by Christen Butter, CMA   Review of Systems  Constitutional: Negative for fever and unexpected weight change.  HENT: Positive for sore throat and sneezing. Negative for ear pain, nosebleeds, congestion, rhinorrhea, trouble swallowing, dental problem, postnasal drip and sinus pressure.   Eyes: Negative for redness and itching.  Respiratory: Positive for cough, shortness of breath and wheezing. Negative for chest tightness.   Cardiovascular: Negative for palpitations and leg swelling.  Gastrointestinal: Negative for nausea and vomiting.  Genitourinary: Negative for dysuria.  Musculoskeletal: Negative for joint swelling.  Skin: Negative for rash.  Neurological: Negative for headaches.  Hematological: Does not  bruise/bleed easily.  Psychiatric/Behavioral: Negative for dysphoric mood. The patient is not nervous/anxious.        Objective:   Physical Exam  Filed Vitals:   10/25/12 0918  BP: 120/68  Pulse: 60  Temp: 97.5 F (36.4 C)  TempSrc: Oral  Height: 5\' 6"  (1.676 m)  Weight: 169 lb (76.658 kg)  SpO2: 97%   Walked 500 feet with normal oxygen saturation on room air  Gen: well appearing, no acute distress HEENT: NCAT, PERRL, EOMi, OP clear, neck supple without masses PULM:  Increased AP diameter but CTA B CV: Irreg irreg, mechanical S1, no JVD AB: BS+, soft, nontender, no hsm Ext: warm, no edema, no clubbing, no cyanosis Derm: no rash or skin breakdown Neuro: A&Ox4, CN II-XII intact, strength 5/5 in all 4 extremities  12/2011 CXR [facility] Cardiomegally, L effusion? Continuous diaphragm sign 10/25/2012 Simple spirometry: Ratio 66%, FEV1 1.42 L (48% predicted)     Assessment & Plan:   COPD (chronic obstructive pulmonary disease) COPD: GOLD Grade D (due to multiple exacerbations) Combined recommendations from the [employer], [employer] of [employer], Designer, television/film set, European Respiratory Society (Qaseem A et al, Ann Intern Med. 2011;155(3):179) recommends tobacco cessation, pulmonary rehab (for symptomatic patients with an FEV1 < 50% predicted), supplemental oxygen (for patients with SaO2 <88% or paO2 <55), and appropriate bronchodilator therapy.  In regards to long acting bronchodilators, they recommend monotherapy (FEV1 60-80% with symptoms weak evidence, FEV1 with symptoms <60% strong evidence), or combination therapy (FEV1 <60% with symptoms, strong recommendation, moderate evidence).  One should also provide patients with annual immunizations and consider therapy for prevention of COPD exacerbations (ie. roflumilast or azithromycin) when appopriate.  -O2 therapy: Not indicated -Immunizations: Pneumovax UTD, Flu given today -Tobacco use: quit  1989 -Exercise: encouraged regular exercise -Bronchodilator therapy: Add spiriva daily, use symbicort bid, stop ipratropium meds; start albuterol prn; instructed on proper technique -Exacerbation prevention: Biggest area we need to work on; Start with spiriva, proper symbicort use; if no improvement then will need roflumilast -Check CXR    Updated Medication List Outpatient Encounter Prescriptions as of 10/25/2012  Medication Sig Dispense Refill  . budesonide-formoterol (SYMBICORT) 160-4.5 MCG/ACT inhaler Inhale 2 puffs into the lungs 2 (two) times daily.      . carvedilol (COREG) 12.5 MG tablet Take 1.5 tablets (18.75 mg total) by mouth 2 (two) times daily with a meal.  270 tablet  3  . Cholecalciferol (VITAMIN D3) 1000 UNITS CAPS Take 1 capsule by mouth daily.        Marland Kitchen dextromethorphan-guaiFENesin (MUCINEX DM) 30-600 MG per 12 hr tablet Take 1 tablet by mouth every 12 (twelve) hours.      . digoxin (LANOXIN) 0.125 MG tablet Take 125 mcg by mouth daily.      . furosemide (LASIX) 20 MG tablet Take 20 mg by mouth daily.      Marland Kitchen latanoprost (XALATAN) 0.005 % ophthalmic solution Place 1 drop into both eyes at bedtime.  2.5 mL  1  . losartan (COZAAR) 100 MG tablet Take 0.5 tablets (50 mg total) by mouth daily.  90 tablet  3  . metFORMIN (GLUCOPHAGE) 500 MG tablet Take 500 mg by mouth 2 (two) times daily with a meal.       . OVER THE COUNTER MEDICATION Take 1 tablet by mouth daily. Preservision Eye Vitamins      . simvastatin (ZOCOR) 40 MG tablet Take 40 mg by mouth at bedtime.       . Tamsulosin HCl (FLOMAX) 0.4 MG CAPS Take 0.4 mg by mouth daily.       Marland Kitchen warfarin (COUMADIN) 2.5 MG tablet Take 2.5 mg by mouth daily. Take 2.5 mg on Mon,  Tues, Thurs,Fri, and Sun. Take 5 mg on Wed and Saturday.      . [DISCONTINUED] albuterol-ipratropium (COMBIVENT) 18-103 MCG/ACT inhaler Inhale 2 puffs into the lungs as needed. For shortness of breath      . [DISCONTINUED] ipratropium-albuterol (DUONEB) 0.5-2.5 (3)  MG/3ML SOLN Take 3 mLs by nebulization every 6 (six) hours as needed.  360 mL  12  . albuterol (PROAIR HFA) 108 (90 BASE) MCG/ACT inhaler Inhale 2 puffs into the lungs every 6 (six) hours as needed for wheezing or shortness of breath.  1 Inhaler  2  . tiotropium (SPIRIVA HANDIHALER) 18 MCG inhalation capsule Place 1 capsule (18 mcg total) into inhaler and inhale daily.  30 capsule  2

## 2012-10-26 ENCOUNTER — Ambulatory Visit (INDEPENDENT_AMBULATORY_CARE_PROVIDER_SITE_OTHER): Payer: Medicare Other | Admitting: Cardiovascular Disease

## 2012-10-26 ENCOUNTER — Encounter: Payer: Self-pay | Admitting: Cardiovascular Disease

## 2012-10-26 VITALS — BP 122/72 | HR 66 | Ht 66.0 in | Wt 170.8 lb

## 2012-10-26 DIAGNOSIS — I25810 Atherosclerosis of coronary artery bypass graft(s) without angina pectoris: Secondary | ICD-10-CM | POA: Diagnosis not present

## 2012-10-26 DIAGNOSIS — I4891 Unspecified atrial fibrillation: Secondary | ICD-10-CM | POA: Diagnosis not present

## 2012-10-26 DIAGNOSIS — Z9889 Other specified postprocedural states: Secondary | ICD-10-CM

## 2012-10-26 DIAGNOSIS — Z954 Presence of other heart-valve replacement: Secondary | ICD-10-CM | POA: Diagnosis not present

## 2012-10-26 DIAGNOSIS — E119 Type 2 diabetes mellitus without complications: Secondary | ICD-10-CM

## 2012-10-26 DIAGNOSIS — R0602 Shortness of breath: Secondary | ICD-10-CM

## 2012-10-26 DIAGNOSIS — I10 Essential (primary) hypertension: Secondary | ICD-10-CM

## 2012-10-26 DIAGNOSIS — J4489 Other specified chronic obstructive pulmonary disease: Secondary | ICD-10-CM

## 2012-10-26 DIAGNOSIS — J449 Chronic obstructive pulmonary disease, unspecified: Secondary | ICD-10-CM

## 2012-10-26 DIAGNOSIS — Z952 Presence of prosthetic heart valve: Secondary | ICD-10-CM

## 2012-10-26 NOTE — Assessment & Plan Note (Signed)
We have encouraged continued exercise, careful diet management in an effort to lose weight. 

## 2012-10-26 NOTE — Progress Notes (Signed)
Patient ID: [name], male    DOB: 09-27-1941, 71 y.o.   MRN: [id number]  HPI Comments: Mr. Couzens is a very pleasant 71 year old gentleman with a past medical history of coronary artery disease, bypass surgery with a  left internal mammary to the LAD and saphenous vein graft to the OM in 1989, PCI of the RCA in 2002, COPD, mitral valve replacement with prosthetic valve on Coumadin, history of chronic atrial fibrillation, diabetes, hypertension, hyperlipidemia, PNA in 03/2010, who presents for routine followup.   admitted to the hospital from September 04 2011 with atrial fibrillation with RVR, chest pain, bronchitis, COPD exacerbation. He was treated with steroids, antibiotics, rate control for his atrial fibrillation with improvement of his symptoms.   He was admitted again to the hospital January 06 2012 with shortness of breath and congestion felt to have COPD exacerbation, started on her buttocks, nebulizers, steroids. He suffered a perforated bowel with air under his diaphragm and was transferred to [facility] and within 48 hours, had surgery performed. He presents today with an ostomy. 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 He had a 24 pound weight loss through this procedure, open abdominal wall wound healing by secondary intention.  He was discharged with underlying anemia, hematocrit 27 INR of 4  He continues to have his ostomy. After hearing the risk and benefit, he has decided not to have this repaired. He has been battling with COPD exacerbation, wheezing over the past month. He had a course of prednisone, course of amoxicillin with improvement of his symptoms. Recently seen by Dr. Kendrick Fries of pulmonary and started on Spiriva. He is feeling better but still concerned about his breathing and wheezing. He is concerned it could be his heart.    last stress test in 2009, that showed ejection  fraction 43%, inferior wall infarct with no ischemia. MV was functioning well.    echocardiogram  confirms prosthetic mitral valve is working well with no significant regurgitation, ejection fraction 40-45% with inferior hypokinesis.   EKG shows atrial fibrillation with rate 66 beats per minute,Right bundle branch block, LAD/left anterior fascicular block,  no significant ST or T wave changes     Outpatient Encounter Prescriptions as of 10/26/2012  Medication Sig Dispense Refill  . albuterol (PROAIR HFA) 108 (90 BASE) MCG/ACT inhaler Inhale 2 puffs into the lungs every 6 (six) hours as needed for wheezing or shortness of breath.  1 Inhaler  2  . budesonide-formoterol (SYMBICORT) 160-4.5 MCG/ACT inhaler Inhale 2 puffs into the lungs 2 (two) times daily.      . carvedilol (COREG) 12.5 MG tablet Take 1.5 tablets (18.75 mg total) by mouth 2 (two) times daily with a meal.  270 tablet  3  . Cholecalciferol (VITAMIN D3) 1000 UNITS CAPS Take 1 capsule by mouth daily.        Marland Kitchen dextromethorphan-guaiFENesin (MUCINEX DM) 30-600 MG per 12 hr tablet Take 1 tablet by mouth every 12 (twelve) hours.      . digoxin (LANOXIN) 0.125 MG tablet Take 125 mcg by mouth daily.      . furosemide (LASIX) 20 MG tablet Take 20 mg by mouth daily.      Marland Kitchen latanoprost (XALATAN) 0.005 % ophthalmic solution Place 1 drop into both eyes at bedtime.  2.5 mL  1  . losartan (COZAAR) 100 MG tablet Take 0.5 tablets (50 mg total) by mouth daily.  90 tablet  3  .  metFORMIN (GLUCOPHAGE) 500 MG tablet Take 500 mg by mouth 2 (two) times daily with a meal.       . OVER THE COUNTER MEDICATION Take 1 tablet by mouth daily. Preservision Eye Vitamins      . simvastatin (ZOCOR) 40 MG tablet Take 40 mg by mouth at bedtime.       . Tamsulosin HCl (FLOMAX) 0.4 MG CAPS Take 0.4 mg by mouth daily.       Marland Kitchen tiotropium (SPIRIVA HANDIHALER) 18 MCG inhalation capsule Place 1 capsule (18 mcg total) into inhaler and inhale daily.  30 capsule  2  . warfarin  (COUMADIN) 2.5 MG tablet Take 2.5 mg by mouth daily. Take 2.5 mg on Mon, Tues, Thurs,Fri, and Sun. Take 5 mg on Wed and Saturday.        Review of Systems  Constitutional: Negative.   HENT: Negative.        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   Respiratory: Positive for shortness of breath.   Cardiovascular: Negative.   Gastrointestinal: Negative.   Musculoskeletal: Negative.   Skin: Negative.   Neurological: Negative.   Hematological: Negative.   Psychiatric/Behavioral: Negative.   All other systems reviewed and are negative.   BP 122/72  Pulse 66  Ht 5\' 6"  (1.676 m)  Wt 170 lb 12 oz (77.452 kg)  BMI 27.56 kg/m2  Physical Exam  Nursing note and vitals reviewed. Constitutional: He is oriented to person, place, and time. He appears well-developed and well-nourished.  HENT:  Head: Normocephalic.  Nose: Nose normal.  Mouth/Throat: Oropharynx is clear and moist.  Eyes: Conjunctivae normal are normal. Pupils are equal, round, and reactive to light.  Neck: Normal range of motion. Neck supple. No JVD present.  Cardiovascular: Normal rate, S1 normal, S2 normal and intact distal pulses.  An irregularly irregular rhythm present. Exam reveals no gallop and no friction rub.   Murmur heard.  Crescendo systolic murmur is present with a grade of 2/6  Pulmonary/Chest: Effort normal. No respiratory distress. He has decreased breath sounds. He has no wheezes. He has no rales. He exhibits no tenderness.  Abdominal: Soft. Bowel sounds are normal. He exhibits no distension. There is no tenderness.       Ostomy in place  Musculoskeletal: Normal range of motion. He exhibits no edema and no tenderness.  Lymphadenopathy:    He has no cervical adenopathy.  Neurological: He is alert and oriented to person, place, and time. Coordination normal.  Skin: Skin is warm and dry. No rash noted. No erythema.  Psychiatric: He has a normal mood and affect. His behavior is normal.  Judgment and thought content normal.           Assessment and Plan

## 2012-10-26 NOTE — Assessment & Plan Note (Signed)
Last echocardiogram January 2012. Repeat echocardiogram pending. Also to evaluate shortness of breath.

## 2012-10-26 NOTE — Assessment & Plan Note (Signed)
Heart rate well controlled. No changes to his medications 

## 2012-10-26 NOTE — Patient Instructions (Addendum)
You are doing well. No medication changes were made.  WE WILL SCHEDULE YOU FOR AN ECHOCARDIOGRAM FOR SHORTNESS OF BREATH, PROSTHETIC MITRAL VALVE  Please call us if you have new issues that need to be addressed before your next appt.  Your physician wants you to follow-up in: 3 months.  You will receive a reminder letter in the mail two months in advance. If you don't receive a letter, please call our office to schedule the follow-up appointment.

## 2012-10-26 NOTE — Assessment & Plan Note (Signed)
Under the care of Dr. Kendrick Fries. On inhalers with continued wheezing.

## 2012-10-26 NOTE — Assessment & Plan Note (Signed)
Currently with no symptoms of angina. No further workup at this time. Continue current medication regimen. 

## 2012-10-26 NOTE — Assessment & Plan Note (Signed)
He is concerned about some mild shortness of breath. Likely secondary to residual COPD exacerbation. Continued mild wheezing. Unable to exclude angina given the degree of underlying coronary artery disease. We have discussed echocardiography, stress testing. He would like to start with echocardiogram.

## 2012-10-28 DIAGNOSIS — H353190 Nonexudative age-related macular degeneration, unspecified eye, stage unspecified: Secondary | ICD-10-CM | POA: Diagnosis not present

## 2012-10-29 NOTE — Progress Notes (Signed)
Quick Note:  Spoke with pt and notified of results per Dr. Wert. Pt verbalized understanding and denied any questions.  ______ 

## 2012-11-03 ENCOUNTER — Ambulatory Visit (INDEPENDENT_AMBULATORY_CARE_PROVIDER_SITE_OTHER): Payer: Medicare Other

## 2012-11-03 DIAGNOSIS — Z7901 Long term (current) use of anticoagulants: Secondary | ICD-10-CM | POA: Diagnosis not present

## 2012-11-03 DIAGNOSIS — I059 Rheumatic mitral valve disease, unspecified: Secondary | ICD-10-CM | POA: Diagnosis not present

## 2012-11-03 DIAGNOSIS — Z9889 Other specified postprocedural states: Secondary | ICD-10-CM | POA: Diagnosis not present

## 2012-11-03 DIAGNOSIS — I4891 Unspecified atrial fibrillation: Secondary | ICD-10-CM | POA: Diagnosis not present

## 2012-11-04 ENCOUNTER — Ambulatory Visit (INDEPENDENT_AMBULATORY_CARE_PROVIDER_SITE_OTHER): Payer: Medicare Other | Admitting: Adult Health

## 2012-11-04 ENCOUNTER — Encounter: Payer: Self-pay | Admitting: Adult Health

## 2012-11-04 ENCOUNTER — Telehealth: Payer: Self-pay | Admitting: Pulmonary Disease

## 2012-11-04 VITALS — BP 112/62 | HR 96 | Temp 96.7°F | Ht 67.0 in | Wt 171.2 lb

## 2012-11-04 DIAGNOSIS — J449 Chronic obstructive pulmonary disease, unspecified: Secondary | ICD-10-CM

## 2012-11-04 MED ORDER — PREDNISONE 10 MG PO TABS: ORAL_TABLET | ORAL | Status: DC

## 2012-11-04 NOTE — Progress Notes (Signed)
  Subjective:    Patient ID: [name], male    DOB: Sep 17, 1941, 71 y.o.   MRN: [id number]  HPI 47 with known hx of COPD  12/26/2011 Simple spirometry: Ratio 66%, FEV1 1.42 L (48% predicted) CXR 10/2012 No active disease. No significant change.  11/04/2012 Acute OV  Complains of  wheezing, increased sob and dry cough x 1.5 weeks.   Not taking symbicort or spirva. Misunderstood instructions. Using ProAir several times a day.  No discolored mucus. No fever.  ProAir helps for short time but wheezing comes right back.  Also missed a few lasix doses. Denies edema or orthopnea.  On chronic coumadin for Afib -last INR 3.8 , coumadin adjusted . No hemoptysis      Review of Systems Constitutional:   No  weight loss, night sweats,  Fevers, chills,  +fatigue, or  lassitude.  HEENT:   No headaches,  Difficulty swallowing,  Tooth/dental problems, or  Sore throat,                No sneezing, itching, ear ache, nasal congestion, post nasal drip,   CV:  No chest pain,  Orthopnea, PND, swelling in lower extremities, anasarca, dizziness, palpitations, syncope.   GI  No heartburn, indigestion, abdominal pain, nausea, vomiting, diarrhea, change in bowel habits, loss of appetite, bloody stools.   Resp:    No coughing up of blood.     No chest wall deformity  Skin: no rash or lesions.  GU: no dysuria, change in color of urine, no urgency or frequency.  No flank pain, no hematuria   MS:  No joint pain or swelling.  No decreased range of motion.  No back pain.  Psych:  No change in mood or affect. No depression or anxiety.  No memory loss.         Objective:   Physical Exam GEN: A/Ox3; pleasant , NAD,    HEENT:  Gordon/AT,  EACs-clear, TMs-wnl, NOSE-clear, THROAT-clear, no lesions, no postnasal drip or exudate noted.   NECK:  Supple w/ fair ROM; no JVD; normal carotid impulses w/o bruits; no thyromegaly or nodules palpated; no lymphadenopathy.  RESP  Few scattered exp wheezes , no accessory  muscle use, no dullness to percussion  CARD: Irregular , no m/r/g  , no peripheral edema, pulses intact, no cyanosis or clubbing.  GI:   Soft & nt; nml bowel sounds; no organomegaly or masses detected.  Musco: Warm bil, no deformities or joint swelling noted.   Neuro: alert, no focal deficits noted.    Skin: Warm, no lesions or rashes         Assessment & Plan:

## 2012-11-04 NOTE — Telephone Encounter (Signed)
Spoke with pt. He states woke up early this am with wheezing and increased SOB with nor relief at all from rescue inhaler. OV with TP at 12 noon today. Pt aware of office location here.

## 2012-11-04 NOTE — Assessment & Plan Note (Signed)
Exacerbation  Advised on med compliance  Pt med education discussed   Plan Restart Symbicort 2 puffs Twice daily  -take everyday  Restart Spiriva 1 puff daily -take everyday  Prednisone taper over next week .  Extra lasix 20mg  daily today only .  Do not miss any lasix doses.  follow up Dr. Kendrick Fries as planned and As needed   Please contact office for sooner follow up if symptoms do not improve or worsen or seek emergency care

## 2012-11-04 NOTE — Patient Instructions (Addendum)
Restart Symbicort 2 puffs Twice daily  -take everyday  Restart Spiriva 1 puff daily -take everyday  Prednisone taper over next week .  Extra lasix 20mg  daily today only .  Do not miss any lasix doses.  follow up Dr. Kendrick Fries as planned and As needed   Please contact office for sooner follow up if symptoms do not improve or worsen or seek emergency care

## 2012-11-09 ENCOUNTER — Other Ambulatory Visit (INDEPENDENT_AMBULATORY_CARE_PROVIDER_SITE_OTHER): Payer: Medicare Other

## 2012-11-09 ENCOUNTER — Telehealth: Payer: Self-pay

## 2012-11-09 ENCOUNTER — Encounter: Payer: Self-pay | Admitting: Pulmonary Disease

## 2012-11-09 ENCOUNTER — Ambulatory Visit (INDEPENDENT_AMBULATORY_CARE_PROVIDER_SITE_OTHER): Payer: Medicare Other | Admitting: Pulmonary Disease

## 2012-11-09 ENCOUNTER — Other Ambulatory Visit: Payer: Self-pay

## 2012-11-09 VITALS — BP 132/60 | HR 106 | Temp 97.5°F | Ht 67.0 in | Wt 169.8 lb

## 2012-11-09 DIAGNOSIS — J9611 Chronic respiratory failure with hypoxia: Secondary | ICD-10-CM | POA: Insufficient documentation

## 2012-11-09 DIAGNOSIS — R000 Tachycardia, unspecified: Secondary | ICD-10-CM

## 2012-11-09 DIAGNOSIS — J9610 Chronic respiratory failure, unspecified whether with hypoxia or hypercapnia: Secondary | ICD-10-CM

## 2012-11-09 DIAGNOSIS — Z952 Presence of prosthetic heart valve: Secondary | ICD-10-CM

## 2012-11-09 DIAGNOSIS — I10 Essential (primary) hypertension: Secondary | ICD-10-CM

## 2012-11-09 DIAGNOSIS — J449 Chronic obstructive pulmonary disease, unspecified: Secondary | ICD-10-CM

## 2012-11-09 DIAGNOSIS — R0602 Shortness of breath: Secondary | ICD-10-CM

## 2012-11-09 DIAGNOSIS — R0902 Hypoxemia: Secondary | ICD-10-CM

## 2012-11-09 MED ORDER — LEVOFLOXACIN 500 MG PO TABS: 500.0000 mg | ORAL_TABLET | Freq: Every day | ORAL | Status: DC

## 2012-11-09 MED ORDER — BUDESONIDE-FORMOTEROL FUMARATE 160-4.5 MCG/ACT IN AERO: 2.0000 | INHALATION_SPRAY | Freq: Two times a day (BID) | RESPIRATORY_TRACT | Status: DC

## 2012-11-09 MED ORDER — ARFORMOTEROL TARTRATE 15 MCG/2ML IN NEBU: 15.0000 ug | INHALATION_SOLUTION | Freq: Two times a day (BID) | RESPIRATORY_TRACT | Status: DC

## 2012-11-09 MED ORDER — LEVOFLOXACIN 500 MG PO TABS: 500.0000 mg | ORAL_TABLET | Freq: Every day | ORAL | Status: AC

## 2012-11-09 MED ORDER — BUDESONIDE 0.25 MG/2ML IN SUSP: 0.2500 mg | Freq: Two times a day (BID) | RESPIRATORY_TRACT | Status: DC

## 2012-11-09 MED ORDER — BUDESONIDE 0.25 MG/2ML IN SUSP: 0.2500 mg | Freq: Every day | RESPIRATORY_TRACT | Status: DC

## 2012-11-09 NOTE — Patient Instructions (Addendum)
Stop taking symbicort Start taking brovana and pulmicort nebulizers twice a day Start taking the Levaquin once a day for one week (antibiotic) Use the oxygen as prescribed Keep using the spiriva We will have you get a CT scan tomorrow and call you with the results  Call us if you are not getting better  We will see you back in two weeks or sooner if needed

## 2012-11-09 NOTE — Telephone Encounter (Signed)
Patient had an Echo today. Patient states not feeling any better, was seen last Thursday by Rubye Oaks, NP for a cough, wheezing and shortness of breath. Spoke with Dr. Ulyses Jarred office for an appointment. The patient will see Dr. Kendrick Fries today at 4 pm.

## 2012-11-09 NOTE — Progress Notes (Signed)
Subjective:    Patient ID: [name], male    DOB: 1941-02-18, 71 y.o.   MRN: [id number]  Synopsis: [name] was first seen in the [facility] office in 10/2012 for COPD.  He had been admitted to the hospital for bronchitis in months prior.  His spirometry showed clear obstruction and an FEV1 of 48% predicted.  He also has a history of a CABG and Mitral valve replacement.  HPI  11/09/2012 ROV -- [name] says that he has not felt any better since he saw [place] last. He went to the [place] office for an acute visit and was given prednisone but he says it didn't help.  He is coughing up dark brown sputum in the mornings, not as much throughout the day.  He says that he has been noticing "gurgling" a lot while sitting or lying still.  He often has to get up in the middle of the night because of this.  He gets relief from the albuterol neb he has at home. He has been using the spiriva and the symbicort but he's not sure if they help.  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  He has not had chest pain and he has not had leg swelling.  He has not been choking on food or noticing food going down the wrong pipe.  No nausea or vomiting.  Past Medical History  Diagnosis Date  . Atrial fibrillation     chronic  . Hypertension   . Hyperkalemia   . Acute systolic heart failure   . Coronary artery disease     Cath 2005- patent LIMA-LAD, SVG-OM grafts, moderate MR  . Prostate cancer 04/2009  . Hyperlipidemia   . COPD (chronic obstructive pulmonary disease)     URI 9/12  . DM type 2 (diabetes mellitus, type 2)   . PNA (pneumonia) 03/2010  . Glaucoma(365)   . Hard of hearing   . Myocardial infarction   . CHF (congestive heart failure)   . Gout   . Adenomatous polyps   . Diverticulosis     left side   . H/O mitral valve prolapse   . Sigmoid diverticulitis     perforated      Review of Systems    Constitutional: Positive for fatigue. Negative for fever and chills.  HENT: Negative for congestion, rhinorrhea and postnasal drip.   Respiratory: Positive for cough, shortness of breath and wheezing. Negative for choking.   Cardiovascular: Positive for palpitations. Negative for chest pain and leg swelling.  Gastrointestinal: Negative for nausea, abdominal pain, diarrhea, constipation, abdominal distention and rectal pain.       Objective:   Physical Exam  Filed Vitals:   11/09/12 1610  BP: 132/60  Pulse: 106  Temp: 97.5 F (36.4 C)  TempSrc: Oral  Height: 5\' 7"  (1.702 m)  Weight: 169 lb 12.8 oz (77.021 kg)  SpO2: 95%   Dropped to 86% while walking on room air, improved with 2 L O2  Gen:  no acute distress HEENT: NCAT, PERRL, EOMi, OP clear PULM: Rhonchi loudest Left base, no wheezing, no clear crackles CV: Irreg Irreg, mechanical S1, no JVD AB: BS+, soft, nontender, no hsm Ext: warm, no edema, no clubbing, no cyanosis  10/25/2012 CXR >> cardiomegally, no clear infiltrate 10/25/2012 Spirometry >> clear obstruction on flow volume loop, FEV1 48% predicted      Assessment &  Plan:   COPD (chronic obstructive pulmonary disease) Rainer's shortness of breath and hypoxemia is persistent and are do to his COPD in part, however I do not think that this is the primary cause today. He is not wheezing and on physical exam it sounds as if he may have either a consolidative process or some sort of aspiration in his left lung.   I'm not convinced that he is taking the Symbicort appropriately.  Plan: -I will treat him for pneumonia with Levaquin 750 mg daily -We need to get a CT scan of his chest because multiple recent chest x-rays have been negative but there seems to be something in the left lung. I think given his cardiomegaly it's been difficult to interpret his chest x-rays so a CT will be helpful here.  -Stop Symbicort -Start nebulized alformoterol and budesonide -continue  spiriva -Start oxygen 2 L continuously, may adjust back to with exertion only on the next visit.  Hypoxemia He does not appear volume overloaded on exam today yet he is hypoxemic with exertion. I think this is likely due to VQ mismatch from his chronic bronchitis due to COPD. I am also concerned that there is a pneumonia or a second consolidative process contributing to his symptoms.  Plan: -See above -CT scan tomorrow -2 L of oxygen continuously prescribed    Updated Medication List Outpatient Encounter Prescriptions as of 11/09/2012  Medication Sig Dispense Refill  . albuterol (PROAIR HFA) 108 (90 BASE) MCG/ACT inhaler Inhale 2 puffs into the lungs every 6 (six) hours as needed for wheezing or shortness of breath.  1 Inhaler  2  . carvedilol (COREG) 12.5 MG tablet Take 1.5 tablets (18.75 mg total) by mouth 2 (two) times daily with a meal.  270 tablet  3  . Cholecalciferol (VITAMIN D3) 1000 UNITS CAPS Take 1 capsule by mouth daily.        Marland Kitchen dextromethorphan-guaiFENesin (MUCINEX DM) 30-600 MG per 12 hr tablet Take 1 tablet by mouth every 12 (twelve) hours.      . digoxin (LANOXIN) 0.125 MG tablet Take 125 mcg by mouth daily.      . furosemide (LASIX) 20 MG tablet Take 20 mg by mouth daily.      Marland Kitchen latanoprost (XALATAN) 0.005 % ophthalmic solution Place 1 drop into both eyes at bedtime.  2.5 mL  1  . losartan (COZAAR) 100 MG tablet Take 0.5 tablets (50 mg total) by mouth daily.  90 tablet  3  . metFORMIN (GLUCOPHAGE) 500 MG tablet Take 500 mg by mouth 2 (two) times daily with a meal.       . OVER THE COUNTER MEDICATION Take 1 tablet by mouth daily. Preservision Eye Vitamins      . predniSONE (DELTASONE) 10 MG tablet 4 tabs for 2 days, then 3 tabs for 2 days, 2 tabs for 2 days, then 1 tab for 2 days, then stop  20 tablet  0  . simvastatin (ZOCOR) 40 MG tablet Take 40 mg by mouth at bedtime.       . Tamsulosin HCl (FLOMAX) 0.4 MG CAPS Take 0.4 mg by mouth daily.       Marland Kitchen tiotropium (SPIRIVA  HANDIHALER) 18 MCG inhalation capsule Place 1 capsule (18 mcg total) into inhaler and inhale daily.  30 capsule  2  . warfarin (COUMADIN) 2.5 MG tablet Take 2.5 mg by mouth daily. Take 2.5 mg on Mon, Tues, Thurs,Fri, and Sun. Take 5 mg on Wed and Saturday.      . [  DISCONTINUED] budesonide-formoterol (SYMBICORT) 160-4.5 MCG/ACT inhaler Inhale 2 puffs into the lungs 2 (two) times daily.      . [DISCONTINUED] budesonide-formoterol (SYMBICORT) 160-4.5 MCG/ACT inhaler Inhale 2 puffs into the lungs 2 (two) times daily.  3 Inhaler  1  . arformoterol (BROVANA) 15 MCG/2ML NEBU Take 2 mLs (15 mcg total) by nebulization 2 (two) times daily.  120 mL  5  . budesonide (PULMICORT) 0.25 MG/2ML nebulizer solution Take 2 mLs (0.25 mg total) by nebulization 2 (two) times daily.  120 mL  5  . levofloxacin (LEVAQUIN) 500 MG tablet Take 1 tablet (500 mg total) by mouth daily.  10 tablet  0  . levofloxacin (LEVAQUIN) 500 MG tablet Take 1 tablet (500 mg total) by mouth daily.  7 tablet  0  . [DISCONTINUED] arformoterol (BROVANA) 15 MCG/2ML NEBU Take 2 mLs (15 mcg total) by nebulization 2 (two) times daily.  120 mL  5  . [DISCONTINUED] budesonide (PULMICORT) 0.25 MG/2ML nebulizer solution Take 2 mLs (0.25 mg total) by nebulization daily.  60 mL  12

## 2012-11-09 NOTE — Assessment & Plan Note (Addendum)
He does not appear volume overloaded on exam today yet he is hypoxemic with exertion. I think this is likely due to VQ mismatch from his chronic bronchitis due to COPD. I am also concerned that there is a pneumonia or a second consolidative process contributing to his symptoms.  Plan: -See above -CT scan tomorrow -2 L of oxygen continuously prescribed

## 2012-11-09 NOTE — Assessment & Plan Note (Addendum)
[name]'s shortness of breath and hypoxemia is persistent and are do to his COPD in part, however I do not think that this is the primary cause today. He is not wheezing and on physical exam it sounds as if he may have either a consolidative process or some sort of aspiration in his left lung.   I'm not convinced that he is taking the Symbicort appropriately.  Plan: -I will treat him for pneumonia with Levaquin 750 mg daily -We need to get a CT scan of his chest because multiple recent chest x-rays have been negative but there seems to be something in the left lung. I think given his cardiomegaly it's been difficult to interpret his chest x-rays so a CT will be helpful here.  -Stop Symbicort -Start nebulized alformoterol and budesonide -continue spiriva -Start oxygen 2 L continuously, may adjust back to with exertion only on the next visit.

## 2012-11-10 ENCOUNTER — Telehealth: Payer: Self-pay

## 2012-11-10 ENCOUNTER — Telehealth: Payer: Self-pay | Admitting: Pulmonary Disease

## 2012-11-10 MED ORDER — ARFORMOTEROL TARTRATE 15 MCG/2ML IN NEBU: 15.0000 ug | INHALATION_SOLUTION | Freq: Two times a day (BID) | RESPIRATORY_TRACT | Status: DC

## 2012-11-10 MED ORDER — BUDESONIDE 0.25 MG/2ML IN SUSP: 0.2500 mg | Freq: Two times a day (BID) | RESPIRATORY_TRACT | Status: DC

## 2012-11-10 NOTE — Telephone Encounter (Signed)
"  Call pt and have him f/u with me in the next 1-2 weeks for f/u to echo and to discuss low EF" VO Dr.Gollan/Dametra Whetsel Andi Hence, RN  Pt informed appt made for 12/2 at 0945

## 2012-11-10 NOTE — Telephone Encounter (Signed)
Spoke with pt and [facility] had not called him to set up delivery of his o2 and nebs. Spoke with sharon at [facility] o2 and neb machine will be  delivered today,pt needs a rx for the neb meds faxed over to [facility] at [phone].rx's printed and fax again    fyi Also pt is scheduled for ct scan on 11-11-12.

## 2012-11-11 ENCOUNTER — Ambulatory Visit (INDEPENDENT_AMBULATORY_CARE_PROVIDER_SITE_OTHER)
Admission: RE | Admit: 2012-11-11 | Discharge: 2012-11-11 | Disposition: A | Discharge status: Home / Self Care | Payer: Medicare Other | Source: Ambulatory Visit | Attending: Pulmonary Disease | Admitting: Pulmonary Disease

## 2012-11-11 DIAGNOSIS — J9610 Chronic respiratory failure, unspecified whether with hypoxia or hypercapnia: Secondary | ICD-10-CM

## 2012-11-11 DIAGNOSIS — J9611 Chronic respiratory failure with hypoxia: Secondary | ICD-10-CM

## 2012-11-11 DIAGNOSIS — J180 Bronchopneumonia, unspecified organism: Secondary | ICD-10-CM | POA: Diagnosis not present

## 2012-11-11 DIAGNOSIS — R0602 Shortness of breath: Secondary | ICD-10-CM | POA: Diagnosis not present

## 2012-11-13 ENCOUNTER — Telehealth: Payer: Self-pay | Admitting: Pulmonary Disease

## 2012-11-13 NOTE — Telephone Encounter (Signed)
I called [name] to discuss his CT results.  He is doing much better on the Levaquin and says that he feels normal now.  We will see him back in two weeks.

## 2012-11-15 NOTE — Progress Notes (Signed)
Quick Note:  Spoke with pt and notified of results per Dr. McQuaid. Pt verbalized understanding and denied any questions.  ______ 

## 2012-11-22 ENCOUNTER — Encounter: Payer: Self-pay | Admitting: Cardiovascular Disease

## 2012-11-22 ENCOUNTER — Other Ambulatory Visit: Payer: Self-pay

## 2012-11-22 ENCOUNTER — Ambulatory Visit (INDEPENDENT_AMBULATORY_CARE_PROVIDER_SITE_OTHER): Payer: Medicare Other | Admitting: Cardiovascular Disease

## 2012-11-22 VITALS — BP 157/80 | HR 52 | Ht 67.0 in | Wt 168.8 lb

## 2012-11-22 DIAGNOSIS — I2510 Atherosclerotic heart disease of native coronary artery without angina pectoris: Secondary | ICD-10-CM

## 2012-11-22 DIAGNOSIS — I5022 Chronic systolic (congestive) heart failure: Secondary | ICD-10-CM

## 2012-11-22 DIAGNOSIS — J449 Chronic obstructive pulmonary disease, unspecified: Secondary | ICD-10-CM

## 2012-11-22 DIAGNOSIS — I25810 Atherosclerosis of coronary artery bypass graft(s) without angina pectoris: Secondary | ICD-10-CM

## 2012-11-22 DIAGNOSIS — E785 Hyperlipidemia, unspecified: Secondary | ICD-10-CM

## 2012-11-22 DIAGNOSIS — I4891 Unspecified atrial fibrillation: Secondary | ICD-10-CM | POA: Diagnosis not present

## 2012-11-22 NOTE — Assessment & Plan Note (Signed)
Significant improvement recently on prednisone and steroids. He seems to have several COPD flares yearly. Under management of Dr. Kendrick Fries.

## 2012-11-22 NOTE — Assessment & Plan Note (Signed)
Does not appear to be fluid overloaded. Overall appears euvolemic. We'll continue current medication regimen.

## 2012-11-22 NOTE — Assessment & Plan Note (Signed)
Currently with no symptoms of angina. No further workup at this time. Continue current medication regimen. 

## 2012-11-22 NOTE — Progress Notes (Signed)
Patient ID: [name], male    DOB: 01/14/1941, 71 y.o.   MRN: [id number]  HPI Comments: [name] is a very pleasant 71 year old gentleman with a past medical history of coronary artery disease, bypass surgery with a left internal mammary to the LAD and saphenous vein graft to the OM in 1989, PCI of the RCA in 2002, COPD, mitral valve replacement with prosthetic valve on Coumadin, history of chronic atrial fibrillation, diabetes, hypertension, hyperlipidemia, PNA in 03/2010, who presents for routine followup.   admitted to the hospital from September 04 2011 with atrial fibrillation with RVR, chest pain, bronchitis, COPD exacerbation. He was treated with steroids, antibiotics, rate control for his atrial fibrillation with improvement of his symptoms.   He was admitted again to the hospital January 06 2012 with shortness of breath and congestion felt to have COPD exacerbation, started on her buttocks, nebulizers, steroids. He suffered a perforated bowel with air under his diaphragm and was transferred to [facility] and within 48 hours, had surgery performed. He presents today with an ostomy. 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 He had a 24 pound weight loss through this procedure, open abdominal wall wound healing by secondary intention.  He was discharged with underlying anemia, hematocrit 27 INR of 4  He continues to have his ostomy. he has decided not to have this repaired. He has been battling with COPD exacerbation, He had a course of prednisone, and antibiotics with improvement of his symptoms. Recently seen by Dr. Kendrick Fries.  Repeat echocardiogram for shortness of breath  starting in October shows a decline in his ejection fraction to 20-30%, worsening inferior, posterior and lateral wall hypokinesis (inferior and posterior wall are almost akinetic). Prior ejection fraction 40-45%.  He does  report worsening shortness of breath, some chest tightness in October of 2013. He felt this was secondary to bronchitis or pneumonia. Symptoms seem to have improved though he does have sporadic tightness in his chest.    last stress test in 2009, that showed ejection fraction 43%, inferior wall infarct with no ischemia. MV was functioning well.      EKG shows atrial fibrillation on 11/09/2012     Outpatient Encounter Prescriptions as of 11/22/2012  Medication Sig Dispense Refill  . carvedilol (COREG) 12.5 MG tablet Take 1.5 tablets (18.75 mg total) by mouth 2 (two) times daily with a meal.  270 tablet  3  . Cholecalciferol (VITAMIN D3) 1000 UNITS CAPS Take 1 capsule by mouth daily.        Marland Kitchen dextromethorphan-guaiFENesin (MUCINEX DM) 30-600 MG per 12 hr tablet Take 1 tablet by mouth every 12 (twelve) hours.      . digoxin (LANOXIN) 0.125 MG tablet Take 125 mcg by mouth daily.      . furosemide (LASIX) 20 MG tablet Take 20 mg by mouth daily.      Marland Kitchen latanoprost (XALATAN) 0.005 % ophthalmic solution Place 1 drop into both eyes at bedtime.  2.5 mL  1  . losartan (COZAAR) 100 MG tablet Take 0.5 tablets (50 mg total) by mouth daily.  90 tablet  3  . metFORMIN (GLUCOPHAGE) 500 MG tablet Take 500 mg by mouth 2 (two) times daily with a meal.       . OVER THE COUNTER MEDICATION Take 1 tablet by mouth daily. Preservision Eye Vitamins      . simvastatin (ZOCOR) 40 MG tablet Take 40 mg by mouth  at bedtime.       . Tamsulosin HCl (FLOMAX) 0.4 MG CAPS Take 0.4 mg by mouth daily.       Marland Kitchen tiotropium (SPIRIVA HANDIHALER) 18 MCG inhalation capsule Place 1 capsule (18 mcg total) into inhaler and inhale daily.  30 capsule  2  . warfarin (COUMADIN) 2.5 MG tablet Take 2.5 mg by mouth daily. Take 2.5 mg on Mon, Tues, Thurs,Fri, and Sun. Take 5 mg on Wed and Saturday.      . [DISCONTINUED] albuterol (PROAIR HFA) 108 (90 BASE) MCG/ACT inhaler Inhale 2 puffs into the lungs every 6 (six) hours as needed for wheezing or  shortness of breath.  1 Inhaler  2  . [DISCONTINUED] arformoterol (BROVANA) 15 MCG/2ML NEBU Take 2 mLs (15 mcg total) by nebulization 2 (two) times daily.  120 mL  5  . [DISCONTINUED] arformoterol (BROVANA) 15 MCG/2ML NEBU Take 2 mLs (15 mcg total) by nebulization 2 (two) times daily.  120 mL  5  . [DISCONTINUED] budesonide (PULMICORT) 0.25 MG/2ML nebulizer solution Take 2 mLs (0.25 mg total) by nebulization 2 (two) times daily.  120 mL  5  . [DISCONTINUED] budesonide (PULMICORT) 0.25 MG/2ML nebulizer solution Take 2 mLs (0.25 mg total) by nebulization 2 (two) times daily.  120 mL  5  . [DISCONTINUED] levofloxacin (LEVAQUIN) 500 MG tablet Take 1 tablet (500 mg total) by mouth daily.  7 tablet  0  . [DISCONTINUED] predniSONE (DELTASONE) 10 MG tablet 4 tabs for 2 days, then 3 tabs for 2 days, 2 tabs for 2 days, then 1 tab for 2 days, then stop  20 tablet  0    Review of Systems  Constitutional: Negative.   HENT: Negative.        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   Respiratory: Positive for chest tightness and shortness of breath.   Cardiovascular: Negative.   Gastrointestinal: Negative.   Musculoskeletal: Negative.   Skin: Negative.   Neurological: Negative.   Hematological: Negative.   Psychiatric/Behavioral: Negative.   All other systems reviewed and are negative.   BP 157/80  Pulse 52  Ht 5\' 7"  (1.702 m)  Wt 168 lb 12 oz (76.544 kg)  BMI 26.43 kg/m2  Physical Exam  Nursing note and vitals reviewed. Constitutional: He is oriented to person, place, and time. He appears well-developed and well-nourished.  HENT:  Head: Normocephalic.  Nose: Nose normal.  Mouth/Throat: Oropharynx is clear and moist.  Eyes: Conjunctivae normal are normal. Pupils are equal, round, and reactive to light.  Neck: Normal range of motion. Neck supple. No JVD present.  Cardiovascular: Normal rate, S1 normal, S2 normal and intact distal pulses.  An irregularly irregular rhythm  present. Exam reveals no gallop and no friction rub.   Murmur heard.  Crescendo systolic murmur is present with a grade of 2/6  Pulmonary/Chest: Effort normal. No respiratory distress. He has decreased breath sounds. He has no wheezes. He has no rales. He exhibits no tenderness.  Abdominal: Soft. Bowel sounds are normal. He exhibits no distension. There is no tenderness.       Ostomy in place  Musculoskeletal: Normal range of motion. He exhibits no edema and no tenderness.  Lymphadenopathy:    He has no cervical adenopathy.  Neurological: He is alert and oriented to person, place, and time. Coordination normal.  Skin: Skin is warm and dry. No rash noted. No erythema.  Psychiatric: He has a normal mood and affect. His behavior is normal.  Judgment and thought content normal.           Assessment and Plan

## 2012-11-22 NOTE — Assessment & Plan Note (Signed)
Labs not  available

## 2012-11-22 NOTE — Assessment & Plan Note (Addendum)
New wall motion abnormality and decline in his ejection fraction seen on echocardiogram concerning for RCA disease, possible occlusion of his stent from 11 years ago. He does continue to have shortness of breath but feels much better since antibiotics and prednisone for COPD exacerbation. We discussed medical management, stress testing, cardiac catheterization. He prefers to start with stress test. If this shows RCA distribution ischemia, would certainly need cardiac catheterization.

## 2012-11-22 NOTE — Patient Instructions (Addendum)
You are doing well. No medication changes were made.  We will schedule you for a stress test at [facility]  Please call us if you have new issues that need to be addressed before your next appt.  Your physician wants you to follow-up in: 6 months.  You will receive a reminder letter in the mail two months in advance. If you don't receive a letter, please call our office to schedule the follow-up appointment.    [facility]  Your caregiver has ordered a Stress Test with nuclear imaging. The purpose of this test is to evaluate the blood supply to your heart muscle. This procedure is referred to as a "Non-Invasive Stress Test." This is because other than having an IV started in your vein, nothing is inserted or "invades" your body. Cardiac stress tests are done to find areas of poor blood flow to the heart by determining the extent of coronary artery disease (CAD). Some patients exercise on a treadmill, which naturally increases the blood flow to your heart, while others who are  unable to walk on a treadmill due to physical limitations have a pharmacologic/chemical stress agent called Lexiscan . This medicine will mimic walking on a treadmill by temporarily increasing your coronary blood flow.   Please note: these test may take anywhere between 2-4 hours to complete  PLEASE REPORT TO [facility] MEDICAL MALL ENTRANCE  THE VOLUNTEERS AT THE FIRST DESK WILL DIRECT YOU WHERE TO GO  Date of Procedure:___12/4/13__________________________________  Arrival Time for Procedure:____7:15 am__________________________  Instructions regarding medication:   __x : Hold diabetes medication morning of procedure  __X__:  Hold betablocker(s) night before procedure and morning of procedure  (COREG)  ____:  Hold other medications as  follows:_________________________________________________________________________________________________________________________________________________________________________________________________________________________________________________________________________________________  PLEASE NOTIFY THE OFFICE AT LEAST 24 HOURS IN ADVANCE IF YOU ARE UNABLE TO KEEP YOUR APPOINTMENT.  [phone]  How to prepare for your Myoview test:  1. Do not eat or drink after midnight 2. No caffeine for 24 hours prior to test 3. Your medication may be taken with water.  If your doctor stopped a medication because of this test, do not take that medication. 4. Ladies, please do not wear dresses.  Skirts or pants are appropriate. Please wear a short sleeve shirt. 5. No perfume, cologne or lotion. 6. Wear comfortable walking shoes. No heels!

## 2012-11-23 ENCOUNTER — Encounter: Payer: Self-pay | Admitting: Pulmonary Disease

## 2012-11-23 ENCOUNTER — Ambulatory Visit (INDEPENDENT_AMBULATORY_CARE_PROVIDER_SITE_OTHER): Payer: Medicare Other | Admitting: Pulmonary Disease

## 2012-11-23 VITALS — BP 108/60 | HR 66 | Temp 97.4°F | Ht 67.0 in | Wt 167.0 lb

## 2012-11-23 DIAGNOSIS — R918 Other nonspecific abnormal finding of lung field: Secondary | ICD-10-CM | POA: Diagnosis not present

## 2012-11-23 DIAGNOSIS — J189 Pneumonia, unspecified organism: Secondary | ICD-10-CM | POA: Insufficient documentation

## 2012-11-23 DIAGNOSIS — J449 Chronic obstructive pulmonary disease, unspecified: Secondary | ICD-10-CM | POA: Diagnosis not present

## 2012-11-23 NOTE — Assessment & Plan Note (Signed)
The CT scan that we performed 2 weeks ago showed clear consolidation in the left lower lobe consistent with community acquired pneumonia. This cleared symptomatically with Levaquin.  We will need to get a CT scan in the future to ensure radiographic resolution.  He states that he's had pneumonia at least 4 or 5 times. This raises concern for aspiration. He says he thinks he's had a barium swallow at [place].  Plan:  -obtain records of the swallow evaluation from [facility]  -Chest CT in 3 months to ensure resolution of pneumonia.

## 2012-11-23 NOTE — Patient Instructions (Signed)
Take the Brovana and Pulmicort nebulizers in addition to the Spiriva  When it is OK with Dr. Mariah Milling, start exercising on a regular basis. Make it a goal to walk 25-30 minutes daily  We will repeat your CT chest in three months to make sure the pneumonia has cleared up.  We will see you back in 3 months or sooner if needed

## 2012-11-23 NOTE — Assessment & Plan Note (Signed)
Though [name] is doing much better now that his pneumonia has improved, his compliance with his inhalers as before. I explained to him today that it's important for him to take these to prevent further flares of his COPD. I also think that he has become deconditioned in the last several months due to his recurrent illnesses and so at he really needs to exercise more.  Plan:  -Encouraged regular exercise, consider pulmonary rehabilitation if he is not improving on his own. -Flu shot is up-to-date -Continue Brovana, Pulmicort neb, and Spiriva -Continue oxygen with exertion, we may be a little discontinue this on the next visit since I think the pneumonia was causing this.

## 2012-11-23 NOTE — Progress Notes (Signed)
Subjective:    Patient ID: [name], male    DOB: 1941/02/23, 71 y.o.   MRN: [id number]  Synopsis: [name] was first seen in the [facility] office in 10/2012 for COPD.  He had been admitted to the hospital for bronchitis in months prior.  His spirometry showed clear obstruction and an FEV1 of 48% predicted.  He also has a history of a CABG and Mitral valve replacement.  HPI   11/09/2012 ROV -- [name] says that he has not felt any better since he saw [place] last. He went to the [place] office for an acute visit and was given prednisone but he says it didn't help.  He is coughing up dark brown sputum in the mornings, not as much throughout the day.  He says that he has been noticing "gurgling" a lot while sitting or lying still.  He often has to get up in the middle of the night because of this.  He gets relief from the albuterol neb he has at home. He has been using the spiriva and the symbicort but he's not sure if they help.  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  He has not had chest pain and he has not had leg swelling.  He has not been choking on food or noticing food going down the wrong pipe.  No nausea or vomiting.  11/23/2012 ROV -- [name] has been doing great. Since the last visit he has had complete resolution of the cough, wheezing, and shortness of breath. He took all of the Levaquin and says that it started working within hours of the first pill. He has not been using the nebulized bronchodilator or inhaled steroid because he thought he didn't need it. He is taking the Spiriva. His flu shot is up-to-date. He does not exercise on a regular basis that he stays active at home. He is currently undergoing a workup for cardiac ischemia by Dr. Mariah Milling.  Past Medical History  Diagnosis Date  . Atrial fibrillation     chronic  . Hypertension   . Hyperkalemia   . Acute systolic heart failure   .  Coronary artery disease     Cath 2005- patent LIMA-LAD, SVG-OM grafts, moderate MR  . Prostate cancer 04/2009  . Hyperlipidemia   . COPD (chronic obstructive pulmonary disease)     URI 9/12  . DM type 2 (diabetes mellitus, type 2)   . PNA (pneumonia) 03/2010  . Glaucoma(365)   . Hard of hearing   . Myocardial infarction   . CHF (congestive heart failure)   . Gout   . Adenomatous polyps   . Diverticulosis     left side   . H/O mitral valve prolapse   . Sigmoid diverticulitis     perforated      Review of Systems  Constitutional: Positive for fatigue. Negative for fever and chills.  HENT: Negative for congestion, rhinorrhea and postnasal drip.   Respiratory: Positive for cough, shortness of breath and wheezing. Negative for choking.   Cardiovascular: Positive for palpitations. Negative for chest pain and leg swelling.  Gastrointestinal: Negative for nausea, abdominal pain, diarrhea, constipation, abdominal distention and rectal pain.       Objective:   Physical Exam   Filed Vitals:   11/23/12 0928  BP: 108/60  Pulse: 66  Temp: 97.4 F (36.3 C)  TempSrc: Oral  Height: 5'  7" (1.702 m)  Weight: 167 lb (75.751 kg)  SpO2: 98%   Dropped to 86% while walking on room air, improved with 2 L O2  Gen:  no acute distress HEENT: NCAT, PERRL, EOMi, OP clear PULM: Rhonchi loudest Left base, no wheezing, no clear crackles CV: Irreg Irreg, mechanical S1, no JVD AB: BS+, soft, nontender, no hsm Ext: warm, no edema, no clubbing, no cyanosis  10/25/2012 CXR >> cardiomegally, no clear infiltrate 10/25/2012 Spirometry >> clear obstruction on flow volume loop, FEV1 48% predicted      Assessment & Plan:   COPD (chronic obstructive pulmonary disease) Though [name] is doing much better now that his pneumonia has improved, his compliance with his inhalers as before. I explained to him today that it's important for him to take these to prevent further flares of his COPD. I also think  that he has become deconditioned in the last several months due to his recurrent illnesses and so at he really needs to exercise more.  Plan:  -Encouraged regular exercise, consider pulmonary rehabilitation if he is not improving on his own. -Flu shot is up-to-date -Continue Brovana, Pulmicort neb, and Spiriva -Continue oxygen with exertion, we may be a little discontinue this on the next visit since I think the pneumonia was causing this.  Pneumonia The CT scan that we performed 2 weeks ago showed clear consolidation in the left lower lobe consistent with community acquired pneumonia. This cleared symptomatically with Levaquin.  We will need to get a CT scan in the future to ensure radiographic resolution.  He states that he's had pneumonia at least 4 or 5 times. This raises concern for aspiration. He says he thinks he's had a barium swallow at [place].  Plan:  -obtain records of the swallow evaluation from [facility]  -Chest CT in 3 months to ensure resolution of pneumonia.    Updated Medication List Outpatient Encounter Prescriptions as of 11/23/2012  Medication Sig Dispense Refill  . carvedilol (COREG) 12.5 MG tablet Take 1.5 tablets (18.75 mg total) by mouth 2 (two) times daily with a meal.  270 tablet  3  . Cholecalciferol (VITAMIN D3) 1000 UNITS CAPS Take 1 capsule by mouth daily.        Marland Kitchen dextromethorphan-guaiFENesin (MUCINEX DM) 30-600 MG per 12 hr tablet Take 1 tablet by mouth every 12 (twelve) hours.      . digoxin (LANOXIN) 0.125 MG tablet Take 125 mcg by mouth daily.      . furosemide (LASIX) 20 MG tablet Take 20 mg by mouth daily.      Marland Kitchen latanoprost (XALATAN) 0.005 % ophthalmic solution Place 1 drop into both eyes at bedtime.  2.5 mL  1  . losartan (COZAAR) 100 MG tablet Take 0.5 tablets (50 mg total) by mouth daily.  90 tablet  3  . metFORMIN (GLUCOPHAGE) 500 MG tablet Take 500 mg by mouth 2 (two) times daily with a meal.       . OVER THE COUNTER  MEDICATION Take 1 tablet by mouth daily. Preservision Eye Vitamins      . simvastatin (ZOCOR) 40 MG tablet Take 40 mg by mouth at bedtime.       . Tamsulosin HCl (FLOMAX) 0.4 MG CAPS Take 0.4 mg by mouth daily.       Marland Kitchen tiotropium (SPIRIVA HANDIHALER) 18 MCG inhalation capsule Place 1 capsule (18 mcg total) into inhaler and inhale daily.  30 capsule  2  . warfarin (COUMADIN) 2.5 MG tablet Take  2.5 mg by mouth daily. Take 2.5 mg on Mon, Tues, Thurs,Fri, and Sun. Take 5 mg on Wed and Saturday.

## 2012-11-24 ENCOUNTER — Ambulatory Visit: Payer: Self-pay | Admitting: Cardiovascular Disease

## 2012-11-24 DIAGNOSIS — I2510 Atherosclerotic heart disease of native coronary artery without angina pectoris: Secondary | ICD-10-CM

## 2012-11-24 DIAGNOSIS — R948 Abnormal results of function studies of other organs and systems: Secondary | ICD-10-CM | POA: Diagnosis not present

## 2012-11-26 ENCOUNTER — Other Ambulatory Visit: Payer: Self-pay | Admitting: *Deleted

## 2012-11-26 MED ORDER — WARFARIN SODIUM 2.5 MG PO TABS: ORAL_TABLET | ORAL | Status: DC

## 2012-11-29 ENCOUNTER — Other Ambulatory Visit: Payer: Self-pay

## 2012-11-29 MED ORDER — DIGOXIN 125 MCG PO TABS: 125.0000 ug | ORAL_TABLET | Freq: Every day | ORAL | Status: DC

## 2012-11-29 NOTE — Telephone Encounter (Signed)
Refill sent for digoxin. 

## 2012-11-30 DIAGNOSIS — I10 Essential (primary) hypertension: Secondary | ICD-10-CM | POA: Diagnosis not present

## 2012-11-30 DIAGNOSIS — E119 Type 2 diabetes mellitus without complications: Secondary | ICD-10-CM | POA: Diagnosis not present

## 2012-12-01 ENCOUNTER — Ambulatory Visit (INDEPENDENT_AMBULATORY_CARE_PROVIDER_SITE_OTHER): Payer: Medicare Other

## 2012-12-01 DIAGNOSIS — I4891 Unspecified atrial fibrillation: Secondary | ICD-10-CM

## 2012-12-01 DIAGNOSIS — Z7901 Long term (current) use of anticoagulants: Secondary | ICD-10-CM | POA: Diagnosis not present

## 2012-12-01 DIAGNOSIS — I059 Rheumatic mitral valve disease, unspecified: Secondary | ICD-10-CM | POA: Diagnosis not present

## 2012-12-01 DIAGNOSIS — Z9889 Other specified postprocedural states: Secondary | ICD-10-CM

## 2012-12-03 ENCOUNTER — Other Ambulatory Visit: Payer: Self-pay | Admitting: Cardiovascular Disease

## 2012-12-06 ENCOUNTER — Other Ambulatory Visit: Payer: Self-pay

## 2012-12-06 MED ORDER — FUROSEMIDE 20 MG PO TABS: 20.0000 mg | ORAL_TABLET | Freq: Every day | ORAL | Status: DC

## 2012-12-06 NOTE — Telephone Encounter (Signed)
Refill sent for Lasix.  

## 2012-12-08 ENCOUNTER — Telehealth: Payer: Self-pay | Admitting: Pulmonary Disease

## 2012-12-08 MED ORDER — TIOTROPIUM BROMIDE MONOHYDRATE 18 MCG IN CAPS: 18.0000 ug | ORAL_CAPSULE | Freq: Every day | RESPIRATORY_TRACT | Status: DC

## 2012-12-08 NOTE — Telephone Encounter (Signed)
I spoke with pt and he is requesting sample of spiriva. i advised pt he would have to come to the [place] office. He stated that was fine. He is aware of our location. Samples have been left upfront for pick up. Nothing further was needed

## 2012-12-14 ENCOUNTER — Telehealth: Payer: Self-pay

## 2012-12-14 NOTE — Telephone Encounter (Signed)
Error

## 2012-12-23 ENCOUNTER — Ambulatory Visit (INDEPENDENT_AMBULATORY_CARE_PROVIDER_SITE_OTHER): Payer: Medicare Other

## 2012-12-23 DIAGNOSIS — Z9889 Other specified postprocedural states: Secondary | ICD-10-CM

## 2012-12-23 DIAGNOSIS — I4891 Unspecified atrial fibrillation: Secondary | ICD-10-CM

## 2012-12-23 DIAGNOSIS — I059 Rheumatic mitral valve disease, unspecified: Secondary | ICD-10-CM

## 2012-12-23 DIAGNOSIS — Z7901 Long term (current) use of anticoagulants: Secondary | ICD-10-CM | POA: Diagnosis not present

## 2012-12-23 LAB — POCT INR: INR: 2.4

## 2012-12-29 ENCOUNTER — Telehealth: Payer: Self-pay | Admitting: Pulmonary Disease

## 2012-12-29 NOTE — Telephone Encounter (Signed)
LMTCB for [name] 

## 2012-12-30 ENCOUNTER — Telehealth: Payer: Self-pay | Admitting: Pulmonary Disease

## 2012-12-30 MED ORDER — TIOTROPIUM BROMIDE MONOHYDRATE 18 MCG IN CAPS: 18.0000 ug | ORAL_CAPSULE | Freq: Every day | RESPIRATORY_TRACT | Status: DC

## 2012-12-30 NOTE — Telephone Encounter (Signed)
Pt aware RX has been sent. He needed nothing further.

## 2012-12-30 NOTE — Telephone Encounter (Signed)
I spoke with [place] and she states the way the sats are written in the chart is missing some info. It did not mention that saturations were while ambulating. I corrected this. Nothing further needed. Carron Curie, CMA

## 2013-01-10 ENCOUNTER — Other Ambulatory Visit: Payer: Self-pay | Admitting: *Deleted

## 2013-01-10 DIAGNOSIS — I2510 Atherosclerotic heart disease of native coronary artery without angina pectoris: Secondary | ICD-10-CM

## 2013-01-26 ENCOUNTER — Ambulatory Visit (INDEPENDENT_AMBULATORY_CARE_PROVIDER_SITE_OTHER): Payer: Medicare Other

## 2013-01-26 ENCOUNTER — Encounter: Payer: Self-pay | Admitting: Cardiovascular Disease

## 2013-01-26 ENCOUNTER — Ambulatory Visit (INDEPENDENT_AMBULATORY_CARE_PROVIDER_SITE_OTHER): Payer: Medicare Other | Admitting: Cardiovascular Disease

## 2013-01-26 VITALS — BP 118/78 | HR 59 | Ht 66.0 in | Wt 175.5 lb

## 2013-01-26 DIAGNOSIS — I509 Heart failure, unspecified: Secondary | ICD-10-CM | POA: Diagnosis not present

## 2013-01-26 DIAGNOSIS — I4891 Unspecified atrial fibrillation: Secondary | ICD-10-CM

## 2013-01-26 DIAGNOSIS — Z7901 Long term (current) use of anticoagulants: Secondary | ICD-10-CM | POA: Diagnosis not present

## 2013-01-26 DIAGNOSIS — I059 Rheumatic mitral valve disease, unspecified: Secondary | ICD-10-CM | POA: Diagnosis not present

## 2013-01-26 DIAGNOSIS — I10 Essential (primary) hypertension: Secondary | ICD-10-CM | POA: Diagnosis not present

## 2013-01-26 DIAGNOSIS — I5022 Chronic systolic (congestive) heart failure: Secondary | ICD-10-CM | POA: Diagnosis not present

## 2013-01-26 DIAGNOSIS — Z9889 Other specified postprocedural states: Secondary | ICD-10-CM

## 2013-01-26 DIAGNOSIS — E785 Hyperlipidemia, unspecified: Secondary | ICD-10-CM

## 2013-01-26 DIAGNOSIS — J449 Chronic obstructive pulmonary disease, unspecified: Secondary | ICD-10-CM

## 2013-01-26 DIAGNOSIS — I25810 Atherosclerosis of coronary artery bypass graft(s) without angina pectoris: Secondary | ICD-10-CM | POA: Diagnosis not present

## 2013-01-26 LAB — POCT INR: INR: 1.9

## 2013-01-26 NOTE — Assessment & Plan Note (Signed)
He looks great in clinic today. Probably the best I've seen him in some time. He reports responding well to Spiriva. Recovered from pneumonia. Encouraged him to start walking.

## 2013-01-26 NOTE — Assessment & Plan Note (Signed)
Continue statin. Goal LDL less than 70 

## 2013-01-26 NOTE — Assessment & Plan Note (Signed)
Doing well, will continue lasix. Need to recheck BMP

## 2013-01-26 NOTE — Assessment & Plan Note (Signed)
Chronic atrial fibrillation. Rate well controlled. On anticoagulation

## 2013-01-26 NOTE — Progress Notes (Signed)
Patient ID: [name], male    DOB: 10-27-1941, 72 y.o.   MRN: [id number]  HPI Comments: [name] is a very pleasant 72 year old gentleman with a past medical history of coronary artery disease, bypass surgery with a left internal mammary to the LAD and saphenous vein graft to the OM in 1989, PCI of the RCA in 2002, COPD, mitral valve replacement with prosthetic valve on Coumadin, history of chronic atrial fibrillation, diabetes, hypertension, hyperlipidemia, PNA in 03/2010, who presents for routine followup.   hospital in September 04 2011 with atrial fibrillation with RVR, chest pain, bronchitis, COPD exacerbation. He was treated with steroids, antibiotics, rate control for his atrial fibrillation with improvement of his symptoms.    hospital January 06 2012 with shortness of breath and congestion felt to have COPD exacerbation,  nebulizers, steroids. He suffered a perforated bowel with air under his diaphragm and was transferred to [facility] and within 48 hours, had surgery performed. He presents today with an ostomy. 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 He had a 24 pound weight loss through this procedure, open abdominal wall wound healing by secondary intention.  He was discharged with underlying anemia, hematocrit 27 INR of 4  He continues to have his ostomy. he has decided not to have this repaired.  Repeat echocardiogram for shortness of breath  starting in October showed a decline in his ejection fraction to 20-30%, worsening inferior, posterior and lateral wall hypokinesis (inferior and posterior wall are almost akinetic). Prior ejection fraction 40-45%.   Recent stress test late 2013 showing large region of scar in the inferior and inferolateral wall consistent with prior MI, no ischemia  Today he reports feeling well with no symptoms of shortness of breath. Pneumonia has resolved.  Overall feels very happy. Started on Spiriva with good improvement in shortness of breath. He is taking diuretic daily. Recent blood work September 2013, none since then   EKG shows atrial fibrillation with rate 59 beats a minute, nonspecific ST abnormality, right bundle branch block, old inferior MI     Outpatient Encounter Prescriptions as of 01/26/2013  Medication Sig Dispense Refill  . carvedilol (COREG) 12.5 MG tablet Take 1.5 tablets (18.75 mg total) by mouth 2 (two) times daily with a meal.  270 tablet  3  . Cholecalciferol (VITAMIN D3) 1000 UNITS CAPS Take 1 capsule by mouth daily.        Marland Kitchen dextromethorphan-guaiFENesin (MUCINEX DM) 30-600 MG per 12 hr tablet Take 1 tablet by mouth every 12 (twelve) hours.      . digoxin (LANOXIN) 0.125 MG tablet Take 1 tablet (125 mcg total) by mouth daily.  90 tablet  3  . furosemide (LASIX) 20 MG tablet Take 1 tablet (20 mg total) by mouth daily.  90 tablet  3  . latanoprost (XALATAN) 0.005 % ophthalmic solution Place 1 drop into both eyes at bedtime.  2.5 mL  1  . losartan (COZAAR) 100 MG tablet Take 0.5 tablets (50 mg total) by mouth daily.  90 tablet  3  . metFORMIN (GLUCOPHAGE) 500 MG tablet Take 500 mg by mouth 2 (two) times daily with a meal.       . OVER THE COUNTER MEDICATION Take 1 tablet by mouth daily. Preservision Eye Vitamins      . simvastatin (ZOCOR) 40 MG tablet Take 40 mg by mouth at bedtime.       . Tamsulosin HCl (  FLOMAX) 0.4 MG CAPS Take 0.4 mg by mouth daily.       Marland Kitchen tiotropium (SPIRIVA HANDIHALER) 18 MCG inhalation capsule Place 1 capsule (18 mcg total) into inhaler and inhale daily.  90 capsule  3  . warfarin (COUMADIN) 2.5 MG tablet Take as directed by coumadin clinic  120 tablet  1  . [DISCONTINUED] arformoterol (BROVANA) 15 MCG/2ML NEBU Take 15 mcg by nebulization 2 (two) times daily.      . [DISCONTINUED] budesonide (PULMICORT) 0.25 MG/2ML nebulizer solution Take 0.25 mg by nebulization 2 (two) times daily.        Review of  Systems  Constitutional: Negative.   HENT: Negative.        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   Cardiovascular: Negative.   Gastrointestinal: Negative.   Musculoskeletal: Negative.   Skin: Negative.   Neurological: Negative.   Hematological: Negative.   Psychiatric/Behavioral: Negative.   All other systems reviewed and are negative.   BP 118/78  Pulse 59  Ht 5\' 6"  (1.676 m)  Wt 175 lb 8 oz (79.606 kg)  BMI 28.33 kg/m2  Physical Exam  Nursing note and vitals reviewed. Constitutional: He is oriented to person, place, and time. He appears well-developed and well-nourished.  HENT:  Head: Normocephalic.  Nose: Nose normal.  Mouth/Throat: Oropharynx is clear and moist.  Eyes: Conjunctivae normal are normal. Pupils are equal, round, and reactive to light.  Neck: Normal range of motion. Neck supple. No JVD present.  Cardiovascular: Normal rate, S1 normal, S2 normal and intact distal pulses.  An irregularly irregular rhythm present. Exam reveals no gallop and no friction rub.   Murmur heard.  Crescendo systolic murmur is present with a grade of 2/6  Pulmonary/Chest: Effort normal. No respiratory distress. He has decreased breath sounds. He has no wheezes. He has no rales. He exhibits no tenderness.  Abdominal: Soft. Bowel sounds are normal. He exhibits no distension. There is no tenderness.       Ostomy in place  Musculoskeletal: Normal range of motion. He exhibits no edema and no tenderness.  Lymphadenopathy:    He has no cervical adenopathy.  Neurological: He is alert and oriented to person, place, and time. Coordination normal.  Skin: Skin is warm and dry. No rash noted. No erythema.  Psychiatric: He has a normal mood and affect. His behavior is normal. Judgment and thought content normal.           Assessment and Plan

## 2013-01-26 NOTE — Assessment & Plan Note (Signed)
Currently with no symptoms of angina. No further workup at this time. Continue current medication regimen. Recent stress test showing large region of scar in the inferior and inferolateral wall. No significant ischemia. Currently feeling well.

## 2013-01-26 NOTE — Assessment & Plan Note (Signed)
Blood pressure is well controlled on today's visit. No changes made to the medications. 

## 2013-01-26 NOTE — Patient Instructions (Addendum)
You are doing well. No medication changes were made.  We will check lab work today  Please call us if you have new issues that need to be addressed before your next appt.  Your physician wants you to follow-up in: 6 months.  You will receive a reminder letter in the mail two months in advance. If you don't receive a letter, please call our office to schedule the follow-up appointment. 

## 2013-01-27 LAB — BASIC METABOLIC PANEL
BUN: 18 mg/dL (ref 8–27)
CO2: 21 mmol/L (ref 19–28)
Calcium: 9.5 mg/dL (ref 8.6–10.2)
Chloride: 104 mmol/L (ref 97–108)
Glucose: 128 mg/dL — ABNORMAL HIGH (ref 65–99)
Potassium: 4.9 mmol/L (ref 3.5–5.2)

## 2013-02-07 ENCOUNTER — Telehealth: Payer: Self-pay

## 2013-02-07 NOTE — Telephone Encounter (Signed)
Message copied by Festus Aloe on Mon Feb 07, 2013  2:33 PM ------      Message from: Antonieta Iba      Created: Wed Nov 24, 2012  5:47 PM      Regarding: old stress test       Can we find his old stress test from 2009, done in [place] at [place]. It is not in the system.      Let me know if you find it. I need to call him back ASAP.      thx      tim ------

## 2013-02-09 DIAGNOSIS — IMO0002 Reserved for concepts with insufficient information to code with codable children: Secondary | ICD-10-CM | POA: Diagnosis not present

## 2013-02-09 DIAGNOSIS — R339 Retention of urine, unspecified: Secondary | ICD-10-CM | POA: Diagnosis not present

## 2013-02-09 DIAGNOSIS — C61 Malignant neoplasm of prostate: Secondary | ICD-10-CM | POA: Diagnosis not present

## 2013-02-09 DIAGNOSIS — N139 Obstructive and reflux uropathy, unspecified: Secondary | ICD-10-CM | POA: Diagnosis not present

## 2013-02-16 ENCOUNTER — Ambulatory Visit (INDEPENDENT_AMBULATORY_CARE_PROVIDER_SITE_OTHER): Payer: Medicare Other

## 2013-02-16 DIAGNOSIS — Z9889 Other specified postprocedural states: Secondary | ICD-10-CM

## 2013-02-16 DIAGNOSIS — I4891 Unspecified atrial fibrillation: Secondary | ICD-10-CM

## 2013-02-16 DIAGNOSIS — Z7901 Long term (current) use of anticoagulants: Secondary | ICD-10-CM

## 2013-02-16 DIAGNOSIS — I059 Rheumatic mitral valve disease, unspecified: Secondary | ICD-10-CM

## 2013-02-16 LAB — POCT INR: INR: 3.2

## 2013-02-22 ENCOUNTER — Other Ambulatory Visit: Payer: Self-pay | Admitting: *Deleted

## 2013-02-22 ENCOUNTER — Ambulatory Visit: Payer: Self-pay | Admitting: Pulmonary Disease

## 2013-02-22 DIAGNOSIS — R599 Enlarged lymph nodes, unspecified: Secondary | ICD-10-CM | POA: Diagnosis not present

## 2013-02-22 DIAGNOSIS — Z954 Presence of other heart-valve replacement: Secondary | ICD-10-CM | POA: Diagnosis not present

## 2013-02-22 DIAGNOSIS — I517 Cardiomegaly: Secondary | ICD-10-CM | POA: Diagnosis not present

## 2013-02-22 DIAGNOSIS — R918 Other nonspecific abnormal finding of lung field: Secondary | ICD-10-CM | POA: Diagnosis not present

## 2013-02-22 DIAGNOSIS — I10 Essential (primary) hypertension: Secondary | ICD-10-CM

## 2013-02-22 MED ORDER — CARVEDILOL 12.5 MG PO TABS: 18.7500 mg | ORAL_TABLET | Freq: Two times a day (BID) | ORAL | Status: DC

## 2013-02-22 MED ORDER — LOSARTAN POTASSIUM 100 MG PO TABS: 50.0000 mg | ORAL_TABLET | Freq: Every day | ORAL | Status: DC

## 2013-03-01 ENCOUNTER — Other Ambulatory Visit: Payer: Self-pay | Admitting: Pulmonary Disease

## 2013-03-01 ENCOUNTER — Encounter: Payer: Self-pay | Admitting: Pulmonary Disease

## 2013-03-01 ENCOUNTER — Ambulatory Visit (INDEPENDENT_AMBULATORY_CARE_PROVIDER_SITE_OTHER): Payer: Medicare Other | Admitting: Pulmonary Disease

## 2013-03-01 VITALS — BP 120/74 | HR 55 | Temp 97.3°F | Ht 66.0 in | Wt 174.8 lb

## 2013-03-01 DIAGNOSIS — J189 Pneumonia, unspecified organism: Secondary | ICD-10-CM | POA: Diagnosis not present

## 2013-03-01 DIAGNOSIS — R599 Enlarged lymph nodes, unspecified: Secondary | ICD-10-CM

## 2013-03-01 DIAGNOSIS — J449 Chronic obstructive pulmonary disease, unspecified: Secondary | ICD-10-CM

## 2013-03-01 DIAGNOSIS — R590 Localized enlarged lymph nodes: Secondary | ICD-10-CM | POA: Insufficient documentation

## 2013-03-01 NOTE — Assessment & Plan Note (Addendum)
He has two enlarge lymph nodes in his chest despite his clearing pnuemonia.  His 4R station and 7 are enlarged.  I am able to compare the images from [facility] in 11/2012 with the images from [facility] 02/2013.  The nodes have not changed when measured in both short and long axis.  Unclear etiology, related to prior infection? He has a prior history of prostate cancer but this would be an unusual site for metastatic disease particularly considering his good response to XRT.  Clearly the ddx includes other causes of mediastinal lymphadenopathy> sarcoid, lymphoma, Castleman's disease, etc.  We discussed the options (persuing biopsy now, repeat imaging).  We have decided to perform a 3 month follow up CT chest to see if they have changed.  If they are persistent, then he would need a EBUS guided FNA of the 4R lymph node.

## 2013-03-01 NOTE — Assessment & Plan Note (Signed)
Most recent episode has resolved.  I am concerned about aspiration given the fact that he has had recurrent pneumonia in the last few years.  Plan: -modified barium swallow

## 2013-03-01 NOTE — Patient Instructions (Signed)
Keep using your spiriva as you are doing, but start rinsing your mouth out after you use it Use Biotine mouth rinse at night to help with the dry mouth We will have you get a Modified Barium Swallow test to make sure that you are not aspirating food (because you had recurrent pneumonia, this is a concern)  We will see you back in 4-6 months or sooner if needed

## 2013-03-01 NOTE — Assessment & Plan Note (Signed)
Severe COPD, doing great on spiriva daily.  He is having difficulty paying for the Spiriva. It may be causing some dry mouth at night because he doesn't rinse his mouth after he uses it.  Plan: -apply for patient assistance for him -walk today to ensure no need for supplemental O2 -rinse mouth after using Spiriva -f/u with [place] in 4-6 months

## 2013-03-01 NOTE — Progress Notes (Signed)
Subjective:    Patient ID: [name], male    DOB: 1941-06-16, 72 y.o.   MRN: [id number]  Synopsis: [name] was first seen in the [facility] office in 10/2012 for COPD.  He had been admitted to the hospital for bronchitis in months prior.  His spirometry showed clear obstruction and an FEV1 of 48% predicted.  He also has a history of a CABG and Mitral valve replacement.  HPI   11/09/2012 ROV -- [name] says that he has not felt any better since he saw [place] last. He went to the [place] office for an acute visit and was given prednisone but he says it didn't help.  He is coughing up dark brown sputum in the mornings, not as much throughout the day.  He says that he has been noticing "gurgling" a lot while sitting or lying still.  He often has to get up in the middle of the night because of this.  He gets relief from the albuterol neb he has at home. He has been using the spiriva and the symbicort but he's not sure if they help.  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  He has not had chest pain and he has not had leg swelling.  He has not been choking on food or noticing food going down the wrong pipe.  No nausea or vomiting.  11/23/2012 ROV -- [name] has been doing great. Since the last visit he has had complete resolution of the cough, wheezing, and shortness of breath. He took all of the Levaquin and says that it started working within hours of the first pill. He has not been using the nebulized bronchodilator or inhaled steroid because he thought he didn't need it. He is taking the Spiriva. His flu shot is up-to-date. He does not exercise on a regular basis that he stays active at home. He is currently undergoing a workup for cardiac ischemia by Dr. Mariah Milling.  03/01/2013 ROV -- [name] is doing very well.  Since our last visit he has been breathing much better and is now only using spiriva daily.  He says  that this drug really makes a difference for him.  He is having difficulty paying for it however.  He also notes some dry mouth at night and cough in the mornings.  No sinus symptoms.  He is not using his oxygen.  No weight change, chest pain.    Past Medical History  Diagnosis Date  . Atrial fibrillation     chronic  . Hypertension   . Hyperkalemia   . Acute systolic heart failure   . Coronary artery disease     Cath 2005- patent LIMA-LAD, SVG-OM grafts, moderate MR  . Prostate cancer 04/2009  . Hyperlipidemia   . COPD (chronic obstructive pulmonary disease)     URI 9/12  . DM type 2 (diabetes mellitus, type 2)   . PNA (pneumonia) 03/2010  . Glaucoma(365)   . Hard of hearing   . Myocardial infarction   . CHF (congestive heart failure)   . Gout   . Adenomatous polyps   . Diverticulosis     left side   . H/O mitral valve prolapse   . Sigmoid diverticulitis     perforated      Review of Systems  Constitutional: Positive for fatigue. Negative for fever and chills.  HENT: Negative for congestion,  rhinorrhea and postnasal drip.   Respiratory: Positive for cough, shortness of breath and wheezing. Negative for choking.   Cardiovascular: Positive for palpitations. Negative for chest pain and leg swelling.  Gastrointestinal: Negative for nausea, abdominal pain, diarrhea, constipation, abdominal distention and rectal pain.       Objective:   Physical Exam   Filed Vitals:   03/01/13 0925  BP: 120/74  Pulse: 55  Temp: 97.3 F (36.3 C)  TempSrc: Oral  Height: 5\' 6"  (1.676 m)  Weight: 174 lb 12.8 oz (79.289 kg)  SpO2: 93%   Dropped to 86% while walking on room air, improved with 2 L O2  Gen:  no acute distress HEENT: NCAT, TM's clear, EOMi, OP clear PULM: CTA B CV: Irreg Irreg, mechanical S1, no JVD AB: BS+, soft, nontender, no hsm Ext: warm, no edema, no clubbing, no cyanosis  10/25/2012 CXR >> cardiomegally, no clear infiltrate 10/25/2012 Spirometry >> clear  obstruction on flow volume loop, FEV1 48% predicted 11/2012 CT chest >> LLL pneumonia, enlarged mediastinal lymph nodes 02/22/2013 CT chest [facility] >> complete resolution of pneumonia; mediastinal lymphadenopathy persists, 1.7 cm 4R node, 1.0cm 7 node      Assessment & Plan:   COPD (chronic obstructive pulmonary disease) Severe COPD, doing great on spiriva daily.  He is having difficulty paying for the Spiriva. It may be causing some dry mouth at night because he doesn't rinse his mouth after he uses it.  Plan: -apply for patient assistance for him -walk today to ensure no need for supplemental O2 -rinse mouth after using Spiriva -f/u with [place] in 4-6 months  Pneumonia Most recent episode has resolved.  I am concerned about aspiration given the fact that he has had recurrent pneumonia in the last few years.  Plan: -modified barium swallow  Mediastinal lymphadenopathy He has two enlarge lymph nodes in his chest despite his clearing pnuemonia.  His 4R station and 7 are enlarged.  I am able to compare the images from [facility] in 11/2012 with the images from [facility] 02/2013.  The nodes have not changed when measured in both short and long axis.  Unclear etiology, related to prior infection? He has a prior history of prostate cancer but this would be an unusual site for metastatic disease particularly considering his good response to XRT.  Clearly the ddx includes other causes of mediastinal lymphadenopathy> sarcoid, lymphoma, Castleman's disease, etc.  We discussed the options (persuing biopsy now, repeat imaging).  We have decided to perform a 3 month follow up CT chest to see if they have changed.  If they are persistent, then he would need a EBUS guided FNA of the 4R lymph node.    Updated Medication List Outpatient Encounter Prescriptions as of 03/01/2013  Medication Sig Dispense Refill  . carvedilol (COREG) 12.5 MG tablet Take 1.5 tablets (18.75 mg total) by mouth 2 (two) times daily with a meal.   270 tablet  3  . dextromethorphan-guaiFENesin (MUCINEX DM) 30-600 MG per 12 hr tablet Take 1 tablet by mouth every 12 (twelve) hours.      . digoxin (LANOXIN) 0.125 MG tablet Take 1 tablet (125 mcg total) by mouth daily.  90 tablet  3  . furosemide (LASIX) 20 MG tablet Take 1 tablet (20 mg total) by mouth daily.  90 tablet  3  . losartan (COZAAR) 100 MG tablet Take 0.5 tablets (50 mg total) by mouth daily.  90 tablet  3  . metFORMIN (GLUCOPHAGE) 500 MG tablet Take 500 mg  by mouth 2 (two) times daily with a meal.       . OVER THE COUNTER MEDICATION Take 1 tablet by mouth daily. Preservision Eye Vitamins      . simvastatin (ZOCOR) 40 MG tablet Take 40 mg by mouth at bedtime.       . Tamsulosin HCl (FLOMAX) 0.4 MG CAPS Take 0.4 mg by mouth daily.       Marland Kitchen tiotropium (SPIRIVA HANDIHALER) 18 MCG inhalation capsule Place 1 capsule (18 mcg total) into inhaler and inhale daily.  90 capsule  3  . warfarin (COUMADIN) 2.5 MG tablet Take as directed by coumadin clinic  120 tablet  1  . Cholecalciferol (VITAMIN D3) 1000 UNITS CAPS Take 1 capsule by mouth daily.         No facility-administered encounter medications on file as of 03/01/2013.

## 2013-03-08 ENCOUNTER — Ambulatory Visit: Payer: Self-pay | Admitting: Pulmonary Disease

## 2013-03-08 DIAGNOSIS — R1310 Dysphagia, unspecified: Secondary | ICD-10-CM | POA: Diagnosis not present

## 2013-03-14 ENCOUNTER — Encounter: Payer: Self-pay | Admitting: Pulmonary Disease

## 2013-03-14 DIAGNOSIS — T17908A Unspecified foreign body in respiratory tract, part unspecified causing other injury, initial encounter: Secondary | ICD-10-CM | POA: Insufficient documentation

## 2013-03-15 ENCOUNTER — Encounter: Payer: Self-pay | Admitting: Pulmonary Disease

## 2013-03-16 ENCOUNTER — Ambulatory Visit: Payer: Medicare Other | Admitting: Cardiovascular Disease

## 2013-03-16 ENCOUNTER — Ambulatory Visit (INDEPENDENT_AMBULATORY_CARE_PROVIDER_SITE_OTHER): Payer: Medicare Other

## 2013-03-16 DIAGNOSIS — I059 Rheumatic mitral valve disease, unspecified: Secondary | ICD-10-CM | POA: Diagnosis not present

## 2013-03-16 DIAGNOSIS — Z7901 Long term (current) use of anticoagulants: Secondary | ICD-10-CM

## 2013-03-16 DIAGNOSIS — Z9889 Other specified postprocedural states: Secondary | ICD-10-CM

## 2013-03-16 DIAGNOSIS — I4891 Unspecified atrial fibrillation: Secondary | ICD-10-CM | POA: Diagnosis not present

## 2013-04-04 ENCOUNTER — Encounter: Payer: Self-pay | Admitting: Pulmonary Disease

## 2013-04-13 DIAGNOSIS — E119 Type 2 diabetes mellitus without complications: Secondary | ICD-10-CM | POA: Diagnosis not present

## 2013-04-13 DIAGNOSIS — Z79899 Other long term (current) drug therapy: Secondary | ICD-10-CM | POA: Diagnosis not present

## 2013-04-13 DIAGNOSIS — E785 Hyperlipidemia, unspecified: Secondary | ICD-10-CM | POA: Diagnosis not present

## 2013-04-21 DIAGNOSIS — I10 Essential (primary) hypertension: Secondary | ICD-10-CM | POA: Diagnosis not present

## 2013-04-21 DIAGNOSIS — I4891 Unspecified atrial fibrillation: Secondary | ICD-10-CM | POA: Diagnosis not present

## 2013-04-21 DIAGNOSIS — E119 Type 2 diabetes mellitus without complications: Secondary | ICD-10-CM | POA: Diagnosis not present

## 2013-04-21 DIAGNOSIS — E785 Hyperlipidemia, unspecified: Secondary | ICD-10-CM | POA: Diagnosis not present

## 2013-04-27 ENCOUNTER — Ambulatory Visit (INDEPENDENT_AMBULATORY_CARE_PROVIDER_SITE_OTHER): Payer: Medicare Other

## 2013-04-27 DIAGNOSIS — Z7901 Long term (current) use of anticoagulants: Secondary | ICD-10-CM | POA: Diagnosis not present

## 2013-04-27 DIAGNOSIS — I4891 Unspecified atrial fibrillation: Secondary | ICD-10-CM | POA: Diagnosis not present

## 2013-04-27 DIAGNOSIS — Z9889 Other specified postprocedural states: Secondary | ICD-10-CM

## 2013-04-27 DIAGNOSIS — I059 Rheumatic mitral valve disease, unspecified: Secondary | ICD-10-CM

## 2013-05-02 DIAGNOSIS — H4010X0 Unspecified open-angle glaucoma, stage unspecified: Secondary | ICD-10-CM | POA: Diagnosis not present

## 2013-05-09 DIAGNOSIS — H353290 Exudative age-related macular degeneration, unspecified eye, stage unspecified: Secondary | ICD-10-CM | POA: Diagnosis not present

## 2013-06-01 ENCOUNTER — Ambulatory Visit (INDEPENDENT_AMBULATORY_CARE_PROVIDER_SITE_OTHER): Payer: Medicare Other

## 2013-06-01 DIAGNOSIS — Z7901 Long term (current) use of anticoagulants: Secondary | ICD-10-CM | POA: Diagnosis not present

## 2013-06-01 DIAGNOSIS — Z9889 Other specified postprocedural states: Secondary | ICD-10-CM | POA: Diagnosis not present

## 2013-06-01 DIAGNOSIS — I4891 Unspecified atrial fibrillation: Secondary | ICD-10-CM | POA: Diagnosis not present

## 2013-06-01 DIAGNOSIS — I059 Rheumatic mitral valve disease, unspecified: Secondary | ICD-10-CM | POA: Diagnosis not present

## 2013-06-01 DIAGNOSIS — H353290 Exudative age-related macular degeneration, unspecified eye, stage unspecified: Secondary | ICD-10-CM | POA: Diagnosis not present

## 2013-06-08 ENCOUNTER — Other Ambulatory Visit: Payer: Self-pay | Admitting: Cardiology

## 2013-06-09 NOTE — Telephone Encounter (Signed)
Please review and refill, Thank You. 

## 2013-06-17 IMAGING — CR DG CHEST 1V PORT
1 series · 1 of 1 positions shown · non-contrast
Comparison: none

REASON FOR EXAM: hypoxia
COMMENTS:

PROCEDURE:     DXR - DXR PORTABLE CHEST SINGLE VIEW  - January 06, 2012  [DATE]
RESULT:     Comparison: 01/05/2012

[pa]
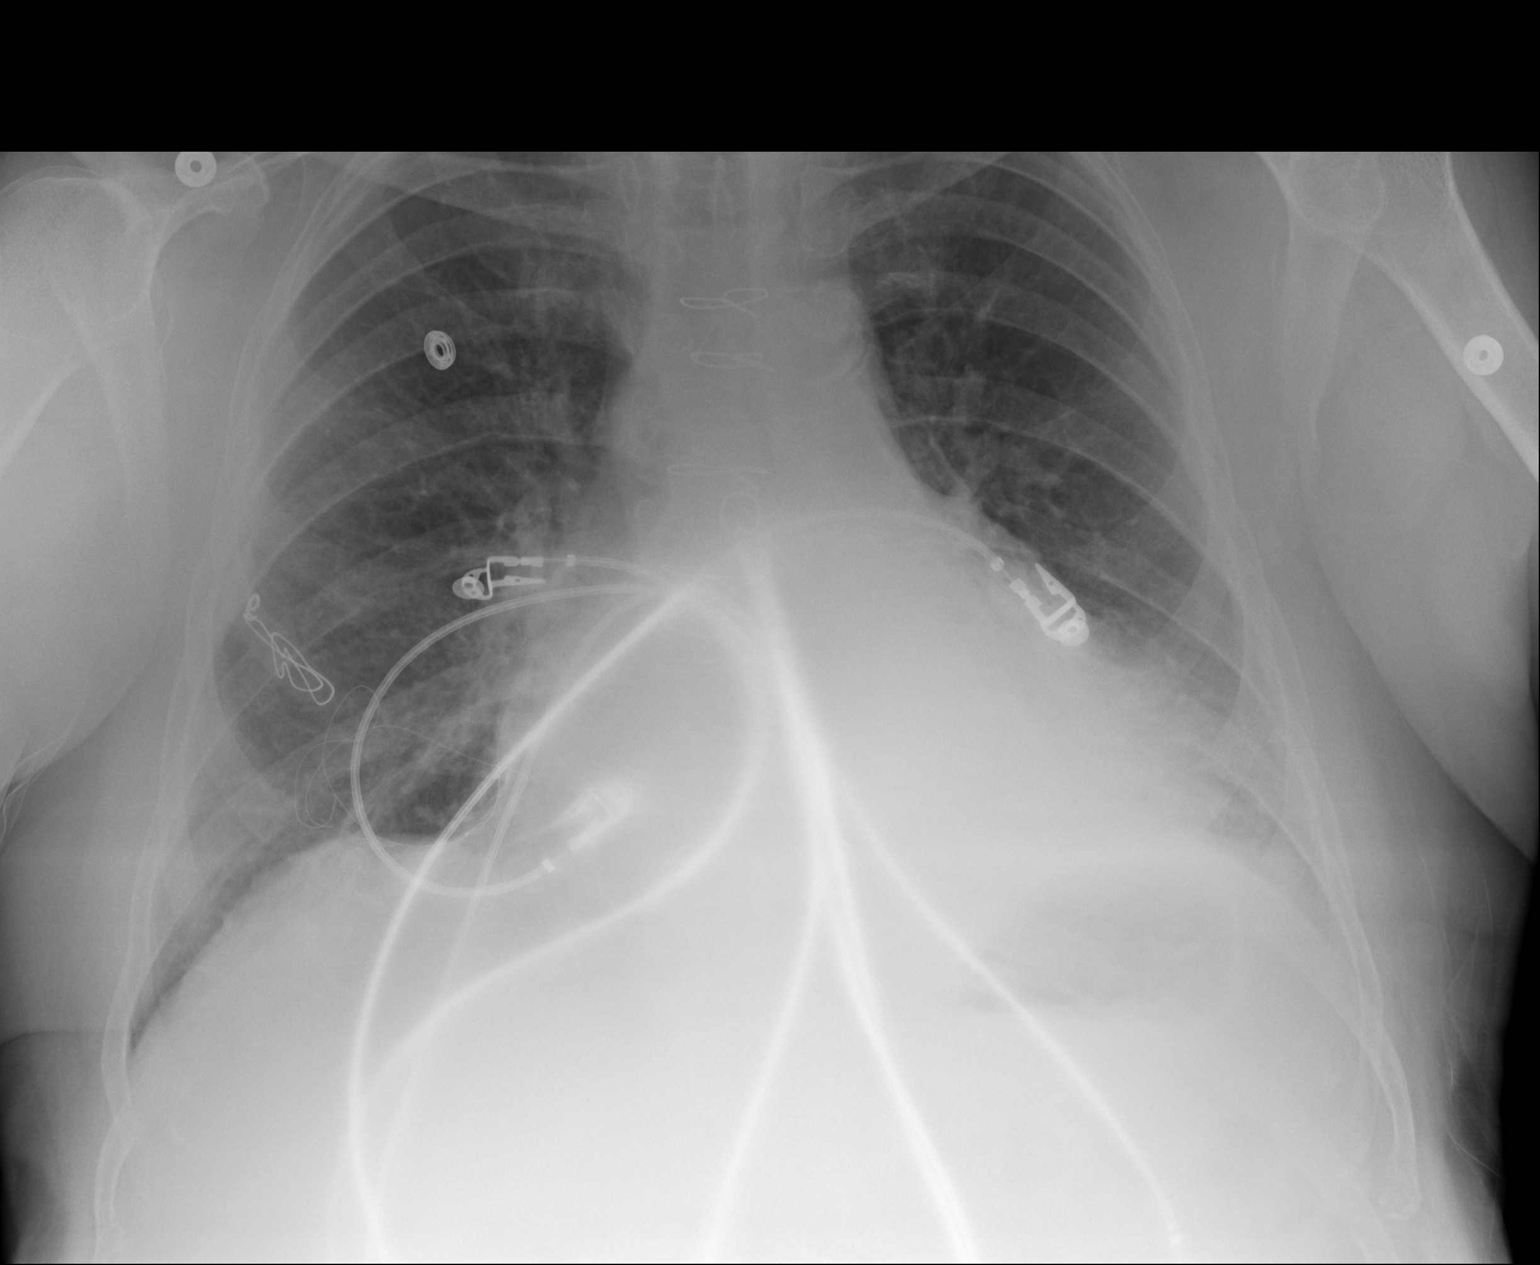

[1 of 1 positions shown; findings below may reference images not displayed]

FINDINGS: Cardiomegaly and the mediastinum are similar to prior. Prior median
sternotomy and CABG. There are mild interstitial pulmonary opacities, likely
secondary to interstitial edema. Post surgical changes seen along the right
hemithorax.
IMPRESSION: Cardiomegaly, with findings suggesting mild interstitial edema.

## 2013-06-20 IMAGING — CT CT ABD-PELV W/O CM
1 of 2 series · 14 of 32 positions shown, 18 images · non-contrast
Comparison: none

REASON FOR EXAM: (1) pain in lower abdomen, free  air under diaphragm on
todays xray, per report;
COMMENTS:

PROCEDURE:     CT  - CT ABDOMEN AND PELVIS W[DATE]  [DATE]
RESULT:     Comparison: Chest radiograph performed same day.
TECHNIQUE: Multiple axial images from the lung bases to the symphysis pubis
were obtained without oral and without intravenous contrast.

[Series 2: 3mm soft tissue · axial · 0.78mm/px · z∈[-456,-57]mm · 14 of 153 slices shown, 18 images]
[im 13/153  soft-tissue]
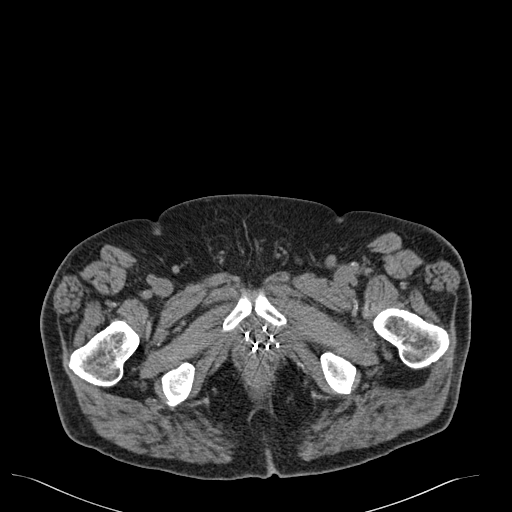
[im 13/153  bone]
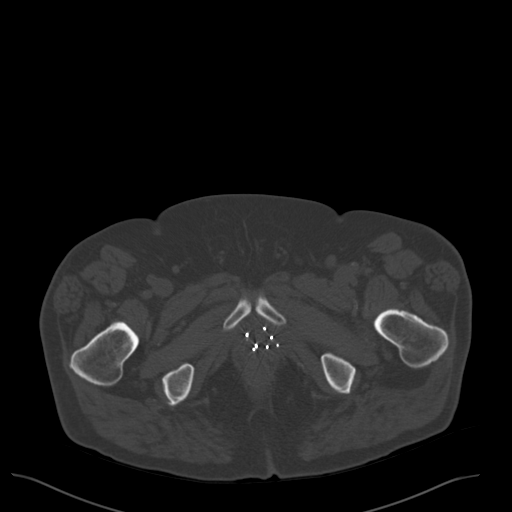
[im 25/153  soft-tissue]
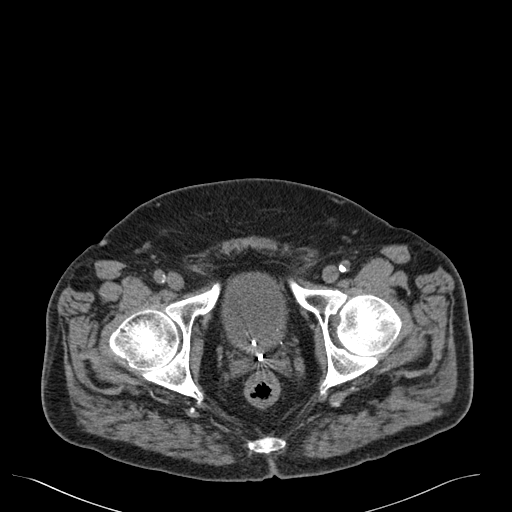
[im 37/153  soft-tissue]
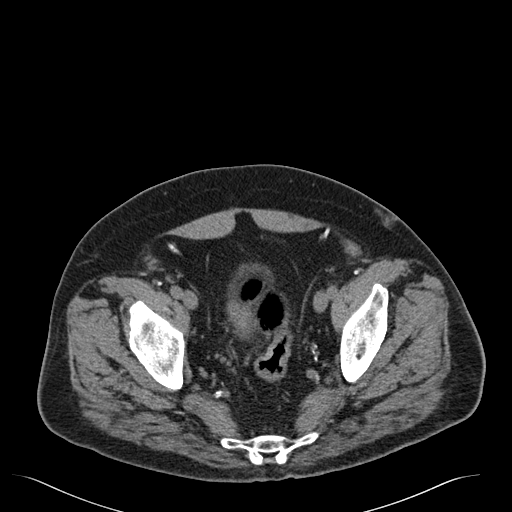
[im 49/153  soft-tissue]
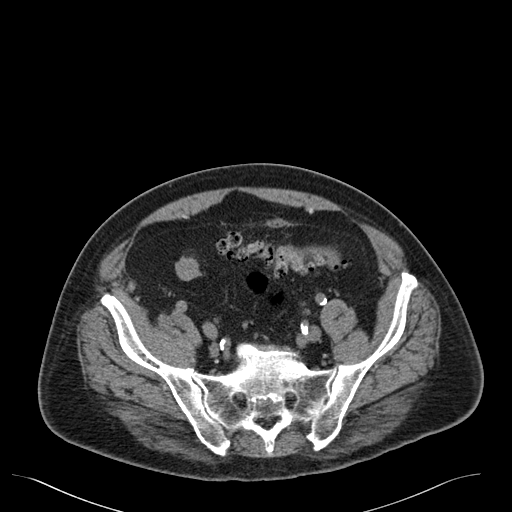
[im 61/153  soft-tissue]
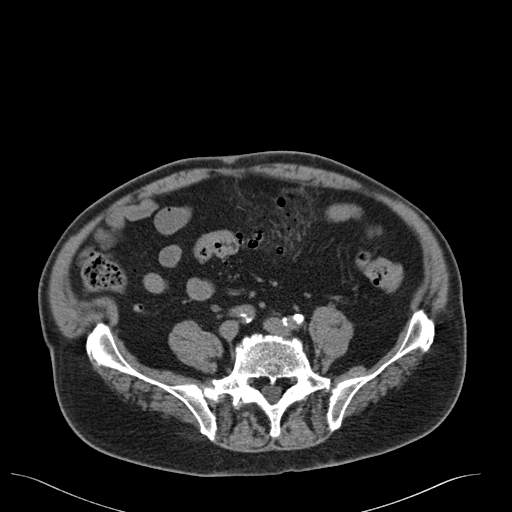
[im 73/153  soft-tissue]
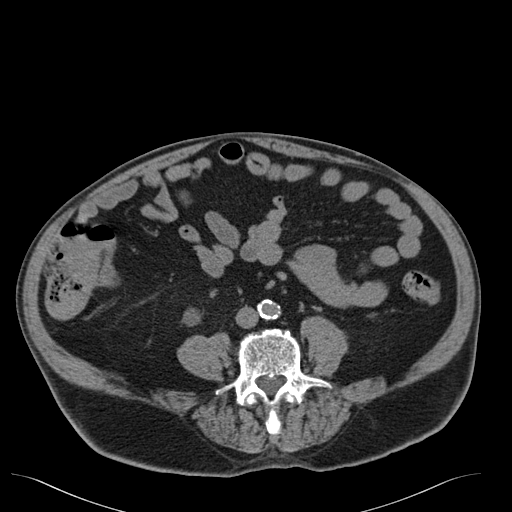
[im 86/153  soft-tissue]
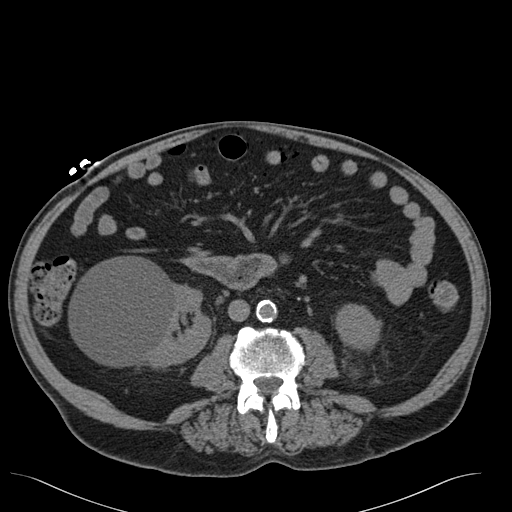
[im 98/153  soft-tissue]
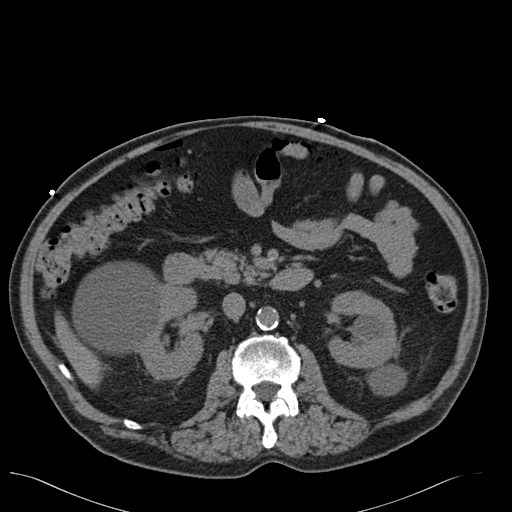
[im 110/153  soft-tissue]
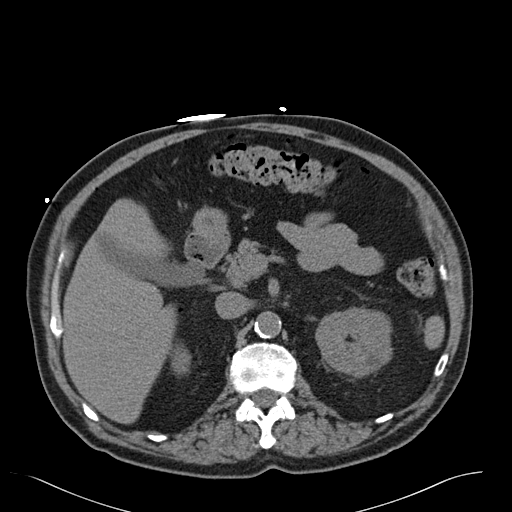
[im 110/153  bone]
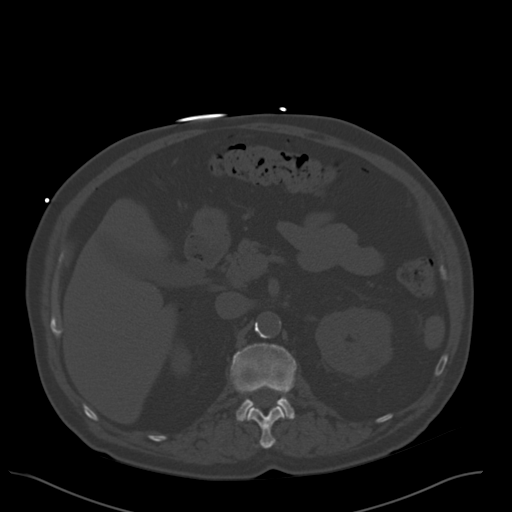
[im 122/153  soft-tissue]
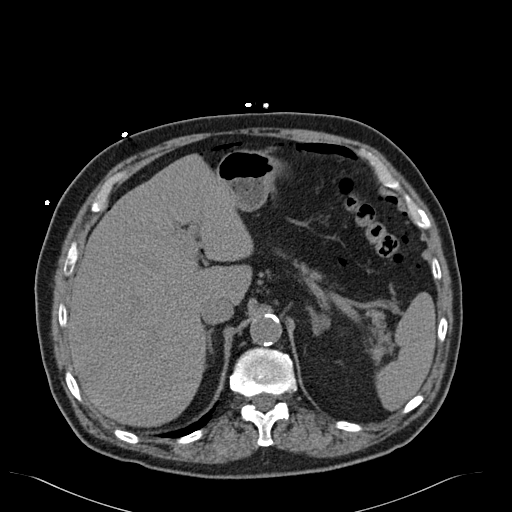
[im 128/153  lung]
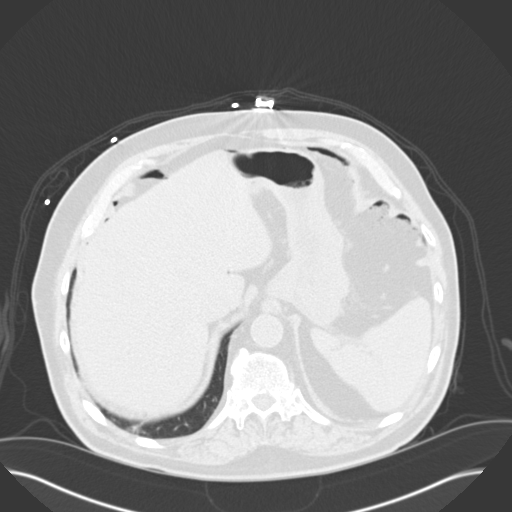
[im 134/153  soft-tissue]
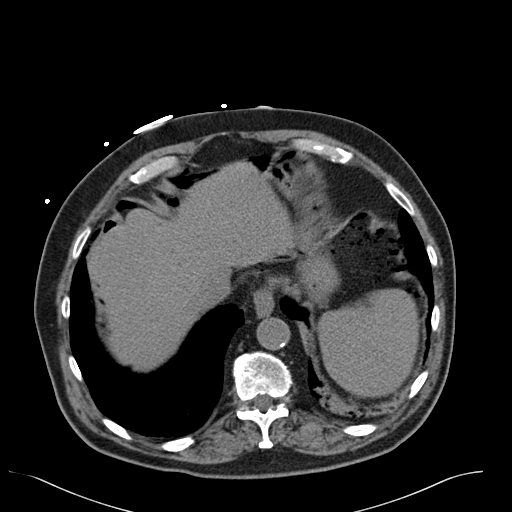
[im 134/153  lung]
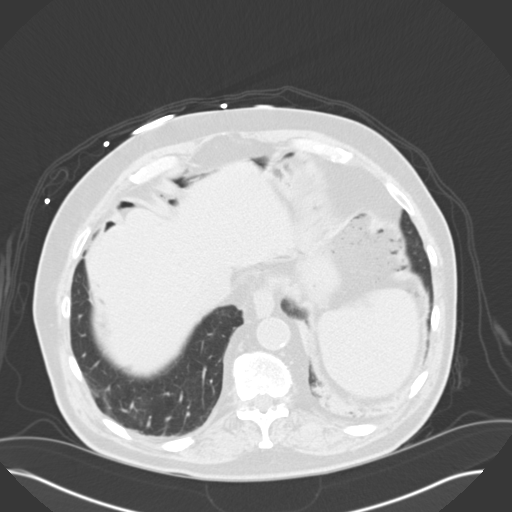
[im 140/153  lung]
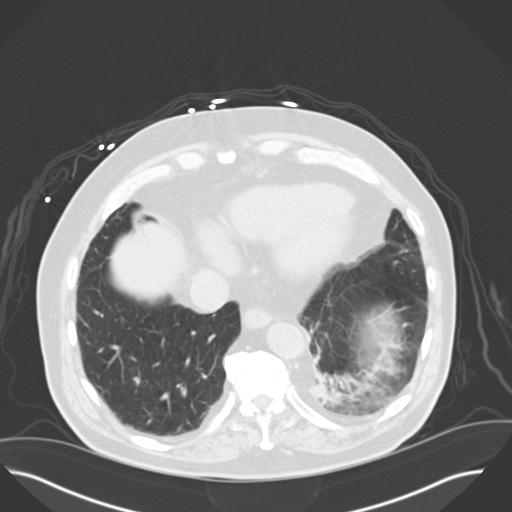
[im 146/153  soft-tissue]
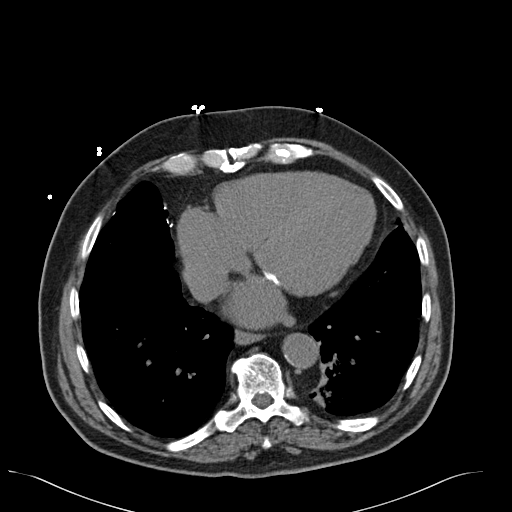
[im 146/153  lung]
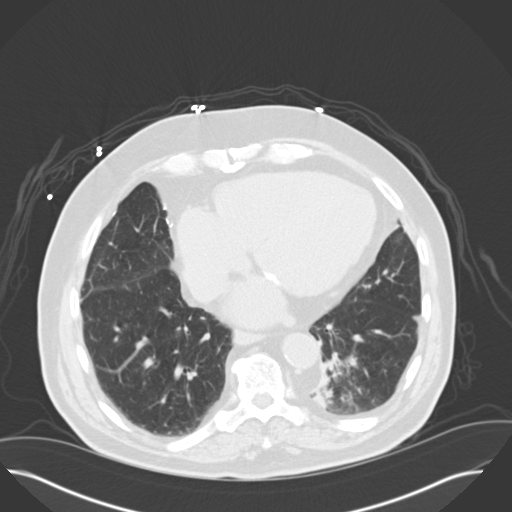

[14 of 32 positions shown; findings below may reference images not displayed]

FINDINGS: There are mild heterogeneous opacities in the left lower lobe which may
represent atelectasis versus infection/aspiration.

Lack of intravenous contrast limits evaluation of the solid abdominal
organs.  Grossly the liver, spleen, and pancreas are unremarkable. The
gallbladder is unremarkable. There are small nodules in the bilateral
adrenal glands which are consistent with adrenal adenomas. There are several
foci of free intraperitoneal air along the diaphragm. There are a few small
foci of air in the porta hepatis.

There are multiple low-attenuation lesions in the kidneys which are
consistent with cysts. There are also low-attenuation lesions which are too
small to characterize. No hydronephrosis or renal calculi.

There is diverticulosis of the sigmoid colon. There is mild bowel wall
thickening of the mid to proximal sigmoid colon. There is mild adjacent
inflammatory stranding. There are several foci of extraluminal air adjacent
to the sigmoid colon. Findings are concerning for perforation of the sigmoid
colon. No discrete of fluid collection identified. There are multiple foci
of extraluminal air in the anterior mesenteric fat. The appendix is normal.
Multiple radiation prostate seeds are present.

No aggressive lytic or sclerotic osseous lesions are identified.
IMPRESSION: 1. Pneumoperitoneum, consistent with perforated hollow viscus. Though the
exact site of perforation not definitive, it is likely related to a
perforation of the sigmoid colon, where there are findings which likely
represent acute diverticulitis. No discrete extraluminal fluid collection
identified.
2. Mild heterogeneous opacities in the left lower lobe may represent
atelectasis, but raise the possibility of infection/aspiration.

This was called to Dr. Keaorata Mandu Ngorima at 1615 hours 01/09/2012.

## 2013-07-11 DIAGNOSIS — H353290 Exudative age-related macular degeneration, unspecified eye, stage unspecified: Secondary | ICD-10-CM | POA: Diagnosis not present

## 2013-07-13 ENCOUNTER — Ambulatory Visit (INDEPENDENT_AMBULATORY_CARE_PROVIDER_SITE_OTHER): Payer: Medicare Other

## 2013-07-13 DIAGNOSIS — Z7901 Long term (current) use of anticoagulants: Secondary | ICD-10-CM | POA: Diagnosis not present

## 2013-07-13 DIAGNOSIS — Z9889 Other specified postprocedural states: Secondary | ICD-10-CM | POA: Diagnosis not present

## 2013-07-13 DIAGNOSIS — I4891 Unspecified atrial fibrillation: Secondary | ICD-10-CM | POA: Diagnosis not present

## 2013-07-13 DIAGNOSIS — I059 Rheumatic mitral valve disease, unspecified: Secondary | ICD-10-CM | POA: Diagnosis not present

## 2013-07-13 LAB — POCT INR: INR: 3.3

## 2013-07-27 ENCOUNTER — Other Ambulatory Visit: Payer: Self-pay

## 2013-08-03 DIAGNOSIS — C61 Malignant neoplasm of prostate: Secondary | ICD-10-CM | POA: Diagnosis not present

## 2013-08-03 DIAGNOSIS — IMO0002 Reserved for concepts with insufficient information to code with codable children: Secondary | ICD-10-CM | POA: Diagnosis not present

## 2013-08-03 DIAGNOSIS — N281 Cyst of kidney, acquired: Secondary | ICD-10-CM | POA: Diagnosis not present

## 2013-08-03 DIAGNOSIS — R339 Retention of urine, unspecified: Secondary | ICD-10-CM | POA: Diagnosis not present

## 2013-08-15 DIAGNOSIS — H353290 Exudative age-related macular degeneration, unspecified eye, stage unspecified: Secondary | ICD-10-CM | POA: Diagnosis not present

## 2013-08-24 ENCOUNTER — Ambulatory Visit (INDEPENDENT_AMBULATORY_CARE_PROVIDER_SITE_OTHER): Payer: Medicare Other

## 2013-08-24 DIAGNOSIS — I059 Rheumatic mitral valve disease, unspecified: Secondary | ICD-10-CM | POA: Diagnosis not present

## 2013-08-24 DIAGNOSIS — Z7901 Long term (current) use of anticoagulants: Secondary | ICD-10-CM

## 2013-08-24 DIAGNOSIS — Z9889 Other specified postprocedural states: Secondary | ICD-10-CM

## 2013-08-24 DIAGNOSIS — I4891 Unspecified atrial fibrillation: Secondary | ICD-10-CM | POA: Diagnosis not present

## 2013-08-24 LAB — POCT INR: INR: 3.2

## 2013-09-06 ENCOUNTER — Encounter: Payer: Self-pay | Admitting: Pulmonary Disease

## 2013-09-06 ENCOUNTER — Ambulatory Visit (INDEPENDENT_AMBULATORY_CARE_PROVIDER_SITE_OTHER): Payer: Medicare Other | Admitting: Pulmonary Disease

## 2013-09-06 VITALS — BP 110/66 | HR 64 | Temp 98.4°F | Ht 67.0 in | Wt 171.0 lb

## 2013-09-06 DIAGNOSIS — Z23 Encounter for immunization: Secondary | ICD-10-CM

## 2013-09-06 DIAGNOSIS — J449 Chronic obstructive pulmonary disease, unspecified: Secondary | ICD-10-CM

## 2013-09-06 DIAGNOSIS — R599 Enlarged lymph nodes, unspecified: Secondary | ICD-10-CM | POA: Diagnosis not present

## 2013-09-06 DIAGNOSIS — Z5189 Encounter for other specified aftercare: Secondary | ICD-10-CM | POA: Diagnosis not present

## 2013-09-06 DIAGNOSIS — T17908D Unspecified foreign body in respiratory tract, part unspecified causing other injury, subsequent encounter: Secondary | ICD-10-CM

## 2013-09-06 DIAGNOSIS — R590 Localized enlarged lymph nodes: Secondary | ICD-10-CM

## 2013-09-06 NOTE — Patient Instructions (Signed)
We will order order a CT scan at [facility] to follow up the mediastinal lymphadenopathy Keep using spiriva daily Keep using your oxygen with exertion We will see you back in 6 months

## 2013-09-06 NOTE — Assessment & Plan Note (Signed)
Continue small bites, eat slowly as directed by speech therapy.

## 2013-09-06 NOTE — Assessment & Plan Note (Signed)
This was seen after resolution of his pneumonia.  For some reason he never went for the follow up CT chest after the last visit.  Plan: -CT chest without contrast to evaluate mediastinal lymph node enlargement -will need EBUS/FNA if still noted on repeat CT

## 2013-09-06 NOTE — Assessment & Plan Note (Addendum)
This has been a stable interval for [place].  Plan: -continue O2 with exertion (2L [place]), encouraged regular use today -continue daily Spiriva (applied for assistance today) -flu shot today

## 2013-09-06 NOTE — Progress Notes (Signed)
Subjective:    Patient ID: [name], male    DOB: Jul 08, 1941, 72 y.o.   MRN: [id number]  Synopsis: [name] was first seen in the [facility] Pulmonary office in 10/2012 for COPD.  He had been admitted to the hospital for bronchitis in months prior.  His spirometry showed clear obstruction and an FEV1 of 48% predicted.  He also has a history of a CABG and Mitral valve replacement.  HPI   11/09/2012 ROV -- [name] says that he has not felt any better since he saw [place] last. He went to the [place] office for an acute visit and was given prednisone but he says it didn't help.  He is coughing up dark brown sputum in the mornings, not as much throughout the day.  He says that he has been noticing "gurgling" a lot while sitting or lying still.  He often has to get up in the middle of the night because of this.  He gets relief from the albuterol neb he has at home. He has been using the spiriva and the symbicort but he's not sure if they help.  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  He has not had chest pain and he has not had leg swelling.  He has not been choking on food or noticing food going down the wrong pipe.  No nausea or vomiting.  11/23/2012 ROV -- [name] has been doing great. Since the last visit he has had complete resolution of the cough, wheezing, and shortness of breath. He took all of the Levaquin and says that it started working within hours of the first pill. He has not been using the nebulized bronchodilator or inhaled steroid because he thought he didn't need it. He is taking the Spiriva. His flu shot is up-to-date. He does not exercise on a regular basis that he stays active at home. He is currently undergoing a workup for cardiac ischemia by Dr. Mariah Milling.  03/01/2013 ROV -- [name] is doing very well.  Since our last visit he has been breathing much better and is now only using spiriva daily.  He says  that this drug really makes a difference for him.  He is having difficulty paying for it however.  He also notes some dry mouth at night and cough in the mornings.  No sinus symptoms.  He is not using his oxygen.  No weight change, chest pain.    08/22/2013 ROV > [name] says the has been doing well since the last visit. Had one epsode of increased wheezing, he self treated with increased breathing treaments and it went away.  He had a swallowing study which said that he had some food entering the valecula, but none went into his lungs. He is having shots in his eyes right now for macular degeneration.  Still taking Spiriva, doesn't really use it daily.  His weight has been stable.  Using oxygen when he feels like he needs it 2 L with exertion.    Past Medical History  Diagnosis Date  . Atrial fibrillation     chronic  . Hypertension   . Hyperkalemia   . Acute systolic heart failure   . Coronary artery disease     Cath 2005- patent LIMA-LAD, SVG-OM grafts, moderate MR  . Prostate cancer 04/2009  . Hyperlipidemia   . COPD (chronic obstructive pulmonary disease)  URI 9/12  . DM type 2 (diabetes mellitus, type 2)   . PNA (pneumonia) 03/2010  . Glaucoma   . Hard of hearing   . Myocardial infarction   . CHF (congestive heart failure)   . Gout   . Adenomatous polyps   . Diverticulosis     left side   . H/O mitral valve prolapse   . Sigmoid diverticulitis     perforated      Review of Systems  Constitutional: Negative for fever, chills and fatigue.  HENT: Negative for congestion, rhinorrhea and postnasal drip.   Respiratory: Positive for shortness of breath. Negative for cough, choking and wheezing.   Cardiovascular: Positive for palpitations. Negative for chest pain and leg swelling.  Gastrointestinal: Negative for nausea, abdominal pain, diarrhea, constipation, abdominal distention and rectal pain.       Objective:   Physical Exam   Filed Vitals:   09/06/13 0854  BP:  110/66  Pulse: 64  Temp: 98.4 F (36.9 C)  TempSrc: Oral  Height: 5\' 7"  (1.702 m)  Weight: 171 lb (77.565 kg)  SpO2: 96%    Gen:  no acute distress HEENT: NCAT, TM's clear, EOMi, OP clear PULM: CTA B CV: Irreg Irreg, mechanical S1, no JVD AB: BS+, soft, nontender, no hsm Ext: warm, no edema, no clubbing, no cyanosis  10/25/2012 CXR >> cardiomegally, no clear infiltrate 10/25/2012 Spirometry >> clear obstruction on flow volume loop, FEV1 48% predicted 11/2012 CT chest >> LLL pneumonia, enlarged mediastinal lymph nodes 02/22/2013 CT chest [facility] >> complete resolution of pneumonia; mediastinal lymphadenopathy persists, 1.7 cm 4R node, 1.0cm 7 node      Assessment & Plan:   COPD (chronic obstructive pulmonary disease) This has been a stable interval for [place].  Plan: -continue O2 with exertion (2L [place]), encouraged regular use today -continue daily Spiriva (applied for assistance today) -flu shot today  Mediastinal lymphadenopathy This was seen after resolution of his pneumonia.  For some reason he never went for the follow up CT chest after the last visit.  Plan: -CT chest without contrast to evaluate mediastinal lymph node enlargement -will need EBUS/FNA if still noted on repeat CT  Aspiration into respiratory tract Continue small bites, eat slowly as directed by speech therapy.    Updated Medication List Outpatient Encounter Prescriptions as of 09/06/2013  Medication Sig Dispense Refill  . carvedilol (COREG) 12.5 MG tablet Take 1.5 tablets (18.75 mg total) by mouth 2 (two) times daily with a meal.  270 tablet  3  . Cholecalciferol (VITAMIN D3) 1000 UNITS CAPS Take 1 capsule by mouth daily.        Marland Kitchen dextromethorphan-guaiFENesin (MUCINEX DM) 30-600 MG per 12 hr tablet Take 1 tablet by mouth every 12 (twelve) hours.      . digoxin (LANOXIN) 0.125 MG tablet Take 1 tablet (125 mcg total) by mouth daily.  90 tablet  3  . furosemide (LASIX) 20 MG tablet Take 1 tablet (20 mg  total) by mouth daily.  90 tablet  3  . losartan (COZAAR) 100 MG tablet Take 0.5 tablets (50 mg total) by mouth daily.  90 tablet  3  . metFORMIN (GLUCOPHAGE) 500 MG tablet Take 500 mg by mouth 2 (two) times daily with a meal.       . OVER THE COUNTER MEDICATION Take 1 tablet by mouth daily. Preservision Eye Vitamins      . simvastatin (ZOCOR) 40 MG tablet Take 40 mg by mouth at bedtime.       Marland Kitchen  Tamsulosin HCl (FLOMAX) 0.4 MG CAPS Take 0.4 mg by mouth daily.       Marland Kitchen tiotropium (SPIRIVA HANDIHALER) 18 MCG inhalation capsule Place 1 capsule (18 mcg total) into inhaler and inhale daily.  90 capsule  3  . warfarin (COUMADIN) 2.5 MG tablet Take as directed by  coumadin clinic  120 tablet  1   No facility-administered encounter medications on file as of 09/06/2013.

## 2013-09-12 ENCOUNTER — Ambulatory Visit: Payer: Self-pay | Admitting: Pulmonary Disease

## 2013-09-12 DIAGNOSIS — Z954 Presence of other heart-valve replacement: Secondary | ICD-10-CM | POA: Diagnosis not present

## 2013-09-12 DIAGNOSIS — R599 Enlarged lymph nodes, unspecified: Secondary | ICD-10-CM | POA: Diagnosis not present

## 2013-09-12 DIAGNOSIS — R918 Other nonspecific abnormal finding of lung field: Secondary | ICD-10-CM | POA: Diagnosis not present

## 2013-09-12 DIAGNOSIS — I517 Cardiomegaly: Secondary | ICD-10-CM | POA: Diagnosis not present

## 2013-09-13 ENCOUNTER — Telehealth: Payer: Self-pay | Admitting: *Deleted

## 2013-09-13 ENCOUNTER — Encounter: Payer: Self-pay | Admitting: Pulmonary Disease

## 2013-09-13 NOTE — Telephone Encounter (Signed)
LMTCB

## 2013-09-13 NOTE — Telephone Encounter (Signed)
Message copied by Christen Butter on Tue Sep 13, 2013 11:09 AM ------      Message from: Max Fickle B      Created: Tue Sep 13, 2013 10:08 AM       L,            Please let him know that the lymph nodes in his chest are improved.  They are not gone, but they are smaller which is good. If he has fever, chills, weight loss that he can't explain he needs to let me know to look into it further.            Thanks,      B ------

## 2013-09-16 DIAGNOSIS — H353290 Exudative age-related macular degeneration, unspecified eye, stage unspecified: Secondary | ICD-10-CM | POA: Diagnosis not present

## 2013-09-16 NOTE — Telephone Encounter (Signed)
Called, spoke with pt.  Informed him of results and recs as stated below per BQ.  He verbalized understanding of this and voiced no further questions or concerns at this time.

## 2013-09-16 NOTE — Telephone Encounter (Signed)
Returning call can be reached at [phone] or [phone].Raylene Everts

## 2013-10-05 ENCOUNTER — Ambulatory Visit (INDEPENDENT_AMBULATORY_CARE_PROVIDER_SITE_OTHER): Payer: Medicare Other | Admitting: General Practice

## 2013-10-05 DIAGNOSIS — Z7901 Long term (current) use of anticoagulants: Secondary | ICD-10-CM | POA: Diagnosis not present

## 2013-10-05 DIAGNOSIS — Z9889 Other specified postprocedural states: Secondary | ICD-10-CM

## 2013-10-05 DIAGNOSIS — I4891 Unspecified atrial fibrillation: Secondary | ICD-10-CM | POA: Diagnosis not present

## 2013-10-05 DIAGNOSIS — I059 Rheumatic mitral valve disease, unspecified: Secondary | ICD-10-CM

## 2013-10-05 LAB — POCT INR: INR: 3.9

## 2013-10-14 ENCOUNTER — Encounter: Payer: Self-pay | Admitting: Pulmonary Disease

## 2013-10-20 DIAGNOSIS — Z79899 Other long term (current) drug therapy: Secondary | ICD-10-CM | POA: Diagnosis not present

## 2013-10-20 DIAGNOSIS — E119 Type 2 diabetes mellitus without complications: Secondary | ICD-10-CM | POA: Diagnosis not present

## 2013-10-25 DIAGNOSIS — E119 Type 2 diabetes mellitus without complications: Secondary | ICD-10-CM | POA: Diagnosis not present

## 2013-10-25 DIAGNOSIS — I10 Essential (primary) hypertension: Secondary | ICD-10-CM | POA: Diagnosis not present

## 2013-10-26 ENCOUNTER — Ambulatory Visit (INDEPENDENT_AMBULATORY_CARE_PROVIDER_SITE_OTHER): Payer: Medicare Other | Admitting: General Practice

## 2013-10-26 DIAGNOSIS — I059 Rheumatic mitral valve disease, unspecified: Secondary | ICD-10-CM

## 2013-10-26 DIAGNOSIS — Z7901 Long term (current) use of anticoagulants: Secondary | ICD-10-CM | POA: Diagnosis not present

## 2013-10-26 DIAGNOSIS — I4891 Unspecified atrial fibrillation: Secondary | ICD-10-CM | POA: Diagnosis not present

## 2013-10-26 DIAGNOSIS — Z9889 Other specified postprocedural states: Secondary | ICD-10-CM | POA: Diagnosis not present

## 2013-10-26 LAB — POCT INR: INR: 3

## 2013-10-28 ENCOUNTER — Encounter: Payer: Self-pay | Admitting: Cardiovascular Disease

## 2013-10-28 ENCOUNTER — Ambulatory Visit (INDEPENDENT_AMBULATORY_CARE_PROVIDER_SITE_OTHER): Payer: Medicare Other | Admitting: Cardiovascular Disease

## 2013-10-28 VITALS — BP 108/60 | HR 48 | Ht 67.0 in | Wt 169.5 lb

## 2013-10-28 DIAGNOSIS — E119 Type 2 diabetes mellitus without complications: Secondary | ICD-10-CM

## 2013-10-28 DIAGNOSIS — I10 Essential (primary) hypertension: Secondary | ICD-10-CM

## 2013-10-28 DIAGNOSIS — I059 Rheumatic mitral valve disease, unspecified: Secondary | ICD-10-CM | POA: Diagnosis not present

## 2013-10-28 DIAGNOSIS — R001 Bradycardia, unspecified: Secondary | ICD-10-CM | POA: Insufficient documentation

## 2013-10-28 DIAGNOSIS — E785 Hyperlipidemia, unspecified: Secondary | ICD-10-CM | POA: Diagnosis not present

## 2013-10-28 DIAGNOSIS — I498 Other specified cardiac arrhythmias: Secondary | ICD-10-CM

## 2013-10-28 DIAGNOSIS — I4891 Unspecified atrial fibrillation: Secondary | ICD-10-CM

## 2013-10-28 MED ORDER — CARVEDILOL 12.5 MG PO TABS: 12.5000 mg | ORAL_TABLET | Freq: Two times a day (BID) | ORAL | Status: DC

## 2013-10-28 NOTE — Assessment & Plan Note (Signed)
We'll repeat echocardiogram next year for prosthetic mitral valve

## 2013-10-28 NOTE — Assessment & Plan Note (Signed)
Diabetes is well controlled.

## 2013-10-28 NOTE — Assessment & Plan Note (Signed)
Blood pressure is well controlled on today's visit. No changes made to the medications. 

## 2013-10-28 NOTE — Progress Notes (Signed)
Patient ID: [name], male    DOB: 1941-04-10, 72 y.o.   MRN: [id number]  HPI Comments: Mr. Ky is a very pleasant 72 year old gentleman with a past medical history of coronary artery disease, bypass surgery with a left internal mammary to the LAD and saphenous vein graft to the OM in 1989, PCI of the RCA in 2002, COPD, mitral valve replacement with prosthetic valve on Coumadin in 2007, history of chronic atrial fibrillation, diabetes, hypertension, hyperlipidemia, PNA in 03/2010, who presents for routine followup.   hospital in September 04 2011 with atrial fibrillation with RVR, chest pain, bronchitis, COPD exacerbation. He was treated with steroids, antibiotics, rate control for his atrial fibrillation with improvement of his symptoms.    hospital January 06 2012 with shortness of breath and congestion felt to have COPD exacerbation,  nebulizers, steroids. He suffered a perforated bowel with air under his diaphragm and was transferred to [facility] and within 48 hours, had surgery performed. He presents today with an ostomy. 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 He had a 24 pound weight loss through this procedure, open abdominal wall wound healing by secondary intention.  He was discharged with underlying anemia, hematocrit 27 INR of 4  Repeat echocardiogram for shortness of breath  starting in October showed a decline in his ejection fraction to 20-30%, worsening inferior, posterior and lateral wall hypokinesis (inferior and posterior wall are almost akinetic). Prior ejection fraction 40-45%.   stress test late 2013 showing large region of scar in the inferior and inferolateral wall consistent with prior MI, no ischemia  Today he reports feeling well with no symptoms of shortness of breath.Overall feels very happy. He is taking diuretic daily. He denies any significant leg edema. He does have  dizziness if he bends over for long periods of time and then stands up. Denies having any chest pain.He continues to have his ostomy. he has decided not to have this repaired after discussion with the surgeon   EKG shows atrial fibrillation with rate 48 beats a minute, nonspecific ST abnormality, right bundle branch block, old inferior MI  recent lab work April 2014 with total cholesterol 98, LDL 33, creatinine 1.1, hemoglobin A1c 6.4      Outpatient Encounter Prescriptions as of 10/28/2013  Medication Sig  . carvedilol (COREG) 12.5 MG tablet Take 1.5 tablets (18.75 mg total) by mouth 2 (two) times daily with a meal.  . Cholecalciferol (VITAMIN D3) 1000 UNITS CAPS Take 1 capsule by mouth daily.    Marland Kitchen dextromethorphan-guaiFENesin (MUCINEX DM) 30-600 MG per 12 hr tablet Take 1 tablet by mouth every 12 (twelve) hours.  . digoxin (LANOXIN) 0.125 MG tablet Take 1 tablet (125 mcg total) by mouth daily.  . furosemide (LASIX) 20 MG tablet Take 1 tablet (20 mg total) by mouth daily.  Marland Kitchen losartan (COZAAR) 100 MG tablet Take 0.5 tablets (50 mg total) by mouth daily.  . metFORMIN (GLUCOPHAGE) 500 MG tablet Take 500 mg by mouth 2 (two) times daily with a meal.   . OVER THE COUNTER MEDICATION Take 1 tablet by mouth daily. Preservision Eye Vitamins  . simvastatin (ZOCOR) 40 MG tablet Take 40 mg by mouth at bedtime.   . Tamsulosin HCl (FLOMAX) 0.4 MG CAPS Take 0.4 mg by mouth daily.   Marland Kitchen tiotropium (SPIRIVA HANDIHALER) 18 MCG inhalation capsule Place 1 capsule (18 mcg total) into inhaler and inhale daily.  Marland Kitchen warfarin (COUMADIN)  2.5 MG tablet Take as directed by  coumadin clinic    Review of Systems  Constitutional: Negative.   HENT: Negative.   Eyes: Negative.   Cardiovascular: Negative.   Gastrointestinal: Negative.   Musculoskeletal: Negative.   Skin: Negative.   Neurological: Positive for dizziness.  Psychiatric/Behavioral: Negative.   All other systems reviewed and are negative.   BP 108/60   Pulse 48  Ht 5\' 7"  (1.702 m)  Wt 169 lb 8 oz (76.885 kg)  BMI 26.54 kg/m2  Physical Exam  Nursing note and vitals reviewed. Constitutional: He is oriented to person, place, and time. He appears well-developed and well-nourished.  HENT:  Head: Normocephalic.  Nose: Nose normal.  Mouth/Throat: Oropharynx is clear and moist.  Eyes: Conjunctivae are normal. Pupils are equal, round, and reactive to light.  Neck: Normal range of motion. Neck supple. No JVD present.  Cardiovascular: Normal rate, S1 normal, S2 normal and intact distal pulses.  An irregularly irregular rhythm present. Exam reveals no gallop and no friction rub.   Murmur heard.  Crescendo systolic murmur is present with a grade of 2/6  Pulmonary/Chest: Effort normal. No respiratory distress. He has decreased breath sounds. He has no wheezes. He has no rales. He exhibits no tenderness.  Abdominal: Soft. Bowel sounds are normal. He exhibits no distension. There is no tenderness.  Ostomy in place  Musculoskeletal: Normal range of motion. He exhibits no edema and no tenderness.  Lymphadenopathy:    He has no cervical adenopathy.  Neurological: He is alert and oriented to person, place, and time. Coordination normal.  Skin: Skin is warm and dry. No rash noted. No erythema.  Psychiatric: He has a normal mood and affect. His behavior is normal. Judgment and thought content normal.      Assessment and Plan

## 2013-10-28 NOTE — Assessment & Plan Note (Signed)
Cholesterol is at goal on the current lipid regimen. No changes to the medications were made.  

## 2013-10-28 NOTE — Assessment & Plan Note (Signed)
Heart rate is slowed today. We will decrease the Coreg down to 12.5 mg twice a day. We have recommended he closely monitor his heart rate and call he office if heart rate continues to be in the low 50s

## 2013-10-28 NOTE — Patient Instructions (Addendum)
You are doing well. Please decrease the coreg down to 12.5 mg twice a day (down from 1 1/2 pills a day)  Monitor the heart rate at home  Please call us if you have new issues that need to be addressed before your next appt.  Your physician wants you to follow-up in: 6 months.  You will receive a reminder letter in the mail two months in advance. If you don't receive a letter, please call our office to schedule the follow-up appointment.

## 2013-10-31 DIAGNOSIS — H4010X0 Unspecified open-angle glaucoma, stage unspecified: Secondary | ICD-10-CM | POA: Diagnosis not present

## 2013-11-07 DIAGNOSIS — H353290 Exudative age-related macular degeneration, unspecified eye, stage unspecified: Secondary | ICD-10-CM | POA: Diagnosis not present

## 2013-11-23 ENCOUNTER — Ambulatory Visit (INDEPENDENT_AMBULATORY_CARE_PROVIDER_SITE_OTHER): Payer: Medicare Other | Admitting: General Practice

## 2013-11-23 DIAGNOSIS — Z7901 Long term (current) use of anticoagulants: Secondary | ICD-10-CM | POA: Diagnosis not present

## 2013-11-23 DIAGNOSIS — Z9889 Other specified postprocedural states: Secondary | ICD-10-CM | POA: Diagnosis not present

## 2013-11-23 DIAGNOSIS — I4891 Unspecified atrial fibrillation: Secondary | ICD-10-CM | POA: Diagnosis not present

## 2013-11-23 DIAGNOSIS — I059 Rheumatic mitral valve disease, unspecified: Secondary | ICD-10-CM

## 2013-12-12 DIAGNOSIS — H353290 Exudative age-related macular degeneration, unspecified eye, stage unspecified: Secondary | ICD-10-CM | POA: Diagnosis not present

## 2013-12-19 ENCOUNTER — Ambulatory Visit (INDEPENDENT_AMBULATORY_CARE_PROVIDER_SITE_OTHER): Payer: Medicare Other | Admitting: Internal Medicine

## 2013-12-19 ENCOUNTER — Encounter: Payer: Self-pay | Admitting: Internal Medicine

## 2013-12-19 ENCOUNTER — Ambulatory Visit (INDEPENDENT_AMBULATORY_CARE_PROVIDER_SITE_OTHER)
Admission: RE | Admit: 2013-12-19 | Discharge: 2013-12-19 | Disposition: A | Discharge status: Home / Self Care | Payer: Medicare Other | Source: Ambulatory Visit | Attending: Internal Medicine | Admitting: Internal Medicine

## 2013-12-19 ENCOUNTER — Telehealth: Payer: Self-pay | Admitting: Pulmonary Disease

## 2013-12-19 VITALS — BP 120/70 | HR 98 | Ht 67.0 in | Wt 171.4 lb

## 2013-12-19 DIAGNOSIS — R042 Hemoptysis: Secondary | ICD-10-CM | POA: Diagnosis not present

## 2013-12-19 DIAGNOSIS — J189 Pneumonia, unspecified organism: Secondary | ICD-10-CM

## 2013-12-19 DIAGNOSIS — T17908A Unspecified foreign body in respiratory tract, part unspecified causing other injury, initial encounter: Secondary | ICD-10-CM

## 2013-12-19 DIAGNOSIS — J984 Other disorders of lung: Secondary | ICD-10-CM | POA: Diagnosis not present

## 2013-12-19 MED ORDER — LEVOFLOXACIN 500 MG PO TABS: 500.0000 mg | ORAL_TABLET | Freq: Every day | ORAL | Status: DC

## 2013-12-19 NOTE — Assessment & Plan Note (Addendum)
New bilateral infiltrates. Suspect aspiration is part of this, although he had increased chest congestion and cough 2-3 weeks before acute exacerbation overnight. He is not toxic enough to think all of parenchymal infiltrate on today's CXR is pneumonia. Doubt flu w/ ARDS. Plan- levaquin. Use Home O2 and meds as prescribed, not prn/ occasionally.           Cautioned levaquin/ warfarin, since already INR 2.9. Next warfarin INR check is early January. Streak hemoptysis reflects hard cough            on warfarin. Cautioned to let us know if it recurs. Go to ER if it gets significantly worse.

## 2013-12-19 NOTE — Telephone Encounter (Signed)
Pt c/o increased cough with bloody "dark red" mucus production, fever and SOB. Pt states that Dr Kendrick Fries has given him Levaquin 500mg  in the past. Pt is concerned that he will end up in the hospital if not seen or treated soon.  Pt wanting to be seen today--aware that we only have 2 Drs in office today. Allergies  Allergen Reactions  . Acetaminophen-Codeine     REACTION: hives  . Altace [Ramipril]   . Codeine     REACTION: hives  . Sulfonamide Derivatives     REACTION: hives    I have spoken with Florentina Addison and pt is to be seen by CY today at 11:15 with cxr prior here at [facility] office. cxr order to be placed. Pt aware to check in with front desk and then go down for cxr.

## 2013-12-19 NOTE — Progress Notes (Signed)
Subjective:    Patient ID: [name], male    DOB: 05-14-1941, 72 y.o.   MRN: [id number]  Synopsis: [place] Gipe was first seen in the [facility] office in 10/2012 for COPD.  He had been admitted to the hospital for bronchitis in months prior.  His spirometry showed clear obstruction and an FEV1 of 48% predicted.  He also has a history of a CABG and Mitral valve replacement.  HPI  11/09/2012 ROV -- Kaicen says that he has not felt any better since he saw [place] last. He went to the [place] office for an acute visit and was given prednisone but he says it didn't help.  He is coughing up dark brown sputum in the mornings, not as much throughout the day.  He says that he has been noticing "gurgling" a lot while sitting or lying still.  He often has to get up in the middle of the night because of this.  He gets relief from the albuterol neb he has at home. He has been using the spiriva and the symbicort but he's not sure if they help.  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  He has not had chest pain and he has not had leg swelling.  He has not been choking on food or noticing food going down the wrong pipe.  No nausea or vomiting.  11/23/2012 ROV -- [place] has been doing great. Since the last visit he has had complete resolution of the cough, wheezing, and shortness of breath. He took all of the Levaquin and says that it started working within hours of the first pill. He has not been using the nebulized bronchodilator or inhaled steroid because he thought he didn't need it. He is taking the Spiriva. His flu shot is up-to-date. He does not exercise on a regular basis that he stays active at home. He is currently undergoing a workup for cardiac ischemia by Dr. Mariah Milling.  03/01/2013 ROV -- Mr. Troiano is doing very well.  Since our last visit he has been breathing much better and is now only using spiriva daily.  He says that  this drug really makes a difference for him.  He is having difficulty paying for it however.  He also notes some dry mouth at night and cough in the mornings.  No sinus symptoms.  He is not using his oxygen.  No weight change, chest pain.    08/22/2013 ROV > Mr. Mcquitty says the has been doing well since the last visit. Had one epsode of increased wheezing, he self treated with increased breathing treaments and it went away.  He had a swallowing study which said that he had some food entering the valecula, but none went into his lungs. He is having shots in his eyes right now for macular degeneration.  Still taking Spiriva, doesn't really use it daily.  His weight has been stable.  Using oxygen when he feels like he needs it 2 L with exertion.    12/19/13- Acute visit, Dr Maple Hudson ACUTE VISIT: BQ patient; cough-productive-blood (dark) started this morning around 3am; wheezing. Pt states he has O2 but doesn't wear it much. CXR done prior to OV today. Hx chronic AFib/ warfarin, CAD, CHF, COPD, DM     INR 2.9 on 11/22/13 Describes increased congestion 2-3 weeks, better at night. Last night went to bed wheezing, increased cough. Had chills,  maybe fever. Woke 3AM hard cough, white with few blood streaks. None since he got up. Denies obvious reflux. Says Last year flet like this- levaquin worked well. No leg or chest pain, palpitation or nodes.  CXR 12/19/13 (large heart) IMPRESSION:  Increased densities in the lower chest bilaterally may represent  dependent edema and/or atelectasis. Cannot exclude an atypical  infectious process.  Electronically Signed  By: Richarda Overlie M.D.  On: 12/19/2013 11:07  Past Medical History  Diagnosis Date  . Atrial fibrillation     chronic  . Hypertension   . Hyperkalemia   . Acute systolic heart failure   . Coronary artery disease     Cath 2005- patent LIMA-LAD, SVG-OM grafts, moderate MR  . Prostate cancer 04/2009  . Hyperlipidemia   . COPD (chronic obstructive pulmonary  disease)     URI 9/12  . DM type 2 (diabetes mellitus, type 2)   . PNA (pneumonia) 03/2010  . Glaucoma   . Hard of hearing   . Myocardial infarction   . CHF (congestive heart failure)   . Gout   . Adenomatous polyps   . Diverticulosis     left side   . H/O mitral valve prolapse   . Sigmoid diverticulitis     perforated      Review of Systems  Constitutional: Negative for fever, chills and fatigue.  HENT: Negative for congestion, postnasal drip and rhinorrhea.   Respiratory: Positive for shortness of breath. Negative for cough, choking and wheezing.   Cardiovascular: Positive for palpitations. Negative for chest pain and leg swelling.  Gastrointestinal: Negative for nausea, abdominal pain, diarrhea, constipation, abdominal distention and rectal pain.       Objective:   Physical Exam  Filed Vitals:   12/19/13 1118  BP: 120/70  Pulse: 98  Height: 5\' 7"  (1.702 m)  Weight: 171 lb 6.4 oz (77.747 kg)  SpO2: 90%    OBJ- Physical Exam General- Alert, Oriented, Affect-appropriate, Distress- none acute, conversational on room air Skin- rash-none, lesions- none, excoriation- none Lymphadenopathy- none Head- atraumatic            Eyes- Gross vision intact, PERRLA, conjunctivae and secretions clear            Ears- Seems hard of hearing            Nose- Clear, no-Septal dev, mucus, polyps, erosion, perforation             Throat-  mucosa clear , drainage- none, tonsils- atrophic Neck- flexible , trachea midline, no stridor , thyroid nl, carotid no bruit Chest - symmetrical excursion , unlabored           Heart/CV- +IRR , no murmur , no gallop  , no rub, nl s1 s2, crisp valve sound                           - JVD- none , edema- none, stasis changes- none, varices- none           Lung- quiet, unlabored, wheeze- none, cough- none , dullness-none, rub- none           Chest wall-  Abd-  Br/ Gen/ Rectal- Not done, not indicated Extrem- cyanosis- none, clubbing, none, atrophy-  none, strength- nl Neuro- grossly intact to observation    10/25/2012 CXR >> cardiomegally, no clear infiltrate 10/25/2012 Spirometry >> clear obstruction on flow volume loop, FEV1 48% predicted 11/2012 CT chest >> LLL pneumonia, enlarged  mediastinal lymph nodes 02/22/2013 CT chest [facility] >> complete resolution of pneumonia; mediastinal lymphadenopathy persists, 1.7 cm 4R node, 1.0cm 7 node      Assessment & Plan:   No problem-specific assessment & plan notes found for this encounter.   Updated Medication List Outpatient Encounter Prescriptions as of 12/19/2013  Medication Sig  . carvedilol (COREG) 12.5 MG tablet Take 1 tablet (12.5 mg total) by mouth 2 (two) times daily with a meal.  . Cholecalciferol (VITAMIN D3) 1000 UNITS CAPS Take 1 capsule by mouth daily.    Marland Kitchen dextromethorphan-guaiFENesin (MUCINEX DM) 30-600 MG per 12 hr tablet Take 1 tablet by mouth every 12 (twelve) hours.  . digoxin (LANOXIN) 0.125 MG tablet Take 1 tablet (125 mcg total) by mouth daily.  . furosemide (LASIX) 20 MG tablet Take 1 tablet (20 mg total) by mouth daily.  Marland Kitchen losartan (COZAAR) 100 MG tablet Take 0.5 tablets (50 mg total) by mouth daily.  . metFORMIN (GLUCOPHAGE) 500 MG tablet Take 500 mg by mouth 2 (two) times daily with a meal.   . OVER THE COUNTER MEDICATION Take 1 tablet by mouth daily. Preservision Eye Vitamins  . simvastatin (ZOCOR) 40 MG tablet Take 40 mg by mouth at bedtime.   . Tamsulosin HCl (FLOMAX) 0.4 MG CAPS Take 0.4 mg by mouth daily.   Marland Kitchen tiotropium (SPIRIVA HANDIHALER) 18 MCG inhalation capsule Place 1 capsule (18 mcg total) into inhaler and inhale daily.  Marland Kitchen warfarin (COUMADIN) 2.5 MG tablet Take as directed by  coumadin clinic

## 2013-12-19 NOTE — Assessment & Plan Note (Signed)
Suspect aspiration pneumonia complicated by edema, although no R side overload today and patient didn't recognize a reflux event.  Plan- reflux precautions. He has confidence in levaquin so used that.

## 2013-12-19 NOTE — Patient Instructions (Addendum)
  Script sent for levofloxacin antibiotic. This will make you bleed easier on warfarin. If bleeding gets bad, go to emergency room.  Tell them when you get your blood checked, that you have been on antibiotic.  Staff will get you an early follow up about 2-3 weeks weeks  Please call as needed

## 2013-12-28 ENCOUNTER — Other Ambulatory Visit: Payer: Self-pay | Admitting: Cardiovascular Disease

## 2013-12-28 ENCOUNTER — Ambulatory Visit (INDEPENDENT_AMBULATORY_CARE_PROVIDER_SITE_OTHER): Payer: Medicare Other

## 2013-12-28 DIAGNOSIS — I4891 Unspecified atrial fibrillation: Secondary | ICD-10-CM

## 2013-12-28 DIAGNOSIS — I059 Rheumatic mitral valve disease, unspecified: Secondary | ICD-10-CM | POA: Diagnosis not present

## 2013-12-28 DIAGNOSIS — Z9889 Other specified postprocedural states: Secondary | ICD-10-CM

## 2013-12-28 DIAGNOSIS — Z7901 Long term (current) use of anticoagulants: Secondary | ICD-10-CM | POA: Diagnosis not present

## 2013-12-28 LAB — POCT INR: INR: 2.5

## 2014-01-02 ENCOUNTER — Ambulatory Visit: Payer: Medicare Other | Admitting: Adult Health

## 2014-01-04 ENCOUNTER — Ambulatory Visit: Payer: Medicare Other | Admitting: Pulmonary Disease

## 2014-01-07 ENCOUNTER — Other Ambulatory Visit: Payer: Self-pay | Admitting: Cardiology

## 2014-01-09 DIAGNOSIS — H43819 Vitreous degeneration, unspecified eye: Secondary | ICD-10-CM | POA: Diagnosis not present

## 2014-01-09 DIAGNOSIS — H353290 Exudative age-related macular degeneration, unspecified eye, stage unspecified: Secondary | ICD-10-CM | POA: Diagnosis not present

## 2014-01-15 ENCOUNTER — Telehealth: Payer: Self-pay | Admitting: Physician Assistant

## 2014-01-15 DIAGNOSIS — I4891 Unspecified atrial fibrillation: Secondary | ICD-10-CM

## 2014-01-15 MED ORDER — WARFARIN SODIUM 2.5 MG PO TABS: ORAL_TABLET | ORAL | Status: DC

## 2014-01-15 NOTE — Telephone Encounter (Signed)
Patient called in refill for coumadin too late and needs a week supply until mail order comes in. Sent to pharmacy. Richardson Dopp, PA-C   01/15/2014 9:22 AM

## 2014-01-16 ENCOUNTER — Ambulatory Visit (INDEPENDENT_AMBULATORY_CARE_PROVIDER_SITE_OTHER): Payer: Medicare Other | Admitting: Pulmonary Disease

## 2014-01-16 ENCOUNTER — Encounter: Payer: Self-pay | Admitting: Pulmonary Disease

## 2014-01-16 VITALS — BP 134/86 | HR 79 | Temp 97.8°F | Ht 67.0 in | Wt 173.0 lb

## 2014-01-16 DIAGNOSIS — J189 Pneumonia, unspecified organism: Secondary | ICD-10-CM | POA: Diagnosis not present

## 2014-01-16 DIAGNOSIS — J449 Chronic obstructive pulmonary disease, unspecified: Secondary | ICD-10-CM

## 2014-01-16 NOTE — Progress Notes (Signed)
Subjective:    Patient ID: [name], male    DOB: 14-Dec-1941, 73 y.o.   MRN: [id number]  Synopsis: Rashawn Rayman was first seen in the [facility] office in 10/2012 for COPD.  He had been admitted to the hospital for bronchitis in months prior.  His spirometry showed clear obstruction and an FEV1 of 48% predicted.  He also has a history of a CABG and Mitral valve replacement.  HPI   11/09/2012 ROV -- Errik says that he has not felt any better since he saw [place] last. He went to the [place] office for an acute visit and was given prednisone but he says it didn't help.  He is coughing up dark brown sputum in the mornings, not as much throughout the day.  He says that he has been noticing "gurgling" a lot while sitting or lying still.  He often has to get up in the middle of the night because of this.  He gets relief from the albuterol neb he has at home. He has been using the spiriva and the symbicort but he's not sure if they help.  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  He has not had chest pain and he has not had leg swelling.  He has not been choking on food or noticing food going down the wrong pipe.  No nausea or vomiting.  11/23/2012 ROV -- Renard has been doing great. Since the last visit he has had complete resolution of the cough, wheezing, and shortness of breath. He took all of the Levaquin and says that it started working within hours of the first pill. He has not been using the nebulized bronchodilator or inhaled steroid because he thought he didn't need it. He is taking the Spiriva. His flu shot is up-to-date. He does not exercise on a regular basis that he stays active at home. He is currently undergoing a workup for cardiac ischemia by Dr. Rockey Situ.  03/01/2013 ROV -- Mr. Mennenga is doing very well.  Since our last visit he has been breathing much better and is now only using spiriva daily.  He says  that this drug really makes a difference for him.  He is having difficulty paying for it however.  He also notes some dry mouth at night and cough in the mornings.  No sinus symptoms.  He is not using his oxygen.  No weight change, chest pain.    08/22/2013 ROV > Mr. Dershem says the has been doing well since the last visit. Had one epsode of increased wheezing, he self treated with increased breathing treaments and it went away.  He had a swallowing study which said that he had some food entering the valecula, but none went into his lungs. He is having shots in his eyes right now for macular degeneration.  Still taking Spiriva, doesn't really use it daily.  His weight has been stable.  Using oxygen when he feels like he needs it 2 L with exertion.    12/19/2013 Acute CY> Pneumonia, aspiration? Rx Levaquin, aspiration precautions  01/16/2014 ROV >> Mr. Dowdy ended up seeing Dr. Annamaria Boots in [place] for shortness of breath, cough, streaky hemoptysis which have since improved after taking the Levaquin.  However, he says the has had some intermittent episodes of chest tightness, wheezing and coughing.  On those days he has been using his nebulizers more and apparently  some antibiotic leftover from his wife (Levaquin).  He use had to take one about two weeks ago and it knocked out the cough.  He is much better but he continues to wheeze some.  He has been using albuterol on a prn basis.  He just uses the oxygen on a prn basis but he has been using it more in the last few months.   Past Medical History  Diagnosis Date  . Atrial fibrillation     chronic  . Hypertension   . Hyperkalemia   . Acute systolic heart failure   . Coronary artery disease     Cath 2005- patent LIMA-LAD, SVG-OM grafts, moderate MR  . Prostate cancer 04/2009  . Hyperlipidemia   . COPD (chronic obstructive pulmonary disease)     URI 9/12  . DM type 2 (diabetes mellitus, type 2)   . PNA (pneumonia) 03/2010  . Glaucoma   . Hard of hearing    . Myocardial infarction   . CHF (congestive heart failure)   . Gout   . Adenomatous polyps   . Diverticulosis     left side   . H/O mitral valve prolapse   . Sigmoid diverticulitis     perforated      Review of Systems  Constitutional: Negative for fever, chills and fatigue.  HENT: Negative for congestion, postnasal drip and rhinorrhea.   Respiratory: Positive for shortness of breath. Negative for cough, choking and wheezing.   Cardiovascular: Positive for palpitations. Negative for chest pain and leg swelling.  Gastrointestinal: Negative for nausea, abdominal pain, diarrhea, constipation, abdominal distention and rectal pain.       Objective:   Physical Exam   Filed Vitals:   01/16/14 1021  BP: 134/86  Pulse: 79  Temp: 97.8 F (36.6 C)  TempSrc: Oral  Height: 5\' 7"  (1.702 m)  Weight: 173 lb (78.472 kg)  SpO2: 98%  RA  Gen:  no acute distress HEENT: NCAT, TM's clear, EOMi, OP clear PULM: Wheezing very faint crackles in the left lower lobe, otherwise clear to auscultation CV: Irreg Irreg, mechanical S1, no JVD AB: BS+, soft, nontender, no hsm Ext: warm, no edema, no clubbing, no cyanosis  10/25/2012 CXR >> cardiomegally, no clear infiltrate 10/25/2012 Spirometry >> clear obstruction on flow volume loop, FEV1 48% predicted 11/2012 CT chest >> LLL pneumonia, enlarged mediastinal lymph nodes 02/22/2013 CT chest [facility] >> complete resolution of pneumonia; mediastinal lymphadenopathy persists, 1.7 cm 4R node, 1.0cm 7 node 12/19/2013 > bilateral fluffy infiltrates consistent with pneumonia      Assessment & Plan:   Pneumonia He appears to be improving significantly from the pneumonia he had earlier in the month. I suspect that that was a viral etiology.  Plan: -We will check a chest x-ray to ensure that the infiltrates have improved - If he has recurrent disease then I would consider treating with Levaquin again as he needed 14 days the last time he had  pneumonia.  COPD (chronic obstructive pulmonary disease) He has had a bit more wheezing in the last month which I suspect is due to the pneumonia. Other than that he seems to be doing okay. Unfortunately he continues to not use his oxygen as prescribed.  Plan: -Reminded to use oxygen with exertion and not on an as-needed basis -Continue Spiriva daily -Add as needed albuterol    Updated Medication List Outpatient Encounter Prescriptions as of 01/16/2014  Medication Sig  . carvedilol (COREG) 12.5 MG tablet Take 1 tablet (12.5 mg  total) by mouth 2 (two) times daily with a meal.  . Cholecalciferol (VITAMIN D3) 1000 UNITS CAPS Take 1 capsule by mouth daily.    Marland Kitchen dextromethorphan-guaiFENesin (MUCINEX DM) 30-600 MG per 12 hr tablet Take 1 tablet by mouth every 12 (twelve) hours.  Marland Kitchen DIGOX 125 MCG tablet Take 1 tablet by mouth  daily.  . furosemide (LASIX) 20 MG tablet Take 1 tablet (20 mg total) by mouth daily.  Marland Kitchen losartan (COZAAR) 100 MG tablet Take 0.5 tablets (50 mg total) by mouth daily.  . metFORMIN (GLUCOPHAGE) 500 MG tablet Take 500 mg by mouth 2 (two) times daily with a meal.   . OVER THE COUNTER MEDICATION Take 1 tablet by mouth daily. Preservision Eye Vitamins  . simvastatin (ZOCOR) 40 MG tablet Take 40 mg by mouth at bedtime.   . Tamsulosin HCl (FLOMAX) 0.4 MG CAPS Take 0.4 mg by mouth daily.   Marland Kitchen tiotropium (SPIRIVA HANDIHALER) 18 MCG inhalation capsule Place 1 capsule (18 mcg total) into inhaler and inhale daily.  Marland Kitchen warfarin (COUMADIN) 2.5 MG tablet Take as directed  . [DISCONTINUED] levofloxacin (LEVAQUIN) 500 MG tablet Take 1 tablet (500 mg total) by mouth daily.

## 2014-01-16 NOTE — Patient Instructions (Signed)
Take the Albuterol twice a day for the next week, then use as needed Keep using the Spiriva as you are doing Go get a chest X-ray at [facility] this week  We will see you back in 2-3 months or sooner if needed

## 2014-01-16 NOTE — Assessment & Plan Note (Signed)
He has had a bit more wheezing in the last month which I suspect is due to the pneumonia. Other than that he seems to be doing okay. Unfortunately he continues to not use his oxygen as prescribed.  Plan: -Reminded to use oxygen with exertion and not on an as-needed basis -Continue Spiriva daily -Add as needed albuterol

## 2014-01-16 NOTE — Assessment & Plan Note (Signed)
He appears to be improving significantly from the pneumonia he had earlier in the month. I suspect that that was a viral etiology.  Plan: -We will check a chest x-ray to ensure that the infiltrates have improved - If he has recurrent disease then I would consider treating with Levaquin again as he needed 14 days the last time he had pneumonia.

## 2014-01-17 ENCOUNTER — Ambulatory Visit: Payer: Self-pay | Admitting: Pulmonary Disease

## 2014-01-17 DIAGNOSIS — I517 Cardiomegaly: Secondary | ICD-10-CM | POA: Diagnosis not present

## 2014-01-17 DIAGNOSIS — Z951 Presence of aortocoronary bypass graft: Secondary | ICD-10-CM | POA: Diagnosis not present

## 2014-01-17 DIAGNOSIS — R091 Pleurisy: Secondary | ICD-10-CM | POA: Diagnosis not present

## 2014-01-17 DIAGNOSIS — IMO0002 Reserved for concepts with insufficient information to code with codable children: Secondary | ICD-10-CM | POA: Diagnosis not present

## 2014-01-17 DIAGNOSIS — M19019 Primary osteoarthritis, unspecified shoulder: Secondary | ICD-10-CM | POA: Diagnosis not present

## 2014-01-20 ENCOUNTER — Telehealth: Payer: Self-pay

## 2014-01-20 NOTE — Telephone Encounter (Signed)
Pt aware of results, nothing further needed.

## 2014-01-20 NOTE — Telephone Encounter (Signed)
Message copied by Len Blalock on Fri Jan 20, 2014 12:56 PM ------      Message from: Simonne Maffucci B      Created: Fri Jan 20, 2014  9:14 AM       A,            Please let him know that his CXR looked good, pneumonia has cleared up            Thanks      B ------

## 2014-01-24 ENCOUNTER — Telehealth: Payer: Self-pay | Admitting: Pulmonary Disease

## 2014-01-24 MED ORDER — PREDNISONE 10 MG PO TABS: ORAL_TABLET | ORAL | Status: DC

## 2014-01-24 MED ORDER — LEVOFLOXACIN 750 MG PO TABS: 750.0000 mg | ORAL_TABLET | Freq: Every day | ORAL | Status: DC

## 2014-01-24 NOTE — Telephone Encounter (Signed)
Spoke with pt. Saw BQ last week on 01/16/14. Reports coughing with production of white mucus. Chest congestion and wheezing are present. Onset was 2-3 weeks ago. Would like something called in.  BQ - please advise. Thanks.

## 2014-01-24 NOTE — Telephone Encounter (Signed)
Pt is aware of BQ's recs. Rx has been sent in. Nothing further is needed.

## 2014-01-24 NOTE — Telephone Encounter (Signed)
levaquin 750mg  po daily 5 days Prednisone Take 40mg  po daily for 3 days, then take 30mg  po daily for 3 days, then take 20mg  po daily for two days, then take 10mg  po daily for 2 days

## 2014-02-10 ENCOUNTER — Ambulatory Visit (INDEPENDENT_AMBULATORY_CARE_PROVIDER_SITE_OTHER): Payer: Medicare Other

## 2014-02-10 DIAGNOSIS — I059 Rheumatic mitral valve disease, unspecified: Secondary | ICD-10-CM

## 2014-02-10 DIAGNOSIS — Z7901 Long term (current) use of anticoagulants: Secondary | ICD-10-CM

## 2014-02-10 DIAGNOSIS — Z5181 Encounter for therapeutic drug level monitoring: Secondary | ICD-10-CM

## 2014-02-10 DIAGNOSIS — Z9889 Other specified postprocedural states: Secondary | ICD-10-CM | POA: Diagnosis not present

## 2014-02-10 DIAGNOSIS — I4891 Unspecified atrial fibrillation: Secondary | ICD-10-CM

## 2014-02-10 LAB — POCT INR: INR: 3.2

## 2014-02-20 DIAGNOSIS — H353290 Exudative age-related macular degeneration, unspecified eye, stage unspecified: Secondary | ICD-10-CM | POA: Diagnosis not present

## 2014-03-03 DIAGNOSIS — M25569 Pain in unspecified knee: Secondary | ICD-10-CM | POA: Diagnosis not present

## 2014-03-03 DIAGNOSIS — M79609 Pain in unspecified limb: Secondary | ICD-10-CM | POA: Diagnosis not present

## 2014-03-06 ENCOUNTER — Ambulatory Visit: Payer: Self-pay | Admitting: Unknown Physician Specialty

## 2014-03-06 DIAGNOSIS — M79609 Pain in unspecified limb: Secondary | ICD-10-CM | POA: Diagnosis not present

## 2014-03-08 ENCOUNTER — Other Ambulatory Visit: Payer: Self-pay | Admitting: Cardiovascular Disease

## 2014-03-13 ENCOUNTER — Emergency Department: Payer: Self-pay | Admitting: Emergency Medicine

## 2014-03-13 DIAGNOSIS — I951 Orthostatic hypotension: Secondary | ICD-10-CM | POA: Diagnosis not present

## 2014-03-13 DIAGNOSIS — I2510 Atherosclerotic heart disease of native coronary artery without angina pectoris: Secondary | ICD-10-CM | POA: Diagnosis not present

## 2014-03-13 DIAGNOSIS — R079 Chest pain, unspecified: Secondary | ICD-10-CM | POA: Diagnosis not present

## 2014-03-13 DIAGNOSIS — R5383 Other fatigue: Secondary | ICD-10-CM | POA: Diagnosis not present

## 2014-03-13 DIAGNOSIS — E785 Hyperlipidemia, unspecified: Secondary | ICD-10-CM | POA: Diagnosis not present

## 2014-03-13 DIAGNOSIS — E119 Type 2 diabetes mellitus without complications: Secondary | ICD-10-CM | POA: Diagnosis not present

## 2014-03-13 DIAGNOSIS — I509 Heart failure, unspecified: Secondary | ICD-10-CM | POA: Diagnosis not present

## 2014-03-13 DIAGNOSIS — I4891 Unspecified atrial fibrillation: Secondary | ICD-10-CM | POA: Diagnosis not present

## 2014-03-13 DIAGNOSIS — R5381 Other malaise: Secondary | ICD-10-CM | POA: Diagnosis not present

## 2014-03-13 DIAGNOSIS — I252 Old myocardial infarction: Secondary | ICD-10-CM | POA: Diagnosis not present

## 2014-03-13 DIAGNOSIS — Z951 Presence of aortocoronary bypass graft: Secondary | ICD-10-CM | POA: Diagnosis not present

## 2014-03-13 DIAGNOSIS — Z79899 Other long term (current) drug therapy: Secondary | ICD-10-CM | POA: Diagnosis not present

## 2014-03-13 DIAGNOSIS — I10 Essential (primary) hypertension: Secondary | ICD-10-CM | POA: Diagnosis not present

## 2014-03-13 LAB — PROTIME-INR
INR: 2.4
PROTHROMBIN TIME: 25.3 s — AB (ref 11.5–14.7)

## 2014-03-13 LAB — URINALYSIS, COMPLETE
BACTERIA: NONE SEEN
BILIRUBIN, UR: NEGATIVE
Blood: NEGATIVE
GLUCOSE, UR: NEGATIVE mg/dL (ref 0–75)
Ketone: NEGATIVE
LEUKOCYTE ESTERASE: NEGATIVE
Nitrite: NEGATIVE
Ph: 5 (ref 4.5–8.0)
Protein: 30
RBC,UR: 1 /HPF (ref 0–5)
Specific Gravity: 1.018 (ref 1.003–1.030)
WBC UR: 1 /HPF (ref 0–5)

## 2014-03-13 LAB — CBC
HCT: 38.3 % — ABNORMAL LOW (ref 40.0–52.0)
HGB: 13.1 g/dL (ref 13.0–18.0)
MCH: 30.8 pg (ref 26.0–34.0)
MCHC: 34.2 g/dL (ref 32.0–36.0)
MCV: 90 fL (ref 80–100)
PLATELETS: 107 10*3/uL — AB (ref 150–440)
RBC: 4.25 10*6/uL — ABNORMAL LOW (ref 4.40–5.90)
RDW: 15.3 % — AB (ref 11.5–14.5)
WBC: 6 10*3/uL (ref 3.8–10.6)

## 2014-03-13 LAB — TROPONIN I: Troponin-I: 0.04 ng/mL

## 2014-03-13 LAB — CK TOTAL AND CKMB (NOT AT ARMC)
CK, TOTAL: 88 U/L
CK-MB: 1.4 ng/mL (ref 0.5–3.6)

## 2014-03-13 LAB — COMPREHENSIVE METABOLIC PANEL
ANION GAP: 7 (ref 7–16)
Albumin: 3.8 g/dL (ref 3.4–5.0)
Alkaline Phosphatase: 47 U/L
BUN: 22 mg/dL — AB (ref 7–18)
Bilirubin,Total: 0.6 mg/dL (ref 0.2–1.0)
CALCIUM: 8.6 mg/dL (ref 8.5–10.1)
CHLORIDE: 104 mmol/L (ref 98–107)
CO2: 25 mmol/L (ref 21–32)
Creatinine: 1.55 mg/dL — ABNORMAL HIGH (ref 0.60–1.30)
EGFR (African American): 50 — ABNORMAL LOW
GFR CALC NON AF AMER: 43 — AB
Glucose: 161 mg/dL — ABNORMAL HIGH (ref 65–99)
Osmolality: 279 (ref 275–301)
POTASSIUM: 4 mmol/L (ref 3.5–5.1)
SGOT(AST): 42 U/L — ABNORMAL HIGH (ref 15–37)
SGPT (ALT): 41 U/L (ref 12–78)
SODIUM: 136 mmol/L (ref 136–145)
Total Protein: 7 g/dL (ref 6.4–8.2)

## 2014-03-16 ENCOUNTER — Encounter: Payer: Self-pay | Admitting: Pulmonary Disease

## 2014-03-21 ENCOUNTER — Encounter: Payer: Self-pay | Admitting: Pulmonary Disease

## 2014-03-21 ENCOUNTER — Ambulatory Visit (INDEPENDENT_AMBULATORY_CARE_PROVIDER_SITE_OTHER): Payer: Medicare Other | Admitting: Pulmonary Disease

## 2014-03-21 ENCOUNTER — Other Ambulatory Visit: Payer: Self-pay | Admitting: Cardiovascular Disease

## 2014-03-21 VITALS — BP 134/64 | HR 55 | Temp 97.5°F | Ht 67.0 in | Wt 169.0 lb

## 2014-03-21 DIAGNOSIS — R599 Enlarged lymph nodes, unspecified: Secondary | ICD-10-CM

## 2014-03-21 DIAGNOSIS — J4489 Other specified chronic obstructive pulmonary disease: Secondary | ICD-10-CM

## 2014-03-21 DIAGNOSIS — J449 Chronic obstructive pulmonary disease, unspecified: Secondary | ICD-10-CM

## 2014-03-21 DIAGNOSIS — R590 Localized enlarged lymph nodes: Secondary | ICD-10-CM

## 2014-03-21 NOTE — Assessment & Plan Note (Signed)
[name] had what sounds like a mild viral infection last week which is improving.  He is feeling better today and he is not wheezing so I don't see a good reason to use steroids or antibiotics right now.  He tends to get pneumonia in his left lung, but today's exam is unchanged from prior (crackles left lower lobe).  Given the recent antibiotic associated diarrhea and his clinical improvement I am not going to call in an antibiotic.  Plan: -continue symbicort and spiriva -f/u 3-4 months

## 2014-03-21 NOTE — Patient Instructions (Signed)
We will schedule a CT scan of your chest in one month to evaluate your lymph nodes in your chest Keep taking your medicines as you are doing We will see you back in 3-4 months or sooner if needed

## 2014-03-21 NOTE — Assessment & Plan Note (Signed)
We have noted persistent mediastinal lymphadenopathy which improved slightly on the last study.  This has been felt to be reactive.  Plan: -repeat CT chest next month after this URI has resolved.

## 2014-03-21 NOTE — Progress Notes (Signed)
Subjective:    Patient ID: [name], male    DOB: 1941-04-20, 73 y.o.   MRN: [id number]  Synopsis: [name] was first seen in the [facility] office in 10/2012 for COPD.  He had been admitted to the hospital for bronchitis in months prior.  His spirometry showed clear obstruction and an FEV1 of 48% predicted.  He also has a history of a CABG and Mitral valve replacement.  HPI   11/09/2012 ROV -- [name] says that he has not felt any better since he saw [place] last. He went to the [place] office for an acute visit and was given prednisone but he says it didn't help.  He is coughing up dark brown sputum in the mornings, not as much throughout the day.  He says that he has been noticing "gurgling" a lot while sitting or lying still.  He often has to get up in the middle of the night because of this.  He gets relief from the albuterol neb he has at home. He has been using the spiriva and the symbicort but he's not sure if they help.  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  He has not had chest pain and he has not had leg swelling.  He has not been choking on food or noticing food going down the wrong pipe.  No nausea or vomiting.  11/23/2012 ROV -- [name] has been doing great. Since the last visit he has had complete resolution of the cough, wheezing, and shortness of breath. He took all of the Levaquin and says that it started working within hours of the first pill. He has not been using the nebulized bronchodilator or inhaled steroid because he thought he didn't need it. He is taking the Spiriva. His flu shot is up-to-date. He does not exercise on a regular basis that he stays active at home. He is currently undergoing a workup for cardiac ischemia by Dr. Rockey Situ.  03/01/2013 ROV -- [name] is doing very well.  Since our last visit he has been breathing much better and is now only using spiriva daily.  He says  that this drug really makes a difference for him.  He is having difficulty paying for it however.  He also notes some dry mouth at night and cough in the mornings.  No sinus symptoms.  He is not using his oxygen.  No weight change, chest pain.    08/22/2013 ROV > [name] says the has been doing well since the last visit. Had one epsode of increased wheezing, he self treated with increased breathing treaments and it went away.  He had a swallowing study which said that he had some food entering the valecula, but none went into his lungs. He is having shots in his eyes right now for macular degeneration.  Still taking Spiriva, doesn't really use it daily.  His weight has been stable.  Using oxygen when he feels like he needs it 2 L with exertion.    12/19/2013 Acute CY> Pneumonia, aspiration? Rx Levaquin, aspiration precautions  01/16/2014 ROV >> [name] ended up seeing Dr. Annamaria Boots in [place] for shortness of breath, cough, streaky hemoptysis which have since improved after taking the Levaquin.  However, he says the has had some intermittent episodes of chest tightness, wheezing and coughing.  On those days he has been using his nebulizers more and apparently  some antibiotic leftover from his wife (Levaquin).  He use had to take one about two weeks ago and it knocked out the cough.  He is much better but he continues to wheeze some.  He has been using albuterol on a prn basis.  He just uses the oxygen on a prn basis but he has been using it more in the last few months.   03/21/2014 ROV >> Doctor recently had to go to the [facility] ED for evaluation of leg swelling which was initially worrisome for a DVT.  He didn't have a DVT so he was treated with antibiotics which gave him diarrhea.  He had to go to the ED for dehydration and was given IVF.  This was last week.  He also had a cough and cold at the same time with wheezing and coughing up mucus.  It has gotten better in the last few days.  He has seen less mucus and  his mucus has started to clear up.  He has not had fevers and chills.  He continues to take the albuterol three times a day.  He continues Spiriva.    Past Medical History  Diagnosis Date  . Atrial fibrillation     chronic  . Hypertension   . Hyperkalemia   . Acute systolic heart failure   . Coronary artery disease     Cath 2005- patent LIMA-LAD, SVG-OM grafts, moderate MR  . Prostate cancer 04/2009  . Hyperlipidemia   . COPD (chronic obstructive pulmonary disease)     URI 9/12  . DM type 2 (diabetes mellitus, type 2)   . PNA (pneumonia) 03/2010  . Glaucoma   . Hard of hearing   . Myocardial infarction   . CHF (congestive heart failure)   . Gout   . Adenomatous polyps   . Diverticulosis     left side   . H/O mitral valve prolapse   . Sigmoid diverticulitis     perforated      Review of Systems  Constitutional: Negative for fever, chills and fatigue.  HENT: Negative for congestion, postnasal drip and rhinorrhea.   Respiratory: Positive for cough. Negative for choking and shortness of breath.   Cardiovascular: Negative for chest pain, palpitations and leg swelling.  Gastrointestinal: Negative for nausea, abdominal pain, diarrhea, constipation, abdominal distention and rectal pain.       Objective:   Physical Exam   Filed Vitals:   03/21/14 1325  BP: 134/64  Pulse: 55  Temp: 97.5 F (36.4 C)  TempSrc: Oral  Height: 5\' 7"  (1.702 m)  Weight: 169 lb (76.658 kg)  SpO2: 95%  RA  Gen:  no acute distress HEENT: NCAT, EOMi, OP clear PULM: good air movement, no wheezing, crackles left lower lobe CV: Irreg Irreg, mechanical S1, no JVD AB: BS+, soft, nontender, no hsm Ext: warm, no edema, no clubbing, no cyanosis  10/25/2012 CXR >> cardiomegally, no clear infiltrate 10/25/2012 Spirometry >> clear obstruction on flow volume loop, FEV1 48% predicted 11/2012 CT chest >> LLL pneumonia, enlarged mediastinal lymph nodes 02/22/2013 CT chest [facility] >> complete resolution of  pneumonia; mediastinal lymphadenopathy persists, 1.7 cm 4R node, 1.0cm 7 node 08/2013 CT chest > improved mediastinal lymphadenopathy 12/19/2013 > bilateral fluffy infiltrates consistent with pneumonia      Assessment & Plan:   COPD (chronic obstructive pulmonary disease) Tevin had what sounds like a mild viral infection last week which is improving.  He is feeling better today and he is not wheezing so  I don't see a good reason to use steroids or antibiotics right now.  He tends to get pneumonia in his left lung, but today's exam is unchanged from prior (crackles left lower lobe).  Given the recent antibiotic associated diarrhea and his clinical improvement I am not going to call in an antibiotic.  Plan: -continue symbicort and spiriva -f/u 3-4 months  Mediastinal lymphadenopathy We have noted persistent mediastinal lymphadenopathy which improved slightly on the last study.  This has been felt to be reactive.  Plan: -repeat CT chest next month after this URI has resolved.    Updated Medication List Outpatient Encounter Prescriptions as of 03/21/2014  Medication Sig  . carvedilol (COREG) 12.5 MG tablet Take 1 tablet (12.5 mg total) by mouth 2 (two) times daily with a meal.  . Cholecalciferol (VITAMIN D3) 1000 UNITS CAPS Take 1 capsule by mouth daily.    Marland Kitchen dextromethorphan-guaiFENesin (MUCINEX DM) 30-600 MG per 12 hr tablet Take 1 tablet by mouth every 12 (twelve) hours.  Marland Kitchen DIGOX 125 MCG tablet Take 1 tablet by mouth  daily.  . furosemide (LASIX) 20 MG tablet Take 1 tablet (20 mg total) by mouth daily.  Marland Kitchen losartan (COZAAR) 100 MG tablet Take one-half tablet by  mouth daily  . metFORMIN (GLUCOPHAGE) 500 MG tablet Take 500 mg by mouth 2 (two) times daily with a meal.   . OVER THE COUNTER MEDICATION Take 1 tablet by mouth daily. Preservision Eye Vitamins  . simvastatin (ZOCOR) 40 MG tablet Take 40 mg by mouth at bedtime.   . Tamsulosin HCl (FLOMAX) 0.4 MG CAPS Take 0.4 mg by mouth daily.    Marland Kitchen tiotropium (SPIRIVA HANDIHALER) 18 MCG inhalation capsule Place 1 capsule (18 mcg total) into inhaler and inhale daily.  Marland Kitchen warfarin (COUMADIN) 2.5 MG tablet Take as directed  . [DISCONTINUED] levofloxacin (LEVAQUIN) 750 MG tablet Take 1 tablet (750 mg total) by mouth daily.  . [DISCONTINUED] predniSONE (DELTASONE) 10 MG tablet 40mg  for 3 days, 30mg  for 3 days, 20mg  for two days,  10mg  for 2 days

## 2014-03-21 NOTE — Telephone Encounter (Signed)
Please review and refill, Thank You. 

## 2014-03-22 ENCOUNTER — Ambulatory Visit (INDEPENDENT_AMBULATORY_CARE_PROVIDER_SITE_OTHER): Payer: Medicare Other

## 2014-03-22 DIAGNOSIS — Z9889 Other specified postprocedural states: Secondary | ICD-10-CM

## 2014-03-22 DIAGNOSIS — Z7901 Long term (current) use of anticoagulants: Secondary | ICD-10-CM

## 2014-03-22 DIAGNOSIS — I059 Rheumatic mitral valve disease, unspecified: Secondary | ICD-10-CM | POA: Diagnosis not present

## 2014-03-22 DIAGNOSIS — I4891 Unspecified atrial fibrillation: Secondary | ICD-10-CM | POA: Diagnosis not present

## 2014-03-22 DIAGNOSIS — Z5181 Encounter for therapeutic drug level monitoring: Secondary | ICD-10-CM

## 2014-03-22 LAB — POCT INR: INR: 3.8

## 2014-04-10 DIAGNOSIS — H353290 Exudative age-related macular degeneration, unspecified eye, stage unspecified: Secondary | ICD-10-CM | POA: Diagnosis not present

## 2014-04-12 ENCOUNTER — Ambulatory Visit (INDEPENDENT_AMBULATORY_CARE_PROVIDER_SITE_OTHER): Payer: Medicare Other

## 2014-04-12 DIAGNOSIS — Z9889 Other specified postprocedural states: Secondary | ICD-10-CM | POA: Diagnosis not present

## 2014-04-12 DIAGNOSIS — I4891 Unspecified atrial fibrillation: Secondary | ICD-10-CM | POA: Diagnosis not present

## 2014-04-12 DIAGNOSIS — I059 Rheumatic mitral valve disease, unspecified: Secondary | ICD-10-CM | POA: Diagnosis not present

## 2014-04-12 DIAGNOSIS — Z7901 Long term (current) use of anticoagulants: Secondary | ICD-10-CM | POA: Diagnosis not present

## 2014-04-12 DIAGNOSIS — Z5181 Encounter for therapeutic drug level monitoring: Secondary | ICD-10-CM

## 2014-04-12 LAB — POCT INR: INR: 2.8

## 2014-04-19 DIAGNOSIS — E119 Type 2 diabetes mellitus without complications: Secondary | ICD-10-CM | POA: Diagnosis not present

## 2014-04-19 DIAGNOSIS — Z79899 Other long term (current) drug therapy: Secondary | ICD-10-CM | POA: Diagnosis not present

## 2014-04-20 ENCOUNTER — Telehealth (INDEPENDENT_AMBULATORY_CARE_PROVIDER_SITE_OTHER): Payer: Self-pay

## 2014-04-20 NOTE — Telephone Encounter (Signed)
Pt's wife trying to get 3 month refill of ostomy supplies from [place].  They told her that they would need to speak with out office before any refill was shipped.  I explained to Ms. Rash that we receive faxed refill requests from [facility] for many of our patients.  I asked her to contact them and request they send [place] a fax with any inquiries they have.  Pt agreed to do so.  She has not had this problem with [facility] before.

## 2014-04-25 DIAGNOSIS — I2510 Atherosclerotic heart disease of native coronary artery without angina pectoris: Secondary | ICD-10-CM | POA: Diagnosis not present

## 2014-04-25 DIAGNOSIS — I10 Essential (primary) hypertension: Secondary | ICD-10-CM | POA: Diagnosis not present

## 2014-04-25 DIAGNOSIS — J449 Chronic obstructive pulmonary disease, unspecified: Secondary | ICD-10-CM | POA: Diagnosis not present

## 2014-04-25 DIAGNOSIS — E1129 Type 2 diabetes mellitus with other diabetic kidney complication: Secondary | ICD-10-CM | POA: Diagnosis not present

## 2014-04-28 ENCOUNTER — Ambulatory Visit: Payer: Self-pay | Admitting: Pulmonary Disease

## 2014-04-28 DIAGNOSIS — R599 Enlarged lymph nodes, unspecified: Secondary | ICD-10-CM | POA: Diagnosis not present

## 2014-04-28 DIAGNOSIS — C61 Malignant neoplasm of prostate: Secondary | ICD-10-CM | POA: Diagnosis not present

## 2014-05-01 ENCOUNTER — Encounter: Payer: Self-pay | Admitting: Pulmonary Disease

## 2014-05-01 ENCOUNTER — Telehealth: Payer: Self-pay

## 2014-05-01 NOTE — Telephone Encounter (Signed)
Spoke with pt, he is aware of results.  Nothing further needed. 

## 2014-05-01 NOTE — Telephone Encounter (Signed)
Message copied by Len Blalock on Mon May 01, 2014 11:48 AM ------      Message from: Simonne Maffucci B      Created: Mon May 01, 2014  8:50 AM       A,            Please let him know that his CT chest looked OK            Thanks      B ------

## 2014-05-04 DIAGNOSIS — H4010X0 Unspecified open-angle glaucoma, stage unspecified: Secondary | ICD-10-CM | POA: Diagnosis not present

## 2014-05-04 DIAGNOSIS — H2510 Age-related nuclear cataract, unspecified eye: Secondary | ICD-10-CM | POA: Diagnosis not present

## 2014-05-10 ENCOUNTER — Ambulatory Visit (INDEPENDENT_AMBULATORY_CARE_PROVIDER_SITE_OTHER): Payer: Medicare Other

## 2014-05-10 DIAGNOSIS — I4891 Unspecified atrial fibrillation: Secondary | ICD-10-CM | POA: Diagnosis not present

## 2014-05-10 DIAGNOSIS — Z7901 Long term (current) use of anticoagulants: Secondary | ICD-10-CM | POA: Diagnosis not present

## 2014-05-10 DIAGNOSIS — Z9889 Other specified postprocedural states: Secondary | ICD-10-CM

## 2014-05-10 DIAGNOSIS — I059 Rheumatic mitral valve disease, unspecified: Secondary | ICD-10-CM

## 2014-05-10 DIAGNOSIS — Z5181 Encounter for therapeutic drug level monitoring: Secondary | ICD-10-CM

## 2014-05-10 LAB — POCT INR: INR: 3.1

## 2014-05-22 DIAGNOSIS — H353290 Exudative age-related macular degeneration, unspecified eye, stage unspecified: Secondary | ICD-10-CM | POA: Diagnosis not present

## 2014-05-31 DIAGNOSIS — H2510 Age-related nuclear cataract, unspecified eye: Secondary | ICD-10-CM | POA: Diagnosis not present

## 2014-06-14 ENCOUNTER — Ambulatory Visit: Payer: Self-pay | Admitting: Ophthalmology

## 2014-06-14 DIAGNOSIS — I4891 Unspecified atrial fibrillation: Secondary | ICD-10-CM | POA: Diagnosis not present

## 2014-06-14 DIAGNOSIS — Z9861 Coronary angioplasty status: Secondary | ICD-10-CM | POA: Diagnosis not present

## 2014-06-14 DIAGNOSIS — H2510 Age-related nuclear cataract, unspecified eye: Secondary | ICD-10-CM | POA: Diagnosis not present

## 2014-06-14 DIAGNOSIS — Z951 Presence of aortocoronary bypass graft: Secondary | ICD-10-CM | POA: Diagnosis not present

## 2014-06-14 DIAGNOSIS — H259 Unspecified age-related cataract: Secondary | ICD-10-CM | POA: Diagnosis not present

## 2014-06-14 DIAGNOSIS — I2510 Atherosclerotic heart disease of native coronary artery without angina pectoris: Secondary | ICD-10-CM | POA: Diagnosis not present

## 2014-06-14 DIAGNOSIS — Z87891 Personal history of nicotine dependence: Secondary | ICD-10-CM | POA: Diagnosis not present

## 2014-06-14 DIAGNOSIS — Z885 Allergy status to narcotic agent status: Secondary | ICD-10-CM | POA: Diagnosis not present

## 2014-06-14 DIAGNOSIS — Z79899 Other long term (current) drug therapy: Secondary | ICD-10-CM | POA: Diagnosis not present

## 2014-06-14 DIAGNOSIS — E7800 Pure hypercholesterolemia, unspecified: Secondary | ICD-10-CM | POA: Diagnosis not present

## 2014-06-14 DIAGNOSIS — J449 Chronic obstructive pulmonary disease, unspecified: Secondary | ICD-10-CM | POA: Diagnosis not present

## 2014-06-14 DIAGNOSIS — Z882 Allergy status to sulfonamides status: Secondary | ICD-10-CM | POA: Diagnosis not present

## 2014-06-14 DIAGNOSIS — I509 Heart failure, unspecified: Secondary | ICD-10-CM | POA: Diagnosis not present

## 2014-06-14 DIAGNOSIS — Z7901 Long term (current) use of anticoagulants: Secondary | ICD-10-CM | POA: Diagnosis not present

## 2014-06-14 DIAGNOSIS — I252 Old myocardial infarction: Secondary | ICD-10-CM | POA: Diagnosis not present

## 2014-06-14 DIAGNOSIS — Z8546 Personal history of malignant neoplasm of prostate: Secondary | ICD-10-CM | POA: Diagnosis not present

## 2014-06-14 DIAGNOSIS — E119 Type 2 diabetes mellitus without complications: Secondary | ICD-10-CM | POA: Diagnosis not present

## 2014-06-14 DIAGNOSIS — Z8673 Personal history of transient ischemic attack (TIA), and cerebral infarction without residual deficits: Secondary | ICD-10-CM | POA: Diagnosis not present

## 2014-06-14 DIAGNOSIS — I10 Essential (primary) hypertension: Secondary | ICD-10-CM | POA: Diagnosis not present

## 2014-06-19 ENCOUNTER — Ambulatory Visit (INDEPENDENT_AMBULATORY_CARE_PROVIDER_SITE_OTHER): Payer: Medicare Other | Admitting: Pulmonary Disease

## 2014-06-19 ENCOUNTER — Telehealth: Payer: Self-pay | Admitting: Pulmonary Disease

## 2014-06-19 ENCOUNTER — Ambulatory Visit (INDEPENDENT_AMBULATORY_CARE_PROVIDER_SITE_OTHER)
Admission: RE | Admit: 2014-06-19 | Discharge: 2014-06-19 | Disposition: A | Discharge status: Home / Self Care | Payer: Medicare Other | Source: Ambulatory Visit | Attending: Pulmonary Disease | Admitting: Pulmonary Disease

## 2014-06-19 ENCOUNTER — Ambulatory Visit (INDEPENDENT_AMBULATORY_CARE_PROVIDER_SITE_OTHER): Payer: Medicare Other | Admitting: Pharmacist

## 2014-06-19 ENCOUNTER — Telehealth: Payer: Self-pay | Admitting: Internal Medicine

## 2014-06-19 ENCOUNTER — Encounter: Payer: Self-pay | Admitting: Pulmonary Disease

## 2014-06-19 VITALS — BP 134/70 | HR 49 | Ht 67.0 in | Wt 170.0 lb

## 2014-06-19 DIAGNOSIS — R05 Cough: Secondary | ICD-10-CM | POA: Diagnosis not present

## 2014-06-19 DIAGNOSIS — I4891 Unspecified atrial fibrillation: Secondary | ICD-10-CM

## 2014-06-19 DIAGNOSIS — Z7901 Long term (current) use of anticoagulants: Secondary | ICD-10-CM

## 2014-06-19 DIAGNOSIS — I059 Rheumatic mitral valve disease, unspecified: Secondary | ICD-10-CM

## 2014-06-19 DIAGNOSIS — Z9889 Other specified postprocedural states: Secondary | ICD-10-CM

## 2014-06-19 DIAGNOSIS — J189 Pneumonia, unspecified organism: Secondary | ICD-10-CM

## 2014-06-19 DIAGNOSIS — R0602 Shortness of breath: Secondary | ICD-10-CM | POA: Diagnosis not present

## 2014-06-19 DIAGNOSIS — R059 Cough, unspecified: Secondary | ICD-10-CM | POA: Diagnosis not present

## 2014-06-19 DIAGNOSIS — Z5181 Encounter for therapeutic drug level monitoring: Secondary | ICD-10-CM

## 2014-06-19 LAB — POCT INR: INR: 3.5

## 2014-06-19 MED ORDER — LEVOFLOXACIN 750 MG PO TABS: 750.0000 mg | ORAL_TABLET | Freq: Every day | ORAL | Status: DC

## 2014-06-19 MED ORDER — LEVOFLOXACIN 750 MG PO TABS: 750.0000 mg | ORAL_TABLET | Freq: Every day | ORAL | Status: AC

## 2014-06-19 NOTE — Telephone Encounter (Signed)
Called, spoke with pt - C/o constant, intense, dull pain under left side and cough with increased mucus production.  Symptoms started yesterday.  Mucus is white.  Felt feverish last night.  No increased SOB.  O2 sat 97% RA.  Requesting OV - we have scheduled pt to see BQ today at [facility] office at 3:15 pm -- pt aware.

## 2014-06-19 NOTE — Patient Instructions (Signed)
Take the levaquin for a week If you don't get better in 2-3 days or if you get worse, go to the ED We will see you back in 4 weeks or sooner if needed

## 2014-06-19 NOTE — Progress Notes (Signed)
Quick Note:  lmtcb X1 to relay results. ______ 

## 2014-06-19 NOTE — Assessment & Plan Note (Signed)
[name] has a history of recurrent pneumonia in the left lung which is often difficult to see on routine chest radiographs but visible on CT chest. His symptoms today are consistent with his multiple previous episodes of pneumonia. Fortunately, his vital signs are normal. I explained to him that I think the most likely etiology is pneumonia but if he does not respond to therapy as expected in the next 2-3 days then he needs to come back and be seen either by [place] or by another physician.  Plan: -Treat as Community acquired pneumonia with Levaquin for one week -If worse or no improvement in 2-3 days then come back to our clinic or to the emergency room - Followup one month

## 2014-06-19 NOTE — Progress Notes (Signed)
Subjective:    Patient ID: [name], male    DOB: 1941-11-12, 73 y.o.   MRN: [id number]  Synopsis: [name] was first seen in the [facility] Pulmonary office in 10/2012 for COPD.  He had been admitted to the hospital for bronchitis in months prior.  His spirometry showed clear obstruction and an FEV1 of 48% predicted.  He also has a history of a CABG and Mitral valve replacement.  HPI    Chief Complaint  Patient presents with  . Acute Visit    Pt c/o throbbing pain on L side at base of ribcage X1 day.  Worse when coughing.  Prod cough with white mucous.     06/19/2014 ROV > [name] returns to clinic today complaining of fairly abrupt onset of left-sided chest pain, copious amounts of mucus production, and a low grade fever. He has generalized malaise. He has not had significant increase in shortness of breath. He says this feels very atypical for his multiple recurrent episodes of pneumonia in the past. He said the symptoms started on Saturday (2 days ago) and have gradually increased since then. He has not had nausea, vomiting, diarrhea, or change in his bowel habits. He does not have belly pain.  Past Medical History  Diagnosis Date  . Atrial fibrillation     chronic  . Hypertension   . Hyperkalemia   . Acute systolic heart failure   . Coronary artery disease     Cath 2005- patent LIMA-LAD, SVG-OM grafts, moderate MR  . Prostate cancer 04/2009  . Hyperlipidemia   . COPD (chronic obstructive pulmonary disease)     URI 9/12  . DM type 2 (diabetes mellitus, type 2)   . PNA (pneumonia) 03/2010  . Glaucoma   . Hard of hearing   . Myocardial infarction   . CHF (congestive heart failure)   . Gout   . Adenomatous polyps   . Diverticulosis     left side   . H/O mitral valve prolapse   . Sigmoid diverticulitis     perforated      Review of Systems  Constitutional: Negative for fever, chills and fatigue.  HENT: Negative for congestion, postnasal drip and rhinorrhea.    Respiratory: Positive for cough. Negative for choking and shortness of breath.   Cardiovascular: Positive for chest pain. Negative for palpitations and leg swelling.  Gastrointestinal: Negative for nausea, abdominal pain, diarrhea, constipation, abdominal distention and rectal pain.       Objective:   Physical Exam   Filed Vitals:   06/19/14 1540  BP: 134/70  Pulse: 49  Height: 5\' 7"  (1.702 m)  Weight: 170 lb (77.111 kg)  SpO2: 99%  RA  Gen:  no acute distress HEENT: NCAT, EOMi, OP clear PULM: crackles left lower lobe, mildly tender to palpation left costal margin mid ax line CV: Irreg Irreg, mechanical S1, no JVD AB: BS+, soft, nontender, no hsm (despite tenderness over left costal margin) Ext: warm, no edema, no clubbing, no cyanosis   10/25/2012 CXR >> cardiomegally, no clear infiltrate 10/25/2012 Spirometry >> clear obstruction on flow volume loop, FEV1 48% predicted 11/2012 CT chest >> LLL pneumonia, enlarged mediastinal lymph nodes 02/22/2013 CT chest [facility] >> complete resolution of pneumonia; mediastinal lymphadenopathy persists, 1.7 cm 4R node, 1.0cm 7 node 08/2013 CT chest > improved mediastinal lymphadenopathy 12/19/2013 > bilateral fluffy infiltrates consistent with pneumonia      Assessment & Plan:   Pneumonia [name] has a history of recurrent pneumonia in  the left lung which is often difficult to see on routine chest radiographs but visible on CT chest. His symptoms today are consistent with his multiple previous episodes of pneumonia. Fortunately, his vital signs are normal. I explained to him that I think the most likely etiology is pneumonia but if he does not respond to therapy as expected in the next 2-3 days then he needs to come back and be seen either by [place] or by another physician.  Plan: -Treat as Community acquired pneumonia with Levaquin for one week -If worse or no improvement in 2-3 days then come back to our clinic or to the emergency room - Followup  one month    Updated Medication List Outpatient Encounter Prescriptions as of 06/19/2014  Medication Sig  . carvedilol (COREG) 12.5 MG tablet Take 1 tablet (12.5 mg total) by mouth 2 (two) times daily with a meal.  . Cholecalciferol (VITAMIN D3) 1000 UNITS CAPS Take 1 capsule by mouth daily.    Marland Kitchen dextromethorphan-guaiFENesin (MUCINEX DM) 30-600 MG per 12 hr tablet Take 1 tablet by mouth every 12 (twelve) hours.  Marland Kitchen DIGOX 125 MCG tablet Take 1 tablet by mouth  daily.  . furosemide (LASIX) 20 MG tablet Take 1 tablet (20 mg total) by mouth daily.  Marland Kitchen losartan (COZAAR) 100 MG tablet Take one-half tablet by  mouth daily  . metFORMIN (GLUCOPHAGE) 500 MG tablet Take 500 mg by mouth 2 (two) times daily with a meal.   . OVER THE COUNTER MEDICATION Take 1 tablet by mouth daily. Preservision Eye Vitamins  . simvastatin (ZOCOR) 40 MG tablet Take 40 mg by mouth at bedtime.   . Tamsulosin HCl (FLOMAX) 0.4 MG CAPS Take 0.4 mg by mouth daily.   Marland Kitchen warfarin (COUMADIN) 2.5 MG tablet Take as directed  . tiotropium (SPIRIVA HANDIHALER) 18 MCG inhalation capsule Place 1 capsule (18 mcg total) into inhaler and inhale daily.

## 2014-06-19 NOTE — Telephone Encounter (Signed)
levquin sent to mail order but pt needs tonight so sent to [facility].  Call [facility] and [facility] the mail order

## 2014-06-20 ENCOUNTER — Other Ambulatory Visit: Payer: Self-pay

## 2014-06-20 MED ORDER — TIOTROPIUM BROMIDE MONOHYDRATE 18 MCG IN CAPS
18.0000 ug | ORAL_CAPSULE | Freq: Every day | RESPIRATORY_TRACT | Status: DC
Start: 2014-06-20 — End: 2014-08-24

## 2014-06-20 NOTE — Telephone Encounter (Signed)
Called [facility] to cancel Rx for Levaquin per MW request Rx cancelled.  Nothing further needed.

## 2014-06-21 ENCOUNTER — Telehealth: Payer: Self-pay | Admitting: Pulmonary Disease

## 2014-06-21 ENCOUNTER — Encounter: Payer: Self-pay | Admitting: Pulmonary Disease

## 2014-06-21 NOTE — Telephone Encounter (Signed)
A Please let him know that this was normal but he needs to follow the instructions I gave him in clinic to take the antibiotic and to f/u if no improvement or worse. Thanks B   I spoke with patient about results and he verbalized understanding and had no questions

## 2014-07-03 DIAGNOSIS — H353290 Exudative age-related macular degeneration, unspecified eye, stage unspecified: Secondary | ICD-10-CM | POA: Diagnosis not present

## 2014-07-18 ENCOUNTER — Ambulatory Visit: Payer: Medicare Other | Admitting: Pulmonary Disease

## 2014-07-26 DIAGNOSIS — Z79899 Other long term (current) drug therapy: Secondary | ICD-10-CM | POA: Diagnosis not present

## 2014-07-26 DIAGNOSIS — E119 Type 2 diabetes mellitus without complications: Secondary | ICD-10-CM | POA: Diagnosis not present

## 2014-08-02 ENCOUNTER — Ambulatory Visit (INDEPENDENT_AMBULATORY_CARE_PROVIDER_SITE_OTHER): Payer: Medicare Other | Admitting: *Deleted

## 2014-08-02 DIAGNOSIS — Z5181 Encounter for therapeutic drug level monitoring: Secondary | ICD-10-CM

## 2014-08-02 DIAGNOSIS — I4891 Unspecified atrial fibrillation: Secondary | ICD-10-CM | POA: Diagnosis not present

## 2014-08-02 DIAGNOSIS — E1129 Type 2 diabetes mellitus with other diabetic kidney complication: Secondary | ICD-10-CM | POA: Diagnosis not present

## 2014-08-02 DIAGNOSIS — J069 Acute upper respiratory infection, unspecified: Secondary | ICD-10-CM | POA: Diagnosis not present

## 2014-08-02 DIAGNOSIS — Z7901 Long term (current) use of anticoagulants: Secondary | ICD-10-CM

## 2014-08-02 DIAGNOSIS — J449 Chronic obstructive pulmonary disease, unspecified: Secondary | ICD-10-CM | POA: Diagnosis not present

## 2014-08-02 DIAGNOSIS — I059 Rheumatic mitral valve disease, unspecified: Secondary | ICD-10-CM | POA: Diagnosis not present

## 2014-08-02 DIAGNOSIS — Z9889 Other specified postprocedural states: Secondary | ICD-10-CM | POA: Diagnosis not present

## 2014-08-02 DIAGNOSIS — I10 Essential (primary) hypertension: Secondary | ICD-10-CM | POA: Diagnosis not present

## 2014-08-02 LAB — POCT INR: INR: 3.6

## 2014-08-16 DIAGNOSIS — H353290 Exudative age-related macular degeneration, unspecified eye, stage unspecified: Secondary | ICD-10-CM | POA: Diagnosis not present

## 2014-08-24 ENCOUNTER — Encounter: Payer: Self-pay | Admitting: Pulmonary Disease

## 2014-08-24 ENCOUNTER — Ambulatory Visit (INDEPENDENT_AMBULATORY_CARE_PROVIDER_SITE_OTHER): Payer: Medicare Other | Admitting: Pulmonary Disease

## 2014-08-24 VITALS — BP 124/66 | HR 57 | Ht 67.0 in | Wt 164.0 lb

## 2014-08-24 DIAGNOSIS — J438 Other emphysema: Secondary | ICD-10-CM | POA: Diagnosis not present

## 2014-08-24 DIAGNOSIS — R599 Enlarged lymph nodes, unspecified: Secondary | ICD-10-CM | POA: Diagnosis not present

## 2014-08-24 DIAGNOSIS — J181 Lobar pneumonia, unspecified organism: Principal | ICD-10-CM

## 2014-08-24 DIAGNOSIS — J189 Pneumonia, unspecified organism: Secondary | ICD-10-CM | POA: Diagnosis not present

## 2014-08-24 DIAGNOSIS — R590 Localized enlarged lymph nodes: Secondary | ICD-10-CM

## 2014-08-24 MED ORDER — TIOTROPIUM BROMIDE MONOHYDRATE 2.5 MCG/ACT IN AERS: 2.0000 | INHALATION_SPRAY | Freq: Every day | RESPIRATORY_TRACT | Status: DC

## 2014-08-24 NOTE — Assessment & Plan Note (Signed)
He had an exacerbation not long after the episode of pneumonia. Currently he is doing well.  Plan: -Flu shot ASAP -Continue oxygen with exertion -Continue Spiriva, but will change to respimat inhaler as he has had trouble with many of the capsules failing recently

## 2014-08-24 NOTE — Progress Notes (Signed)
Subjective:    Patient ID: [name], male    DOB: 1941/07/14, 73 y.o.   MRN: [id number]  Synopsis: Dio Giller was first seen in the [facility] office in 10/2012 for COPD.  He had been admitted to the hospital for bronchitis in months prior.  His spirometry showed clear obstruction and an FEV1 of 48% predicted.  He also has a history of a CABG and Mitral valve replacement.  HPI    Chief Complaint  Patient presents with  . Follow-up    Pt states he is doing well today.  C/o wheezing, sob with exertion when he doesn't take his Spiriva.  No other complaints.    08/24/2014 ROV > Wyett has been doing better since the last visit.  He said about three days into the antibiotic he started feeling a lot better with more energy.  He was still coughing some and he saw his PCP on 8/1 and he was prescribed prednisone which helped. His shortness of breath has improved quite a bit.  He is still using Spiriva but he has had several that felt empty.    Past Medical History  Diagnosis Date  . Atrial fibrillation     chronic  . Hypertension   . Hyperkalemia   . Acute systolic heart failure   . Coronary artery disease     Cath 2005- patent LIMA-LAD, SVG-OM grafts, moderate MR  . Prostate cancer 04/2009  . Hyperlipidemia   . COPD (chronic obstructive pulmonary disease)     URI 9/12  . DM type 2 (diabetes mellitus, type 2)   . PNA (pneumonia) 03/2010  . Glaucoma   . Hard of hearing   . Myocardial infarction   . CHF (congestive heart failure)   . Gout   . Adenomatous polyps   . Diverticulosis     left side   . H/O mitral valve prolapse   . Sigmoid diverticulitis     perforated      Review of Systems  Constitutional: Negative for fever, chills and fatigue.  HENT: Negative for congestion, postnasal drip and rhinorrhea.   Respiratory: Positive for cough. Negative for choking and shortness of breath.   Cardiovascular: Positive for chest pain. Negative for palpitations and leg  swelling.  Gastrointestinal: Negative for nausea, abdominal pain, diarrhea, constipation, abdominal distention and rectal pain.       Objective:   Physical Exam   Filed Vitals:   08/24/14 0952  BP: 124/66  Pulse: 57  Height: 5\' 7"  (1.702 m)  Weight: 164 lb (74.39 kg)  SpO2: 98%  RA  Gen:  no acute distress HEENT: NCAT, EOMi, OP clear PULM: Few crackles right base otherwise clear  CV: Irreg Irreg, mechanical S1, no JVD AB: BS+, soft, nontender, no hsm Ext: warm, no edema, no clubbing, no cyanosis   10/25/2012 CXR >> cardiomegally, no clear infiltrate 10/25/2012 Spirometry >> clear obstruction on flow volume loop, FEV1 48% predicted 11/2012 CT chest >> LLL pneumonia, enlarged mediastinal lymph nodes 02/22/2013 CT chest [facility] >> complete resolution of pneumonia; mediastinal lymphadenopathy persists, 1.7 cm 4R node, 1.0cm 7 node 08/2013 CT chest > improved mediastinal lymphadenopathy 12/19/2013 > bilateral fluffy infiltrates consistent with pneumonia May 2015 CT chest> no change in chronic mediastinal lymphadenopathy     Assessment & Plan:   Pneumonia He has recovered well from this problem.  COPD (chronic obstructive pulmonary disease) He had an exacerbation not long after the episode of pneumonia. Currently he is doing well.  Plan: -  Flu shot ASAP -Continue oxygen with exertion -Continue Spiriva, but will change to respimat inhaler as he has had trouble with many of the capsules failing recently  Mediastinal lymphadenopathy His most recent CT chest in May of 2015 showed that his mediastinal lymphadenopathy has not changed in 2 years. At this point I don't recommend further imaging  as there has been no further growth or sign of malignancy.    Updated Medication List Outpatient Encounter Prescriptions as of 08/24/2014  Medication Sig  . carvedilol (COREG) 12.5 MG tablet Take 1 tablet (12.5 mg total) by mouth 2 (two) times daily with a meal.  . Cholecalciferol (VITAMIN D3)  1000 UNITS CAPS Take 1 capsule by mouth daily.    Marland Kitchen dextromethorphan-guaiFENesin (MUCINEX DM) 30-600 MG per 12 hr tablet Take 1 tablet by mouth every 12 (twelve) hours.  Marland Kitchen DIGOX 125 MCG tablet Take 1 tablet by mouth  daily.  . furosemide (LASIX) 20 MG tablet Take 1 tablet (20 mg total) by mouth daily.  Marland Kitchen losartan (COZAAR) 100 MG tablet Take one-half tablet by  mouth daily  . metFORMIN (GLUCOPHAGE) 500 MG tablet Take 500 mg by mouth 2 (two) times daily with a meal.   . OVER THE COUNTER MEDICATION Take 1 tablet by mouth daily. Preservision Eye Vitamins  . simvastatin (ZOCOR) 40 MG tablet Take 40 mg by mouth at bedtime.   . Tamsulosin HCl (FLOMAX) 0.4 MG CAPS Take 0.4 mg by mouth daily.   Marland Kitchen warfarin (COUMADIN) 2.5 MG tablet Take as directed  . [DISCONTINUED] tiotropium (SPIRIVA HANDIHALER) 18 MCG inhalation capsule Place 1 capsule (18 mcg total) into inhaler and inhale daily.  . Tiotropium Bromide Monohydrate (SPIRIVA RESPIMAT) 2.5 MCG/ACT AERS Inhale 2 puffs into the lungs daily.  . [DISCONTINUED] levofloxacin (LEVAQUIN) 750 MG tablet Take 1 tablet (750 mg total) by mouth daily. One daily stop if develop aching in joints/ muscles

## 2014-08-24 NOTE — Assessment & Plan Note (Signed)
He has recovered well from this problem.

## 2014-08-24 NOTE — Assessment & Plan Note (Signed)
His most recent CT chest in May of 2015 showed that his mediastinal lymphadenopathy has not changed in 2 years. At this point I don't recommend further imaging  as there has been no further growth or sign of malignancy.

## 2014-08-24 NOTE — Patient Instructions (Signed)
Stop taking the Spiriva Handihaler and start using the Spiriva respimat We will see you back in 3 months or sooner if needed

## 2014-09-06 ENCOUNTER — Ambulatory Visit (INDEPENDENT_AMBULATORY_CARE_PROVIDER_SITE_OTHER): Payer: Medicare Other

## 2014-09-06 DIAGNOSIS — I059 Rheumatic mitral valve disease, unspecified: Secondary | ICD-10-CM | POA: Diagnosis not present

## 2014-09-06 DIAGNOSIS — Z9889 Other specified postprocedural states: Secondary | ICD-10-CM

## 2014-09-06 DIAGNOSIS — Z7901 Long term (current) use of anticoagulants: Secondary | ICD-10-CM | POA: Diagnosis not present

## 2014-09-06 DIAGNOSIS — I4891 Unspecified atrial fibrillation: Secondary | ICD-10-CM | POA: Diagnosis not present

## 2014-09-06 DIAGNOSIS — Z5181 Encounter for therapeutic drug level monitoring: Secondary | ICD-10-CM

## 2014-09-06 LAB — POCT INR: INR: 4.3

## 2014-09-27 ENCOUNTER — Ambulatory Visit (INDEPENDENT_AMBULATORY_CARE_PROVIDER_SITE_OTHER): Payer: Medicare Other

## 2014-09-27 DIAGNOSIS — I4891 Unspecified atrial fibrillation: Secondary | ICD-10-CM | POA: Diagnosis not present

## 2014-09-27 DIAGNOSIS — Z7901 Long term (current) use of anticoagulants: Secondary | ICD-10-CM

## 2014-09-27 DIAGNOSIS — Z9889 Other specified postprocedural states: Secondary | ICD-10-CM

## 2014-09-27 DIAGNOSIS — Z5181 Encounter for therapeutic drug level monitoring: Secondary | ICD-10-CM

## 2014-09-27 DIAGNOSIS — I059 Rheumatic mitral valve disease, unspecified: Secondary | ICD-10-CM

## 2014-09-27 DIAGNOSIS — Z23 Encounter for immunization: Secondary | ICD-10-CM | POA: Diagnosis not present

## 2014-09-27 LAB — POCT INR: INR: 2.4

## 2014-10-10 ENCOUNTER — Encounter: Payer: Self-pay | Admitting: Adult Health

## 2014-10-10 ENCOUNTER — Ambulatory Visit (INDEPENDENT_AMBULATORY_CARE_PROVIDER_SITE_OTHER): Payer: Medicare Other | Admitting: Adult Health

## 2014-10-10 VITALS — BP 130/76 | HR 99 | Temp 96.7°F | Ht 67.0 in | Wt 171.8 lb

## 2014-10-10 DIAGNOSIS — J438 Other emphysema: Secondary | ICD-10-CM

## 2014-10-10 MED ORDER — LEVOFLOXACIN 500 MG PO TABS: 500.0000 mg | ORAL_TABLET | Freq: Every day | ORAL | Status: AC

## 2014-10-10 NOTE — Patient Instructions (Addendum)
Levaquin 500mg  daily for 7 days , take with food.  Mucinex DM Twice daily  As needed cough/congestion  Fluids and rest  Please let coumadin clinic know you have started on new antibiotic as it may alter your levels.  Please contact office for sooner follow up if symptoms do not improve or worsen or seek emergency care  Follow up Dr. Lake Bells in 2 months as planned and As needed

## 2014-10-10 NOTE — Assessment & Plan Note (Signed)
Flare   Plan  Levaquin 500mg  daily for 7 days , take with food.  Mucinex DM Twice daily  As needed cough/congestion  Fluids and rest  Please let coumadin clinic know you have started on new antibiotic as it may alter your levels.  Please contact office for sooner follow up if symptoms do not improve or worsen or seek emergency care  Follow up Dr. Lake Bells in 2 months as planned and As needed

## 2014-10-10 NOTE — Progress Notes (Signed)
   Subjective:    Patient ID: [name], male    DOB: [birth year], 73 y.o.   MRN: [id number]  HPI  Synopsis: Deyton Ellenbecker was first seen in the [facility] Pulmonary office in 10/2012 for COPD.    spirometry showed clear obstruction and an FEV1 of 48% predicted.  He also has a history of a CABG and Mitral valve replacement.  10/10/2014 Acute OV  Pt presents for an acute office visit.  Complains of  prod cough with dark green mucus, wheezing x 3-4days.   Using mucinex without much relief.  Denies any increased SOB, hemoptysis, chest pain, f/c/s, n/v/d No recent travel .  Remains on coumadin , last INR 2.4.  No known bleeding.      Review of Systems Constitutional:   No  weight loss, night sweats,  Fevers, chills, fatigue, or  lassitude.  HEENT:   No headaches,  Difficulty swallowing,  Tooth/dental problems, or  Sore throat,                No sneezing, itching, ear ache,  +nasal congestion, post nasal drip,   CV:  No chest pain,  Orthopnea, PND, swelling in lower extremities, anasarca, dizziness, palpitations, syncope.   GI  No heartburn, indigestion, abdominal pain, nausea, vomiting, diarrhea, change in bowel habits, loss of appetite, bloody stools.   Resp:   No chest wall deformity  Skin: no rash or lesions.  GU: no dysuria, change in color of urine, no urgency or frequency.  No flank pain, no hematuria   MS:  No joint pain or swelling.  No decreased range of motion.  No back pain.  Psych:  No change in mood or affect. No depression or anxiety.  No memory loss.         Objective:   Physical Exam GEN: A/Ox3; pleasant , NAD, well nourished   HEENT:  [place]/AT,  EACs-clear, TMs-wnl, NOSE-clear, THROAT-clear, no lesions, no postnasal drip or exudate noted.   NECK:  Supple w/ fair ROM; no JVD; normal carotid impulses w/o bruits; no thyromegaly or nodules palpated; no lymphadenopathy.  RESP  Few scattered rhonchi with no wheezing no accessory muscle use, no dullness to  percussion  CARD:  Irregular , no peripheral edema, pulses intact, no cyanosis or clubbing.  GI:   Soft & nt; nml bowel sounds; no organomegaly or masses detected.  Musco: Warm bil, no deformities or joint swelling noted.   Neuro: alert, no focal deficits noted.    Skin: Warm, no lesions or rashes         Assessment & Plan:

## 2014-10-16 DIAGNOSIS — H3532 Exudative age-related macular degeneration: Secondary | ICD-10-CM | POA: Diagnosis not present

## 2014-10-18 DIAGNOSIS — C61 Malignant neoplasm of prostate: Secondary | ICD-10-CM | POA: Diagnosis not present

## 2014-10-18 DIAGNOSIS — R339 Retention of urine, unspecified: Secondary | ICD-10-CM | POA: Diagnosis not present

## 2014-10-18 DIAGNOSIS — N358 Other urethral stricture: Secondary | ICD-10-CM | POA: Diagnosis not present

## 2014-10-18 DIAGNOSIS — N320 Bladder-neck obstruction: Secondary | ICD-10-CM | POA: Diagnosis not present

## 2014-10-25 ENCOUNTER — Ambulatory Visit (INDEPENDENT_AMBULATORY_CARE_PROVIDER_SITE_OTHER): Payer: Medicare Other

## 2014-10-25 DIAGNOSIS — Z7901 Long term (current) use of anticoagulants: Secondary | ICD-10-CM

## 2014-10-25 DIAGNOSIS — Z9889 Other specified postprocedural states: Secondary | ICD-10-CM

## 2014-10-25 DIAGNOSIS — I059 Rheumatic mitral valve disease, unspecified: Secondary | ICD-10-CM

## 2014-10-25 DIAGNOSIS — I4891 Unspecified atrial fibrillation: Secondary | ICD-10-CM | POA: Diagnosis not present

## 2014-10-25 DIAGNOSIS — Z5181 Encounter for therapeutic drug level monitoring: Secondary | ICD-10-CM | POA: Diagnosis not present

## 2014-10-25 LAB — POCT INR: INR: 2.9

## 2014-11-06 ENCOUNTER — Ambulatory Visit (INDEPENDENT_AMBULATORY_CARE_PROVIDER_SITE_OTHER): Payer: Medicare Other | Admitting: Cardiovascular Disease

## 2014-11-06 ENCOUNTER — Encounter: Payer: Self-pay | Admitting: Cardiovascular Disease

## 2014-11-06 VITALS — BP 140/82 | HR 69 | Ht 67.0 in | Wt 171.0 lb

## 2014-11-06 DIAGNOSIS — I4891 Unspecified atrial fibrillation: Secondary | ICD-10-CM

## 2014-11-06 DIAGNOSIS — E785 Hyperlipidemia, unspecified: Secondary | ICD-10-CM

## 2014-11-06 DIAGNOSIS — Z9889 Other specified postprocedural states: Secondary | ICD-10-CM

## 2014-11-06 DIAGNOSIS — E118 Type 2 diabetes mellitus with unspecified complications: Secondary | ICD-10-CM | POA: Diagnosis not present

## 2014-11-06 DIAGNOSIS — Z954 Presence of other heart-valve replacement: Secondary | ICD-10-CM

## 2014-11-06 DIAGNOSIS — I25810 Atherosclerosis of coronary artery bypass graft(s) without angina pectoris: Secondary | ICD-10-CM

## 2014-11-06 DIAGNOSIS — I10 Essential (primary) hypertension: Secondary | ICD-10-CM

## 2014-11-06 DIAGNOSIS — Z952 Presence of prosthetic heart valve: Secondary | ICD-10-CM

## 2014-11-06 NOTE — Assessment & Plan Note (Signed)
Currently with no symptoms of angina. No further workup at this time. Continue current medication regimen. 

## 2014-11-06 NOTE — Patient Instructions (Signed)
You are doing well. No medication changes were made.  We will schedule an echocardiogram to evaluate your mitral valve  Please call us if you have new issues that need to be addressed before your next appt.  Your physician wants you to follow-up in: 6 months.  You will receive a reminder letter in the mail two months in advance. If you don't receive a letter, please call our office to schedule the follow-up appointment.

## 2014-11-06 NOTE — Assessment & Plan Note (Signed)
Heart rate well controlled. Tolerating anticoagulation 

## 2014-11-06 NOTE — Assessment & Plan Note (Signed)
Blood pressure is well controlled on today's visit. No changes made to the medications. 

## 2014-11-06 NOTE — Progress Notes (Signed)
Patient ID: [name], male    DOB: 06/04/1941, 73 y.o.   MRN: [id number]  HPI Comments: [name] is a very pleasant 73 year old gentleman with a past medical history of coronary artery disease, bypass surgery with a left internal mammary to the LAD and saphenous vein graft to the OM in 1989, PCI of the RCA in 2002, COPD, mitral valve replacement with prosthetic valve on Coumadin in 2007, history of chronic atrial fibrillation, diabetes, hypertension, hyperlipidemia, PNA in 03/2010, who presents for routine followup of his prosthetic mitral valve and CAD. History of colon surgery with ostomy for diverticuli  In follow-up today, he reports that he is doing well. He walks his dog daily around the block with no new symptoms of shortness of breath or chest pain. He denies any significant chest pain. When he walks quickly or gets overexcited, he does develop shortness of breath He takes Lasix daily Otherwise has no new complaints EKG today shows atrial fibrillation with right bundle branch block, rate 69 bpm   Other past medical history hospital in September 04 2011 with atrial fibrillation with RVR, chest pain, bronchitis, COPD exacerbation. He was treated with steroids, antibiotics, rate control for his atrial fibrillation with improvement of his symptoms.    hospital January 06 2012 with shortness of breath and congestion felt to have COPD exacerbation,  nebulizers, steroids. He suffered a perforated bowel with air under his diaphragm and was transferred to [facility] and within 48 hours, had surgery performed. He presents today with an ostomy. 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 He had a 24 pound weight loss through this procedure, open abdominal wall wound healing by secondary intention.  He was discharged with underlying anemia, hematocrit 27 INR of 4  Repeat echocardiogram for shortness of  breath  starting in October showed a decline in his ejection fraction to 20-30%, worsening inferior, posterior and lateral wall hypokinesis (inferior and posterior wall are almost akinetic). Prior ejection fraction 40-45%.   stress test late 2013 showing large region of scar in the inferior and inferolateral wall consistent with prior MI, no ischemia        Outpatient Encounter Prescriptions as of 11/06/2014  Medication Sig  . carvedilol (COREG) 12.5 MG tablet Take 1 tablet (12.5 mg total) by mouth 2 (two) times daily with a meal.  . Cholecalciferol (VITAMIN D3) 1000 UNITS CAPS Take 1 capsule by mouth daily.    Marland Kitchen dextromethorphan-guaiFENesin (MUCINEX DM) 30-600 MG per 12 hr tablet Take 1 tablet by mouth every 12 (twelve) hours.  Marland Kitchen DIGOX 125 MCG tablet Take 1 tablet by mouth  daily.  . furosemide (LASIX) 20 MG tablet Take 1 tablet (20 mg total) by mouth daily.  Marland Kitchen losartan (COZAAR) 100 MG tablet Take one-half tablet by  mouth daily  . metFORMIN (GLUCOPHAGE) 500 MG tablet Take 500 mg by mouth 2 (two) times daily with a meal.   . Multiple Vitamins-Minerals (PRESERVISION/LUTEIN PO) Apply to eye at bedtime.  . simvastatin (ZOCOR) 40 MG tablet Take 40 mg by mouth at bedtime.   . Tamsulosin HCl (FLOMAX) 0.4 MG CAPS Take 0.4 mg by mouth daily.   . Tiotropium Bromide Monohydrate (SPIRIVA RESPIMAT) 2.5 MCG/ACT AERS Inhale 2 puffs into the lungs daily.  Marland Kitchen warfarin (COUMADIN) 2.5 MG tablet Take as directed  . [DISCONTINUED] OVER THE COUNTER MEDICATION Take 1 tablet by mouth daily. [place] history  History   Social History  . Marital Status: Married    Spouse Name: N/A    Number of Children: 3  . Years of Education: N/A   Occupational History  . retired Airline pilot    Social History Main Topics  . Smoking status: Former Smoker -- 4.00 packs/day for 23 years    Types: Cigarettes    Quit date: 12/23/1987  . Smokeless tobacco: Never Used  . Alcohol Use: No  . Drug  Use: No  . Sexual Activity: Not on file   Other Topics Concern  . Not on file   Social History Narrative   Lives in [place], [place] with wife.      Review of Systems  Constitutional: Negative.   HENT: Negative.   Respiratory: Negative.   Cardiovascular: Negative.   Gastrointestinal: Negative.   Musculoskeletal: Negative.   Neurological: Negative.   Psychiatric/Behavioral: Negative.   All other systems reviewed and are negative.  BP 140/82 mmHg  Pulse 69  Ht 5\' 7"  (1.702 m)  Wt 171 lb (77.565 kg)  BMI 26.78 kg/m2  Physical Exam  Constitutional: He is oriented to person, place, and time. He appears well-developed and well-nourished.  HENT:  Head: Normocephalic.  Nose: Nose normal.  Mouth/Throat: Oropharynx is clear and moist.  Eyes: Conjunctivae are normal. Pupils are equal, round, and reactive to light.  Neck: Normal range of motion. Neck supple. No JVD present.  Cardiovascular: Normal rate, regular rhythm, S1 normal, S2 normal, normal heart sounds and intact distal pulses.  An irregularly irregular rhythm present. Exam reveals no gallop and no friction rub.   No murmur heard. Pulmonary/Chest: Effort normal and breath sounds normal. No respiratory distress. He has no wheezes. He has no rales. He exhibits no tenderness.  Abdominal: Soft. Bowel sounds are normal. He exhibits no distension. There is no tenderness.  Musculoskeletal: Normal range of motion. He exhibits no edema or tenderness.  Lymphadenopathy:    He has no cervical adenopathy.  Neurological: He is alert and oriented to person, place, and time. Coordination normal.  Skin: Skin is warm and dry. No rash noted. No erythema.  Psychiatric: He has a normal mood and affect. His behavior is normal. Judgment and thought content normal.      Assessment and Plan   Nursing note and vitals reviewed.

## 2014-11-06 NOTE — Assessment & Plan Note (Signed)
We have encouraged continued exercise, careful diet management in an effort to lose weight. 

## 2014-11-06 NOTE — Assessment & Plan Note (Signed)
Cholesterol is at goal on the current lipid regimen. No changes to the medications were made.  

## 2014-11-06 NOTE — Assessment & Plan Note (Signed)
Echocardiogram has been ordered to evaluate his mechanical prosthetic valve

## 2014-11-10 DIAGNOSIS — H4011X2 Primary open-angle glaucoma, moderate stage: Secondary | ICD-10-CM | POA: Diagnosis not present

## 2014-11-13 ENCOUNTER — Other Ambulatory Visit: Payer: Self-pay

## 2014-11-13 ENCOUNTER — Other Ambulatory Visit (INDEPENDENT_AMBULATORY_CARE_PROVIDER_SITE_OTHER): Payer: Medicare Other

## 2014-11-13 DIAGNOSIS — I4891 Unspecified atrial fibrillation: Secondary | ICD-10-CM | POA: Diagnosis not present

## 2014-11-13 DIAGNOSIS — Z954 Presence of other heart-valve replacement: Secondary | ICD-10-CM

## 2014-11-13 DIAGNOSIS — Z952 Presence of prosthetic heart valve: Secondary | ICD-10-CM

## 2014-11-13 DIAGNOSIS — I25810 Atherosclerosis of coronary artery bypass graft(s) without angina pectoris: Secondary | ICD-10-CM

## 2014-11-21 ENCOUNTER — Other Ambulatory Visit: Payer: Self-pay | Admitting: Cardiovascular Disease

## 2014-11-22 ENCOUNTER — Ambulatory Visit (INDEPENDENT_AMBULATORY_CARE_PROVIDER_SITE_OTHER): Payer: Medicare Other

## 2014-11-22 DIAGNOSIS — Z7901 Long term (current) use of anticoagulants: Secondary | ICD-10-CM | POA: Diagnosis not present

## 2014-11-22 DIAGNOSIS — Z9889 Other specified postprocedural states: Secondary | ICD-10-CM

## 2014-11-22 DIAGNOSIS — Z5181 Encounter for therapeutic drug level monitoring: Secondary | ICD-10-CM

## 2014-11-22 DIAGNOSIS — I059 Rheumatic mitral valve disease, unspecified: Secondary | ICD-10-CM | POA: Diagnosis not present

## 2014-11-22 DIAGNOSIS — I4891 Unspecified atrial fibrillation: Secondary | ICD-10-CM | POA: Diagnosis not present

## 2014-11-22 LAB — POCT INR: INR: 2.1

## 2014-12-04 ENCOUNTER — Encounter: Payer: Self-pay | Admitting: Pulmonary Disease

## 2014-12-04 ENCOUNTER — Ambulatory Visit (INDEPENDENT_AMBULATORY_CARE_PROVIDER_SITE_OTHER): Payer: Medicare Other | Admitting: Pulmonary Disease

## 2014-12-04 VITALS — HR 65 | Temp 97.4°F | Ht 67.0 in | Wt 171.0 lb

## 2014-12-04 DIAGNOSIS — I25810 Atherosclerosis of coronary artery bypass graft(s) without angina pectoris: Secondary | ICD-10-CM | POA: Diagnosis not present

## 2014-12-04 DIAGNOSIS — J432 Centrilobular emphysema: Secondary | ICD-10-CM

## 2014-12-04 NOTE — Progress Notes (Signed)
Subjective:    Patient ID: [name], male    DOB: Jul 26, 1941, 73 y.o.   MRN: [id number]  Synopsis: Leeam Cedrone was first seen in the [facility] Pulmonary office in 10/2012 for COPD.  He had been admitted to the hospital for bronchitis in months prior.  His spirometry showed clear obstruction and an FEV1 of 48% predicted.  He also has a history of a CABG and Mitral valve replacement.  HPI    Chief Complaint  Patient presents with  . Follow-up    Pt c/o occasional prod cough with white mucus but no complaints today.  CAT score 9   12/04/2014 ROV> Alondra is her to see me for a follow up after he Michel Bickers Tammy earlier in the month.  He says that his cough has improved and he only produces mucus occasionally now.  He is wondering if he needs to take Spiriva every day.  He is getting around [facility] without any trouble breathing.    Past Medical History  Diagnosis Date  . Atrial fibrillation     chronic  . Hypertension   . Hyperkalemia   . Acute systolic heart failure   . Coronary artery disease     Cath 2005- patent LIMA-LAD, SVG-OM grafts, moderate MR  . Prostate cancer 04/2009  . Hyperlipidemia   . COPD (chronic obstructive pulmonary disease)     URI 9/12  . DM type 2 (diabetes mellitus, type 2)   . PNA (pneumonia) 03/2010  . Glaucoma   . Hard of hearing   . Myocardial infarction   . CHF (congestive heart failure)   . Gout   . Adenomatous polyps   . Diverticulosis     left side   . H/O mitral valve prolapse   . Sigmoid diverticulitis     perforated      Review of Systems  Constitutional: Negative for fever, chills and fatigue.  HENT: Negative for congestion, postnasal drip and rhinorrhea.   Respiratory: Positive for cough. Negative for choking and shortness of breath.   Cardiovascular: Positive for chest pain. Negative for palpitations and leg swelling.  Gastrointestinal: Negative for nausea, abdominal pain, diarrhea, constipation, abdominal distention and rectal pain.       Objective:   Physical Exam   Filed Vitals:   12/04/14 0905  Pulse: 65  Temp: 97.4 F (36.3 C)  TempSrc: Oral  Height: 5\' 7"  (1.702 m)  Weight: 171 lb (77.565 kg)  SpO2: 95%  RA  Gen:  no acute distress HEENT: NCAT, EOMi, OP clear PULM: Few crackles in bases CV: Irreg Irreg, mechanical S1, no JVD AB: BS+, soft, nontender, no hsm Ext: warm, no edema, no clubbing, no cyanosis   10/25/2012 CXR >> cardiomegally, no clear infiltrate 10/25/2012 Spirometry >> clear obstruction on flow volume loop, FEV1 48% predicted 11/2012 CT chest >> LLL pneumonia, enlarged mediastinal lymph nodes 02/22/2013 CT chest [facility] >> complete resolution of pneumonia; mediastinal lymphadenopathy persists, 1.7 cm 4R node, 1.0cm 7 node 08/2013 CT chest > improved mediastinal lymphadenopathy 12/19/2013 > bilateral fluffy infiltrates consistent with pneumonia May 2015 CT chest> no change in chronic mediastinal lymphadenopathy     Assessment & Plan:   COPD (chronic obstructive pulmonary disease) Despite a recent exacerbation, [place] has been doing well.  Today I learned that his compliance with Spiriva has been poor due to cost.  I offered to apply for financial assistance for the drug but he prefers to do this on his own.  I explained that one  of the primary benefits of Spiriva is its ability to prevent exacerbations.  Plan: -continue Spiriva with daily (not prn use) -f/u with me in 4 months    Updated Medication List Outpatient Encounter Prescriptions as of 12/04/2014  Medication Sig  . carvedilol (COREG) 12.5 MG tablet Take 1 tablet (12.5 mg total) by mouth 2 (two) times daily with a meal.  . Cholecalciferol (VITAMIN D3) 1000 UNITS CAPS Take 1 capsule by mouth daily.    Marland Kitchen dextromethorphan-guaiFENesin (MUCINEX DM) 30-600 MG per 12 hr tablet Take 1 tablet by mouth every 12 (twelve) hours.  Marland Kitchen DIGOX 125 MCG tablet Take 1 tablet by mouth  daily  . furosemide (LASIX) 20 MG tablet Take 1 tablet (20 mg total) by  mouth daily.  Marland Kitchen losartan (COZAAR) 100 MG tablet Take one-half tablet by  mouth daily  . metFORMIN (GLUCOPHAGE) 500 MG tablet Take 500 mg by mouth 2 (two) times daily with a meal.   . Multiple Vitamins-Minerals (PRESERVISION/LUTEIN PO) Apply to eye at bedtime.  . simvastatin (ZOCOR) 40 MG tablet Take 40 mg by mouth at bedtime.   . Tamsulosin HCl (FLOMAX) 0.4 MG CAPS Take 0.4 mg by mouth daily.   Marland Kitchen warfarin (COUMADIN) 2.5 MG tablet Take as directed  . Tiotropium Bromide Monohydrate (SPIRIVA RESPIMAT) 2.5 MCG/ACT AERS Inhale 2 puffs into the lungs daily. (Patient not taking: Reported on 12/04/2014)

## 2014-12-04 NOTE — Patient Instructions (Signed)
Take your Spiriva every day We will see you back in 4 months

## 2014-12-04 NOTE — Assessment & Plan Note (Signed)
Despite a recent exacerbation, [name] has been doing well.  Today I learned that his compliance with Spiriva has been poor due to cost.  I offered to apply for financial assistance for the drug but he prefers to do this on his own.  I explained that one of the primary benefits of Spiriva is its ability to prevent exacerbations.  Plan: -continue Spiriva with daily (not prn use) -f/u with me in 4 months

## 2014-12-20 ENCOUNTER — Ambulatory Visit (INDEPENDENT_AMBULATORY_CARE_PROVIDER_SITE_OTHER): Payer: Medicare Other

## 2014-12-20 ENCOUNTER — Other Ambulatory Visit: Payer: Self-pay | Admitting: Cardiovascular Disease

## 2014-12-20 DIAGNOSIS — I059 Rheumatic mitral valve disease, unspecified: Secondary | ICD-10-CM

## 2014-12-20 DIAGNOSIS — Z7901 Long term (current) use of anticoagulants: Secondary | ICD-10-CM | POA: Diagnosis not present

## 2014-12-20 DIAGNOSIS — Z9889 Other specified postprocedural states: Secondary | ICD-10-CM

## 2014-12-20 DIAGNOSIS — I4891 Unspecified atrial fibrillation: Secondary | ICD-10-CM

## 2014-12-20 DIAGNOSIS — Z5181 Encounter for therapeutic drug level monitoring: Secondary | ICD-10-CM

## 2014-12-20 LAB — POCT INR: INR: 3.5

## 2014-12-27 DIAGNOSIS — H3532 Exudative age-related macular degeneration: Secondary | ICD-10-CM | POA: Diagnosis not present

## 2015-01-17 ENCOUNTER — Ambulatory Visit (INDEPENDENT_AMBULATORY_CARE_PROVIDER_SITE_OTHER): Payer: Medicare Other

## 2015-01-17 DIAGNOSIS — I4891 Unspecified atrial fibrillation: Secondary | ICD-10-CM | POA: Diagnosis not present

## 2015-01-17 DIAGNOSIS — Z9889 Other specified postprocedural states: Secondary | ICD-10-CM | POA: Diagnosis not present

## 2015-01-17 DIAGNOSIS — Z5181 Encounter for therapeutic drug level monitoring: Secondary | ICD-10-CM

## 2015-01-17 DIAGNOSIS — I059 Rheumatic mitral valve disease, unspecified: Secondary | ICD-10-CM

## 2015-01-17 DIAGNOSIS — Z7901 Long term (current) use of anticoagulants: Secondary | ICD-10-CM

## 2015-01-17 LAB — POCT INR: INR: 3.1

## 2015-01-24 DIAGNOSIS — E1129 Type 2 diabetes mellitus with other diabetic kidney complication: Secondary | ICD-10-CM | POA: Diagnosis not present

## 2015-01-24 DIAGNOSIS — R809 Proteinuria, unspecified: Secondary | ICD-10-CM | POA: Diagnosis not present

## 2015-01-30 DIAGNOSIS — R197 Diarrhea, unspecified: Secondary | ICD-10-CM | POA: Diagnosis not present

## 2015-01-30 DIAGNOSIS — I10 Essential (primary) hypertension: Secondary | ICD-10-CM | POA: Diagnosis not present

## 2015-01-30 DIAGNOSIS — I2510 Atherosclerotic heart disease of native coronary artery without angina pectoris: Secondary | ICD-10-CM | POA: Diagnosis not present

## 2015-01-30 DIAGNOSIS — E119 Type 2 diabetes mellitus without complications: Secondary | ICD-10-CM | POA: Diagnosis not present

## 2015-02-14 ENCOUNTER — Ambulatory Visit (INDEPENDENT_AMBULATORY_CARE_PROVIDER_SITE_OTHER): Payer: Medicare Other

## 2015-02-14 DIAGNOSIS — Z7901 Long term (current) use of anticoagulants: Secondary | ICD-10-CM

## 2015-02-14 DIAGNOSIS — Z5181 Encounter for therapeutic drug level monitoring: Secondary | ICD-10-CM | POA: Diagnosis not present

## 2015-02-14 DIAGNOSIS — I4891 Unspecified atrial fibrillation: Secondary | ICD-10-CM | POA: Diagnosis not present

## 2015-02-14 DIAGNOSIS — I059 Rheumatic mitral valve disease, unspecified: Secondary | ICD-10-CM

## 2015-02-14 DIAGNOSIS — Z9889 Other specified postprocedural states: Secondary | ICD-10-CM | POA: Diagnosis not present

## 2015-02-14 LAB — POCT INR: INR: 2.7

## 2015-02-18 ENCOUNTER — Inpatient Hospital Stay: Payer: Self-pay | Admitting: Internal Medicine

## 2015-02-18 DIAGNOSIS — I5021 Acute systolic (congestive) heart failure: Secondary | ICD-10-CM | POA: Diagnosis not present

## 2015-02-18 DIAGNOSIS — R0689 Other abnormalities of breathing: Secondary | ICD-10-CM | POA: Diagnosis not present

## 2015-02-18 DIAGNOSIS — I248 Other forms of acute ischemic heart disease: Secondary | ICD-10-CM | POA: Diagnosis not present

## 2015-02-18 DIAGNOSIS — N400 Enlarged prostate without lower urinary tract symptoms: Secondary | ICD-10-CM | POA: Diagnosis present

## 2015-02-18 DIAGNOSIS — Z951 Presence of aortocoronary bypass graft: Secondary | ICD-10-CM | POA: Diagnosis not present

## 2015-02-18 DIAGNOSIS — J209 Acute bronchitis, unspecified: Secondary | ICD-10-CM | POA: Diagnosis present

## 2015-02-18 DIAGNOSIS — J9621 Acute and chronic respiratory failure with hypoxia: Secondary | ICD-10-CM | POA: Diagnosis not present

## 2015-02-18 DIAGNOSIS — I4891 Unspecified atrial fibrillation: Secondary | ICD-10-CM | POA: Diagnosis not present

## 2015-02-18 DIAGNOSIS — I5022 Chronic systolic (congestive) heart failure: Secondary | ICD-10-CM | POA: Diagnosis not present

## 2015-02-18 DIAGNOSIS — Z9981 Dependence on supplemental oxygen: Secondary | ICD-10-CM | POA: Diagnosis not present

## 2015-02-18 DIAGNOSIS — Z7901 Long term (current) use of anticoagulants: Secondary | ICD-10-CM | POA: Diagnosis not present

## 2015-02-18 DIAGNOSIS — I509 Heart failure, unspecified: Secondary | ICD-10-CM | POA: Diagnosis not present

## 2015-02-18 DIAGNOSIS — J9601 Acute respiratory failure with hypoxia: Secondary | ICD-10-CM | POA: Diagnosis not present

## 2015-02-18 DIAGNOSIS — R062 Wheezing: Secondary | ICD-10-CM | POA: Diagnosis not present

## 2015-02-18 DIAGNOSIS — R7989 Other specified abnormal findings of blood chemistry: Secondary | ICD-10-CM | POA: Diagnosis not present

## 2015-02-18 DIAGNOSIS — E872 Acidosis: Secondary | ICD-10-CM | POA: Diagnosis not present

## 2015-02-18 DIAGNOSIS — Z8249 Family history of ischemic heart disease and other diseases of the circulatory system: Secondary | ICD-10-CM | POA: Diagnosis not present

## 2015-02-18 DIAGNOSIS — Z952 Presence of prosthetic heart valve: Secondary | ICD-10-CM | POA: Diagnosis not present

## 2015-02-18 DIAGNOSIS — I2510 Atherosclerotic heart disease of native coronary artery without angina pectoris: Secondary | ICD-10-CM | POA: Diagnosis present

## 2015-02-18 DIAGNOSIS — Z833 Family history of diabetes mellitus: Secondary | ICD-10-CM | POA: Diagnosis not present

## 2015-02-18 DIAGNOSIS — I482 Chronic atrial fibrillation: Secondary | ICD-10-CM | POA: Diagnosis not present

## 2015-02-18 DIAGNOSIS — E119 Type 2 diabetes mellitus without complications: Secondary | ICD-10-CM | POA: Diagnosis present

## 2015-02-18 DIAGNOSIS — R0902 Hypoxemia: Secondary | ICD-10-CM | POA: Diagnosis not present

## 2015-02-18 DIAGNOSIS — R05 Cough: Secondary | ICD-10-CM | POA: Diagnosis not present

## 2015-02-18 DIAGNOSIS — J440 Chronic obstructive pulmonary disease with acute lower respiratory infection: Secondary | ICD-10-CM | POA: Diagnosis not present

## 2015-02-18 DIAGNOSIS — J811 Chronic pulmonary edema: Secondary | ICD-10-CM | POA: Diagnosis not present

## 2015-02-18 DIAGNOSIS — J449 Chronic obstructive pulmonary disease, unspecified: Secondary | ICD-10-CM | POA: Diagnosis not present

## 2015-02-18 DIAGNOSIS — J9620 Acute and chronic respiratory failure, unspecified whether with hypoxia or hypercapnia: Secondary | ICD-10-CM | POA: Diagnosis not present

## 2015-02-18 DIAGNOSIS — I10 Essential (primary) hypertension: Secondary | ICD-10-CM | POA: Diagnosis present

## 2015-02-18 DIAGNOSIS — J189 Pneumonia, unspecified organism: Secondary | ICD-10-CM | POA: Diagnosis not present

## 2015-02-18 DIAGNOSIS — E785 Hyperlipidemia, unspecified: Secondary | ICD-10-CM | POA: Diagnosis present

## 2015-02-18 DIAGNOSIS — J441 Chronic obstructive pulmonary disease with (acute) exacerbation: Secondary | ICD-10-CM | POA: Diagnosis present

## 2015-02-18 DIAGNOSIS — I5023 Acute on chronic systolic (congestive) heart failure: Secondary | ICD-10-CM | POA: Diagnosis not present

## 2015-02-18 DIAGNOSIS — Z882 Allergy status to sulfonamides status: Secondary | ICD-10-CM | POA: Diagnosis not present

## 2015-02-19 DIAGNOSIS — I482 Chronic atrial fibrillation: Secondary | ICD-10-CM

## 2015-02-19 DIAGNOSIS — R7989 Other specified abnormal findings of blood chemistry: Secondary | ICD-10-CM

## 2015-02-19 DIAGNOSIS — I5022 Chronic systolic (congestive) heart failure: Secondary | ICD-10-CM

## 2015-02-19 DIAGNOSIS — J9620 Acute and chronic respiratory failure, unspecified whether with hypoxia or hypercapnia: Secondary | ICD-10-CM

## 2015-02-20 ENCOUNTER — Other Ambulatory Visit: Payer: Self-pay | Admitting: Cardiovascular Disease

## 2015-02-20 DIAGNOSIS — I482 Chronic atrial fibrillation: Secondary | ICD-10-CM

## 2015-02-20 DIAGNOSIS — I5022 Chronic systolic (congestive) heart failure: Secondary | ICD-10-CM

## 2015-02-21 ENCOUNTER — Ambulatory Visit (INDEPENDENT_AMBULATORY_CARE_PROVIDER_SITE_OTHER): Payer: Medicare Other | Admitting: *Deleted

## 2015-02-21 DIAGNOSIS — I059 Rheumatic mitral valve disease, unspecified: Secondary | ICD-10-CM

## 2015-02-21 DIAGNOSIS — Z7901 Long term (current) use of anticoagulants: Secondary | ICD-10-CM | POA: Diagnosis not present

## 2015-02-21 DIAGNOSIS — Z9889 Other specified postprocedural states: Secondary | ICD-10-CM

## 2015-02-21 DIAGNOSIS — Z5181 Encounter for therapeutic drug level monitoring: Secondary | ICD-10-CM

## 2015-02-21 DIAGNOSIS — I4891 Unspecified atrial fibrillation: Secondary | ICD-10-CM

## 2015-02-21 LAB — POCT INR: INR: 2.4

## 2015-02-21 NOTE — Telephone Encounter (Signed)
Please review for refill, Thank you. 

## 2015-02-28 ENCOUNTER — Other Ambulatory Visit: Payer: Self-pay | Admitting: Cardiovascular Disease

## 2015-03-01 ENCOUNTER — Encounter: Payer: Self-pay | Admitting: Cardiovascular Disease

## 2015-03-01 ENCOUNTER — Ambulatory Visit (INDEPENDENT_AMBULATORY_CARE_PROVIDER_SITE_OTHER): Payer: Medicare Other | Admitting: Cardiovascular Disease

## 2015-03-01 VITALS — BP 138/71 | HR 69 | Ht 67.0 in | Wt 162.0 lb

## 2015-03-01 DIAGNOSIS — Z7901 Long term (current) use of anticoagulants: Secondary | ICD-10-CM | POA: Diagnosis not present

## 2015-03-01 DIAGNOSIS — I059 Rheumatic mitral valve disease, unspecified: Secondary | ICD-10-CM

## 2015-03-01 DIAGNOSIS — I25810 Atherosclerosis of coronary artery bypass graft(s) without angina pectoris: Secondary | ICD-10-CM

## 2015-03-01 DIAGNOSIS — I4891 Unspecified atrial fibrillation: Secondary | ICD-10-CM

## 2015-03-01 DIAGNOSIS — Z9889 Other specified postprocedural states: Secondary | ICD-10-CM | POA: Diagnosis not present

## 2015-03-01 DIAGNOSIS — J432 Centrilobular emphysema: Secondary | ICD-10-CM

## 2015-03-01 DIAGNOSIS — I5022 Chronic systolic (congestive) heart failure: Secondary | ICD-10-CM | POA: Diagnosis not present

## 2015-03-01 DIAGNOSIS — Z5181 Encounter for therapeutic drug level monitoring: Secondary | ICD-10-CM

## 2015-03-01 DIAGNOSIS — E785 Hyperlipidemia, unspecified: Secondary | ICD-10-CM

## 2015-03-01 DIAGNOSIS — I10 Essential (primary) hypertension: Secondary | ICD-10-CM | POA: Diagnosis not present

## 2015-03-01 DIAGNOSIS — E118 Type 2 diabetes mellitus with unspecified complications: Secondary | ICD-10-CM

## 2015-03-01 LAB — POCT INR: INR: 1.5

## 2015-03-01 NOTE — Assessment & Plan Note (Signed)
Mechanical mitral valve. On anticoagulation

## 2015-03-01 NOTE — Assessment & Plan Note (Signed)
Ejection fraction 20-30%. Appears relatively euvolemic on today's visit No new medication changes made

## 2015-03-01 NOTE — Assessment & Plan Note (Signed)
We have encouraged continued exercise, careful diet management in an effort to lose weight. 

## 2015-03-01 NOTE — Assessment & Plan Note (Signed)
Currently with no symptoms of angina. No further workup at this time. Continue current medication regimen. 

## 2015-03-01 NOTE — Assessment & Plan Note (Signed)
Recent bronchitis, mild COPD exacerbation. Doing well after antibiotics

## 2015-03-01 NOTE — Patient Instructions (Signed)
You are doing well. No medication changes were made.  Please call us if you have new issues that need to be addressed before your next appt.  Your physician wants you to follow-up in: 6 months.  You will receive a reminder letter in the mail two months in advance. If you don't receive a letter, please call our office to schedule the follow-up appointment.   

## 2015-03-01 NOTE — Assessment & Plan Note (Signed)
Cholesterol is at goal on the current lipid regimen. No changes to the medications were made.  

## 2015-03-01 NOTE — Progress Notes (Signed)
Patient ID: [name], male    DOB: [birth year], 74 y.o.   MRN: [id number]  HPI Comments: [name] is a very pleasant 74 year old gentleman with a past medical history of coronary artery disease, bypass surgery with a left internal mammary to the LAD and saphenous vein graft to the OM in 1989, PCI of the RCA in 2002, COPD, mitral valve replacement with prosthetic valve on Coumadin in 2007, history of chronic atrial fibrillation, diabetes, hypertension, hyperlipidemia, PNA in 03/2010, who presents for routine followup of his prosthetic mitral valve and CAD. History of colon surgery with ostomy for diverticuli  Recent hospitalization for bronchitis requiring IV antibiotics. He was seen by myself 02/19/2015. No suggestion of heart failure. Symptoms improved without prednisone.  Discharged home and has continued to improve since then. Currently feels close to his baseline  Most recent INR 1.5. He reports that he ate significant "greens" when he got home. He was concerned antibiotics were pushes INR high. EKG on today's visit shows atrial fibrillation with right bundle branch block   Other past medical history hospital in September 04 2011 with atrial fibrillation with RVR, chest pain, bronchitis, COPD exacerbation. He was treated with steroids, antibiotics, rate control for his atrial fibrillation with improvement of his symptoms.    hospital January 06 2012 with shortness of breath and congestion felt to have COPD exacerbation,  nebulizers, steroids. He suffered a perforated bowel with air under his diaphragm and was transferred to [facility] and within 48 hours, had surgery performed. He presents today with an ostomy. 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 He had a 24 pound weight loss through this procedure, open abdominal wall wound healing by secondary intention.  He was discharged with underlying  anemia, hematocrit 27 INR of 4  Repeat echocardiogram for shortness of breath  starting in October showed a decline in his ejection fraction to 20-30%, worsening inferior, posterior and lateral wall hypokinesis (inferior and posterior wall are almost akinetic). Prior ejection fraction 40-45%.   stress test late 2013 showing large region of scar in the inferior and inferolateral wall consistent with prior MI, no ischemia        Outpatient Encounter Prescriptions as of 03/01/2015  Medication Sig  . carvedilol (COREG) 12.5 MG tablet Take 1 tablet (12.5 mg  total) by mouth 2 (two)  times daily with a meal.  . Cholecalciferol (VITAMIN D3) 1000 UNITS CAPS Take 1 capsule by mouth daily.    Marland Kitchen dextromethorphan-guaiFENesin (MUCINEX DM) 30-600 MG per 12 hr tablet Take 1 tablet by mouth every 12 (twelve) hours.  Marland Kitchen DIGOX 125 MCG tablet Take 1 tablet by mouth  daily  . furosemide (LASIX) 20 MG tablet Take 1 tablet by mouth  daily  . latanoprost (XALATAN) 0.005 % ophthalmic solution Place 1 drop into both eyes at bedtime.   Marland Kitchen losartan (COZAAR) 100 MG tablet Take one-half tablet by  mouth daily  . metFORMIN (GLUCOPHAGE) 500 MG tablet Take 500 mg by mouth 2 (two) times daily with a meal.   . Multiple Vitamins-Minerals (PRESERVISION/LUTEIN PO) Apply to eye at bedtime.  . simvastatin (ZOCOR) 40 MG tablet Take 40 mg by mouth at bedtime.   . Tamsulosin HCl (FLOMAX) 0.4 MG CAPS Take 0.4 mg by mouth daily.   . Tiotropium Bromide Monohydrate (SPIRIVA RESPIMAT) 2.5 MCG/ACT AERS Inhale 2 puffs into the lungs daily.  Marland Kitchen warfarin (COUMADIN) 2.5 MG tablet Take as  directed by coumadin clinic   Social history History   Social History  . Marital Status: Married    Spouse Name: N/A  . Number of Children: 3  . Years of Education: N/A   Occupational History  . retired [employer] pilot    Social History Main Topics  . Smoking status: Former Smoker -- 4.00 packs/day for 23 years    Types: Cigarettes    Quit date:  12/23/1987  . Smokeless tobacco: Never Used  . Alcohol Use: No  . Drug Use: No  . Sexual Activity: Not on file   Other Topics Concern  . Not on file   Social History Narrative   Lives in [place], [place] with wife.      Review of Systems  Constitutional: Negative.   HENT: Negative.   Cardiovascular: Negative.   Gastrointestinal: Negative.   Musculoskeletal: Negative.   Neurological: Negative.   Psychiatric/Behavioral: Negative.   All other systems reviewed and are negative.  BP 138/71 mmHg  Pulse 69  Ht 5\' 7"  (1.702 m)  Wt 162 lb (73.483 kg)  BMI 25.37 kg/m2  Physical Exam  Constitutional: He is oriented to person, place, and time. He appears well-developed and well-nourished.  HENT:  Head: Normocephalic.  Nose: Nose normal.  Mouth/Throat: Oropharynx is clear and moist.  Eyes: Conjunctivae are normal. Pupils are equal, round, and reactive to light.  Neck: Normal range of motion. Neck supple. No JVD present.  Cardiovascular: Normal rate, S1 normal, S2 normal and intact distal pulses.  An irregularly irregular rhythm present. Exam reveals no gallop and no friction rub.   No murmur heard. Pulmonary/Chest: Effort normal. No respiratory distress. He has no wheezes. He has no rales. He exhibits no tenderness.  Abdominal: Soft. Bowel sounds are normal. He exhibits no distension. There is no tenderness.  Musculoskeletal: Normal range of motion. He exhibits no edema or tenderness.  Lymphadenopathy:    He has no cervical adenopathy.  Neurological: He is alert and oriented to person, place, and time. Coordination normal.  Skin: Skin is warm and dry. No rash noted. No erythema.  Psychiatric: He has a normal mood and affect. His behavior is normal. Judgment and thought content normal.      Assessment and Plan   Nursing note and vitals reviewed.

## 2015-03-01 NOTE — Assessment & Plan Note (Signed)
Chronic atrial fibrillation, tolerating anticoagulation. INR subtherapeutic today. Adjusted by Coumadin clinic Rate well controlled

## 2015-03-06 DIAGNOSIS — I509 Heart failure, unspecified: Secondary | ICD-10-CM | POA: Diagnosis not present

## 2015-03-06 DIAGNOSIS — J449 Chronic obstructive pulmonary disease, unspecified: Secondary | ICD-10-CM | POA: Diagnosis not present

## 2015-03-06 DIAGNOSIS — Z125 Encounter for screening for malignant neoplasm of prostate: Secondary | ICD-10-CM | POA: Diagnosis not present

## 2015-03-07 ENCOUNTER — Ambulatory Visit (INDEPENDENT_AMBULATORY_CARE_PROVIDER_SITE_OTHER): Payer: Medicare Other | Admitting: Pharmacist

## 2015-03-07 DIAGNOSIS — Z7901 Long term (current) use of anticoagulants: Secondary | ICD-10-CM | POA: Diagnosis not present

## 2015-03-07 DIAGNOSIS — Z9889 Other specified postprocedural states: Secondary | ICD-10-CM

## 2015-03-07 DIAGNOSIS — I4891 Unspecified atrial fibrillation: Secondary | ICD-10-CM | POA: Diagnosis not present

## 2015-03-07 DIAGNOSIS — I059 Rheumatic mitral valve disease, unspecified: Secondary | ICD-10-CM | POA: Diagnosis not present

## 2015-03-07 DIAGNOSIS — Z5181 Encounter for therapeutic drug level monitoring: Secondary | ICD-10-CM

## 2015-03-07 LAB — POCT INR: INR: 3.2

## 2015-03-21 ENCOUNTER — Ambulatory Visit (INDEPENDENT_AMBULATORY_CARE_PROVIDER_SITE_OTHER): Payer: Medicare Other

## 2015-03-21 DIAGNOSIS — Z5181 Encounter for therapeutic drug level monitoring: Secondary | ICD-10-CM | POA: Diagnosis not present

## 2015-03-21 DIAGNOSIS — H3532 Exudative age-related macular degeneration: Secondary | ICD-10-CM | POA: Diagnosis not present

## 2015-03-21 DIAGNOSIS — Z9889 Other specified postprocedural states: Secondary | ICD-10-CM

## 2015-03-21 DIAGNOSIS — I059 Rheumatic mitral valve disease, unspecified: Secondary | ICD-10-CM | POA: Diagnosis not present

## 2015-03-21 DIAGNOSIS — I4891 Unspecified atrial fibrillation: Secondary | ICD-10-CM

## 2015-03-21 DIAGNOSIS — Z7901 Long term (current) use of anticoagulants: Secondary | ICD-10-CM | POA: Diagnosis not present

## 2015-03-21 LAB — POCT INR: INR: 3.5

## 2015-04-15 NOTE — Discharge Summary (Signed)
PATIENT NAME:  [name], [name] MR#:  [id number] DATE OF BIRTH:  29-Jan-1941  DATE OF ADMISSION:  01/05/2012 DATE OF DISCHARGE:  01/11/2012  ADMITTING DIAGNOSIS: Shortness of breath, chest pain.   TRANSFER DIAGNOSES:  1. Acute respiratory failure felt to be due to decompensated congestive heart failure as well as rapid atrial fibrillation, now improved.  2. Abdominal pain felt to be due to diverticulitis with perforation, was treated with intravenous antibiotics. Patient's symptoms continued to persist. Family wanted transfer to another facility.  3. Possible pneumonia/bronchitis.  4. Acute on chronic obstructive pulmonary disease exacerbation. Patient treated with nebulizers and steroids.  5. Atrial fibrillation with rapid ventricular response, was on Coumadin but held due to possible need for surgery with diverticulitis with perforation.  6. Acute on chronic renal failure which is also improved compared to admission.  7. Intermittent urinary retention due to benign prostatic hypertrophy requiring intermittent in and out catheterization.  8. Status post mitral valve replacement with prosthetic valve, as stated Coumadin on hold.  9. Diabetes.  10. Hyperlipidemia.  11. Benign prostatic hypertrophy.   CONSULTANTS DURING HOSPITALIZATION:  1. Dr. Rockey Situ of cardiology.  2. Dr. Pat [name] of surgery.   LABORATORY, DIAGNOSTIC AND RADIOLOGICAL DATA: Admitting glucose 177, BUN 16, creatinine 1.18, sodium 142, potassium 4.2, chloride 104, CO2 26, calcium 9.2. LFTs were normal. Troponin was initially 0.04 then 0.06 then less than 0.02. WBC 8.2, hemoglobin 13.2, platelet count 149. Most recent CBC on 01/20: WBC 6.6, hemoglobin 11.4, platelet count 135. Most recent BMP on 01/20: Glucose 165, BUN 29, creatinine 1.02, sodium 139, potassium 3.5, chloride 101, CO2 29. His creatinine got as high as 1.34 on 01/15. Sputum showed rare gram-positive cocci, few gram-negative rods. CT of the abdomen and pelvis done on 01/18  showed normal peritoneum consistent with perforated hollow viscus, likely sigmoid colon diverticulitis. Chest x-ray on admission showed pulmonary vascular congestion, mild interstitial edema in right lung base, probable minimal atelectasis or infiltrate.   HOSPITAL COURSE: Please see history and physical done by the admitting physician. Patient is a 74 year old white male who presented to the ED with acute onset of shortness of breath began earlier in the evening on day of admission. Patient was noted in ED to have atrial fibrillation with RVR. He initially had to be placed on BiPAP. His chest x-ray revealed that he likely had congestive heart failure. Patient was admitted for acute respiratory failure as a result of congestive heart failure, acute chronic obstructive pulmonary disease exacerbation as well as possible pneumonia. Patient was started on Cardizem drip and given IV Lasix. With these treatments, his respiratory status improved. He was also seen by cardiology who concurred with the current management. Patient's symptoms were improving from his cardiac, pulmonary standpoint. On 01/18 patient started complaining of sudden onset of acute abdominal pain therefore abdominal x-ray was done which showed air under the diaphragm therefore a STAT CT scan was done which showed perforated diverticulum. Patient was seen in consultation by surgery in light of all of his cardiopulmonary processes. They recommended treating him with IV antibiotics and monitoring him. Patient's family was concerned about no surgical treatment, however, it was felt that he was very high risk in light of his recent admission for acute respiratory failure, atrial fibrillation with RVR. He was being treated conservatively. He did not appear toxic. He has continued to have abdominal pain. Patient had been given option from day one since his GI issue occurred by surgery for transfer. On 01/20 they requested transfer  to another facility. Dr.  Pat [name], the surgeon, made transfer to [facility]. Surgical service did not accept the patient as a primary admitting service so arrangements were made to the IM service. Patient at that time of discharge was in stable condition.   DISCHARGE MEDICATIONS:  1. Tylenol 650 mg every four hours p.r.n.  2. Symbicort 80/45, 2 puffs b.i.d.  3. Carvedilol 12.5 b.i.d.  4. Digoxin 0.125 mg daily.  5. Sliding scale insulin.  6. Latanoprost 0.005% ophthalmic drop both eyes at bedtime. 7. Multivitamin daily.  8. Zofran 4 mg q.4 p.r.n.  9. Flomax 0.4 daily.  10. Solu-Medrol 40 mg IV q.8.  11. Azithromycin 250 mg daily.  12. Cipro 400 IV every 12 hours.  13. Flagyl 500 IV q.8. 14. Morphine 2 to 4 mg IV q.2 p.r.n. pain.  15. Protonix 40 mg IV q.24.  16. Prednisone taper at 40 mg daily.   TIME SPENT: 40 minutes spent on the transfer summary.  ____________________________ Lafonda Mosses Posey Pronto, MD shp:cms D: 01/12/2012 13:37:39 ET T: 01/13/2012 12:48:17 ET JOB#: [id number]  cc: Royal Beirne H. Posey Pronto, MD, <Dictator> Alric Seton MD ELECTRONICALLY SIGNED 01/28/2012 8:16

## 2015-04-15 NOTE — Consult Note (Signed)
PATIENT NAME:  [name], [name] MR#:  [id number] DATE OF BIRTH:  1941/01/25  DATE OF CONSULTATION:  01/09/2012  REFERRING PHYSICIAN:   CONSULTING PHYSICIAN:  Micheline Maze, MD PRIMARY CARE PHYSICIAN:  Debbora Dus, MD, at [facility]  CARDIOLOGIST: Dr Rockey Situ  CHIEF COMPLAINT: Abdominal pain.   BRIEF HISTORY: [name] is a 74 year old gentleman with multiple medical problems admitted to the hospital on 01/05/2012 with shortness of breath, significant cough. He felt to have a severe upper respiratory infection, and his treatment was unresponsive to therapy at home. He had progressive dyspnea and he was felt to have a possible pneumonia. He was also noted to be in a rapid atrial fibrillation with heart rate in the 150s to 160s, which exacerbated his shortness of breath. He was placed on BiPAP and IV Cardizem, admitted to the hospital and conversion to p.o. medications undertaken.   He has a long history of significant coronary artery disease and valvular heart disease, having undergone coronary artery bypass, stent placement and a mitral valve replacement. He is on chronic Coumadin therapy. He also has a longstanding history of chronic lung disease and has had multiple episodes of upper respiratory infection. He has had a colonoscopy several years ago which noted a couple of polyps and some diverticular disease. He has no other significant abdominal symptoms. He does have a history of prostate cancer and has been treated with radiation therapy. His history also includes chronic atrial fibrillation, diabetes, hyperlipidemia,  and long-standing hypertension. He is a reformed cigarette smoker. He does not drink any significant amounts of alcohol. He is retired.   ALLERGIES: He is allergic to codeine and sulfa drugs.   MEDICATIONS: Well outlined in his current note. It is important to note that he does have a history of Coumadin usage for his bivalvular heart disease.   REVIEW OF SYSTEMS: Reveals  no visual or hearing disturbances. He has no swallowing complaints.  His respiratory symptoms were noted above. His cardiac symptoms were noted above. He does not have significant GI complaints, although he has had some intermittent episodes of the left lower quadrant pain. He has not had any previous history of significant GI distress, but he does have mild constipation. He has some significant urinary symptoms as he has had previous radiation therapy with seed implant and has developed some urethral stricture. He catheterizes himself on a daily basis to prevent increase in the stricture. He has no psychiatric or neurologic complaints.   PHYSICAL EXAMINATION:  GENERAL: He is an alert and pleasant gentleman in no significant distress.   VITALS: His vital signs are stable. Blood pressure 148/74, his heart rate 82 and irregular.  His oxygen saturation is satisfactory.   HEENT: No facial deformities, normal pupils with no scleral icterus.   NECK: Supple without adenopathy. His trachea is midline.   CHEST: Clear although he has very distant breath sounds. He has no adventitious sounds noted.   HEART: Reveals irregular rate and irregular rhythm with no murmurs noted to my ear. He has very distant heart sounds. He has normal PMI.   ABDOMEN: His abdomen shows left lower quadrant tenderness with some suprapubic tenderness. Mild rebound, some guarding of the left side with hyperactive bowel sounds.   EXTREMITIES: Lower extremity exam reveals no edema, full range of motion, and good distal pulses.   PSYCHIATRIC: No orientation or affect problems. His mood appears to be normal.   The surgical service was consulted because on followup chest x-ray the  morning of January 09, 2012, he was noted to have some free air under the diaphragm. He had had diarrhea 4 days ago and then 2 days ago developed sudden onset of left lower quadrant discomfort which has now improved. CT scan was performed which demonstrated  what appeared to be a microperforation with possible abscess, possible sigmoid diverticulitis with pericolonic fluid collection and stranding suggestive of possible early abscess formation. The surgical service was consulted. I agree with the diagnosis of perforation likely related to diverticular disease.   RECOMMENDATION: He is a very poor surgical candidate with his multiple comorbidities, and he is not having any instability or evidence of systemic inflammatory response at the present time. His white blood cell count is normal, and he is afebrile. Abdominal exam is impressive but he is improving on antibiotic therapy and pain medication. We will hold his Coumadin and bridge him to Lovenox or heparin if necessary but currently his pro time is greater than an INR of 3. We will continue to follow him over the next day or two. If his symptoms did not improve then we will discuss surgical intervention. I have talked with the family about the risks of surgery including the need for possible prolonged ventilator support and temporary colostomy. The patient is aware of these concerns. At the present time they are in agreement.     ____________________________ Micheline Maze, MD rle:vtd D: 01/10/2012 12:10:00 ET T: 01/10/2012 12:39:50 ET JOB#: [id number]  cc: Rodena Goldmann III, MD, <Dictator> Rodena Goldmann MD ELECTRONICALLY SIGNED 01/14/2012 7:56

## 2015-04-15 NOTE — H&P (Signed)
PATIENT NAME:  [name], [name] MR#:  [id number] DATE OF BIRTH:  [birth year]  DATE OF ADMISSION:  01/05/2012  PRIMARY CARE PHYSICIAN: Dr. Jacqualine Code  CARDIOLOGIST: Dr. Ida Rogue  CHIEF COMPLAINT: Shortness of breath and chest pain.   HISTORY OF PRESENT ILLNESS: This is a 74 year old male who was brought into the hospital due to acute onset of shortness of breath that began earlier this evening. Patient says that he has not been feeling well for the past few days requiring increasing amounts of nebulizer treatments at home. He has progressive exertional dyspnea. He denied any paroxysmal nocturnal dyspnea or any lower extremity edema or any weight gain. This afternoon he felt acutely short of breath and noted to be in rapid atrial fibrillation with heart rates in the 150s to 160s. He was urgently brought to the ER, placed on BiPAP and given IV Cardizem for rate control. After receiving his first dose of Cardizem he dramatically felt better. Patient currently still remains uncontrolled with his atrial fibrillation but after being on BiPAP and receiving some Lasix his shortness of breath has improved. He did have some mild chest pain when he first came here which is now resolved. He did have some nausea and one episode of vomiting consistent with the food he ate later this evening. He denied any cough, congestion, any upper respiratory symptoms, any abdominal pain, any headache, dizziness, any loss of consciousness, or any other associated symptoms. Hospitalist service was then contacted for further treatment and evaluation.   REVIEW OF SYSTEMS: CONSTITUTIONAL: Positive low-grade fever 100.7. No weight gain. No weight loss. EYES: No blurry or double vision. ENT: No tinnitus. No postnasal drip. No redness of the oropharynx. RESPIRATORY: No cough. Positive wheeze. No hemoptysis. Positive dyspnea. CARDIOVASCULAR: Positive chest pain. No orthopnea. Positive palpitations. No syncope. GASTROINTESTINAL: Positive nausea,  one episode of vomiting. No diarrhea, no abdominal pain, no melena, no hematochezia. GENITOURINARY: No dysuria. No hematuria. ENDOCRINE: No polyuria or nocturia. No heat or cold intolerance. HEME: No anemia. No bruising. No bleeding. INTEGUMENTARY: No rashes, no lesions. MUSCULOSKELETAL: No arthritis, no swelling, no gout. NEUROLOGIC: No numbness, no tingling, no ataxia, no seizure-type activity. PSYCH: No anxiety, no insomnia, no ADD.    PAST MEDICAL HISTORY:  1. History of coronary artery disease, status post bypass. 2. 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 3. Chronic obstructive pulmonary disease. 4. History of chronic atrial fibrillation. 5. Diabetes. 6. Hypertension. 7. Hyperlipidemia.  8. Benign prostatic hypertrophy.   ALLERGIES: Codeine, sulfa drugs.   SOCIAL HISTORY: Used to be a smoker, quit many years ago. No alcohol abuse. No illicit drug abuse. Lives at home with his wife.   FAMILY HISTORY: Patient's family history is positive for diabetes. Brother died from MI at age 76, sister died age 63 from a myocardial infarction and mother had myocardial infarction died age 108. Father had three myocardial infarctions and a cerebrovascular accident, died at age 64.   CURRENT MEDICATIONS:  1. Coreg 12.5 mg b.i.d.  2. Digoxin 0.125 mcg daily.  3. DuoNebs q.i.d. if needed.  4. Lasix 20 mg daily.  5. Latanoprost ophthalmic eyedrops 1 drop to affected eye at bedtime. 6. Losartan 100 mg daily.  7. Metformin 500 mg b.i.d.  8. Mucinex 600 mg b.i.d.  9. Norco 5/325, 1 to 2 tabs every 4 to 6 hours as needed.  10. PreserVision 1 tab daily.  11. Simvastatin 40 mg daily.  12. Symbicort 80/4.5, 1 puff b.i.d.  13. Flomax 0.4 mg at  bedtime.  14. Warfarin 2.5 mg on Monday, Tuesday, Thursday, Friday and Sunday and 5 mg on Wednesdays and Saturday.     PHYSICAL EXAMINATION:  VITAL SIGNS: Temperature febrile at 100.7, pulse 110s to 120s irregular, respirations 26, blood  pressure 133/67, sats 97% on BiPAP.   GENERAL: He is a pleasant appearing male in mild respiratory distress.   HEENT: Atraumatic, normocephalic. His extraocular muscles are intact. Pupils equal, reactive to light. Sclerae anicteric. No conjunctival injection. No oropharyngeal erythema.   NECK: Supple. There is no jugular venous distention. No bruits. No lymphadenopathy. No thyromegaly.   HEART: Irregular, tachycardic. No murmurs, no rubs, no clicks.   LUNGS: He has some diffuse crackles at the bases. No wheezing. Negative use of accessory muscles. No dullness to percussion.   ABDOMEN: Soft, flat, nontender, nondistended. Has good bowel sounds. No hepatosplenomegaly appreciated.   EXTREMITIES: No evidence of any cyanosis, clubbing. He does have trace pedal edema from the knees to ankles bilaterally.   SKIN: Moist, warm with no rashes.   LYMPHATIC: There is no cervical or axillary lymphadenopathy.   NEUROLOGIC: He is alert, awake, oriented x3 with no focal motor or sensory deficits appreciated bilaterally.   LABORATORY, DIAGNOSTIC, AND RADIOLOGICAL DATA: Serum glucose 177, BUN 16, creatinine 1.1, BNP 3400, sodium 142, potassium 4.2, chloride 104, bicarbonate 26. LFTs are within normal limits. CK 90, CK-MB 2.2. Troponin 0.04. Digoxin level 0.6. White cell count 8.2, hemoglobin 13.3, hematocrit 39.8, platelet count 149, INR 2.5.   Patient did have a chest x-ray done which showed pulmonary vascular congestion with mild interstitial edema in right lung base, probable minimal atelectasis.   ASSESSMENT AND PLAN: This is a 74 year old male with history of chronic obstructive pulmonary disease, hypertension, coronary disease status post coronary artery bypass graft, history of chronic atrial fibrillation, history of mechanical prosthetic valve mitral valve, diabetes, hyperlipidemia presents to the hospital with shortness of breath, chest pain, noted to be in rapid atrial fibrillation.  1. Acute  respiratory failure, likely secondary to decompensated congestive heart failure from being in rapid atrial fibrillation. He has clinically improved with BiPAP and also IV diuretics. I will place him on IV Lasix b.i.d., control his rate for his atrial fibrillation and follow him clinically. I will try to attempt him off BiPAP and follow his serial blood gases and follow him clinically.  2. Decompensated congestive heart failure, likely due to rapid atrial fibrillation. I will place him on IV Lasix b.i.d. Follow strict ins and outs and daily weights. Continue his beta blockers and ACE inhibitors. Will get a cardiology consult. Patient is well known to Dr. Rockey Situ.  3. Rapid atrial fibrillation. Currently rate uncontrolled. He has a history of chronic atrial fibrillation. His INR is therapeutic at 2.5. I will continue his Coumadin. Will place him on a Cardizem drip for rate control. Continue his Coreg and his digoxin and get a cardiology consult as mentioned.  4. Chest pain. I think his chest pain is likely related to the fact that he was in rapid atrial fibrillation. I do not appreciate any acute EKG changes consistent with cardiac ischemia. His chest pain is currently resolved. Will continue him on telemetry. Check serial cardiac markers.  5. Chronic obstructive pulmonary disease. I do not think patient has an acute chronic obstructive pulmonary disease exacerbation. He received one dose of Decadron in the Emergency Room and some DuoNebs. Clinically he is improved. I do not hear any wheezing. I will continue some Symbicort and p.r.n. nebulizer treatments  for now.  6. Hyperlipidemia. Continue simvastatin.  7. Benign prostatic hypertrophy. Continue Flomax.  8. Diabetes. Hold his metformin, place him on sliding scale insulin for now.  9. CODE STATUS: Patient is a FULL CODE.   TIME SPENT WITH THE ADMISSION: 50 minutes.   ____________________________ Belia Heman. Verdell Carmine, MD vjs:cms D: 01/05/2012 22:06:25  ET T: 01/06/2012 07:26:29 ET JOB#: [id number] cc: Milinda Pointer. Jacqualine Code, MD Henreitta Leber MD ELECTRONICALLY SIGNED 01/09/2012 16:34

## 2015-04-15 NOTE — Discharge Summary (Signed)
PATIENT NAME:  [name], [name] MR#:  [id number] DATE OF BIRTH:  [birth year]  DATE OF ADMISSION:  01/05/2012 DATE OF DISCHARGE:  01/11/2012   ADMITTING PHYSICIAN: Abel Presto, MD   PRIMARY CARE PHYSICIAN: Debbora Dus, MD   CARDIOLOGIST: Ida Rogue, MD   BRIEF HISTORY: Mr. Slate Debroux is a 74 year old gentleman admitted to the medical service on 01/05/2012 with acute onset shortness of breath. He has a longstanding history of chronic obstructive lung disease and had noted some increasing exertional dyspnea. He became acutely short of breath and presented to the Emergency Room with rapid atrial fibrillation at a rate of 150 to 160. He was placed on BiPAP and IV Cardizem and his symptoms improved. Chest x-ray demonstrated possible left lower lobe pneumonia. He was given a single dose of steroids and treated with nebulizers. The medical service admitted him and had Dr. Rockey Situ consult on his progress. He continued to improve over the next several days. He had an episode of significant abdominal pain on January 16th which resolved spontaneously. On January 18th he was preparing to be discharged from his medical point of view when a chest x-ray demonstrated free air under his diaphragm suggestive of possible perforated viscus. CT scan was performed which demonstrated free fluid and stranding consistent with a microperforation of the sigmoid colon. There was some free air in the mesentery and some under the diaphragm. Surgical service was consulted.   I saw him on the evening of the 18th. His abdominal symptoms were significant with marked left lower quadrant pain. However, he did not have significant white blood cell count, was not afebrile, and did not have any evidence of hypotension or sepsis. Because of his multiple medical problems and the fact that he had been on Coumadin, we elected to treat him with antibiotic therapy in an effort to avoid acute surgical intervention. He has continued to be treated for  the last 48 hours and has improved clinically with white blood cell count in the 6000 range and he remains afebrile. His abdominal exam is improved with much less abdominal discomfort and his clinical exam is much improved with a softer abdomen, guarding but no rebound.   His family has been concerned about the choice of therapy in this gentleman pursuing a nonoperative course. His daughter is an Emergency Room nurse and has requested transfer to [facility] in the care of [facility]. I have contacted that service tonight and they have recommended that we transfer him to the medical service and they will consult on his surgical case.   PAST MEDICAL HISTORY: He has multiple medical problems including: 1. Coronary artery disease with a previous cardiac bypass and previous cardiac stenting. He has also had a mitral valve replacement with a prosthetic valve and has had a chronic atrial fibrillation problem. He has been anticoagulated on Coumadin for some years and currently has a pro-time of 27.  2. History of insulin dependent diabetes. 3. Hypertension. 4. Hyperlipidemia. 5. Prostate cancer. 6. Chronic lung disease.   ALLERGIES: He is allergic to codeine and sulfa drugs.   DISCHARGE MEDICATIONS:  1. Coreg 1.25 mg p.o. b.i.d.  2. Digoxin 0.125 mg daily.  3. DuoNebs q.i.d. as needed. 4. Lasix 20 mg p.o. daily. 5. Losartan 100 mg p.o. daily.  6. Metformin 500 mg p.o. b.i.d.  7. Mucinex 600 mg b.i.d.  8. Simvastatin 40 mg p.o. daily.  9. Symbicort 80/4.5 one puff b.i.d.  10. Flomax 0.4 mg at bedtime.  11.  Coumadin 2.5 mg on Monday, Tuesday, Thursday, Saturday and Sunday; 5 mg on Wednesdays and Saturdays.   I have discussed the transfer with Dr. Clementeen Graham of the Internal Medicine service at [facility] and through their [place] service they have accepted him for a telemetry bed.   FINAL DISCHARGE DIAGNOSES:  1. Chronic obstructive lung disease. 2. Rapid atrial  fibrillation.   3. Left lower lobe pneumonia. 4. Perforated sigmoid diverticulitis.   ____________________________ Micheline Maze, MD rle:drc D: 01/11/2012 19:03:14 ET T: 01/12/2012 05:51:09 ET JOB#: [id number]  cc: Micheline Maze, MD, <Dictator> Minna Merritts, MD Milinda Pointer Jacqualine Code, MD Rodena Goldmann MD ELECTRONICALLY SIGNED 01/14/2012 7:57

## 2015-04-15 NOTE — Consult Note (Signed)
General Aspect [name] is a very pleasant 74 year old gentleman with a past medical history of coronary artery disease, bypass surgery, mitral valve replacement with prosthetic valve on Coumadin, COPD, history of chronic atrial fibrillation, diabetes, hypertension, hyperlipidemia, PNA in 03/2010, URI/COPD exacerbation sept 2012, who presents with worsening SOB and congestion for the past week.   He reports not feeling well for the past few days requiring increasing amounts of nebulizer treatments at home. His wife has noticed increased cough and exertional dyspnea. No significant paroxysmal nocturnal dyspnea, lower extremity edema or weight gain.   In the ER, he was in rapid atrial fibrillation with heart rates in the 150s to 160s. He was started on BiPAP and IV Cardizem for rate control. After receiving his first dose of Cardizem he  felt better and heart rate improved. He was also given Lasix. He did have some mild chest pain in the setting of atrial fib with RVR.  He did have some nausea and one episode of vomiting. reports of a low grade fever in the ER.    Present Illness .  Family History: Coronary Artery Disease:   CVA or Stroke:     Social History: Tobacco Use - Former. - stopped 1989 Drug Use - no Retired   Married   Mr. Rebert had his last stress test in 2009, that showed ejection fraction 43%, inferior wall infarct with no ischemia.       echocardiogram done 12/2010 confirms prosthetic mitral valve is working well with no significant regurgitation, ejection fraction 40-45% with inferior hypokinesis.   Physical Exam:   GEN WD, WN, NAD    HEENT red conjunctivae    NECK supple    RESP wheezing  rhonchi    CARD Regular rate and rhythm  Murmur    Murmur Systolic    ABD denies tenderness  soft    LYMPH negative neck    EXTR negative edema    SKIN normal to palpation    NEURO cranial nerves intact    PSYCH alert, A+O to time, place, person, good insight   Review of  Systems:   Subjective/Chief Complaint SOB, worsening cough, malaise    General: Fatigue  Weakness    Skin: No Complaints    ENT: No Complaints    Eyes: No Complaints    Neck: No Complaints    Respiratory: Frequent cough  Short of breath    Cardiovascular: Palpitations  Dyspnea    Gastrointestinal: No Complaints    Genitourinary: No Complaints    Vascular: No Complaints    Musculoskeletal: No Complaints    Neurologic: No Complaints    Hematologic: No Complaints    Endocrine: No Complaints    Psychiatric: No Complaints     hard of hearing:    glaucoma:    cataract removal:    Prostate Cancer: May 2010   COPD:    Myocardial Infarct:    Hypertension:    Hypercholesterolemia:    Atrial Fibrillation:    CHF:    Diabetes Mellitus, Type II (NIDD):    gout:    Hernia Repair:    artificial valve--mechanical:    CABG (Coronary Artery Bypass Graft):    v-fib:        Admit Diagnosis:   AF; ACUTE CONGESTIVE HEART FAILURE: 06-Jan-2012, Active, AF, ACUTE CONGESTIVE HEART FAILURE      Admit Reason:   Acute CHF: (428.0) Active, ICD9, Congestive heart failure, unspecified   AF (atrial fibrillation): (427.31) Active, ICD9, Atrial  fibrillation  Home Medications:  metformin 500 mg oral tablet: 1 tab(s) orally 2 times a day, Active  carvedilol 12.5 mg oral tablet: 1 tab(s) orally 2 times a day, Active  Symbicort 80 mcg-4.5 mcg/inh inhalation aerosol: 1 puff(s) inhaled 1 - 2 times daily as needed, Active  digoxin 125 mcg (0.125 mg) oral tablet: 1 tab(s) orally once a day (in the morning), Active  DuoNeb 0.5 mg-2.5 mg/3 mL inhalation solution: 1 treatment inhaled QID if Needed, Active  latanoprost ophthalmic: 1 drop(s) to each affected eye once a day (at bedtime), Active  losartan 100 mg oral tablet: 1 tab(s) orally once a day (in the morning), Active  simvastatin 40 mg oral tablet: 1 tab(s) orally once a day (at bedtime), Active  tamsulosin 0.4 mg oral  capsule: 1 cap(s) orally once a day (at bedtime), Active  Mucinex 600 mg oral tablet, extended release: 2 tab(s) orally once a day (at bedtime), Active  PreserVision oral capsule: 1 cap(s) orally once a day (in the morning), Active  warfarin 2.5 mg oral tablet: 1 tab(s) orally once a day (at bedtime) on Mon - Tues - Thurs-Friday and Sunday, Active  warfarin 5 mg oral tablet: 1 tab(s) orally once a day (at bedtime) on Wednesday and Saturday, Active  furosemide 20 mg oral tablet: 2 tab(s) orally once a day (in the morning), Active  indomethacin 50 mg oral capsule: 1 cap(s) orally 3 times a day, Active  Lasix 20 mg oral tablet: 1 tab(s) orally once a day, Active    Cardiac:  15-Jan-13 02:52    CK, Total 58   CPK-MB, Serum 1.2  Routine Hem:  15-Jan-13 02:52    WBC (CBC) 10.4   RBC (CBC) 3.87   Hemoglobin (CBC) 11.9   Hematocrit (CBC) 35.4   Platelet Count (CBC) 142   MCV 92   MCH 30.7   MCHC 33.5   RDW 15.9  Routine Chem:  15-Jan-13 02:52    Glucose, Serum 281   BUN 22   Creatinine (comp) 1.34   Sodium, Serum 143   Potassium, Serum 4.2   Chloride, Serum 104   CO2, Serum 24   Calcium (Total), Serum 8.6   Osmolality (calc) 298   eGFR (African American) >60   eGFR (Non-African American) 56   Anion Gap 15  Cardiac:  15-Jan-13 02:52    Troponin I 0.06  Routine Hem:  15-Jan-13 02:52    Neutrophil % 88.1   Lymphocyte % 5.1   Monocyte % 6.8   Eosinophil % 0.0   Basophil % 0.0   Neutrophil # 9.1   Lymphocyte # 0.5   Monocyte # 0.7   Eosinophil # 0.0   Basophil # 0.0  Cardiac:  15-Jan-13 12:46    CK, Total 54   CPK-MB, Serum 1.6   Troponin I < 0.02   EKG:   Interpretation EKG shows atrial fib with rate 103 bpm, nonspecific ST ABN   Radiology Results: XRay:    14-Jan-13 18:45, Chest Portable Single View   Chest Portable Single View    REASON FOR EXAM:    Shortness of Breath  COMMENTS:       PROCEDURE: DXR - DXR PORTABLE CHEST SINGLE VIEW  - Jan 05 2012  6:45PM      RESULT: Comparison is made to the examination of 04 September 2011. The   cardiac silhouette is enlarged. Patchy density is seen at the right lung   base. There is mild interstitial edema and pulmonary  vascular congestion.   No significant effusion is present. Cardiac monitoring electrodes are   present. Wires present from previous surgery of the right hemithorax.   CABG changes are present with sternotomy wires in position.    IMPRESSION:   1. Pulmonary vascular congestion with mild interstitial edema and right   lung base probable minimal atelectasis. Early infiltrate is not excluded.  2. Cardiomegaly, stable.          Verified By: Sundra Aland, M.D., MD    Sulfa drugs: Hives, Rash  Tea: Fever  Codeine: Other  Vital Signs/Nurse's Notes: **Vital Signs.:   15-Jan-13 07:00   Vital Signs Type Routine   Temperature Temperature (F) 97.5   Celsius 36.3   Temperature Source oral   Pulse Pulse 70   Respirations Respirations 20   Systolic BP Systolic BP 893   Diastolic BP (mmHg) Diastolic BP (mmHg) 57   Mean BP 79   Pulse Ox % Pulse Ox % 98   Pulse Ox Activity Level  At rest   Oxygen Delivery 4L; Nasal Cannula   Pulse Ox Heart Rate 78     Impression 74 year old gentleman with a past medical history of coronary artery disease, bypass surgery, mitral valve replacement with prosthetic valve on Coumadin, COPD, history of chronic atrial fibrillation, diabetes, hypertension, hyperlipidemia, PNA in 03/2010, admission fort COPD exacerbation in sept 2012, requiring ABX and prednisone for SOB/URI in 11/2011, presenting with a week of chest congestion, cough, sob. In ER, heart rate noted to be elevated, SOB improved on  Bipap.  he received lasix, diltiazem, bipap, steroids with some improvement in sx.  Now off diltiazem, still with chest congestion and cough despite diuretic. BUN and creatinine climbing.  Symptoms concerning for COPD exacerbation/possible bronchitis,  similar to  03/2010 and sept 2012, (previous PNA). He has not had significant problems with CHF over the past few years.  BNP is elevated, likely secondary to right heart stretch from underlying pulmonary HTN and lung disease. He smoked for 30 years.  A/P: 1) SOB: Suggestive of COPD exacerbation, possible pnuemonia, possible component of diastolic CHF. unable to exclude mild CHF in setting of atrial fib with RVR   He does have EF 40 to 45% (mildly depressed) in 2012 ---Needs ABX, consider prednisone Xopenex inh q4 --Will decrease lasix to po given climb in BUN and creatinine  2) CAD, s/p cabg sx of chest pain, likely from congestion and coughing. Would monitor cardiac enz, continue outpt meds So far negative  3) S/P MVR mechanical valve. He is on warfarin 2.5 mg daily, 5 mg two days a week last echo 12/2010  4) Diabetes: would continue outpt meds.  5) Atrial fibrillation Would restart coreg BID,  digoxin rate improved. Suspect elevated rate from SOB from underlying lung condition,   Electronic Signatures: Ida Rogue (MD)  (Signed 15-Jan-13 18:51)  Authored: General Aspect/Present Illness, History and Physical Exam, Review of System, Past Medical History, Health Issues, Home Medications, Labs, EKG , Radiology, Allergies, Vital Signs/Nurse's Notes, Impression/Plan   Last Updated: 15-Jan-13 18:51 by Ida Rogue (MD)

## 2015-04-16 DIAGNOSIS — H3532 Exudative age-related macular degeneration: Secondary | ICD-10-CM | POA: Diagnosis not present

## 2015-04-22 NOTE — Discharge Summary (Signed)
PATIENT NAME:  [name], [name] MR#:  [id number] DATE OF BIRTH:  May 12, 1941  DATE OF ADMISSION:  02/18/2015 DATE OF DISCHARGE:  02/20/2015  ADMITTING PHYSICIAN: Valentino Nose, MD 02/20/2015.   DISCHARGING PHYSICIAN: Gladstone Lighter, MD   PRIMARY CARE PHYSICIAN: Maryland Pink, MD  PRIMARY CARDIOLOGIST: Ida Rogue, MD   [place]: Cardiology consultation with Dr. Rockey Situ.   DISCHARGE DIAGNOSES:  1. Acute hypoxic respiratory failure secondary to chronic obstructive pulmonary disease and congestive heart failure exacerbation.  2. Chronic obstructive pulmonary disease, on home oxygen.  3. Acute systolic congestive heart failure exacerbation, ejection fraction of 30% to 35%.  4. Hypertension.  5. Coronary artery disease status post bypass graft surgery.  6. Diabetes mellitus.  7. Atrial fibrillation on Coumadin.  8. Status post mechanical mitral valve replacement surgery on Coumadin.   DISCHARGE MEDICATIONS: 1. Metformin 500 mg p.o. b.i.d.  2. Digoxin 125 mcg p.o. daily.  3. Latanoprost ophthalmic ointment 1 drop each eye bedtime once a day.  4. Simvastatin 40 mg p.o. bedtime.  5. PreserVision capsule daily in the morning.  6. Flomax 0.4 mg p.o. daily.  7. Lasix 20 mg p.o. daily.  8. Losartan 100 mg 1/2 tablet p.o. daily.  9. Mucinex 600 mg p.o. b.i.d.  10. Spiriva handihaler 2 puffs once a day.  11. Cholecalciferol 1000 international units p.o. daily.  12. Warfarin 2.5 mg 2 tablets on Wednesday and Saturday.  13. Warfarin 2.5 mg 1 tablet at bedtime on Monday, Tuesday, Thursday, Friday and Sunday.  14. Coreg 12.5 mg p.o. b.i.d.  15. Symbicort 160/4.5 mcg 2 puffs twice a day.  16. DuoNeb 3 mL every 6 hours p.r.n. for shortness of breath.  17. Levaquin 500 mg p.o. daily for 4 days.    DISCHARGE DIET: Low Sodium Diet  DISCHARGE ACTIVITY: As tolerated.    FOLLOWUP INSTRUCTIONS:  1. Cardiology followup in 1 week.  2. PCP followup in 1 to 2 weeks.  3. Advised  to hold Coumadin for today and check INR at Coumadin clinic tomorrow and see if Coumadin can be restarted and dose adjusted. Goal INR is 2.5 to 3.5.   LABORATORIES AND IMAGING STUDIES PRIOR TO DISCHARGE: INR is 3.7 prior to discharge. Serum sodium is 136, potassium 3.8, chloride 108, bicarbonate 28, BUN 31, creatinine 1.68, glucose 280, calcium of 8.3. Troponin 0.08 and 0.10. Hba1c 6.7. Chest x-ray showing airspace opacity lung bases, likely reflecting edema. Moderate to prominent cardiomegaly, prior CABG changes noted.   WBC 8.8, hemoglobin 13.3, hematocrit 39.8, platelet count is 130.   BRIEF HOSPITAL COURSE: Mr. Kirtz is a 74 year old Caucasian male with past medical history significant for chronic atrial fibrillation on Coumadin, mechanical mitral valve replacement surgery, coronary artery disease status post bypass graft surgery, COPD, systolic CHF, was supposed to be on 2 liters home oxygen, as needed. Presents to the hospital secondary to dyspnea and hypoxia.   1. Acute hypoxic respiratory failure secondary to chronic obstructive pulmonary disease and congestive heart failure exacerbation and mild bronchitis as well. The patient was out of town for a couple of days prior to admission. He did not take his inhalers and did not follow a sodium restricted diet. He is supposed to be on oxygen, but only uses it as needed. He is active at baseline and working and does not need oxygen. He was noted to be hypoxic with saturations in the 70s on presentation. He was diuresed with Lasix in the Emergency Room and also in the hospital and  restarted back on his oral Lasix. His inhalers were restarted and he also had bronchitis symptoms, for which Levaquin has been started. He was monitored for 2 days and his saturations have improved and he is able to ambulate without any difficulty breathing. He is being discharged.  2. Atrial fibrillation on Coumadin and mechanical mitral valve and replacement surgery on Coumadin.  Because of the Levaquin, he was strongly advised to monitor his INR levels, as that might decrease the INR. He follows up with Coumadin clinic with Dr. Rockey Situ and agreed to follow up and have the dose adjusted.  3. All of his other home medications are being continued without any changes.   DISCHARGE CONDITION: Stable.   DISCHARGE DISPOSITION: Home.   TIME SPENT ON DISCHARGE: 40 minutes.   ____________________________ Gladstone Lighter, MD rk:ap D: 02/21/2015 16:35:20 ET T: 02/22/2015 16:32:15 ET JOB#: [id number]  cc: Gladstone Lighter, MD, <Dictator> Minna Merritts, MD Irven Easterly. Kary Kos, MD Gladstone Lighter MD ELECTRONICALLY SIGNED 03/08/2015 17:58

## 2015-04-22 NOTE — H&P (Signed)
PATIENT NAME:  [name], [name] MR#:  [id number] DATE OF BIRTH:  1941/03/29  DATE OF ADMISSION:  02/18/2015  REFERRING PHYSICIAN: Algis Liming. Jimmye Norman, MD  PRIMARY CARE PHYSICIAN: Irven Easterly. Kary Kos, MD  CARDIOLOGIST: Minna Merritts, MD, [facility].   CHIEF COMPLAINT: Cough and shortness of breath.   HISTORY OF PRESENT ILLNESS: A 74 year old Caucasian male with a history of chronic atrial fibrillation, mechanical mitral valve replacement, coronary artery disease status post CABG, presenting with shortness of breath. Describes a 3- to 4-day duration of cough productive of whitish sputum. His cough has been worsening, with associated shortness of breath, subjective fevers, chills. He had 1 episode of nausea and vomiting today, thus decided to present to the hospital for further workup and evaluation. Despite the above symptoms, he actually denies any orthopnea, chest pain, or edema. Of note, he does have oxygen at home; however, apparently has not needed it or used this since December 2015. On EMS arrival, he was saturating in the 70s on 2 L nasal cannula. Also noted to be in atrial fibrillation with rapid ventricular response, heart rate in the 140s to 150s, received a dose of Cardizem per EMS. Brought to the hospital. His condition has, fortunately, improved.   REVIEW OF SYSTEMS:  CONSTITUTIONAL: Positive for subjective fevers, chills, fatigue, weakness.  EYES: Denies blurry vision, double vision, or eye pain. EARS, NOSE, AND THROAT: Denies tinnitus, ear pain, hearing loss.  RESPIRATORY: Positive for cough, shortness of breath as described above.  CARDIOVASCULAR: Denies chest pain, palpitations, edema.  GASTROINTESTINAL: Positive for nausea, vomiting as described above. Denies diarrhea or abdominal pain.  GENITOURINARY: Denies dysuria or hematuria.  ENDOCRINE: Denies nocturia or thyroid problems. HEMATOLOGIC AND LYMPHATIC: Denies easy bruising, bleeding.  SKIN: Denies rash or  lesion.  MUSCULOSKELETAL: Denies pain in neck, back, shoulder, knees, hips or arthritic symptoms.  NEUROLOGIC: Denies paralysis or paresthesias.  PSYCHIATRIC: Denies anxiety or depressive symptoms.  Otherwise, full review of systems performed by me is negative.   PAST MEDICAL HISTORY: Includes coronary artery disease status post CABG; mechanical mitral valve replacement; atrial fibrillation chronically; type 2 diabetes, non-insulin-requiring, uncomplicated; essential hypertension; hyperlipidemia, unspecified; history of BPH.   SOCIAL HISTORY: Remote tobacco use, otherwise fully independent for activities of daily living.   FAMILY HISTORY: Positive for coronary artery disease as well as diabetes.   ALLERGIES: ALTACE, CODEINE, SULFA DRUGS, AS WELL AS TEA.   HOME MEDICATIONS: Include losartan 100 mg p.o. daily; Flomax 0.4 mg p.o. daily; digoxin 125 mcg p.o. daily; warfarin 2.5 mg on Monday, Tuesday, Thursday, Friday; warfarin 5 mg on Wednesdays and Saturdays; metformin 500 mg p.o. b.i.d.; simvastatin 40 mg p.o. at bedtime; Coreg 12.5 mg p.o. b.i.d.; Symbicort 80/4.5 mcg inhalation, 1 puff b.i.d. as needed; Lasix 20 mg 2 tablets p.o. daily; Mucinex 600 mg p.o. daily as needed; latanoprost ophthalmic drops 1 drop to each eye daily; multivitamins 1 tab p.o. daily.   PHYSICAL EXAMINATION:  VITAL SIGNS: Temperature 100.9, heart rate 79, respirations 26, blood pressure 126/57, saturating 90% on 2 L nasal cannula, currently saturating 94% on 4 L nasal cannula. Weight 82.1 kg, BMI 27.5.  GENERAL: Well-nourished, well-developed, Caucasian gentleman currently in minimal distress given respiratory status.  HEAD: Normocephalic, atraumatic.  EYES: Pupils equal, round, reactive to light. Extraocular muscles intact. No scleral icterus.  MOUTH: Moist mucosal membrane. Dentition intact. No abscess noted. EARS, NOSE, AND THROAT: Clear without exudates. No external lesions.  NECK: Supple. No thyromegaly. No  nodules. No JVD.  PULMONARY: Diminished breath sounds throughout all lung fields with coarse rhonchi, most prominent at right base. No wheezing noted. Tachypneic; however, no use of accessory muscles. Poor respiratory effort as stated above.  CHEST: Nontender to palpation.  CARDIOVASCULAR: S1, S2. Irregular rate, irregular rhythm. No murmurs, rubs, gallops. He has trace edema of the left lower extremity. Pedal pulses 2+ bilaterally.  GASTROINTESTINAL: Soft, nontender, nondistended. No masses. Positive bowel sounds. No hepatosplenomegaly.  MUSCULOSKELETAL: No swelling, clubbing. Trace edema as described above of left lower extremity, which is chronic. Otherwise, full range of motion in all extremities.  NEUROLOGIC: Cranial nerves II through XII intact. No gross focal neurological deficits. Sensation intact. Reflexes intact.  SKIN: No ulceration, lesions, rashes, or cyanosis. Skin warm, dry. Turgor intact.  PSYCHIATRIC: Mood, affect within normal limits. The patient is awake, alert, oriented x 3. Insight and judgment intact.   LABORATORY DATA: Chest x-ray performed which reveals indistinct airspace opacities of the lung bases with moderate cardiomegaly. EKG performed reveals atrial fibrillation. No ST or T wave abnormalities. Sodium of 142, potassium 4.4, chloride 109, bicarbonate 23, BUN 24, creatinine 1.34, glucose of 190. BNP 7432. LFTs are within normal limits. Troponin of 0.06. WBC of 8.8, hemoglobin 13.3, platelets of 130,000. INR of 2.9.   ASSESSMENT AND PLAN: a 74 year old Caucasian gentleman with a history of chronic atrial fibrillation, mechanical mitral valve, and coronary artery disease status post coronary artery bypass graft presenting with shortness of breath and cough.  1.  Acute on chronic respiratory failure secondary to a combination of community-acquired pneumonia as well as pulmonary edema, likely from atrial fibrillation, rapid ventricular response. Provide supplemental oxygen to  keep oxygen saturation greater than 92%. DuoNeb treatments q. 4 hours and antibiotic coverage with Levaquin and titrate oxygen as patient requires and wean oxygen as patient tolerates.  2.  Atrial fibrillation with rapid ventricular response. Currently rate controlled after 1 dose of p.r.n. Cardizem. We will continue with telemetry. Trend cardiac enzymes x 3. Goal heart rate will be  less than 120. Can redose Cardizem if required. Continue warfarin for anticoagulation.  3.  Elevated troponin, likely related to demand ischemia given recent rapid ventricular response. Once again, continue with telemetry. Trend cardiac enzymes. Initiate aspirin and statin therapy. Would not further anticoagulate at this time, as he is therapeutic on warfarin.  4.  Type 2 diabetes, uncomplicated. Hold p.o. agents. Add insulin sliding scale, q. 6 hour Accu-Cheks.  5.  Hypertension, essential. Continue Coreg and losartan. 6.  Venous thromboembolism prophylaxis. Currently therapeutic with warfarin.  CODE STATUS: Full code.   TIME SPENT: 45 minutes.    ____________________________ Aaron Mose. Jessilynn Taft, MD dkh:ST D: 02/18/2015 21:02:37 ET T: 02/19/2015 00:05:40 ET JOB#: [id number]  cc: Aaron Mose. Shaterra Sanzone, MD, <Dictator> Sagan Maselli Woodfin Ganja MD ELECTRONICALLY SIGNED 02/19/2015 4:19

## 2015-04-22 NOTE — Consult Note (Signed)
General Aspect Primary Cardiologist: Dr. Rockey Situ, MD _______________  74 year old male with history of coronary artery disease, s/p CABG with a left internal mammary to the LAD and saphenous vein graft to the OM in 1989, PCI of the RCA in 2002, COPD, mitral valve replacement with mechanical prosthetic valve on Coumadin in 2007, chronic atrial fibrillation, diabetes, hypertension, hyperlipidemia, PNA in 03/2010, history of perforated bowel 2013 s/p repair at [facility] who presented to [facility] 2/28 with increased SOB, cough productive of white sputumm, and was found to be in a-fib RVR by EMS.  ______________  PMH: 1. Coronary artery disease, s/p CABG with a left internal mammary to the LAD and saphenous vein graft to the OM in 1989, PCI of the RCA in 2002 2. COPD 3. Mitral valve replacement with mechanical prosthetic valve on Coumadin in 2007 4. Chronic a-fib on Coumadin 5. DM2 6. HTN 7. HLD 8. PNA 03/2010 9. history of perforated bowel 2013 s/p repair at [facility] 2/2 prednisone usage ________________   Present Illness 74 year old male with the above problem list who presented to [facility] on 2/28 with the above CC.  He has known CAD s/p 2 vessel CABG in 1989 s/p PCI of the RCA in 2002. Last nuclear stress test 11/2012 that showed no significant ischemia. There was a large region of scar noted in the basal to apical lateral and inferolateral wall consistent with old MI. This was consistent with history and EKG findings. EF 54%. Study may not have been accurate given frequent ectopy and BBB. No EKG changes concerning for ischemia. Overall low risk scan. Echo 10/2014 showed an EF 30-35% (improved from previous EF of 20-25%),  akinesis of the inferior and posterior myocardium. Hypokinesis of the apical and lateral wall. Anterior and anteroseptal walls best preserved. A mechanical prosthesis was present. Well seated. Moderately dilated LA.   Back in 2013 he was hospitalized for COPD exacerbation. He was treated with ABX,  nebs, and steroids. He suffered a perforated bowel with air under his diaphragm and was transferred to [facility] and within 48 hours, had surgery performed. He did well s/p surgery. 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 He had a 24 pound weight loss through this procedure, open abdominal wall wound healed by secondary intention.   He was traveling to [place] over the end of this past week to visit with some family, who were sick as well. While he was down there he began to develop a productive cough of whiteish sputum. He did not have his inhalers with him. He did have his Coumadin with him. On the way back up to [place] over the weekend his cough worsened. He coughed all the way back up to [place]. He sat in traffic and got frustrated. He finally got back home and hooked up his O2. He found that his O2 was at 79%. He slowly climbed up to 86%. He called EMS for further evaluation. Upon there arrival he was found to be in a-fib with RVR with HR in the 150s, per H and P. No record of this. He received diltiazem IV x 1 with improvement of HR into the 80s. He has remained in the 80s since. He denied any chest pain. Labs showed troponin of 0.06-->0.10-->0.08. CXR with indistinct airspace opacities. pBNP 7432. He did note blood mixed with his sputum overnight. No frank hemoptysis. he was started on Levaquin.   Physical Exam:  GEN well developed, no acute distress   HEENT hearing intact to voice   NECK supple  no JVD   RESP normal resp effort  rhonchi   CARD Irregular rate and rhythm  No murmur   ABD denies tenderness  soft   EXTR negative edema, no erythema, swelling, cording, or TTP   SKIN normal to palpation   NEURO cranial nerves intact   PSYCH alert   Review of Systems:  Subjective/Chief Complaint cough, SOB   General: Fatigue   Skin: No Complaints   ENT: No Complaints   Eyes: No Complaints    Neck: No Complaints   Respiratory: Frequent cough  Short of breath  Wheezing  Sputum   Cardiovascular: Dyspnea    Gastrointestinal: No Complaints   Genitourinary: No Complaints   Vascular: No Complaints   Musculoskeletal: No Complaints   Neurologic: No Complaints   Hematologic: No Complaints   Endocrine: No Complaints   Psychiatric: No Complaints   Review of Systems: All other systems were reviewed and found to be negative   Medications/Allergies Reviewed Medications/Allergies reviewed   Family & Social History:  Family and Social History:  Family History Coronary Artery Disease  Hypertension  Diabetes Mellitus  COPD  Cancer   Social History positive  tobacco, negative ETOH, negative Illicit drugs   + Tobacco Prior (greater than 1 year)   Place of Living Home     hard of hearing:    glaucoma:    cataract removal:    Prostate Cancer: May 2010   COPD:    Myocardial Infarct:    Hypertension:    Hypercholesterolemia:    Atrial Fibrillation:    CHF:    Diabetes Mellitus, Type II (NIDD):    gout:    Hernia Repair:    artificial valve--mechanical:    CABG (Coronary Artery Bypass Graft):    v-fib:          Admit Diagnosis:   ACUTE RESPIRATORY FAILURE: Onset Date: 19-Feb-2015, Status: Active, Description: ACUTE RESPIRATORY FAILURE  Home Medications:  Medication Instructions Status  budesonide-formoterol 160 mcg-4.5 mcg/inh inhalation aerosol 2 puff(s) inhaled 2 times a day Active  albuterol-ipratropium 2.5 mg-0.5 mg/3 mL inhalation solution 3 milliliter(s) inhaled every 6 hours, As Needed - for Shortness of Breath - for Wheezing  Active  metformin 500 mg oral tablet 1 tab(s) orally 2 times a day Active  carvedilol 12.5 mg oral tablet 1 tab(s) orally 2 times a day Active  digoxin 125 mcg (0.125 mg) oral tablet 1 tab(s) orally once a day (in the morning) Active  latanoprost ophthalmic 1 drop(s) to each affected eye once a day (at bedtime) Active   simvastatin 40 mg oral tablet 1 tab(s) orally once a day (at bedtime) Active  PreserVision oral capsule 1 cap(s) orally once a day (in the morning) Active  warfarin 2.5 mg oral tablet 1 tab(s) orally once a day (at bedtime) on Mon - Tues - Thurs-Friday and Sunday Active  tamsulosin 0.4 mg oral capsule 1 cap(s) orally once a day Active  furosemide 20 mg oral tablet 1 tab(s) orally once a day (in the morning) Active  losartan 100 mg oral tablet 0.5 tab(s) orally once a day (in the morning) Active  Mucinex 600 mg oral tablet, extended release 1 tab(s) orally every 12 hours Active  Spiriva Respimat 2.5 mcg/inh inhalation aerosol 2 puff(s) inhaled once a day Active  cholecalciferol 1000 intl units oral capsule 1 cap(s) orally once a day Active  warfarin 2.5 mg  oral tablet 2 tab(s) orally wednesday and Saturday Active   Lab Results:  Hepatic:  28-Feb-16 18:56   Bilirubin, Total 0.6  Alkaline Phosphatase 49  SGPT (ALT) 20  SGOT (AST) 34  Total Protein, Serum 7.2  Albumin, Serum 3.9  Routine Chem:  28-Feb-16 18:56   Glucose, Serum  190  BUN  24  Creatinine (comp)  1.34  Sodium, Serum 142  Potassium, Serum 4.4  Chloride, Serum  109  CO2, Serum 23  Calcium (Total), Serum 8.8  Anion Gap 10  Osmolality (calc) 292  eGFR (African American) >60  eGFR (Non-African American)  55 (eGFR values <12m/min/1.73 m2 may be an indication of chronic kidney disease (CKD). Calculated eGFR, using the MRDR Study equation, is useful in  patients with stable renal function. The eGFR calculation will not be reliable in acutely ill patients when serum creatinine is changing rapidly. It is not useful in patients on dialysis. The eGFR calculation may not be applicable to patients at the low and high extremes of body sizes, pregnant women, and vegetarians.)  Result Comment POTASSIUM/CREATININE/BUN - Slight hemolysis, interpret results with AST/TOTAL PROTEIN/CK TOTA - caution.  Result(s) reported on 18 Feb 2015 at 07:36PM.  B-Type Natriuretic Peptide ([facility]  [id number](Result(s) reported on 18 Feb 2015 at 07:36PM.)  Cardiac:  28-Feb-16 18:56   CK, Total 92  CPK-MB, Serum 3.2 (Result(s) reported on 18 Feb 2015 at 07:44PM.)  Troponin I  0.06 (0.00-0.05 0.05 ng/mL or less: NEGATIVE  Repeat testing in 3-6 hrs  if clinically indicated. >0.05 ng/mL: POTENTIAL  MYOCARDIAL INJURY. Repeat  testing in 3-6 hrs if  clinically indicated. NOTE: An increase or decrease  of 30% or more on serial  testing suggests a  clinically important change)  29-Feb-16 00:20   Troponin I  0.10 (0.00-0.05 0.05 ng/mL or less: NEGATIVE  Repeat testing in 3-6 hrs  if clinically indicated. >0.05 ng/mL: POTENTIAL  MYOCARDIAL INJURY. Repeat  testing in 3-6 hrs if  clinically indicated. NOTE: An increase or decrease  of 30% or more on serial  testing suggests a  clinically important change)    04:18   Troponin I  0.08 (0.00-0.05 0.05 ng/mL or less: NEGATIVE  Repeat testing in 3-6 hrs  if clinically indicated. >0.05 ng/mL: POTENTIAL  MYOCARDIAL INJURY. Repeat  testing in 3-6 hrs if  clinically indicated. NOTE: An increase or decrease  of 30% or more on serial  testing suggests a  clinically important change)  Routine Hem:  28-Feb-16 18:56   WBC (CBC) 8.8  RBC (CBC)  4.37  Hemoglobin (CBC) 13.3  Hematocrit (CBC)  39.8  Platelet Count (CBC)  130 (Result(s) reported on 18 Feb 2015 at 07:14PM.)  MCV 91  MCH 30.5  MCHC 33.5  RDW  15.8   EKG:  EKG Interp. by me   Interpretation EKG shows a-fib, RBBB, frequent PVCs, no significant st/t changes   Radiology Results: XRay:    28-Feb-16 19:20, Chest Portable Single View  Chest Portable Single View   REASON FOR EXAM:    Shortness of Breath  COMMENTS:       PROCEDURE: DXR - DXR PORTABLE CHEST SINGLE VIEW  - Feb 18 2015  7:20PM     CLINICAL DATA:  Cough.  Shortness of breath.  Near syncope.    EXAM:  PORTABLE CHEST - 1 VIEW    COMPARISON:   06/19/2014    FINDINGS:  Moderate to prominent cardiomegaly. Prior CABG. Atherosclerotic  aortic arch. Cerclage wires along  right-sided ribs.  Indistinct airspace opacities at the lung bases without overt  obscuration of the hemidiaphragms.     IMPRESSION:  1. Indistinct airspace opacities at the lung bases, potentially  reflecting mild edema or possibly aspiration pneumonitis.  2. Moderate to prominent cardiomegaly. Prior CABG and  atherosclerosis.      Electronically Signed    By: Van Clines M.D.    On: 02/18/2015 19:39       Verified By: Carron Curie, M.D.,    Sulfa drugs: Hives, Rash  Altace: Hives  Tea: Fever  Codeine: Other  Vital Signs/Nurse's Notes: **Vital Signs.:   29-Feb-16 03:59  Vital Signs Type Routine  Temperature Temperature (F) 97.7  Celsius 36.5  Temperature Source oral  Pulse Pulse 84  Respirations Respirations 18  Systolic BP Systolic BP 95  Diastolic BP (mmHg) Diastolic BP (mmHg) 53  Mean BP 67  Pulse Ox % Pulse Ox % 98  Pulse Ox Activity Level  At rest  Oxygen Delivery 3L    Impression 74 year old male with history of coronary artery disease, s/p CABG with a left internal mammary to the LAD and saphenous vein graft to the OM in 1989, PCI of the RCA in 2002, COPD, mitral valve replacement with mechanical prosthetic valve on Coumadin in 2007, chronic atrial fibrillation, diabetes, hypertension, hyperlipidemia, PNA in 03/2010, history of perforated bowel 2013 s/p repair at [facility] who presented to [facility] 2/28 with increased SOB, cough productive of white sputumm, and was found to be in a-fib RVR by EMS.  1. Acute on chronic respiratory failure: -Likely 2/2 COPD exacerbation vs community acquired PNA -Levaquin per IM -Would avoid steroids as these have previously led to perforated colon back in 2013  -Nebs per IM  2. Chronic a-fib: -Rate controlled in the 80s -Continue current treatment with Coreg 12.5 mg bid and digoxin 0.125 mg  daily -Coumadin per pharmacy  3. Chronic systolic CHF: Stable, no acute issues -Lasix 20 mg daily (home dose) -Digoxin as above -Coreg as above -Recent echo 10/2014 per HPI, no need to repeat at this time  4. Elevated troponin: -Likely 2/2 demand ischemia in the setting of COPD exacerbation -Flat/down trending -No chest pain -No ischemic evaluation planned at this time -Discontinue aspirin 81, on Coumadin as above, no role for dual therapy (increased risk of bleeding)  5. DM2: -Per IM  6. HTN: -Controlled -Continued meds as above   Electronic Signatures: Rise Mu (PA-C)  (Signed 29-Feb-16 10:15)  Authored: General Aspect/Present Illness, History and Physical Exam, Review of System, Family & Social History, Past Medical History, Home Medications, Labs, EKG , Radiology, Allergies, Vital Signs/Nurse's Notes, Impression/Plan Ida Rogue (MD)  (Signed 29-Feb-16 18:33)  Authored: General Aspect/Present Illness, History and Physical Exam, Review of System, Family & Social History, Health Issues, Home Medications, EKG , Impression/Plan  Co-Signer: General Aspect/Present Illness, History and Physical Exam, Review of System, Family & Social History, Past Medical History, Home Medications, Labs, EKG , Radiology, Allergies, Vital Signs/Nurse's Notes, Impression/Plan   Last Updated: 29-Feb-16 18:33 by Ida Rogue (MD)

## 2015-04-25 ENCOUNTER — Ambulatory Visit (INDEPENDENT_AMBULATORY_CARE_PROVIDER_SITE_OTHER): Payer: Medicare Other

## 2015-04-25 DIAGNOSIS — I4891 Unspecified atrial fibrillation: Secondary | ICD-10-CM | POA: Diagnosis not present

## 2015-04-25 DIAGNOSIS — Z9889 Other specified postprocedural states: Secondary | ICD-10-CM

## 2015-04-25 DIAGNOSIS — Z7901 Long term (current) use of anticoagulants: Secondary | ICD-10-CM | POA: Diagnosis not present

## 2015-04-25 DIAGNOSIS — Z5181 Encounter for therapeutic drug level monitoring: Secondary | ICD-10-CM | POA: Diagnosis not present

## 2015-04-25 DIAGNOSIS — I059 Rheumatic mitral valve disease, unspecified: Secondary | ICD-10-CM

## 2015-04-25 LAB — POCT INR: INR: 4.2

## 2015-05-03 ENCOUNTER — Other Ambulatory Visit: Payer: Self-pay | Admitting: Cardiovascular Disease

## 2015-05-03 NOTE — Telephone Encounter (Signed)
Please review for refill, Thank you. 

## 2015-05-04 ENCOUNTER — Ambulatory Visit
Admission: RE | Admit: 2015-05-04 | Discharge: 2015-05-04 | Disposition: A | Discharge status: Home / Self Care | Payer: Medicare Other | Source: Ambulatory Visit | Attending: Pulmonary Disease | Admitting: Pulmonary Disease

## 2015-05-04 ENCOUNTER — Telehealth: Payer: Self-pay | Admitting: Pulmonary Disease

## 2015-05-04 ENCOUNTER — Encounter: Payer: Self-pay | Admitting: Pulmonary Disease

## 2015-05-04 ENCOUNTER — Ambulatory Visit (INDEPENDENT_AMBULATORY_CARE_PROVIDER_SITE_OTHER): Payer: Medicare Other | Admitting: Pulmonary Disease

## 2015-05-04 VITALS — BP 126/70 | HR 72 | Temp 98.4°F | Ht 67.0 in | Wt 162.0 lb

## 2015-05-04 DIAGNOSIS — J449 Chronic obstructive pulmonary disease, unspecified: Secondary | ICD-10-CM | POA: Diagnosis not present

## 2015-05-04 DIAGNOSIS — R918 Other nonspecific abnormal finding of lung field: Secondary | ICD-10-CM | POA: Diagnosis not present

## 2015-05-04 DIAGNOSIS — I25810 Atherosclerosis of coronary artery bypass graft(s) without angina pectoris: Secondary | ICD-10-CM

## 2015-05-04 DIAGNOSIS — Z951 Presence of aortocoronary bypass graft: Secondary | ICD-10-CM | POA: Diagnosis not present

## 2015-05-04 DIAGNOSIS — J432 Centrilobular emphysema: Secondary | ICD-10-CM | POA: Diagnosis not present

## 2015-05-04 DIAGNOSIS — I5022 Chronic systolic (congestive) heart failure: Secondary | ICD-10-CM

## 2015-05-04 DIAGNOSIS — J13 Pneumonia due to Streptococcus pneumoniae: Secondary | ICD-10-CM | POA: Diagnosis not present

## 2015-05-04 DIAGNOSIS — J181 Lobar pneumonia, unspecified organism: Principal | ICD-10-CM

## 2015-05-04 DIAGNOSIS — J189 Pneumonia, unspecified organism: Secondary | ICD-10-CM

## 2015-05-04 MED ORDER — LEVAQUIN 750 MG PO TABS: 750.0000 mg | ORAL_TABLET | Freq: Every day | ORAL | Status: AC

## 2015-05-04 MED ORDER — PREDNISONE 10 MG PO TABS: ORAL_TABLET | ORAL | Status: DC

## 2015-05-04 MED ORDER — LEVAQUIN 750 MG PO TABS: 750.0000 mg | ORAL_TABLET | Freq: Every day | ORAL | Status: DC

## 2015-05-04 NOTE — Telephone Encounter (Signed)
Rx's have been sent in to correct pharmacy. Pt's wife is aware. Nothing further was needed.

## 2015-05-04 NOTE — Telephone Encounter (Signed)
Pt needed an appt, nothing further was needed.

## 2015-05-04 NOTE — Progress Notes (Signed)
Subjective:    Patient ID: [name], male    DOB: 05-02-1941, 74 y.o.   MRN: [id number]  Synopsis: [name] was first seen in the [facility] Pulmonary office in 10/2012 for COPD.  He had been admitted to the hospital for bronchitis in months prior.  His spirometry showed clear obstruction and an FEV1 of 48% predicted.  He also has a history of a CABG and Mitral valve replacement.  HPI   Chief Complaint  Patient presents with  . Acute Visit    Pt co increased wheezing, low 02 levels, chest tightness since last night.  Feels like he has congestion in chest but cannot cough it up.    Started wheezing last night suddenly at 9:30.  Wheezing overnight, slept with oxygen.  More short of breath this morning.  O2 saturation was 82% when walking around at home.  He wore his oxygen again and O2 would only stay at 92% RA.  No fever or chills.  He had a left sided chest pain.  He is not able to get any mucus up but he feels congested.  Past Medical History  Diagnosis Date  . Atrial fibrillation     chronic  . Hypertension   . Hyperkalemia   . Acute systolic heart failure   . Coronary artery disease     Cath 2005- patent LIMA-LAD, SVG-OM grafts, moderate MR  . Prostate cancer 04/2009  . Hyperlipidemia   . COPD (chronic obstructive pulmonary disease)     URI 9/12  . DM type 2 (diabetes mellitus, type 2)   . PNA (pneumonia) 03/2010  . Glaucoma   . Hard of hearing   . Myocardial infarction   . CHF (congestive heart failure)   . Gout   . Adenomatous polyps   . Diverticulosis     left side   . H/O mitral valve prolapse   . Sigmoid diverticulitis     perforated      Review of Systems  Constitutional: Negative for fever, chills and fatigue.  HENT: Negative for congestion, postnasal drip and rhinorrhea.   Respiratory: Positive for cough. Negative for choking and shortness of breath.   Cardiovascular: Positive for chest pain. Negative for palpitations and leg swelling.   Gastrointestinal: Negative for nausea, abdominal pain, diarrhea, constipation, abdominal distention and rectal pain.       Objective:   Physical Exam  Filed Vitals:   05/04/15 0907  BP: 126/70  Pulse: 72  Temp: 98.4 F (36.9 C)  TempSrc: Oral  Height: 5\' 7"  (1.702 m)  Weight: 162 lb (73.483 kg)  SpO2: 98%  RA  Gen:  no acute distress HEENT: NCAT, EOMi, OP clear PULM: Few crackles in bases CV: Irreg Irreg, mechanical S1, no JVD AB: BS+, soft, nontender, no hsm Ext: warm, no edema, no clubbing, no cyanosis   10/25/2012 CXR >> cardiomegally, no clear infiltrate 10/25/2012 Spirometry >> clear obstruction on flow volume loop, FEV1 48% predicted 11/2012 CT chest >> LLL pneumonia, enlarged mediastinal lymph nodes 02/22/2013 CT chest [facility] >> complete resolution of pneumonia; mediastinal lymphadenopathy persists, 1.7 cm 4R node, 1.0cm 7 node 08/2013 CT chest > improved mediastinal lymphadenopathy 12/19/2013 > bilateral fluffy infiltrates consistent with pneumonia May 2015 CT chest> no change in chronic mediastinal lymphadenopathy     Assessment & Plan:   Pneumonia [name] has had the acute onset of shortness of breath, cough, chest congestion and left-sided chest pain. Fortunately he is not septic. However, this is in keeping with  his previous diagnoses of community acquired pneumonia. His physical exam is consistent with that as he has crackles in the focal area of the left lung more he always has pneumonia.  Because of his normal vital signs he should be up to be treated as an outpatient. However, I have advised him that if he has worsening shortness of breath he needs to go to the emergency department.  He is not wheezing too much on my exam today but he says that this is been a significant problem for him so I will treat important exacerbation of COPD as well given prednisone.  Plan: Chest x-ray Levaquin for 7 days with probiotic Prednisone taper Follow-up as previously  scheduled or sooner if worse   COPD (chronic obstructive pulmonary disease) He is having an exacerbation of his severe COPD again today. His resting O2 saturation was 98% on room air, but he is noted some desaturation at home. He has oxygen which I have advised that he use frequently for the next several days while at rest. As detailed above and going to treat him for a COPD exacerbation with prednisone.   Chronic systolic heart failure He appears euvolemic on exam today. His CHF is well-controlled today. Continue current regimen     Updated Medication List Outpatient Encounter Prescriptions as of 05/04/2015  Medication Sig  . carvedilol (COREG) 12.5 MG tablet Take 1 tablet (12.5 mg  total) by mouth 2 (two)  times daily with a meal.  . Cholecalciferol (VITAMIN D3) 1000 UNITS CAPS Take 1 capsule by mouth daily.    Marland Kitchen dextromethorphan-guaiFENesin (MUCINEX DM) 30-600 MG per 12 hr tablet Take 1 tablet by mouth every 12 (twelve) hours.  Marland Kitchen DIGOX 125 MCG tablet Take 1 tablet by mouth  daily  . furosemide (LASIX) 20 MG tablet Take 1 tablet by mouth  daily  . latanoprost (XALATAN) 0.005 % ophthalmic solution Place 1 drop into both eyes at bedtime.   Marland Kitchen losartan (COZAAR) 100 MG tablet Take one-half tablet by  mouth daily  . metFORMIN (GLUCOPHAGE) 500 MG tablet Take 500 mg by mouth 2 (two) times daily with a meal.   . Multiple Vitamins-Minerals (PRESERVISION/LUTEIN PO) Apply to eye at bedtime.  . simvastatin (ZOCOR) 40 MG tablet Take 40 mg by mouth at bedtime.   . Tamsulosin HCl (FLOMAX) 0.4 MG CAPS Take 0.4 mg by mouth daily.   . Tiotropium Bromide Monohydrate (SPIRIVA RESPIMAT) 2.5 MCG/ACT AERS Inhale 2 puffs into the lungs daily.  Marland Kitchen warfarin (COUMADIN) 2.5 MG tablet Take as directed by  coumadin clinic  . LEVAQUIN 750 MG tablet Take 1 tablet (750 mg total) by mouth daily.  . predniSONE (DELTASONE) 10 MG tablet Take 40mg  po daily for 3 days, then take 30mg  po daily for 3 days, then take 20mg  po  daily for two days, then take 10mg  po daily for 2 days   No facility-administered encounter medications on file as of 05/04/2015.

## 2015-05-04 NOTE — Assessment & Plan Note (Signed)
[name] has had the acute onset of shortness of breath, cough, chest congestion and left-sided chest pain. Fortunately he is not septic. However, this is in keeping with his previous diagnoses of community acquired pneumonia. His physical exam is consistent with that as he has crackles in the focal area of the left lung more he always has pneumonia.  Because of his normal vital signs he should be up to be treated as an outpatient. However, I have advised him that if he has worsening shortness of breath he needs to go to the emergency department.  He is not wheezing too much on my exam today but he says that this is been a significant problem for him so I will treat important exacerbation of COPD as well given prednisone.  Plan: Chest x-ray Levaquin for 7 days with probiotic Prednisone taper Follow-up as previously scheduled or sooner if worse

## 2015-05-04 NOTE — Assessment & Plan Note (Signed)
He appears euvolemic on exam today. His CHF is well-controlled today. Continue current regimen

## 2015-05-04 NOTE — Assessment & Plan Note (Signed)
He is having an exacerbation of his severe COPD again today. His resting O2 saturation was 98% on room air, but he is noted some desaturation at home. He has oxygen which I have advised that he use frequently for the next several days while at rest. As detailed above and going to treat him for a COPD exacerbation with prednisone.

## 2015-05-04 NOTE — Patient Instructions (Signed)
We will call you with the results of the chest x-ray Take the Levaquin for 7 days with a probiotic like yogurt Take the prednisone taper we prescribed If you develop worsening shortness of breath or just feel worse over the weekend then you need to go to the emergency room We will see you back as previously scheduled but come back sooner if no improvement

## 2015-05-09 ENCOUNTER — Ambulatory Visit (INDEPENDENT_AMBULATORY_CARE_PROVIDER_SITE_OTHER): Payer: Medicare Other

## 2015-05-09 DIAGNOSIS — Z5181 Encounter for therapeutic drug level monitoring: Secondary | ICD-10-CM

## 2015-05-09 DIAGNOSIS — Z7901 Long term (current) use of anticoagulants: Secondary | ICD-10-CM | POA: Diagnosis not present

## 2015-05-09 DIAGNOSIS — I059 Rheumatic mitral valve disease, unspecified: Secondary | ICD-10-CM

## 2015-05-09 DIAGNOSIS — Z9889 Other specified postprocedural states: Secondary | ICD-10-CM

## 2015-05-09 DIAGNOSIS — I4891 Unspecified atrial fibrillation: Secondary | ICD-10-CM

## 2015-05-09 LAB — POCT INR: INR: 4

## 2015-05-16 DIAGNOSIS — H4011X2 Primary open-angle glaucoma, moderate stage: Secondary | ICD-10-CM | POA: Diagnosis not present

## 2015-05-23 ENCOUNTER — Telehealth: Payer: Self-pay | Admitting: Pulmonary Disease

## 2015-05-23 DIAGNOSIS — J432 Centrilobular emphysema: Secondary | ICD-10-CM

## 2015-05-23 NOTE — Telephone Encounter (Signed)
Patient says that Dr. Lake Bells was to put him on a Portable oxygen unit.  There are no orders in [facility] for Portable Oxygen.  Patient also requesting a prescription for Mucinex, he says that it costs him too much to pay for it over the counter and he would like to have his insurance pay for it through prescription.    BQ- please advise.

## 2015-05-24 NOTE — Telephone Encounter (Signed)
Spoke with pt regarding Mucinex.  He will check with pharmacy and see if he can get the Guaifenesin in place of brand Mucinex.  (Can also give samples of Mucinex that we have in office that have $2 coupons attached - Pt will call back if he needs this)   Also informed pt that order was placed to have [facility] evaluate him for a POC.

## 2015-05-24 NOTE — Telephone Encounter (Signed)
I don't think there is a prescription option for mucinex Explain he can get generic guaifenesin, have him ask the pharmacist and explain I want him to take 600mg  guaifenesin twice a day  I'm OK with POC, if he qualifies, he has had O2 at home for years, may need to come back for qualifying walk

## 2015-05-25 ENCOUNTER — Encounter: Payer: Self-pay | Admitting: Internal Medicine

## 2015-05-30 ENCOUNTER — Ambulatory Visit (INDEPENDENT_AMBULATORY_CARE_PROVIDER_SITE_OTHER): Payer: Medicare Other

## 2015-05-30 DIAGNOSIS — Z9889 Other specified postprocedural states: Secondary | ICD-10-CM

## 2015-05-30 DIAGNOSIS — I4891 Unspecified atrial fibrillation: Secondary | ICD-10-CM

## 2015-05-30 DIAGNOSIS — I059 Rheumatic mitral valve disease, unspecified: Secondary | ICD-10-CM | POA: Diagnosis not present

## 2015-05-30 DIAGNOSIS — Z5181 Encounter for therapeutic drug level monitoring: Secondary | ICD-10-CM | POA: Diagnosis not present

## 2015-05-30 DIAGNOSIS — Z7901 Long term (current) use of anticoagulants: Secondary | ICD-10-CM

## 2015-05-30 LAB — POCT INR: INR: 2.9

## 2015-06-06 DIAGNOSIS — L03119 Cellulitis of unspecified part of limb: Secondary | ICD-10-CM | POA: Diagnosis not present

## 2015-06-06 DIAGNOSIS — L02419 Cutaneous abscess of limb, unspecified: Secondary | ICD-10-CM | POA: Diagnosis not present

## 2015-06-06 DIAGNOSIS — Z7901 Long term (current) use of anticoagulants: Secondary | ICD-10-CM | POA: Diagnosis not present

## 2015-06-08 DIAGNOSIS — Z7901 Long term (current) use of anticoagulants: Secondary | ICD-10-CM | POA: Diagnosis not present

## 2015-06-11 ENCOUNTER — Ambulatory Visit (INDEPENDENT_AMBULATORY_CARE_PROVIDER_SITE_OTHER): Payer: Medicare Other | Admitting: *Deleted

## 2015-06-11 DIAGNOSIS — I059 Rheumatic mitral valve disease, unspecified: Secondary | ICD-10-CM

## 2015-06-11 DIAGNOSIS — Z9889 Other specified postprocedural states: Secondary | ICD-10-CM

## 2015-06-11 DIAGNOSIS — Z7901 Long term (current) use of anticoagulants: Secondary | ICD-10-CM | POA: Diagnosis not present

## 2015-06-11 DIAGNOSIS — Z5181 Encounter for therapeutic drug level monitoring: Secondary | ICD-10-CM

## 2015-06-11 DIAGNOSIS — I4891 Unspecified atrial fibrillation: Secondary | ICD-10-CM | POA: Diagnosis not present

## 2015-06-11 LAB — POCT INR: INR: 3

## 2015-06-12 DIAGNOSIS — H4011X2 Primary open-angle glaucoma, moderate stage: Secondary | ICD-10-CM | POA: Diagnosis not present

## 2015-06-13 DIAGNOSIS — H3532 Exudative age-related macular degeneration: Secondary | ICD-10-CM | POA: Diagnosis not present

## 2015-06-18 ENCOUNTER — Other Ambulatory Visit: Payer: Self-pay

## 2015-07-11 ENCOUNTER — Ambulatory Visit (INDEPENDENT_AMBULATORY_CARE_PROVIDER_SITE_OTHER): Payer: Medicare Other

## 2015-07-11 DIAGNOSIS — I059 Rheumatic mitral valve disease, unspecified: Secondary | ICD-10-CM

## 2015-07-11 DIAGNOSIS — Z9889 Other specified postprocedural states: Secondary | ICD-10-CM

## 2015-07-11 DIAGNOSIS — I4891 Unspecified atrial fibrillation: Secondary | ICD-10-CM

## 2015-07-11 DIAGNOSIS — Z5181 Encounter for therapeutic drug level monitoring: Secondary | ICD-10-CM

## 2015-07-11 DIAGNOSIS — Z7901 Long term (current) use of anticoagulants: Secondary | ICD-10-CM | POA: Diagnosis not present

## 2015-07-11 LAB — POCT INR: INR: 3.4

## 2015-08-01 ENCOUNTER — Ambulatory Visit (INDEPENDENT_AMBULATORY_CARE_PROVIDER_SITE_OTHER): Payer: Medicare Other | Admitting: Pulmonary Disease

## 2015-08-01 ENCOUNTER — Encounter: Payer: Self-pay | Admitting: Pulmonary Disease

## 2015-08-01 VITALS — BP 140/68 | HR 62 | Temp 97.1°F | Ht 67.0 in | Wt 163.4 lb

## 2015-08-01 DIAGNOSIS — I25810 Atherosclerosis of coronary artery bypass graft(s) without angina pectoris: Secondary | ICD-10-CM

## 2015-08-01 DIAGNOSIS — J13 Pneumonia due to Streptococcus pneumoniae: Secondary | ICD-10-CM

## 2015-08-01 MED ORDER — LEVOFLOXACIN 500 MG PO TABS: 500.0000 mg | ORAL_TABLET | Freq: Every day | ORAL | Status: AC

## 2015-08-01 MED ORDER — PREDNISONE 10 MG PO TABS: ORAL_TABLET | ORAL | Status: DC

## 2015-08-01 NOTE — Progress Notes (Signed)
Subjective:    Patient ID: [name], male    DOB: [birth year], 74 y.o.   MRN: [id number]  Synopsis: [name] was first seen in the [facility] office in 10/2012 for COPD.  He had been admitted to the hospital for bronchitis in months prior.  His spirometry showed clear obstruction and an FEV1 of 48% predicted.  He also has a history of a CABG and Mitral valve replacement.  HPI   Chief Complaint  Patient presents with  . Acute Visit    Pt c/o increase in SOB, wheezing, prod cough white mucus, orthopnea x 5 days. Pt denies CP/tightness, swelling, f/c/s, and hemoptysis.    [name] has been feeling lousy lately with increasing cough and mucus production.   He says the symptoms have been doing on for 7 days now.  It started with wheezing and lack of sleep.  He got a little better on Saturday, but by Tuesday (yesterday) he was much more short of breath with more wheezing.  Worse when lying flat. Last night he couldn't get up any mucus.  This morning when he used his inhaler he produced white, frothy sputum.  Slightly brown.  No blood.  No fever or chills today, but he had a chill on Sunday night.    Past Medical History  Diagnosis Date  . Atrial fibrillation     chronic  . Hypertension   . Hyperkalemia   . Acute systolic heart failure   . Coronary artery disease     Cath 2005- patent LIMA-LAD, SVG-OM grafts, moderate MR  . Prostate cancer 04/2009  . Hyperlipidemia   . COPD (chronic obstructive pulmonary disease)     URI 9/12  . DM type 2 (diabetes mellitus, type 2)   . PNA (pneumonia) 03/2010  . Glaucoma   . Hard of hearing   . Myocardial infarction   . CHF (congestive heart failure)   . Gout   . Adenomatous polyps   . Diverticulosis     left side   . H/O mitral valve prolapse   . Sigmoid diverticulitis     perforated      Review of Systems  Constitutional: Negative for fever, chills and fatigue.  HENT: Negative for congestion, postnasal drip and rhinorrhea.    Respiratory: Positive for cough, shortness of breath and wheezing. Negative for choking.   Cardiovascular: Negative for chest pain, palpitations and leg swelling.  Gastrointestinal: Negative for nausea, abdominal pain, diarrhea, constipation, abdominal distention and rectal pain.       Objective:   Physical Exam  Filed Vitals:   08/01/15 0957  BP: 140/68  Pulse: 62  Temp: 97.1 F (36.2 C)  TempSrc: Oral  Height: 5\' 7"  (1.702 m)  Weight: 163 lb 6.4 oz (74.118 kg)  SpO2: 94%  RA  Gen:  no acute distress HEENT: NCAT, EOMi, OP clear PULM: Few crackles in bases CV: Irreg Irreg, mechanical S1, no JVD AB: BS+, soft, nontender, no hsm Ext: warm, no edema, no clubbing, no cyanosis   10/25/2012 CXR >> cardiomegally, no clear infiltrate 10/25/2012 Spirometry >> clear obstruction on flow volume loop, FEV1 48% predicted 11/2012 CT chest >> LLL pneumonia, enlarged mediastinal lymph nodes 02/22/2013 CT chest [facility] >> complete resolution of pneumonia; mediastinal lymphadenopathy persists, 1.7 cm 4R node, 1.0cm 7 node 08/2013 CT chest > improved mediastinal lymphadenopathy 12/19/2013 > bilateral fluffy infiltrates consistent with pneumonia May 2015 CT chest> no change in chronic mediastinal lymphadenopathy     Assessment & Plan:  Pneumonia Unfortunately [name] is having another episode of pneumonia. He has his typical findings of crackles in the left lung with increased mucus production fatigue and dyspnea. Fortunately he is still ambulatory and his vital signs are stable. If we do not treat this now however he will get worse.  Plan: Take Levaquin for 7 days with the probiotic If no improvement and wheezing in 2 days start prednisone taper Continue prior dilators Continue oxygen Hold off on chest x-ray for now as this is a classic syndrome for him, but I have warned him that if he develops worsening dyspnea or shortness of breath we will need to get a chest x-ray.    Updated Medication  List Outpatient Encounter Prescriptions as of 08/01/2015  Medication Sig  . carvedilol (COREG) 12.5 MG tablet Take 1 tablet (12.5 mg  total) by mouth 2 (two)  times daily with a meal.  . Cholecalciferol (VITAMIN D3) 1000 UNITS CAPS Take 1 capsule by mouth daily.    Marland Kitchen dextromethorphan-guaiFENesin (MUCINEX DM) 30-600 MG per 12 hr tablet Take 1 tablet by mouth every 12 (twelve) hours.  Marland Kitchen DIGOX 125 MCG tablet Take 1 tablet by mouth  daily  . furosemide (LASIX) 20 MG tablet Take 1 tablet by mouth  daily  . latanoprost (XALATAN) 0.005 % ophthalmic solution Place 1 drop into both eyes at bedtime.   Marland Kitchen losartan (COZAAR) 100 MG tablet Take one-half tablet by  mouth daily  . metFORMIN (GLUCOPHAGE) 500 MG tablet Take 500 mg by mouth 2 (two) times daily with a meal.   . Multiple Vitamins-Minerals (PRESERVISION/LUTEIN PO) Apply to eye at bedtime.  . simvastatin (ZOCOR) 40 MG tablet Take 40 mg by mouth at bedtime.   . Tamsulosin HCl (FLOMAX) 0.4 MG CAPS Take 0.4 mg by mouth daily.   . Tiotropium Bromide Monohydrate (SPIRIVA RESPIMAT) 2.5 MCG/ACT AERS Inhale 2 puffs into the lungs daily.  Marland Kitchen warfarin (COUMADIN) 2.5 MG tablet Take as directed by  coumadin clinic  . levofloxacin (LEVAQUIN) 500 MG tablet Take 1 tablet (500 mg total) by mouth daily.  . predniSONE (DELTASONE) 10 MG tablet Take 40mg  po daily for 3 days, then take 30mg  po daily for 3 days, then take 20mg  po daily for two days, then take 10mg  po daily for 2 days  . [DISCONTINUED] predniSONE (DELTASONE) 10 MG tablet Take 40mg  po daily for 3 days, then take 30mg  po daily for 3 days, then take 20mg  po daily for two days, then take 10mg  po daily for 2 days   No facility-administered encounter medications on file as of 08/01/2015.

## 2015-08-01 NOTE — Patient Instructions (Signed)
Take the Levaquin for 7 days with a probiotic If you're not feeling better in 2 days start taking the prednisone I prescribed Keep taking her inhalers as you're doing I'll see you back in 2 months or sooner if needed If you find that your developing fever, worsening shortness of breath or just generally feeling worse low me know in we will order a chest x-ray.

## 2015-08-01 NOTE — Assessment & Plan Note (Signed)
Unfortunately [name] is having another episode of pneumonia. He has his typical findings of crackles in the left lung with increased mucus production fatigue and dyspnea. Fortunately he is still ambulatory and his vital signs are stable. If we do not treat this now however he will get worse.  Plan: Take Levaquin for 7 days with the probiotic If no improvement and wheezing in 2 days start prednisone taper Continue prior dilators Continue oxygen Hold off on chest x-ray for now as this is a classic syndrome for him, but I have warned him that if he develops worsening dyspnea or shortness of breath we will need to get a chest x-ray.

## 2015-08-08 ENCOUNTER — Ambulatory Visit (INDEPENDENT_AMBULATORY_CARE_PROVIDER_SITE_OTHER): Payer: Medicare Other

## 2015-08-08 DIAGNOSIS — Z9889 Other specified postprocedural states: Secondary | ICD-10-CM | POA: Diagnosis not present

## 2015-08-08 DIAGNOSIS — I4891 Unspecified atrial fibrillation: Secondary | ICD-10-CM

## 2015-08-08 DIAGNOSIS — Z5181 Encounter for therapeutic drug level monitoring: Secondary | ICD-10-CM

## 2015-08-08 DIAGNOSIS — I059 Rheumatic mitral valve disease, unspecified: Secondary | ICD-10-CM | POA: Diagnosis not present

## 2015-08-08 DIAGNOSIS — Z7901 Long term (current) use of anticoagulants: Secondary | ICD-10-CM

## 2015-08-08 LAB — POCT INR: INR: 2

## 2015-08-13 ENCOUNTER — Other Ambulatory Visit: Payer: Self-pay | Admitting: Cardiovascular Disease

## 2015-08-14 NOTE — Telephone Encounter (Signed)
Please review for refill. Thanks!  

## 2015-08-14 NOTE — Telephone Encounter (Signed)
Warfarin refill ?

## 2015-08-17 ENCOUNTER — Telehealth: Payer: Self-pay | Admitting: *Deleted

## 2015-08-17 NOTE — Telephone Encounter (Signed)
Pt calling stating last night pt had some indigestion and some pains in chest And this morning its still going on He is states it is something different or he would not call states he doesn't like coming to doctors but this feels odd. Please advise pt wanted to come by and be checked out.

## 2015-08-17 NOTE — Telephone Encounter (Signed)
S/w pt to inform him that neither Dr. Rockey Situ nor Thurmond Butts have openings today. Suggested to pt that if he is having CP, to go to ER or Urgent Care. Pt states that if he goes to Urgent Care, they will just send him to ER therefore, he doesn't want to go there. Suggested he go to ER to be seen. Scheduled 6 month follow up for 9/8 at 10:40am Pt verbalized understanding with no further questions.

## 2015-08-17 NOTE — Telephone Encounter (Signed)
S/w pt who states past 2-3 days he has been waking up at night with low oxygen. Reports 82-83%. Put on oxygen, levels increased. States yesterday, he felt like he had indigestion. Did not take any medication for symptoms. Reports chest pain awakened him last night. Resolved on its own and  was able to go back to sleep. Dull ache now in center of his chest. Comes and goes. Gets better on own. Denies pain to left arm, sweating, nauseated feeling. Would like to be seen today if possible. Informed him I would see if any openings and CB. Pt asks we call back [phone] or [phone]

## 2015-08-29 ENCOUNTER — Ambulatory Visit (INDEPENDENT_AMBULATORY_CARE_PROVIDER_SITE_OTHER): Payer: Medicare Other

## 2015-08-29 DIAGNOSIS — Z5181 Encounter for therapeutic drug level monitoring: Secondary | ICD-10-CM

## 2015-08-29 DIAGNOSIS — I4891 Unspecified atrial fibrillation: Secondary | ICD-10-CM

## 2015-08-29 DIAGNOSIS — Z7901 Long term (current) use of anticoagulants: Secondary | ICD-10-CM

## 2015-08-29 DIAGNOSIS — I059 Rheumatic mitral valve disease, unspecified: Secondary | ICD-10-CM

## 2015-08-29 DIAGNOSIS — Z9889 Other specified postprocedural states: Secondary | ICD-10-CM | POA: Diagnosis not present

## 2015-08-29 LAB — POCT INR: INR: 4.7

## 2015-08-30 ENCOUNTER — Encounter: Payer: Self-pay | Admitting: Cardiovascular Disease

## 2015-08-30 ENCOUNTER — Ambulatory Visit (INDEPENDENT_AMBULATORY_CARE_PROVIDER_SITE_OTHER): Payer: Medicare Other | Admitting: Cardiovascular Disease

## 2015-08-30 VITALS — BP 115/65 | HR 59 | Ht 66.0 in | Wt 162.5 lb

## 2015-08-30 DIAGNOSIS — I10 Essential (primary) hypertension: Secondary | ICD-10-CM

## 2015-08-30 DIAGNOSIS — E118 Type 2 diabetes mellitus with unspecified complications: Secondary | ICD-10-CM

## 2015-08-30 DIAGNOSIS — I4891 Unspecified atrial fibrillation: Secondary | ICD-10-CM | POA: Diagnosis not present

## 2015-08-30 DIAGNOSIS — Z9889 Other specified postprocedural states: Secondary | ICD-10-CM

## 2015-08-30 DIAGNOSIS — I25810 Atherosclerosis of coronary artery bypass graft(s) without angina pectoris: Secondary | ICD-10-CM | POA: Diagnosis not present

## 2015-08-30 DIAGNOSIS — R079 Chest pain, unspecified: Secondary | ICD-10-CM | POA: Diagnosis not present

## 2015-08-30 DIAGNOSIS — J432 Centrilobular emphysema: Secondary | ICD-10-CM | POA: Diagnosis not present

## 2015-08-30 DIAGNOSIS — R0902 Hypoxemia: Secondary | ICD-10-CM

## 2015-08-30 DIAGNOSIS — I5022 Chronic systolic (congestive) heart failure: Secondary | ICD-10-CM | POA: Diagnosis not present

## 2015-08-30 MED ORDER — FUROSEMIDE 20 MG PO TABS: 20.0000 mg | ORAL_TABLET | Freq: Two times a day (BID) | ORAL | Status: DC | PRN

## 2015-08-30 NOTE — Assessment & Plan Note (Signed)
Recent hypoxia  In the setting of severe underlying COPD, likely with acute on chronic systolic CHF  will use extra Lasix  For now

## 2015-08-30 NOTE — Assessment & Plan Note (Signed)
We have encouraged continued exercise, careful diet management   diabetes management primary care at [facility]

## 2015-08-30 NOTE — Progress Notes (Signed)
Patient ID: [name], male    DOB: [birth year], 74 y.o.   MRN: [id number]  HPI Comments: [name] is a very pleasant 74 year old gentleman with a past medical history of coronary artery disease, bypass surgery with a left internal mammary to the LAD and saphenous vein graft to the OM in 1989, PCI of the RCA in 2002, COPD, mitral valve replacement with prosthetic valve on Coumadin in 2007, history of chronic atrial fibrillation, diabetes, hypertension, hyperlipidemia, PNA in 03/2010, who presents for routine followup of his prosthetic mitral valve and CAD. History of colon surgery with ostomy for diverticuli   in follow-up today, he reports having significant shortness of breath approximately 2 weeks ago.  Woke up in the middle of the night , could not breathe. Also had general malaise, no energy. Symptoms have improved since that time. He does wear oxygen at nighttime.  Continues to feel weak , some shortness of breath when he bends over, some  Leg edema.   Approximately 6 weeks ago needed Levaquin 4 bronchitis, provided by pulmonary.  Did not need prednisone as symptoms got better.  Denies any chest pain or significant shortness of breath with  Exertion, does not think his symptoms are ischemia   EKG on today's visit shows atrial fibrillation with ventricular rate 59 bpm, right bundle branch block   other past medical history hospitalization 02/19/15 for bronchitis requiring IV antibiotics.   Symptoms improved without prednisone.   hospital in September 04 2011 with atrial fibrillation with RVR, chest pain, bronchitis, COPD exacerbation. He was treated with steroids, antibiotics, rate control for his atrial fibrillation with improvement of his symptoms.    hospital January 06 2012 with shortness of breath and congestion felt to have COPD exacerbation,  nebulizers, steroids. He suffered a perforated bowel with air under his diaphragm and was transferred to [facility] and within 48 hours, had  surgery performed. He presents today with an ostomy. 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 He had a 24 pound weight loss through this procedure, open abdominal wall wound healing by secondary intention.  He was discharged with underlying anemia, hematocrit 27 INR of 4  Repeat echocardiogram for shortness of breath  starting in October showed a decline in his ejection fraction to 20-30%, worsening inferior, posterior and lateral wall hypokinesis (inferior and posterior wall are almost akinetic). Prior ejection fraction 40-45%.   stress test late 2013 showing large region of scar in the inferior and inferolateral wall consistent with prior MI, no ischemia        Outpatient Encounter Prescriptions as of 08/30/2015  Medication Sig  . carvedilol (COREG) 12.5 MG tablet Take 1 tablet (12.5 mg  total) by mouth 2 (two)  times daily with a meal.  . Cholecalciferol (VITAMIN D3) 1000 UNITS CAPS Take 1 capsule by mouth daily.    Marland Kitchen dextromethorphan-guaiFENesin (MUCINEX DM) 30-600 MG per 12 hr tablet Take 1 tablet by mouth every 12 (twelve) hours.  Marland Kitchen DIGOX 125 MCG tablet Take 1 tablet by mouth  daily  . furosemide (LASIX) 20 MG tablet Take 1 tablet (20 mg total) by mouth 2 (two) times daily as needed.  . latanoprost (XALATAN) 0.005 % ophthalmic solution Place 1 drop into both eyes at bedtime.   Marland Kitchen losartan (COZAAR) 100 MG tablet Take one-half tablet by  mouth daily  . metFORMIN (GLUCOPHAGE) 500 MG tablet Take 500 mg by mouth 2 (two) times  daily with a meal.   . Multiple Vitamins-Minerals (PRESERVISION/LUTEIN PO) Apply to eye at bedtime.  . simvastatin (ZOCOR) 40 MG tablet Take 40 mg by mouth at bedtime.   . Tamsulosin HCl (FLOMAX) 0.4 MG CAPS Take 0.4 mg by mouth daily.   . Tiotropium Bromide Monohydrate (SPIRIVA RESPIMAT) 2.5 MCG/ACT AERS Inhale 2 puffs into the lungs daily.  Marland Kitchen warfarin (COUMADIN) 2.5 MG tablet  Take as directed by  Coumadin Clinic  . [DISCONTINUED] furosemide (LASIX) 20 MG tablet Take 1 tablet by mouth  daily  . [DISCONTINUED] predniSONE (DELTASONE) 10 MG tablet Take 40mg  po daily for 3 days, then take 30mg  po daily for 3 days, then take 20mg  po daily for two days, then take 10mg  po daily for 2 days (Patient not taking: Reported on 08/30/2015)   No facility-administered encounter medications on file as of 08/30/2015.   Social history Social History   Social History  . Marital Status: Married    Spouse Name: N/A  . Number of Children: 3  . Years of Education: N/A   Occupational History  . retired Air[employer] pilot    Social History Main Topics  . Smoking status: Former Smoker -- 4.00 packs/day for 23 years    Types: Cigarettes    Quit date: 12/23/1987  . Smokeless tobacco: Never Used  . Alcohol Use: No  . Drug Use: No  . Sexual Activity: Not on file   Other Topics Concern  . Not on file   Social History Narrative   Lives in [place], [place] with wife.      Review of Systems  Constitutional: Positive for fatigue.  Respiratory: Positive for shortness of breath.   Cardiovascular: Negative.   Gastrointestinal: Negative.   Musculoskeletal: Negative.   Neurological: Positive for weakness.  Psychiatric/Behavioral: Negative.   All other systems reviewed and are negative.  BP 115/65 mmHg  Pulse 59  Ht 5\' 6"  (1.676 m)  Wt 162 lb 8 oz (73.71 kg)  BMI 26.24 kg/m2  Physical Exam  Constitutional: He is oriented to person, place, and time. He appears well-developed and well-nourished.  HENT:  Head: Normocephalic.  Nose: Nose normal.  Mouth/Throat: Oropharynx is clear and moist.  Eyes: Conjunctivae are normal. Pupils are equal, round, and reactive to light.  Neck: Normal range of motion. Neck supple. No JVD present.  Cardiovascular: Normal rate, S1 normal, S2 normal and intact distal pulses.  An irregularly irregular rhythm present. Exam reveals no gallop and no friction rub.    No murmur heard. Pulmonary/Chest: Effort normal. No respiratory distress. He has no wheezes. He has no rales. He exhibits no tenderness.  Abdominal: Soft. Bowel sounds are normal. He exhibits no distension. There is no tenderness.  Musculoskeletal: Normal range of motion. He exhibits no edema or tenderness.  Lymphadenopathy:    He has no cervical adenopathy.  Neurological: He is alert and oriented to person, place, and time. Coordination normal.  Skin: Skin is warm and dry. No rash noted. No erythema.  Psychiatric: He has a normal mood and affect. His behavior is normal. Judgment and thought content normal.      Assessment and Plan   Nursing note and vitals reviewed.

## 2015-08-30 NOTE — Assessment & Plan Note (Signed)
Blood pressure is well controlled on today's visit. No changes made to the medications. 

## 2015-08-30 NOTE — Patient Instructions (Addendum)
You may have extra fluid on board, congestive heart failure  Please take lasix twice a day for one week for leg swelling and shortness of breath, Then back to one a day  Take extra lasix as needed for low oxygen level, shortness of breath, leg edema  Please call us if you have new issues that need to be addressed before your next appt.  Your physician wants you to follow-up in: 3 months.

## 2015-08-30 NOTE — Assessment & Plan Note (Signed)
Recommended he increase his Lasix up to 20 mg twice a day  For 1 week and back down to 20 mg daily  He has leg edema, clinical exam concerning for pleural effusions  Recent shortness of breath symptoms, hypoxia  Recommended he use extra Lasix after lunch as needed in addition to his morning dose

## 2015-08-30 NOTE — Assessment & Plan Note (Signed)
Repeat echocardiogram next year.

## 2015-08-30 NOTE — Assessment & Plan Note (Signed)
Heart rate well controlled, tolerating anticoagulation 

## 2015-08-30 NOTE — Assessment & Plan Note (Signed)
Long history of severe COPD , periodic exacerbations , bronchitis  Currently seems stable

## 2015-09-05 DIAGNOSIS — H3532 Exudative age-related macular degeneration: Secondary | ICD-10-CM | POA: Diagnosis not present

## 2015-09-12 ENCOUNTER — Ambulatory Visit (INDEPENDENT_AMBULATORY_CARE_PROVIDER_SITE_OTHER): Payer: Medicare Other

## 2015-09-12 DIAGNOSIS — I059 Rheumatic mitral valve disease, unspecified: Secondary | ICD-10-CM | POA: Diagnosis not present

## 2015-09-12 DIAGNOSIS — Z7901 Long term (current) use of anticoagulants: Secondary | ICD-10-CM

## 2015-09-12 DIAGNOSIS — Z9889 Other specified postprocedural states: Secondary | ICD-10-CM

## 2015-09-12 DIAGNOSIS — Z5181 Encounter for therapeutic drug level monitoring: Secondary | ICD-10-CM

## 2015-09-12 DIAGNOSIS — I4891 Unspecified atrial fibrillation: Secondary | ICD-10-CM | POA: Diagnosis not present

## 2015-09-12 LAB — POCT INR: INR: 2.6

## 2015-09-16 ENCOUNTER — Other Ambulatory Visit: Payer: Self-pay | Admitting: Cardiovascular Disease

## 2015-09-16 DIAGNOSIS — J410 Simple chronic bronchitis: Secondary | ICD-10-CM

## 2015-09-16 NOTE — Telephone Encounter (Signed)
[name] oxygen.  A prescription was faxed to [facility] as requested.  Tiffany C. Oval Linsey, MD 09/16/2015 9:59 PM

## 2015-09-24 ENCOUNTER — Encounter: Payer: Self-pay | Admitting: Physician Assistant

## 2015-09-24 ENCOUNTER — Telehealth: Payer: Self-pay | Admitting: *Deleted

## 2015-09-24 NOTE — Telephone Encounter (Signed)
Pt c/o swelling: STAT is pt has developed SOB within 24 hours  1. How long have you been experiencing swelling? 5 days  2. Where is the swelling located? Feet,ankles and lower legs  3.  Are you currently taking a "fluid pill"? Yes   4.  Are you currently SOB? Yes and can't walk far and is needing oxygen  5.  Have you traveled recently? No

## 2015-09-24 NOTE — Telephone Encounter (Signed)
Spoke w/ pt.  Advised him of Ryan's recommendation.   He is appreciative of the call and will come in on Wed @ 2:30.

## 2015-09-24 NOTE — Telephone Encounter (Signed)
Spoke w/ pt.  He reports increased swelling x 1-2 weeks in his feet, ankles and calves.  Pt is audibly SOB on the phone, reports sx worsen when walking to mailbox, his SpO2 will "drop about 10 points". Pt took lasix 20 mg BID x 1 week after last ov on 9/8, then decreased to 20mg  once daily. He reports that he has increased back to 20 mg BID, with no relief.   Pt does not add salt to his food, but does not check sodium content of foods, eats oatmeal or cereal for breakfast, sandwich w/ lunch meat for lunch and his wife cooks supper at home.  Pt drinks "at least" 3 16-oz bottles of water a day, sometimes more.  Pt does not have compression hose, but does elevate feet when sitting.  Advised pt to limit sodium and fluid intake, obtain and wear compression hose or ACE bandage, and continue to elevate feet when sitting.  Advised pt that I am making Dr. Rockey Situ aware and will call him w/ his recommendation. He requests an appt to be seen, as his breathing is becoming more difficult. Advised him that I am adding him to the waiting list in the event of a cancellation.

## 2015-09-24 NOTE — Telephone Encounter (Signed)
We can add him to Wednesday. Please have him increase his Lasix to 40 mg bid along with KCl 20 meq bid until he can be seen on Wednesday.

## 2015-09-26 ENCOUNTER — Ambulatory Visit: Payer: Medicare Other | Admitting: Physician Assistant

## 2015-09-27 ENCOUNTER — Encounter: Payer: Self-pay | Admitting: Cardiovascular Disease

## 2015-09-27 ENCOUNTER — Ambulatory Visit (INDEPENDENT_AMBULATORY_CARE_PROVIDER_SITE_OTHER): Payer: Medicare Other | Admitting: Cardiovascular Disease

## 2015-09-27 VITALS — BP 122/68 | HR 91 | Ht 66.0 in | Wt 164.0 lb

## 2015-09-27 DIAGNOSIS — R0902 Hypoxemia: Secondary | ICD-10-CM | POA: Diagnosis not present

## 2015-09-27 DIAGNOSIS — R0602 Shortness of breath: Secondary | ICD-10-CM

## 2015-09-27 DIAGNOSIS — I10 Essential (primary) hypertension: Secondary | ICD-10-CM

## 2015-09-27 DIAGNOSIS — Z9889 Other specified postprocedural states: Secondary | ICD-10-CM

## 2015-09-27 DIAGNOSIS — I25810 Atherosclerosis of coronary artery bypass graft(s) without angina pectoris: Secondary | ICD-10-CM | POA: Diagnosis not present

## 2015-09-27 DIAGNOSIS — I4891 Unspecified atrial fibrillation: Secondary | ICD-10-CM

## 2015-09-27 DIAGNOSIS — Z23 Encounter for immunization: Secondary | ICD-10-CM

## 2015-09-27 DIAGNOSIS — E118 Type 2 diabetes mellitus with unspecified complications: Secondary | ICD-10-CM

## 2015-09-27 NOTE — Assessment & Plan Note (Signed)
Echocardiogram earlier in 2016 with slightly improved ejection fraction, stable mitral valve function

## 2015-09-27 NOTE — Assessment & Plan Note (Signed)
Denies any symptoms concerning for angina though considering his worsening shortness of breath, could consider ischemia workup with stress testing if no improvement in symptoms

## 2015-09-27 NOTE — Assessment & Plan Note (Signed)
Family reports worsening shortness of breath over the past month, slight increase in leg edema. He is not sleeping in his bed, sounds like he is sleeping in a recliner which could explain his leg edema He is taking Lasix 40 mg daily over the past several weeks. Complains of continued shortness of breath Unclear at this time if he is having acute on chronic systolic CHF in the setting of his COPD BMP and CBC drawn. If renal function looks relatively normal, could increase his Lasix regimen to 40 mg twice a day. Family denies he has significant fluid intake. Weight is only up 2 pounds. If renal function is elevated concerning for prerenal state, could consider pulmonology etiology. Does not seem particularly wheezy today, unclear prednisone would help. Lastly could consider ischemia workup if no improvement. Recommended he wear oxygen at nighttime given his desaturations

## 2015-09-27 NOTE — Patient Instructions (Addendum)
We will check labs today: CBC & BMET  Please wear oxygen at night  Please call us if you have new issues that need to be addressed before your next appt.  Your physician wants you to follow-up in: 1 month.

## 2015-09-27 NOTE — Assessment & Plan Note (Signed)
Unable to exercise secondary to hypoxia. Recommended dietary restrictions medications as above as directed by primary care

## 2015-09-27 NOTE — Assessment & Plan Note (Signed)
Blood pressure is well controlled on today's visit. No changes made to the medications. 

## 2015-09-27 NOTE — Assessment & Plan Note (Signed)
Continues to be in atrial fibrillation, rate adequate though high end of his range Tolerating anticoagulation

## 2015-09-27 NOTE — Progress Notes (Signed)
Patient ID: [name], male    DOB: 1941-12-13, 74 y.o.   MRN: [id number]  HPI Comments: [name] is a very pleasant 74 year old gentleman with a past medical history of coronary artery disease, bypass surgery with a left internal mammary to the LAD and saphenous vein graft to the OM in 1989, PCI of the RCA in 2002, severe COPD, mitral valve replacement with prosthetic valve on Coumadin in 2007, history of chronic atrial fibrillation, diabetes, hypertension, hyperlipidemia, PNA in 03/2010, who presents for routine followup of his prosthetic mitral valve and CAD. History of colon surgery with ostomy for diverticuli  In follow-up today, he reports that he has had worsening shortness of breath. Increasing leg edema over the past month. Family reports desaturations at nighttime into the mid 80s. He's not wearing oxygen at night. Gets up at 3 in the morning to put oxygen on Increasing shortness of breath with exertion, has to stop more [place], did not have oxygen and was very symptomatic, had to purchase oxygen locally.   tanks from home did not last very long He is inquiring about a portable shoulder strap oxygen generator Daughter presents with him today and concerned about his symptoms. Weight is up 2 pounds from prior clinic visit. No recent lab work available   EKG on today's visit shows atrial fibrillation with ventricular rate 91 bpm, right bundle branch block   other past medical history hospitalization 02/19/15 for bronchitis requiring IV antibiotics.   Symptoms improved without prednisone.   hospital in September 04 2011 with atrial fibrillation with RVR, chest pain, bronchitis, COPD exacerbation. He was treated with steroids, antibiotics, rate control for his atrial fibrillation with improvement of his symptoms.    hospital January 06 2012 with shortness of breath and congestion felt to have COPD exacerbation,  nebulizers, steroids. He suffered a perforated  bowel with air under his diaphragm and was transferred to [facility] and within 48 hours, had surgery performed. He presents today with an ostomy. 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 He had a 24 pound weight loss through this procedure, open abdominal wall wound healing by secondary intention.  He was discharged with underlying anemia, hematocrit 27 INR of 4  Repeat echocardiogram for shortness of breath  starting in October showed a decline in his ejection fraction to 20-30%, worsening inferior, posterior and lateral wall hypokinesis (inferior and posterior wall are almost akinetic). Prior ejection fraction 40-45%.   stress test late 2013 showing large region of scar in the inferior and inferolateral wall consistent with prior MI, no ischemia        Outpatient Encounter Prescriptions as of 09/27/2015  Medication Sig  . carvedilol (COREG) 12.5 MG tablet Take 1 tablet (12.5 mg  total) by mouth 2 (two)  times daily with a meal.  . Cholecalciferol (VITAMIN D3) 1000 UNITS CAPS Take 1 capsule by mouth daily.    Marland Kitchen dextromethorphan-guaiFENesin (MUCINEX DM) 30-600 MG per 12 hr tablet Take 1 tablet by mouth every 12 (twelve) hours.  Marland Kitchen DIGOX 125 MCG tablet Take 1 tablet by mouth  daily  . furosemide (LASIX) 20 MG tablet Take 1 tablet (20 mg total) by mouth 2 (two) times daily as needed.  . latanoprost (XALATAN) 0.005 % ophthalmic solution Place 1 drop into both eyes at bedtime.   Marland Kitchen losartan (COZAAR) 100 MG tablet Take one-half tablet by  mouth daily  .  metFORMIN (GLUCOPHAGE) 500 MG tablet Take 1,000 mg by mouth 2 (two) times daily with a meal.   . Multiple Vitamins-Minerals (PRESERVISION/LUTEIN PO) Apply to eye at bedtime.  . simvastatin (ZOCOR) 40 MG tablet Take 40 mg by mouth at bedtime.   . Tamsulosin HCl (FLOMAX) 0.4 MG CAPS Take 0.4 mg by mouth daily.   . Tiotropium Bromide Monohydrate (SPIRIVA  RESPIMAT) 2.5 MCG/ACT AERS Inhale 2 puffs into the lungs daily.  Marland Kitchen warfarin (COUMADIN) 2.5 MG tablet Take as directed by  Coumadin Clinic   No facility-administered encounter medications on file as of 09/27/2015.   Social history Social History   Social History  . Marital Status: Married    Spouse Name: N/A  . Number of Children: 3  . Years of Education: N/A   Occupational History  . retired Air[employer] pilot    Social History Main Topics  . Smoking status: Former Smoker -- 4.00 packs/day for 23 years    Types: Cigarettes    Quit date: 12/23/1987  . Smokeless tobacco: Never Used  . Alcohol Use: No  . Drug Use: No  . Sexual Activity: Not on file   Other Topics Concern  . Not on file   Social History Narrative   Lives in [place], [place] with wife.      Review of Systems  Respiratory: Positive for shortness of breath.   Cardiovascular: Negative.   Gastrointestinal: Negative.   Musculoskeletal: Negative.   Neurological: Positive for weakness.  Psychiatric/Behavioral: Negative.   All other systems reviewed and are negative.  BP 122/68 mmHg  Pulse 91  Ht 5\' 6"  (1.676 m)  Wt 164 lb (74.39 kg)  BMI 26.48 kg/m2  SpO2 91%  Physical Exam  Constitutional: He is oriented to person, place, and time. He appears well-developed and well-nourished.  HENT:  Head: Normocephalic.  Nose: Nose normal.  Mouth/Throat: Oropharynx is clear and moist.  Eyes: Conjunctivae are normal. Pupils are equal, round, and reactive to light.  Neck: Normal range of motion. Neck supple. No JVD present.  Cardiovascular: Normal rate, S1 normal, S2 normal and intact distal pulses.  An irregularly irregular rhythm present. Exam reveals no gallop and no friction rub.   No murmur heard. Pulmonary/Chest: Effort normal. No respiratory distress. He has decreased breath sounds. He has no wheezes. He has no rales. He exhibits no tenderness.  Abdominal: Soft. Bowel sounds are normal. He exhibits no distension. There  is no tenderness.  Musculoskeletal: Normal range of motion. He exhibits no edema or tenderness.  Lymphadenopathy:    He has no cervical adenopathy.  Neurological: He is alert and oriented to person, place, and time. Coordination normal.  Skin: Skin is warm and dry. No rash noted. No erythema.  Psychiatric: He has a normal mood and affect. His behavior is normal. Judgment and thought content normal.      Assessment and Plan   Nursing note and vitals reviewed.

## 2015-09-28 ENCOUNTER — Telehealth: Payer: Self-pay | Admitting: Cardiovascular Disease

## 2015-09-28 LAB — BASIC METABOLIC PANEL
BUN / CREAT RATIO: 20 (ref 10–22)
BUN: 21 mg/dL (ref 8–27)
CO2: 23 mmol/L (ref 18–29)
CREATININE: 1.05 mg/dL (ref 0.76–1.27)
Calcium: 9.4 mg/dL (ref 8.6–10.2)
Chloride: 104 mmol/L (ref 97–108)
GFR, EST AFRICAN AMERICAN: 79 mL/min/{1.73_m2} (ref >59)
GFR, EST NON AFRICAN AMERICAN: 69 mL/min/{1.73_m2} (ref >59)
Glucose: 114 mg/dL — ABNORMAL HIGH (ref 65–99)
Potassium: 4.7 mmol/L (ref 3.5–5.2)
Sodium: 145 mmol/L — ABNORMAL HIGH (ref 134–144)

## 2015-09-28 LAB — CBC WITH DIFFERENTIAL/PLATELET
Basophils Absolute: 0 10*3/uL (ref 0.0–0.2)
Basos: 1 %
EOS (ABSOLUTE): 0.1 10*3/uL (ref 0.0–0.4)
EOS: 2 %
HEMOGLOBIN: 12.4 g/dL — AB (ref 12.6–17.7)
Hematocrit: 38.2 % (ref 37.5–51.0)
Immature Grans (Abs): 0 10*3/uL (ref 0.0–0.1)
Immature Granulocytes: 0 %
LYMPHS ABS: 0.8 10*3/uL (ref 0.7–3.1)
Lymphs: 20 %
MCH: 29.8 pg (ref 26.6–33.0)
MCHC: 32.5 g/dL (ref 31.5–35.7)
MCV: 92 fL (ref 79–97)
MONOCYTES: 12 %
MONOS ABS: 0.5 10*3/uL (ref 0.1–0.9)
NEUTROS ABS: 2.6 10*3/uL (ref 1.4–7.0)
Neutrophils: 65 %
Platelets: 89 10*3/uL — CL (ref 150–379)
RBC: 4.16 x10E6/uL (ref 4.14–5.80)
RDW: 15.9 % — ABNORMAL HIGH (ref 12.3–15.4)
WBC: 3.9 10*3/uL (ref 3.4–10.8)

## 2015-09-28 NOTE — Telephone Encounter (Signed)
Patient daughter Otila Kluver wants to go over lab results

## 2015-09-28 NOTE — Telephone Encounter (Signed)
Spoke w/ pt's daughter.  Advised her of Dr. Donivan Scull recommendation:  "Notes Recorded by Minna Merritts, MD on 09/28/2015 at 9:12 AM Renal function is normal Would increase Lasix up to 40 mg twice a day, cut back on fluids Would do this until leg edema improved or down 2-3 pounds Hopefully symptoms should improve If no good urination over the weekend, may change Lasix to something stronger"  Daughter states that she does not agree w/ this plan, as this was not discussed w/ her at pt's ov yesterday.  Reviewed each result w/ her and read Dr. Donivan Scull message word for word to her.  She states that she does not agree and will not give pt that much lasix.

## 2015-09-28 NOTE — Telephone Encounter (Signed)
Please call results back to daughter Otila Kluver .  She has now left work at the [employer] er and would like you to call her cell.  [phone].  Daughter sounds very anxious to speak about results.

## 2015-09-28 NOTE — Telephone Encounter (Signed)
Attempted to contact pt's daughter.  No answer, no machine at # provided.  Results have already been discussed w/ pt.

## 2015-10-02 ENCOUNTER — Ambulatory Visit: Payer: Medicare Other | Admitting: Pulmonary Disease

## 2015-10-09 ENCOUNTER — Ambulatory Visit (INDEPENDENT_AMBULATORY_CARE_PROVIDER_SITE_OTHER)
Admission: RE | Admit: 2015-10-09 | Discharge: 2015-10-09 | Disposition: A | Discharge status: Home / Self Care | Payer: Medicare Other | Source: Ambulatory Visit | Attending: Pulmonary Disease | Admitting: Pulmonary Disease

## 2015-10-09 ENCOUNTER — Ambulatory Visit (INDEPENDENT_AMBULATORY_CARE_PROVIDER_SITE_OTHER): Payer: Medicare Other | Admitting: Pulmonary Disease

## 2015-10-09 ENCOUNTER — Encounter: Payer: Self-pay | Admitting: Pulmonary Disease

## 2015-10-09 VITALS — BP 120/70 | HR 85 | Ht 66.0 in | Wt 160.2 lb

## 2015-10-09 DIAGNOSIS — R0600 Dyspnea, unspecified: Secondary | ICD-10-CM | POA: Diagnosis not present

## 2015-10-09 DIAGNOSIS — I509 Heart failure, unspecified: Secondary | ICD-10-CM | POA: Diagnosis not present

## 2015-10-09 DIAGNOSIS — J432 Centrilobular emphysema: Secondary | ICD-10-CM

## 2015-10-09 DIAGNOSIS — I25810 Atherosclerosis of coronary artery bypass graft(s) without angina pectoris: Secondary | ICD-10-CM | POA: Diagnosis not present

## 2015-10-09 DIAGNOSIS — J13 Pneumonia due to Streptococcus pneumoniae: Secondary | ICD-10-CM

## 2015-10-09 DIAGNOSIS — I5022 Chronic systolic (congestive) heart failure: Secondary | ICD-10-CM

## 2015-10-09 NOTE — Assessment & Plan Note (Signed)
[name]'s exertional dyspnea and hypoxemia has worsened recently. This is been associated with consistent cough and mucus production. He did improve significantly with treatment of pneumonia this most recent time but I'm concerned that he has not mouth back to normal. This may be just progression of his COPD, but it's also possible that there something else going on, see below.  Plan: I've instructed him to use oxygen when he exerts himself, we will prescribe a portable oxygen concentrator Keep using Symbicort and Spiriva Change Mucinex DM to guaifenesin 1200 mg 12 hour release tablet twice a day Follow-up 6 weeks or sooner if needed

## 2015-10-09 NOTE — Progress Notes (Signed)
Subjective:    Patient ID: [name], male    DOB: Aug 05, 1941, 74 y.o.   MRN: [id number]  Synopsis: [name] was first seen in the [facility] Pulmonary office in 10/2012 for COPD.  He had been admitted to the hospital for bronchitis in months prior.  His spirometry showed clear obstruction and an FEV1 of 48% predicted.  He also has a history of a CABG and Mitral valve replacement.  HPI   Chief Complaint  Patient presents with  . Follow-up    pt following for pneumonia: pt states he is doing a little better since hes last been here. pt states he is still having some SOB. pt uses O2 as needed  and his need for it has increased. pt c/o prod cough at times clear in color.    [name] says that his breathing has improved. He is still having problems with his oxygen saturation. He says that his oxygen level has been low when he exerts himself like minimal walking.  He will see it drop to 80-85% and it improves with sitting. He is using his oxygen at night.  When he wakes up at night he will check his oxygen and it will be as low as 82%. He is still bringing up mucus which is white which is pretty thick. He has mucinex DM every twelve hours. He had some leg swelling recently.    Past Medical History  Diagnosis Date  . Chronic atrial fibrillation (HCC)     a. warfarin; b. CHADSVASc 6 (CHF, HTN, age x 2, DM, vascular disease) giving him an estimated annual stroke risk of 9.8%  . Hypertension   . Hyperkalemia   . Chronic systolic CHF (congestive heart failure) ([place])     a. echo 10/2014: EF 30-35%, AK of inf & post myocardium, HK of apical & lat wall, ant & antsept wall best preserved, mitral: mechanical prosthesis was present & well seated, LA moderately dilated, RV systolic fxn mildly reduced, PASP nl  . Coronary artery disease     Cath 2005- patent LIMA-LAD, SVG-OM grafts, moderate MR  . Prostate cancer ([place]) 04/2009  . Hyperlipidemia   . COPD (chronic obstructive pulmonary disease)  ([place])     URI 9/12  . DM type 2 (diabetes mellitus, type 2) ([place])   . PNA (pneumonia) 03/2010  . Glaucoma   . Hard of hearing   . Myocardial infarction ([place])   . Gout   . Adenomatous polyps   . Diverticulosis     left side   . H/O mitral valve prolapse   . Sigmoid diverticulitis     perforated      Review of Systems  Constitutional: Negative for fever, chills and fatigue.  HENT: Negative for congestion, postnasal drip and rhinorrhea.   Respiratory: Positive for cough, shortness of breath and wheezing. Negative for choking.   Cardiovascular: Negative for chest pain, palpitations and leg swelling.  Gastrointestinal: Negative for nausea, abdominal pain, diarrhea, constipation, abdominal distention and rectal pain.       Objective:   Physical Exam  Filed Vitals:   10/09/15 1603  BP: 120/70  Pulse: 85  Height: 5\' 6"  (1.676 m)  Weight: 72.666 kg (160 lb 3.2 oz)  SpO2: 95%  RA  Gen:  no acute distress HEENT: NCAT, EOMi, OP clear PULM: Few crackles in bases bilateraly CV: Irreg Irreg, mechanical S1, no JVD AB: BS+, soft, nontender, no hsm Ext: warm, no edema, no clubbing, no cyanosis   10/25/2012  CXR >> cardiomegally, no clear infiltrate 10/25/2012 Spirometry >> clear obstruction on flow volume loop, FEV1 48% predicted 11/2012 CT chest >> LLL pneumonia, enlarged mediastinal lymph nodes 02/22/2013 CT chest [facility] >> complete resolution of pneumonia; mediastinal lymphadenopathy persists, 1.7 cm 4R node, 1.0cm 7 node 08/2013 CT chest > improved mediastinal lymphadenopathy 12/19/2013 > bilateral fluffy infiltrates consistent with pneumonia May 2015 CT chest> no change in chronic mediastinal lymphadenopathy May 2018 CXR > nodularity left lung     Assessment & Plan:   COPD (chronic obstructive pulmonary disease) Lytle's exertional dyspnea and hypoxemia has worsened recently. This is been associated with consistent cough and mucus production. He did improve significantly with  treatment of pneumonia this most recent time but I'm concerned that he has not mouth back to normal. This may be just progression of his COPD, but it's also possible that there something else going on, see below.  Plan: I've instructed him to use oxygen when he exerts himself, we will prescribe a portable oxygen concentrator Keep using Symbicort and Spiriva Change Mucinex DM to guaifenesin 1200 mg 12 hour release tablet twice a day Follow-up 6 weeks or sooner if needed  Pneumonia He's had recurrent episodes of pneumonia which we've imaged multiple times the CT scans the May 2015 which showed complete resolution. There is some very mild unchanged lymphadenopathy on multiple CT scans as well. Most recently he had pneumonia in August but he has not quite returned to normal since then. He had some nodularity on his chest x-ray in May.  Plan: Repeat chest x-ray now, depending on the findings he may need to have a repeat CT chest  Mediastinal lymphadenopathy This was stable over 18 months of imaging, but as noted above he may need to have a repeat CT chest depending on the findings from his chest x-ray.  Chronic systolic heart failure Counseled to take diuretics as prescribed by Dr. Rockey Situ    Updated Medication List Outpatient Encounter Prescriptions as of 10/09/2015  Medication Sig  . carvedilol (COREG) 12.5 MG tablet Take 1 tablet (12.5 mg  total) by mouth 2 (two)  times daily with a meal.  . Cholecalciferol (VITAMIN D3) 1000 UNITS CAPS Take 1 capsule by mouth daily.    Marland Kitchen dextromethorphan-guaiFENesin (MUCINEX DM) 30-600 MG per 12 hr tablet Take 1 tablet by mouth every 12 (twelve) hours.  Marland Kitchen DIGOX 125 MCG tablet Take 1 tablet by mouth  daily  . furosemide (LASIX) 20 MG tablet Take 1 tablet (20 mg total) by mouth 2 (two) times daily as needed.  . latanoprost (XALATAN) 0.005 % ophthalmic solution Place 1 drop into both eyes at bedtime.   Marland Kitchen losartan (COZAAR) 100 MG tablet Take one-half tablet  by  mouth daily  . metFORMIN (GLUCOPHAGE) 500 MG tablet Take 1,000 mg by mouth 2 (two) times daily with a meal.   . Multiple Vitamins-Minerals (PRESERVISION/LUTEIN PO) Apply to eye at bedtime.  . simvastatin (ZOCOR) 40 MG tablet Take 40 mg by mouth at bedtime.   . Tamsulosin HCl (FLOMAX) 0.4 MG CAPS Take 0.4 mg by mouth daily.   . Tiotropium Bromide Monohydrate (SPIRIVA RESPIMAT) 2.5 MCG/ACT AERS Inhale 2 puffs into the lungs daily.  Marland Kitchen warfarin (COUMADIN) 2.5 MG tablet Take as directed by  Coumadin Clinic   No facility-administered encounter medications on file as of 10/09/2015.

## 2015-10-09 NOTE — Assessment & Plan Note (Signed)
He's had recurrent episodes of pneumonia which we've imaged multiple times the CT scans the May 2015 which showed complete resolution. There is some very mild unchanged lymphadenopathy on multiple CT scans as well. Most recently he had pneumonia in August but he has not quite returned to normal since then. He had some nodularity on his chest x-ray in May.  Plan: Repeat chest x-ray now, depending on the findings he may need to have a repeat CT chest

## 2015-10-09 NOTE — Patient Instructions (Signed)
We will call you with the results of today's chest x-ray, depending on the findings may need to have a CT scan of your chest again Use the oxygen when you exert yourself Keep using your inhalers as you are doing Stop taking the form of mucinex that you are taking Instead, I want you to take 12 hour guaifenesin 1200 mg twice a day We will refer you to pulmonary rehabilitation in [place] I will see you back in 6 weeks or sooner if needed

## 2015-10-09 NOTE — Assessment & Plan Note (Signed)
This was stable over 18 months of imaging, but as noted above he may need to have a repeat CT chest depending on the findings from his chest x-ray.

## 2015-10-10 ENCOUNTER — Ambulatory Visit (INDEPENDENT_AMBULATORY_CARE_PROVIDER_SITE_OTHER): Payer: Medicare Other

## 2015-10-10 DIAGNOSIS — Z9889 Other specified postprocedural states: Secondary | ICD-10-CM

## 2015-10-10 DIAGNOSIS — I059 Rheumatic mitral valve disease, unspecified: Secondary | ICD-10-CM

## 2015-10-10 DIAGNOSIS — I4891 Unspecified atrial fibrillation: Secondary | ICD-10-CM | POA: Diagnosis not present

## 2015-10-10 DIAGNOSIS — Z5181 Encounter for therapeutic drug level monitoring: Secondary | ICD-10-CM

## 2015-10-10 DIAGNOSIS — Z7901 Long term (current) use of anticoagulants: Secondary | ICD-10-CM

## 2015-10-10 LAB — POCT INR: INR: 4

## 2015-10-12 NOTE — Assessment & Plan Note (Signed)
Counseled to take diuretics as prescribed by Dr. Rockey Situ

## 2015-10-16 ENCOUNTER — Telehealth: Payer: Self-pay | Admitting: Pulmonary Disease

## 2015-10-16 DIAGNOSIS — J432 Centrilobular emphysema: Secondary | ICD-10-CM

## 2015-10-16 NOTE — Telephone Encounter (Signed)
Spoke with Melissa. States that the order that was placed was not worded correctly. A new order will need to be replaced. This will be done.

## 2015-10-17 ENCOUNTER — Telehealth: Payer: Self-pay | Admitting: Pulmonary Disease

## 2015-10-17 DIAGNOSIS — R0602 Shortness of breath: Secondary | ICD-10-CM

## 2015-10-17 NOTE — Telephone Encounter (Signed)
Pt is requesting CXR results from 10/09/15.  BQ - please advise. Thanks.

## 2015-10-17 NOTE — Telephone Encounter (Signed)
I think I sent a message to [facility] The chest x ray showed fluid on his lungs. He needs to take the diuretic medications ordered by Dr Rockey Situ and have a repeat CXR in 2 weeks

## 2015-10-17 NOTE — Telephone Encounter (Signed)
Pt is aware of results. CXR order has been placed. Nothing further was needed.

## 2015-10-24 ENCOUNTER — Other Ambulatory Visit: Payer: Self-pay | Admitting: Pulmonary Disease

## 2015-10-26 DIAGNOSIS — Z23 Encounter for immunization: Secondary | ICD-10-CM | POA: Diagnosis not present

## 2015-10-26 DIAGNOSIS — S61218A Laceration without foreign body of other finger without damage to nail, initial encounter: Secondary | ICD-10-CM | POA: Diagnosis not present

## 2015-10-31 ENCOUNTER — Ambulatory Visit (INDEPENDENT_AMBULATORY_CARE_PROVIDER_SITE_OTHER): Payer: Medicare Other | Admitting: *Deleted

## 2015-10-31 DIAGNOSIS — Z9889 Other specified postprocedural states: Secondary | ICD-10-CM | POA: Diagnosis not present

## 2015-10-31 DIAGNOSIS — Z7901 Long term (current) use of anticoagulants: Secondary | ICD-10-CM

## 2015-10-31 DIAGNOSIS — I4891 Unspecified atrial fibrillation: Secondary | ICD-10-CM | POA: Diagnosis not present

## 2015-10-31 DIAGNOSIS — Z5181 Encounter for therapeutic drug level monitoring: Secondary | ICD-10-CM | POA: Diagnosis not present

## 2015-10-31 DIAGNOSIS — I059 Rheumatic mitral valve disease, unspecified: Secondary | ICD-10-CM | POA: Diagnosis not present

## 2015-10-31 LAB — POCT INR: INR: 2.9

## 2015-11-08 ENCOUNTER — Telehealth: Payer: Self-pay | Admitting: Pulmonary Disease

## 2015-11-08 ENCOUNTER — Ambulatory Visit (INDEPENDENT_AMBULATORY_CARE_PROVIDER_SITE_OTHER)
Admission: RE | Admit: 2015-11-08 | Discharge: 2015-11-08 | Disposition: A | Discharge status: Home / Self Care | Payer: Medicare Other | Source: Ambulatory Visit | Attending: Pulmonary Disease | Admitting: Pulmonary Disease

## 2015-11-08 DIAGNOSIS — R0602 Shortness of breath: Secondary | ICD-10-CM | POA: Diagnosis not present

## 2015-11-08 DIAGNOSIS — R05 Cough: Secondary | ICD-10-CM | POA: Diagnosis not present

## 2015-11-08 NOTE — Telephone Encounter (Signed)
Called and spoke to pt. Pt questioning when he should have his repeat CXR. Advised pt sometime now he could get his CXR. Pt stated he will get the CXR today. Advised pt we will call with results. Pt verbalized understanding and denied any further questions or concerns at this time.     Billy Doom, MD at 10/17/2015 1:14 PM     Status: Signed       Expand All Collapse All   I think I sent a message to [name] The chest x ray showed fluid on his lungs. He needs to take the diuretic medications ordered by Dr Rockey Situ and have a repeat CXR in 2 weeks

## 2015-11-09 ENCOUNTER — Telehealth: Payer: Self-pay | Admitting: Pulmonary Disease

## 2015-11-09 NOTE — Telephone Encounter (Signed)
Called and spoke with pt's daughter Daughter wanted a better understanding of cxr report She states that her father is very confused in regards to the results that he received this morning Informed daughter of results per BQ and that the pt has an appt with TP next week to go over the results more extensively Daughter stated that she would be with pt at the appt to make sure he understand results  Nothing further is needed at this time

## 2015-11-13 ENCOUNTER — Ambulatory Visit (INDEPENDENT_AMBULATORY_CARE_PROVIDER_SITE_OTHER)
Admission: RE | Admit: 2015-11-13 | Discharge: 2015-11-13 | Disposition: A | Discharge status: Home / Self Care | Payer: Medicare Other | Source: Ambulatory Visit | Attending: Adult Health | Admitting: Adult Health

## 2015-11-13 ENCOUNTER — Telehealth: Payer: Self-pay

## 2015-11-13 ENCOUNTER — Ambulatory Visit (INDEPENDENT_AMBULATORY_CARE_PROVIDER_SITE_OTHER): Payer: Medicare Other | Admitting: Adult Health

## 2015-11-13 ENCOUNTER — Encounter: Payer: Self-pay | Admitting: Adult Health

## 2015-11-13 ENCOUNTER — Other Ambulatory Visit (INDEPENDENT_AMBULATORY_CARE_PROVIDER_SITE_OTHER): Payer: Medicare Other

## 2015-11-13 VITALS — BP 122/72 | HR 79 | Temp 97.4°F | Ht 66.0 in | Wt 163.0 lb

## 2015-11-13 DIAGNOSIS — I25810 Atherosclerosis of coronary artery bypass graft(s) without angina pectoris: Secondary | ICD-10-CM

## 2015-11-13 DIAGNOSIS — J441 Chronic obstructive pulmonary disease with (acute) exacerbation: Secondary | ICD-10-CM

## 2015-11-13 DIAGNOSIS — R0602 Shortness of breath: Secondary | ICD-10-CM | POA: Diagnosis not present

## 2015-11-13 DIAGNOSIS — I5022 Chronic systolic (congestive) heart failure: Secondary | ICD-10-CM | POA: Diagnosis not present

## 2015-11-13 LAB — SEDIMENTATION RATE: Sed Rate: 6 mm/hr (ref 0–22)

## 2015-11-13 LAB — BRAIN NATRIURETIC PEPTIDE: Pro B Natriuretic peptide (BNP): 1778 pg/mL — ABNORMAL HIGH (ref 0.0–100.0)

## 2015-11-13 LAB — BASIC METABOLIC PANEL
BUN: 40 mg/dL — AB (ref 6–23)
CALCIUM: 9.5 mg/dL (ref 8.4–10.5)
CO2: 29 mEq/L (ref 19–32)
CREATININE: 1.51 mg/dL — AB (ref 0.40–1.50)
Chloride: 108 mEq/L (ref 96–112)
GFR: 47.9 mL/min — AB (ref >60.00)
GLUCOSE: 102 mg/dL — AB (ref 70–99)
Potassium: 4.9 mEq/L (ref 3.5–5.1)
Sodium: 144 mEq/L (ref 135–145)

## 2015-11-13 NOTE — Patient Instructions (Addendum)
Labs today .  Set up for CT Chest .  Take extra lasix daily for next 3 days.  Follow up Dr. Rockey Situ next week as planned  follow up Dr [place] Bells in 6 weeks and As needed   Please contact office for sooner follow up if symptoms do not improve or worsen or seek emergency care

## 2015-11-13 NOTE — Progress Notes (Signed)
Subjective:    Patient ID: [name], male    DOB: 1941/03/27, 75 y.o.   MRN: [id number]  HPI  74 yo male with Severe GOLD III COPD and chronic hypoxic resp failure on 2l/m act and At bedtime    TEST  FEV1 48% Echo 10/2014 with EF 30-35%  11/13/2015 Acute OV : COPD  Pt presents for an acute office visit.  Accompanied by family .  Pt complains of persistent cough and dyspnea. Minimally productive cough . Has intermittent wheezing .  Family says over last 2-3 months he has had a progressive decline in his breathing . Past summer was able to go out in yard and mow (rider) without much difficulty . Over last 2 months gets winded with minimal activity. Has oxygen At bedtime  And with activity. Using oxygen more often due to dyspnea. Would like order for portable oxygen tank. Denies fever, nausa or vomiting.   Remains on Spiriva daily .  Has known CHF w/ EF 30%, on lasix 20mg  Twice daily   Does get winded easily with act and At bedtime  When he lies down for short period then seems to go away.  Echo in 2015 showed EF 30-35%. CXR today shows stable Cardiomegaly w/ min bibasilar edema and mild bilateral pleural effusions. No sign change in xray from last year.     Past Medical History  Diagnosis Date  . Chronic atrial fibrillation (HCC)     a. warfarin; b. CHADSVASc 6 (CHF, HTN, age x 2, DM, vascular disease) giving him an estimated annual stroke risk of 9.8%  . Hypertension   . Hyperkalemia   . Chronic systolic CHF (congestive heart failure) ([place])     a. echo 10/2014: EF 30-35%, AK of inf & post myocardium, HK of apical & lat wall, ant & antsept wall best preserved, mitral: mechanical prosthesis was present & well seated, LA moderately dilated, RV systolic fxn mildly reduced, PASP nl  . Coronary artery disease     Cath 2005- patent LIMA-LAD, SVG-OM grafts, moderate MR  . Prostate cancer ([place]) 04/2009  . Hyperlipidemia   . COPD (chronic obstructive pulmonary disease) ([place])     URI  9/12  . DM type 2 (diabetes mellitus, type 2) ([place])   . PNA (pneumonia) 03/2010  . Glaucoma   . Hard of hearing   . Myocardial infarction ([place])   . Gout   . Adenomatous polyps   . Diverticulosis     left side   . H/O mitral valve prolapse   . Sigmoid diverticulitis     perforated   Current Outpatient Prescriptions on File Prior to Visit  Medication Sig Dispense Refill  . carvedilol (COREG) 12.5 MG tablet Take 1 tablet (12.5 mg  total) by mouth 2 (two)  times daily with a meal. 180 tablet 3  . Cholecalciferol (VITAMIN D3) 1000 UNITS CAPS Take 1 capsule by mouth daily.      Marland Kitchen dextromethorphan-guaiFENesin (MUCINEX DM) 30-600 MG per 12 hr tablet Take 1 tablet by mouth every 12 (twelve) hours.    Marland Kitchen DIGOX 125 MCG tablet Take 1 tablet by mouth  daily 90 tablet 3  . furosemide (LASIX) 20 MG tablet Take 1 tablet (20 mg total) by mouth 2 (two) times daily as needed. 180 tablet 3  . latanoprost (XALATAN) 0.005 % ophthalmic solution Place 1 drop into both eyes at bedtime.     Marland Kitchen losartan (COZAAR) 100 MG tablet Take one-half tablet by  mouth daily  45 tablet 3  . metFORMIN (GLUCOPHAGE) 500 MG tablet Take 1,000 mg by mouth 2 (two) times daily with a meal.     . Multiple Vitamins-Minerals (PRESERVISION/LUTEIN PO) Apply to eye at bedtime.    . simvastatin (ZOCOR) 40 MG tablet Take 40 mg by mouth at bedtime.     Marland Kitchen SPIRIVA RESPIMAT 2.5 MCG/ACT AERS Use 2 puffs daily 12 g 1  . Tamsulosin HCl (FLOMAX) 0.4 MG CAPS Take 0.4 mg by mouth daily.     Marland Kitchen warfarin (COUMADIN) 2.5 MG tablet Take as directed by  [facility]oumadin Clinic 120 tablet 1   No current facility-administered medications on file prior to visit.      Review of Systems Constitutional:   No  weight loss, night sweats,  Fevers, chills,  +fatigue, or  lassitude.  HEENT:   No headaches,  Difficulty swallowing,  Tooth/dental problems, or  Sore throat,                No sneezing, itching, ear ache, nasal congestion, post nasal drip,   CV:  No chest  pain,  Orthopnea, PND, swelling in lower extremities, anasarca, dizziness, palpitations, syncope.   GI  No heartburn, indigestion, abdominal pain, nausea, vomiting, diarrhea, change in bowel habits, loss of appetite, bloody stools.   Resp: .  No chest wall deformity  Skin: no rash or lesions.  GU: no dysuria, change in color of urine, no urgency or frequency.  No flank pain, no hematuria   MS:  No joint pain or swelling.  No decreased range of motion.  No back pain.  Psych:  No change in mood or affect. No depression or anxiety.  No memory loss.         Objective:   Physical Exam GEN: A/Ox3; pleasant , NAD, elderly , frail   HEENT:  [place]/AT,  EACs-clear, TMs-wnl, NOSE-clear, THROAT-clear, no lesions, no postnasal drip or exudate noted.   NECK:  Supple w/ fair ROM; no JVD; normal carotid impulses w/o bruits; no thyromegaly or nodules palpated; no lymphadenopathy.  RESP  Decreased BS in bases no accessory muscle use, no dullness to percussion  CARD:  RRR, no m/r/g  , tr peripheral edema, pulses intact, no cyanosis or clubbing.  GI:   Soft & nt; nml bowel sounds; no organomegaly or masses detected.  Musco: Warm bil, no deformities or joint swelling noted.   Neuro: alert, no focal deficits noted.    Skin: Warm, no lesions or rashes   CXR 11/13/15  Stable cardiomegaly and central pulmonary vascular congestion is noted with minimal bibasilar edema and mild bilateral pleural Effusions. Reviewed independently      Assessment & Plan:

## 2015-11-13 NOTE — Telephone Encounter (Signed)
Received records request [employer] , forwarded to [employer] for processing. °

## 2015-11-14 ENCOUNTER — Telehealth: Payer: Self-pay | Admitting: *Deleted

## 2015-11-14 DIAGNOSIS — R0600 Dyspnea, unspecified: Secondary | ICD-10-CM

## 2015-11-14 NOTE — Telephone Encounter (Signed)
TP called in regarding pt lab results Pt BNP (HF marker) was elevated. Cont on the lasix as discussed and order stat 2d echo to be done possible Friday? Called pt daughter Otila Kluver and [place] x[facility] at [phone] phones do cut off at 1pm and closed tomorrow but will reopen on Friday at 8 am Called pt home # and line rings busy Called mobile # and is aware of TP recs and that I did try to reach daughter. 2d echo has already been ordered.  Pt aware he be contacted to have this done. Please advise PCC's thanks

## 2015-11-14 NOTE — Telephone Encounter (Signed)
Called Tammy P on her cell and let her know that neither [facility] in [place] nor [facility] can see pt today for 2-D Echo and they are closed on 11/16/15.  I was unable to reach anyone at the Specialty area of [facility] to schedule pt there and per Jeri @[facility] they do not handle STAT Echo orders.  Starla Link states those pts are sent to the ED.  Tammy P states to try to get pt scheduled for 11/19/15 at [facility].  I scheduled pt at [facility] for 11/19/15 at 4 pm.  Called and spoke with pt, he is aware of appt info, familiar with location.

## 2015-11-18 NOTE — Assessment & Plan Note (Signed)
?   Decompensated CHF  Check bnp, check 2 D echo if elevated.   Gentle increased diuresis w/ lasix   Plan  Labs today .  Take extra lasix daily for next 3 days.  Follow up Dr. Rockey Situ next week as planned  follow up Dr Lake Bells in 6 weeks and As needed   Please contact office for sooner follow up if symptoms do not improve or worsen or seek emergency care

## 2015-11-18 NOTE — Assessment & Plan Note (Signed)
?   Flare/progressive decline +/- volume overload.  Check CT chest  Labs with bnp , if elevated consider 2 D echo   Plan  Labs today .  Set up for CT Chest .  Take extra lasix daily for next 3 days.  Follow up Dr. Rockey Situ next week as planned  follow up Dr Lake Bells in 6 weeks and As needed   Please contact office for sooner follow up if symptoms do not improve or worsen or seek emergency care

## 2015-11-19 ENCOUNTER — Ambulatory Visit: Payer: Medicare Other

## 2015-11-19 ENCOUNTER — Other Ambulatory Visit: Payer: Self-pay

## 2015-11-19 ENCOUNTER — Telehealth: Payer: Self-pay | Admitting: Adult Health

## 2015-11-19 ENCOUNTER — Encounter: Payer: Self-pay | Admitting: Cardiovascular Disease

## 2015-11-19 ENCOUNTER — Ambulatory Visit (INDEPENDENT_AMBULATORY_CARE_PROVIDER_SITE_OTHER): Payer: Medicare Other | Admitting: Cardiovascular Disease

## 2015-11-19 VITALS — BP 120/60 | HR 58 | Ht 66.0 in | Wt 164.1 lb

## 2015-11-19 DIAGNOSIS — R0602 Shortness of breath: Secondary | ICD-10-CM | POA: Diagnosis not present

## 2015-11-19 DIAGNOSIS — R0600 Dyspnea, unspecified: Secondary | ICD-10-CM

## 2015-11-19 DIAGNOSIS — I25810 Atherosclerosis of coronary artery bypass graft(s) without angina pectoris: Secondary | ICD-10-CM | POA: Diagnosis not present

## 2015-11-19 DIAGNOSIS — I4891 Unspecified atrial fibrillation: Secondary | ICD-10-CM

## 2015-11-19 DIAGNOSIS — J441 Chronic obstructive pulmonary disease with (acute) exacerbation: Secondary | ICD-10-CM | POA: Diagnosis not present

## 2015-11-19 DIAGNOSIS — I5022 Chronic systolic (congestive) heart failure: Secondary | ICD-10-CM

## 2015-11-19 DIAGNOSIS — E785 Hyperlipidemia, unspecified: Secondary | ICD-10-CM

## 2015-11-19 MED ORDER — CARVEDILOL 12.5 MG PO TABS: ORAL_TABLET | ORAL | Status: DC

## 2015-11-19 MED ORDER — DIGOXIN 125 MCG PO TABS: ORAL_TABLET | ORAL | Status: AC

## 2015-11-19 NOTE — Assessment & Plan Note (Signed)
Echocardiogram scheduled for today at 4:00. Tolerating Lasix 20 g twice a day, renal function worse suggestive of prerenal state No significant edema on exam yet still with worsening shortness of breath We will have echo guide further management, if unchanged from prior echocardiogram and right heart pressures not particularly elevated, may benefit from prednisone trial If he does not to better on prednisone, may need cardiac catheterization to rule out ischemia Does not seem to be in any particular distress on the table today but family does report his energy is down, hypoxia with walking

## 2015-11-19 NOTE — Assessment & Plan Note (Signed)
Rate well controlled, tolerating anticoagulation. 

## 2015-11-19 NOTE — Telephone Encounter (Signed)
lmtcb x1 

## 2015-11-19 NOTE — Patient Instructions (Addendum)
We will call you with the results of the echo We will also talk with Dr. Lake Bells about the results  We will watch for your CT results  We will check BMP next week with warfarin check   Please call us if you have new issues that need to be addressed before your next appt.  Your physician wants you to follow-up in: 1 month.   Pulmonology will call you when they have placed an order for your portable oxygen

## 2015-11-19 NOTE — Assessment & Plan Note (Signed)
Currently appears stable though unable to definitively exclude underlying ischemia Suspect more likely pulmonary and may benefit from prednisone trial

## 2015-11-19 NOTE — Assessment & Plan Note (Signed)
Encouraged him to stay on his simvastatin. 

## 2015-11-19 NOTE — Progress Notes (Signed)
Patient ID: [name], male    DOB: 11/16/1941, 74 y.o.   MRN: [id number]  HPI Comments: [name] is a very pleasant 74 year old gentleman with a past medical history of coronary artery disease, bypass surgery with a left internal mammary to the LAD and saphenous vein graft to the OM in 1989, PCI of the RCA in 2002, severe COPD, mitral valve replacement with prosthetic valve on Coumadin in 2007, history of chronic atrial fibrillation, diabetes, hypertension, hyperlipidemia, PNA in 03/2010, who presents for routine followup of his prosthetic mitral valve and CAD. History of colon surgery with ostomy for diverticuli  In follow-up today, he reports that he has had worsening shortness of breath. Since his last clinic visit he has been taking Lasix 20 mg twice a day Weight has been stable but his breathing has been worse This week has taken Lasix 40 mg in the morning, 20 mg in the afternoon on 3 days Daughter presents to the clinic today and states he has lower extremity edema, no significant edema per the patient He is requiring more oxygen, has more hypoxia No prednisone in the past several months  denies any significant cough, no sputum production Daughter and wife certainly report that he has not been active, not walking, not his usual self, something is not working Weight unchanged from prior clinic visit  Prior EKG shows atrial fibrillation with ventricular rate 91 bpm, right bundle branch block   other past medical history hospitalization 02/19/15 for bronchitis requiring IV antibiotics.   Symptoms improved without prednisone.   hospital in September 04 2011 with atrial fibrillation with RVR, chest pain, bronchitis, COPD exacerbation. He was treated with steroids, antibiotics, rate control for his atrial fibrillation with improvement of his symptoms.    hospital January 06 2012 with shortness of breath and congestion felt to have COPD exacerbation,  nebulizers, steroids. He suffered a  perforated bowel with air under his diaphragm and was transferred to [facility] and within 48 hours, had surgery performed. He presents today with an ostomy. 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 He had a 24 pound weight loss through this procedure, open abdominal wall wound healing by secondary intention.  He was discharged with underlying anemia, hematocrit 27 INR of 4  Repeat echocardiogram for shortness of breath  starting in October showed a decline in his ejection fraction to 20-30%, worsening inferior, posterior and lateral wall hypokinesis (inferior and posterior wall are almost akinetic). Prior ejection fraction 40-45%.   stress test late 2013 showing large region of scar in the inferior and inferolateral wall consistent with prior MI, no ischemia        Outpatient Encounter Prescriptions as of 11/19/2015  Medication Sig  . carvedilol (COREG) 12.5 MG tablet Take 1 tablet (12.5 mg  total) by mouth 2 (two)  times daily with a meal.  . Cholecalciferol (VITAMIN D3) 1000 UNITS CAPS Take 1 capsule by mouth daily.    Marland Kitchen dextromethorphan-guaiFENesin (MUCINEX DM) 30-600 MG per 12 hr tablet Take 1 tablet by mouth every 12 (twelve) hours.  . digoxin (DIGOX) 0.125 MG tablet Take 1 tablet by mouth  daily  . furosemide (LASIX) 20 MG tablet Take 1 tablet (20 mg total) by mouth 2 (two) times daily as needed.  . latanoprost (XALATAN) 0.005 % ophthalmic solution Place 1 drop into both eyes at bedtime.   Marland Kitchen losartan (COZAAR) 100 MG tablet Take one-half tablet by  mouth daily  . metFORMIN (GLUCOPHAGE) 500 MG tablet Take 1,000 mg by mouth 2 (two) times daily with a meal.   . Multiple Vitamins-Minerals (PRESERVISION/LUTEIN PO) Apply to eye at bedtime.  . simvastatin (ZOCOR) 40 MG tablet Take 40 mg by mouth at bedtime.   Marland Kitchen SPIRIVA RESPIMAT 2.5 MCG/ACT AERS Use 2 puffs daily  . Tamsulosin HCl (FLOMAX) 0.4 MG  CAPS Take 0.4 mg by mouth daily.   Marland Kitchen warfarin (COUMADIN) 2.5 MG tablet Take as directed by  Coumadin Clinic  . [DISCONTINUED] carvedilol (COREG) 12.5 MG tablet Take 1 tablet (12.5 mg  total) by mouth 2 (two)  times daily with a meal.  . [DISCONTINUED] DIGOX 125 MCG tablet Take 1 tablet by mouth  daily   No facility-administered encounter medications on file as of 11/19/2015.   Social history Social History   Social History  . Marital Status: Married    Spouse Name: N/A  . Number of Children: 3  . Years of Education: N/A   Occupational History  . retired Airline pilot    Social History Main Topics  . Smoking status: Former Smoker -- 4.00 packs/day for 23 years    Types: Cigarettes    Quit date: 12/23/1987  . Smokeless tobacco: Never Used  . Alcohol Use: No  . Drug Use: No  . Sexual Activity: Not on file   Other Topics Concern  . Not on file   Social History Narrative   Lives in [place], [place] with wife.      Review of Systems  Respiratory: Positive for shortness of breath.   Cardiovascular: Negative.   Gastrointestinal: Negative.   Musculoskeletal: Negative.   Neurological: Positive for weakness.  Psychiatric/Behavioral: Positive for dysphoric mood.  All other systems reviewed and are negative.  BP 120/60 mmHg  Pulse 58  Ht 5\' 6"  (1.676 m)  Wt 164 lb 1.9 oz (74.444 kg)  BMI 26.50 kg/m2  SpO2 89%  Physical Exam  Constitutional: He is oriented to person, place, and time. He appears well-developed and well-nourished.  HENT:  Head: Normocephalic.  Nose: Nose normal.  Mouth/Throat: Oropharynx is clear and moist.  Eyes: Conjunctivae are normal. Pupils are equal, round, and reactive to light.  Neck: Normal range of motion. Neck supple. No JVD present.  Cardiovascular: Normal rate, S1 normal, S2 normal and intact distal pulses.  An irregularly irregular rhythm present. Exam reveals no gallop and no friction rub.   No murmur heard. Pulmonary/Chest: Effort normal. No  respiratory distress. He has decreased breath sounds. He has no wheezes. He has no rales. He exhibits no tenderness.  Abdominal: Soft. Bowel sounds are normal. He exhibits no distension. There is no tenderness.  Musculoskeletal: Normal range of motion. He exhibits no edema or tenderness.  Lymphadenopathy:    He has no cervical adenopathy.  Neurological: He is alert and oriented to person, place, and time. Coordination normal.  Skin: Skin is warm and dry. No rash noted. No erythema.  Psychiatric: He has a normal mood and affect. His behavior is normal. Judgment and thought content normal.      Assessment and Plan   Nursing note and vitals reviewed.

## 2015-11-19 NOTE — Assessment & Plan Note (Signed)
On supplemental oxygen, despite this continues to have hypoxia Family reports general malaise, fatigue, weakness, inactivity

## 2015-11-20 ENCOUNTER — Inpatient Hospital Stay (HOSPITAL_COMMUNITY)
Admission: AD | Admit: 2015-11-20 | Discharge: 2015-11-25 | DRG: 292 | Disposition: A | Discharge status: Other Acute Inpatient Hospital | Payer: Medicare Other | Source: Ambulatory Visit | Attending: Internal Medicine | Admitting: Internal Medicine

## 2015-11-20 ENCOUNTER — Ambulatory Visit (HOSPITAL_BASED_OUTPATIENT_CLINIC_OR_DEPARTMENT_OTHER)
Admission: RE | Admit: 2015-11-20 | Discharge: 2015-11-20 | Disposition: A | Discharge status: Home / Self Care | Payer: Medicare Other | Source: Ambulatory Visit | Attending: Internal Medicine | Admitting: Internal Medicine

## 2015-11-20 ENCOUNTER — Encounter (HOSPITAL_COMMUNITY): Payer: Self-pay | Admitting: Internal Medicine

## 2015-11-20 VITALS — BP 98/52 | HR 74 | Wt 164.5 lb

## 2015-11-20 DIAGNOSIS — Z9981 Dependence on supplemental oxygen: Secondary | ICD-10-CM | POA: Diagnosis not present

## 2015-11-20 DIAGNOSIS — Z87891 Personal history of nicotine dependence: Secondary | ICD-10-CM | POA: Diagnosis not present

## 2015-11-20 DIAGNOSIS — I482 Chronic atrial fibrillation, unspecified: Secondary | ICD-10-CM

## 2015-11-20 DIAGNOSIS — Z79899 Other long term (current) drug therapy: Secondary | ICD-10-CM

## 2015-11-20 DIAGNOSIS — M1000 Idiopathic gout, unspecified site: Secondary | ICD-10-CM | POA: Diagnosis not present

## 2015-11-20 DIAGNOSIS — H409 Unspecified glaucoma: Secondary | ICD-10-CM | POA: Diagnosis present

## 2015-11-20 DIAGNOSIS — J449 Chronic obstructive pulmonary disease, unspecified: Secondary | ICD-10-CM | POA: Diagnosis present

## 2015-11-20 DIAGNOSIS — I272 Other secondary pulmonary hypertension: Secondary | ICD-10-CM | POA: Diagnosis present

## 2015-11-20 DIAGNOSIS — E1122 Type 2 diabetes mellitus with diabetic chronic kidney disease: Secondary | ICD-10-CM | POA: Diagnosis not present

## 2015-11-20 DIAGNOSIS — I255 Ischemic cardiomyopathy: Secondary | ICD-10-CM | POA: Diagnosis present

## 2015-11-20 DIAGNOSIS — H9190 Unspecified hearing loss, unspecified ear: Secondary | ICD-10-CM | POA: Diagnosis present

## 2015-11-20 DIAGNOSIS — Z951 Presence of aortocoronary bypass graft: Secondary | ICD-10-CM

## 2015-11-20 DIAGNOSIS — I350 Nonrheumatic aortic (valve) stenosis: Secondary | ICD-10-CM | POA: Diagnosis present

## 2015-11-20 DIAGNOSIS — E876 Hypokalemia: Secondary | ICD-10-CM | POA: Diagnosis present

## 2015-11-20 DIAGNOSIS — Z952 Presence of prosthetic heart valve: Secondary | ICD-10-CM | POA: Diagnosis not present

## 2015-11-20 DIAGNOSIS — I5023 Acute on chronic systolic (congestive) heart failure: Secondary | ICD-10-CM | POA: Diagnosis not present

## 2015-11-20 DIAGNOSIS — N179 Acute kidney failure, unspecified: Secondary | ICD-10-CM | POA: Diagnosis present

## 2015-11-20 DIAGNOSIS — Z8249 Family history of ischemic heart disease and other diseases of the circulatory system: Secondary | ICD-10-CM

## 2015-11-20 DIAGNOSIS — N183 Chronic kidney disease, stage 3 (moderate): Secondary | ICD-10-CM | POA: Diagnosis present

## 2015-11-20 DIAGNOSIS — Z452 Encounter for adjustment and management of vascular access device: Secondary | ICD-10-CM | POA: Diagnosis not present

## 2015-11-20 DIAGNOSIS — I509 Heart failure, unspecified: Secondary | ICD-10-CM | POA: Diagnosis not present

## 2015-11-20 DIAGNOSIS — Z933 Colostomy status: Secondary | ICD-10-CM

## 2015-11-20 DIAGNOSIS — Z9889 Other specified postprocedural states: Secondary | ICD-10-CM

## 2015-11-20 DIAGNOSIS — Z7984 Long term (current) use of oral hypoglycemic drugs: Secondary | ICD-10-CM | POA: Diagnosis not present

## 2015-11-20 DIAGNOSIS — I2510 Atherosclerotic heart disease of native coronary artery without angina pectoris: Secondary | ICD-10-CM | POA: Diagnosis present

## 2015-11-20 DIAGNOSIS — N178 Other acute kidney failure: Secondary | ICD-10-CM | POA: Diagnosis not present

## 2015-11-20 DIAGNOSIS — I252 Old myocardial infarction: Secondary | ICD-10-CM | POA: Diagnosis not present

## 2015-11-20 DIAGNOSIS — J90 Pleural effusion, not elsewhere classified: Secondary | ICD-10-CM | POA: Diagnosis not present

## 2015-11-20 DIAGNOSIS — E785 Hyperlipidemia, unspecified: Secondary | ICD-10-CM | POA: Diagnosis present

## 2015-11-20 DIAGNOSIS — R042 Hemoptysis: Secondary | ICD-10-CM | POA: Diagnosis not present

## 2015-11-20 DIAGNOSIS — M109 Gout, unspecified: Secondary | ICD-10-CM | POA: Diagnosis present

## 2015-11-20 DIAGNOSIS — I129 Hypertensive chronic kidney disease with stage 1 through stage 4 chronic kidney disease, or unspecified chronic kidney disease: Secondary | ICD-10-CM | POA: Diagnosis present

## 2015-11-20 DIAGNOSIS — I130 Hypertensive heart and chronic kidney disease with heart failure and stage 1 through stage 4 chronic kidney disease, or unspecified chronic kidney disease: Secondary | ICD-10-CM | POA: Diagnosis not present

## 2015-11-20 DIAGNOSIS — Z7901 Long term (current) use of anticoagulants: Secondary | ICD-10-CM

## 2015-11-20 DIAGNOSIS — E43 Unspecified severe protein-calorie malnutrition: Secondary | ICD-10-CM | POA: Diagnosis not present

## 2015-11-20 DIAGNOSIS — Z8546 Personal history of malignant neoplasm of prostate: Secondary | ICD-10-CM

## 2015-11-20 LAB — GLUCOSE, CAPILLARY: GLUCOSE-CAPILLARY: 146 mg/dL — AB (ref 65–99)

## 2015-11-20 LAB — PROTIME-INR
INR: 3.23 — AB (ref 0.00–1.49)
Prothrombin Time: 32.4 seconds — ABNORMAL HIGH (ref 11.6–15.2)

## 2015-11-20 LAB — MRSA PCR SCREENING: MRSA by PCR: POSITIVE — AB

## 2015-11-20 LAB — CBC
HEMATOCRIT: 36.1 % — AB (ref 39.0–52.0)
HEMOGLOBIN: 11.4 g/dL — AB (ref 13.0–17.0)
MCH: 29.3 pg (ref 26.0–34.0)
MCHC: 31.6 g/dL (ref 30.0–36.0)
MCV: 92.8 fL (ref 78.0–100.0)
Platelets: 91 10*3/uL — ABNORMAL LOW (ref 150–400)
RBC: 3.89 MIL/uL — ABNORMAL LOW (ref 4.22–5.81)
RDW: 16.7 % — ABNORMAL HIGH (ref 11.5–15.5)
WBC: 3.6 10*3/uL — ABNORMAL LOW (ref 4.0–10.5)

## 2015-11-20 MED ORDER — TIOTROPIUM BROMIDE MONOHYDRATE 2.5 MCG/ACT IN AERS: 5.0000 ug | INHALATION_SPRAY | Freq: Every day | RESPIRATORY_TRACT | Status: DC

## 2015-11-20 MED ORDER — MUPIROCIN 2 % EX OINT
1.0000 "application " | TOPICAL_OINTMENT | Freq: Two times a day (BID) | CUTANEOUS | Status: DC
  Administered 2015-11-20 – 2015-11-24 (×9): 1 via NASAL
  Filled 2015-11-20 (×4): qty 22

## 2015-11-20 MED ORDER — INSULIN ASPART 100 UNIT/ML ~~LOC~~ SOLN
0.0000 [IU] | Freq: Three times a day (TID) | SUBCUTANEOUS | Status: DC
  Administered 2015-11-21: 3 [IU] via SUBCUTANEOUS
  Administered 2015-11-21: 2 [IU] via SUBCUTANEOUS
  Administered 2015-11-22: 3 [IU] via SUBCUTANEOUS
  Administered 2015-11-23: 11 [IU] via SUBCUTANEOUS
  Administered 2015-11-23: 5 [IU] via SUBCUTANEOUS
  Administered 2015-11-24: 11 [IU] via SUBCUTANEOUS
  Administered 2015-11-24: 3 [IU] via SUBCUTANEOUS

## 2015-11-20 MED ORDER — DOBUTAMINE IN D5W 4-5 MG/ML-% IV SOLN
2.5000 ug/kg/min | INTRAVENOUS | Status: DC
  Administered 2015-11-20: 3 ug/kg/min via INTRAVENOUS
  Administered 2015-11-22: 20 ug/kg/min via INTRAVENOUS
  Administered 2015-11-22: 3 ug/kg/min via INTRAVENOUS
  Administered 2015-11-22: 10 ug/kg/min via INTRAVENOUS
  Administered 2015-11-22: 5 ug/kg/min via INTRAVENOUS
  Administered 2015-11-22: 15 ug/kg/min via INTRAVENOUS
  Administered 2015-11-23: 3 ug/kg/min via INTRAVENOUS
  Filled 2015-11-20: qty 250

## 2015-11-20 MED ORDER — LATANOPROST 0.005 % OP SOLN
1.0000 [drp] | Freq: Every day | OPHTHALMIC | Status: DC
Start: 2015-11-20 — End: 2015-11-25
  Administered 2015-11-21 – 2015-11-24 (×5): 1 [drp] via OPHTHALMIC
  Filled 2015-11-20 (×2): qty 2.5

## 2015-11-20 MED ORDER — DOBUTAMINE IN D5W 4-5 MG/ML-% IV SOLN
INTRAVENOUS | Status: AC
  Filled 2015-11-20: qty 250

## 2015-11-20 MED ORDER — LOSARTAN POTASSIUM 25 MG PO TABS
25.0000 mg | ORAL_TABLET | Freq: Every day | ORAL | Status: DC
  Administered 2015-11-21: 25 mg via ORAL
  Filled 2015-11-20: qty 1

## 2015-11-20 MED ORDER — FUROSEMIDE 10 MG/ML IJ SOLN
80.0000 mg | Freq: Two times a day (BID) | INTRAMUSCULAR | Status: DC
  Administered 2015-11-20 – 2015-11-21 (×3): 80 mg via INTRAVENOUS
  Filled 2015-11-20 (×3): qty 8

## 2015-11-20 MED ORDER — CARVEDILOL 12.5 MG PO TABS
12.5000 mg | ORAL_TABLET | Freq: Two times a day (BID) | ORAL | Status: DC
  Administered 2015-11-20 – 2015-11-21 (×2): 12.5 mg via ORAL
  Filled 2015-11-20 (×2): qty 1

## 2015-11-20 MED ORDER — VITAMIN D 1000 UNITS PO TABS
1000.0000 [IU] | ORAL_TABLET | Freq: Every day | ORAL | Status: DC
Start: 2015-11-21 — End: 2015-11-25
  Administered 2015-11-21 – 2015-11-24 (×4): 1000 [IU] via ORAL
  Filled 2015-11-20 (×4): qty 1

## 2015-11-20 MED ORDER — VITAMIN D3 25 MCG (1000 UT) PO CAPS: 1.0000 | ORAL_CAPSULE | Freq: Every day | ORAL | Status: DC

## 2015-11-20 MED ORDER — CETYLPYRIDINIUM CHLORIDE 0.05 % MT LIQD
7.0000 mL | Freq: Two times a day (BID) | OROMUCOSAL | Status: DC
  Administered 2015-11-20 – 2015-11-24 (×9): 7 mL via OROMUCOSAL

## 2015-11-20 MED ORDER — SIMVASTATIN 40 MG PO TABS
40.0000 mg | ORAL_TABLET | Freq: Every day | ORAL | Status: DC
  Administered 2015-11-20 – 2015-11-24 (×5): 40 mg via ORAL
  Filled 2015-11-20 (×5): qty 1

## 2015-11-20 MED ORDER — LOSARTAN POTASSIUM 50 MG PO TABS: 50.0000 mg | ORAL_TABLET | Freq: Every day | ORAL | Status: DC

## 2015-11-20 MED ORDER — SODIUM CHLORIDE 0.9 % IJ SOLN: 3.0000 mL | INTRAMUSCULAR | Status: DC | PRN

## 2015-11-20 MED ORDER — FUROSEMIDE 10 MG/ML IJ SOLN
80.0000 mg | Freq: Every day | INTRAMUSCULAR | Status: DC
  Administered 2015-11-20: 80 mg via INTRAVENOUS
  Filled 2015-11-20: qty 8

## 2015-11-20 MED ORDER — TAMSULOSIN HCL 0.4 MG PO CAPS
0.4000 mg | ORAL_CAPSULE | Freq: Every day | ORAL | Status: DC
  Administered 2015-11-21 – 2015-11-24 (×4): 0.4 mg via ORAL
  Filled 2015-11-20 (×4): qty 1

## 2015-11-20 MED ORDER — SODIUM CHLORIDE 0.9 % IV SOLN
250.0000 mL | INTRAVENOUS | Status: DC | PRN
  Administered 2015-11-20: 250 mL via INTRAVENOUS

## 2015-11-20 MED ORDER — ONDANSETRON HCL 4 MG/2ML IJ SOLN: 4.0000 mg | Freq: Four times a day (QID) | INTRAMUSCULAR | Status: DC | PRN

## 2015-11-20 MED ORDER — ACETAMINOPHEN 325 MG PO TABS
650.0000 mg | ORAL_TABLET | ORAL | Status: DC | PRN
  Administered 2015-11-23 (×3): 650 mg via ORAL
  Filled 2015-11-20 (×3): qty 2

## 2015-11-20 MED ORDER — DM-GUAIFENESIN ER 30-600 MG PO TB12
1.0000 | ORAL_TABLET | Freq: Two times a day (BID) | ORAL | Status: DC
  Administered 2015-11-21 (×2): 1 via ORAL
  Filled 2015-11-20 (×2): qty 1

## 2015-11-20 MED ORDER — SODIUM CHLORIDE 0.9 % IJ SOLN
3.0000 mL | Freq: Two times a day (BID) | INTRAMUSCULAR | Status: DC
  Administered 2015-11-21 – 2015-11-24 (×4): 3 mL via INTRAVENOUS

## 2015-11-20 MED ORDER — DIGOXIN 125 MCG PO TABS
0.1250 mg | ORAL_TABLET | Freq: Every day | ORAL | Status: DC
  Administered 2015-11-21 – 2015-11-24 (×4): 0.125 mg via ORAL
  Filled 2015-11-20 (×4): qty 1

## 2015-11-20 MED ORDER — METFORMIN HCL 500 MG PO TABS: 1000.0000 mg | ORAL_TABLET | Freq: Two times a day (BID) | ORAL | Status: DC

## 2015-11-20 MED ORDER — CHLORHEXIDINE GLUCONATE CLOTH 2 % EX PADS
6.0000 | MEDICATED_PAD | Freq: Every day | CUTANEOUS | Status: DC
  Administered 2015-11-21 – 2015-11-23 (×3): 6 via TOPICAL

## 2015-11-20 NOTE — H&P (Addendum)
ADVANCED HF CLINIC NOTE  PCP: Dr. Kary Kos Primary Cardiologist: Rockey Situ Pulmonary: Lake Bells  HPI:  Mr. Crisman is a 74 year old gentleman with CAD s/p CABG 1989, PCI of the RCA in 2002, severe COPD, CKD 3, s/p mechanical mitral valve replacement in 2007, chronic atrial fibrillation, diabetes, hypertension, hyperlipidemia, PNA in 03/2010, h/o diverticular disease s/p colon resection with ostomy referred by Dr. Rockey Situ for worsening HF.   Has had worsening HF symptoms since July. Much worse over last few weeks. Now dyspneic with any exertion. + orthopnea. No PND. Sleeps with O2. Has been increasing lasix intermittently to 60 daily. Now on 40 daily. Weight stable but appetite poor. + ankle edema. Occasional R-sided CP.   Echos reviewed personally in clinic: Echo 11/15: EF 30-25%  Echo 11/19/15 with worsening EF 20-25% with at least moderate AS (concern for low -gradient severe AS), moderate RV dysfunction, MV ok RVSP 64mmHG Flat septum.  Prior EKG shows atrial fibrillation with ventricular rate 91 bpm, right bundle branch block   Review of Systems: [y] = yes, [ ]  = no    General: Weight gain [] ; Weight loss [ ] ; Anorexia [ ] ; Fatigue [y]; Fever [ ] ; Chills [ ] ; Weakness Blue.Reese ]   Cardiac: Chest pain/pressure [y]; Resting SOB [y]; Exertional SOB [y]; Orthopnea [y]; Pedal Edema [ y]; Palpitations [ ] ; Syncope [ ] ; Presyncope [ ] ; Paroxysmal nocturnal dyspnea[ ]    Pulmonary: Cough [y]; Wheezing[ ] ; Hemoptysis[ ] ; Sputum [] ; Snoring [ ]    GI: Vomiting[ ] ; Dysphagia[ ] ; Melena[ ] ; Hematochezia [ ] ; Heartburn[ ] ; Abdominal pain Blue.Reese ]; Constipation [ ] ; Diarrhea [ ] ; BRBPR [ ]    GU: Hematuria[ ] ; Dysuria [ ] ; Nocturia[ ]   Vascular: Pain in legs with walking [ ] ; Pain in feet with lying flat [ ] ; Non-healing sores [ ] ; Stroke [ ] ; TIA [ ] ; Slurred speech [ ] ;   Neuro: Headaches[ ] ; Vertigo[ ] ; Seizures[ ] ; Paresthesias[ ] ;Blurred vision [ ] ; Diplopia [ ] ; Vision changes [ ]    Ortho/Skin:  Arthritis Blue.Reese ]; Joint pain [ y]; Muscle pain [ ] ; Joint swelling [ ] ; Back Pain [ ] ; Rash [ ]    Psych: Depression[ ] ; Anxiety[ ]    Heme: Bleeding problems [ ] ; Clotting disorders [ ] ; Anemia [ ]    Endocrine: Diabetes [y]; Thyroid dysfunction[ ]   SH:  Social History   Social History  . Marital Status: Married    Spouse Name: N/A  . Number of Children: 3  . Years of Education: N/A   Occupational History  . retired Airline pilot    Social History Main Topics  . Smoking status: Former Smoker -- 4.00 packs/day for 23 years    Types: Cigarettes    Quit date: 12/23/1987  . Smokeless tobacco: Never Used  . Alcohol Use: No  . Drug Use: No  . Sexual Activity: Not on file   Other Topics Concern  . Not on file   Social History Narrative   Lives in [place], [place] with wife.     FH:  Family History  Problem Relation Age of Onset  . Coronary artery disease Mother   . Heart attack Mother     x10  . Heart disease Mother   . Stroke Father   . Heart disease Father   . Heart attack Brother     with bypass   . Heart attack Sister     with bypass   . Cancer Sister     bone  . Pancreatic cancer Brother  pancreatic  . Heart disease Maternal Grandmother   . Heart disease Maternal Grandfather   . Leukemia Sister   . Cancer Maternal Aunt     ? type    Past Medical History  Diagnosis Date  . Chronic atrial fibrillation (HCC)     a. warfarin; b. CHADSVASc 6 (CHF, HTN, age x 2, DM, vascular disease) giving him an estimated annual stroke risk of 9.8%  . Hypertension   . Hyperkalemia   . Chronic systolic CHF (congestive heart failure) ([place])     a. echo 10/2014: EF 30-35%, AK of inf & post myocardium, HK of apical & lat wall, ant & antsept wall best preserved, mitral: mechanical prosthesis was present & well seated, LA  moderately dilated, RV systolic fxn mildly reduced, PASP nl  . Coronary artery disease     Cath 2005- patent LIMA-LAD, SVG-OM grafts, moderate MR  . Prostate cancer ([place]) 04/2009  . Hyperlipidemia   . COPD (chronic obstructive pulmonary disease) ([place])     URI 9/12  . DM type 2 (diabetes mellitus, type 2) ([place])   . PNA (pneumonia) 03/2010  . Glaucoma   . Hard of hearing   . Myocardial infarction ([place])   . Gout   . Adenomatous polyps   . Diverticulosis     left side   . H/O mitral valve prolapse   . Sigmoid diverticulitis     perforated    Current Outpatient Prescriptions  Medication Sig Dispense Refill  . carvedilol (COREG) 12.5 MG tablet Take 1 tablet (12.5 mg total) by mouth 2 (two) times daily with a meal. 180 tablet 3  . Cholecalciferol (VITAMIN D3) 1000 UNITS CAPS Take 1 capsule by mouth daily.     Marland Kitchen dextromethorphan-guaiFENesin (MUCINEX DM) 30-600 MG per 12 hr tablet Take 1 tablet by mouth every 12 (twelve) hours.    . digoxin (DIGOX) 0.125 MG tablet Take 1 tablet by mouth daily 90 tablet 3  . furosemide (LASIX) 20 MG tablet Take 1 tablet (20 mg total) by mouth 2 (two) times daily as needed. 180 tablet 3  . latanoprost (XALATAN) 0.005 % ophthalmic solution Place 1 drop into both eyes at bedtime.     Marland Kitchen losartan (COZAAR) 100 MG tablet Take one-half tablet by mouth daily 45 tablet 3  . metFORMIN (GLUCOPHAGE) 500 MG tablet Take 1,000 mg by mouth 2 (two) times daily with a meal.     . Multiple Vitamins-Minerals (PRESERVISION/LUTEIN PO) Apply to eye at bedtime.    . simvastatin (ZOCOR) 40 MG tablet Take 40 mg by mouth at bedtime.     Marland Kitchen SPIRIVA RESPIMAT 2.5 MCG/ACT AERS Use 2 puffs daily 12 g 1  . Tamsulosin HCl (FLOMAX) 0.4 MG CAPS Take 0.4 mg by mouth daily.     Marland Kitchen warfarin (COUMADIN) 2.5 MG tablet Take as directed by Coumadin Clinic 120 tablet 1    No current facility-administered medications for this encounter.    Filed Vitals:   11/20/15 1016  BP: 98/52  Pulse: 74  Weight: 164 lb 8 oz (74.617 kg)  SpO2: 96%    PHYSICAL EXAM:  General: Elderly. Chronically ill appearing. No resp difficulty HEENT: normal Neck: supple. JVP 8-9 Carotids 1+ bilaterally; no bruits. No lymphadenopathy or thryomegaly appreciated. Cor: PMI normal IRR IRR. 2/6 AS with reduced s2 mechanical s1  Lungs: decreased throughout bibasilar crackles Abdomen: soft, nontender, nondistended. No hepatosplenomegaly. N+ bruit Good bowel sounds. +colostomy LLQ Extremities: no cyanosis, clubbing, rash, 1+ edema Neuro: alert & orientedx3,  cranial nerves grossly intact. Moves all 4 extremities w/o difficulty. Affect pleasant.   ASSESSMENT & PLAN: 1. Acute on chronic systolic HF 2. Probable severe low gradient AS 3. Severe COPD 4. CAD s/p CABG 5. S/p mechanical MV replacement 6. Acute on chronic renal failure, stage III-IV 7. DM2 8 Chronic AF  I have reviewed his chart in detail and also reviewed his echo images personally. I suspect he has end-stage aortic stenosis with worsening LV dysfunction and low output. Just mild volume overload. Options at this point are very limited. Will admit for IV diuresis. Will likely need R/L heart cath (no coronary angio) with dobutamine challenge to assess hemodynamics and severity of AS. Will review with Dr. Burt Knack to see if he may be candidate for BAV. Not good VAD candidate given 2 previous sternotomies and severe COPD. I discussed this at length with him and his family. I worry prognosis is probably poor.  Latonia Conrow,MD 11:40 AM        As above. Admitting to [place] from Clinic.  Will order IV lasix 80 mg daily.  Legrand Como [address]" [place], PA-C 11/20/2015 12:07 PM   Agree with IV lasix. Will d/w Dr. Burt Knack and need to decide on timing of dobutamine initiation.   Vaun Hyndman,MD 4:46  PM

## 2015-11-20 NOTE — Progress Notes (Signed)
Pt and pts  family notified of positive MRSA swab. MRSA information sheet given and explained to family. Proper gowning and gloving and hand hygiene explained.

## 2015-11-20 NOTE — Progress Notes (Signed)
ANTICOAGULATION CONSULT NOTE - Initial Consult  Pharmacy Consult for heparin  Indication: atrial fibrillation, mechanical mitral valve  Patient Measurements: Height: 5\' 6"  (167.6 cm) Weight: 157 lb 6.5 oz (71.4 kg) IBW/kg (Calculated) : 63.8  Vital Signs: Temp: 98.1 F (36.7 C) (11/29 1559) Temp Source: Oral (11/29 1559) BP: 135/84 mmHg (11/29 1700) Pulse Rate: 91 (11/29 1700)  Labs:  Recent Labs  11/20/15 1705  LABPROT 32.4*  INR 3.23*    Assessment: 72 yom with acute on chronic systolic HF being admitted for diuresis. Pt on Coumadin PTA for history of chronic atrial fibrillation and mitral valve replacement with prosthetic valve in 2007. Per HF team plan to hold warfarin and start heparin when INR ~ 2.5  Coumadin dose PTA: 2.5 mg daily except 5 mg on Wednesdays and Saturdays (INR goal 2.5-3.5). Patient takes Coumadin at home in the evening, last dose was on 11/28 pm.   Goal of Therapy:  Heparin level 0.3-0.7 units/ml Monitor platelets by anticoagulation protocol: Yes   Plan:  1. INR currently therapeutic (3.2); plan to start heparin once INR ~ 2.5 2. Will draw INR in am  Vincenza Hews, PharmD, BCPS 11/20/2015, 6:02 PM Pager: [phone]

## 2015-11-20 NOTE — Care Management Note (Signed)
Case Management Note  Patient Details  Name: [name] MRN: [id number] Date of Birth: 1941/04/17  Subjective/Objective:      Adm w heart failure              Action/Plan: lives w wife  Expected Discharge Date:                  Expected Discharge Plan:     In-House Referral:     Discharge planning Services     Post Acute Care Choice:    Choice offered to:     DME Arranged:    DME Agency:     HH Arranged:    [place] Agency:     Status of Service:     Medicare Important Message Given:    Date Medicare IM Given:    Medicare IM give by:    Date Additional Medicare IM Given:    Additional Medicare Important Message give by:     If discussed at [place] of Stay Meetings, dates discussed:    Additional Comments: ur review done  Lacretia Leigh, RN 11/20/2015, 1:55 PM

## 2015-11-20 NOTE — Progress Notes (Signed)
ANTICOAGULATION CONSULT NOTE - Initial Consult  Pharmacy Consult for Coumadin Indication: atrial fibrillation, mechanical mitral valve  Allergies  Allergen Reactions  . Acetaminophen-Codeine     REACTION: hives  . Altace [Ramipril]     Other reaction(s): Weal  . Codeine     Other reaction(s): Other (qualifier value) REACTION: hives  . Other     Other reaction(s): Fever  . Sulfonamide Derivatives     REACTION: hives  . Sulfa Antibiotics Rash    Other reaction(s): Weal    Patient Measurements: Height: 5\' 6"  (167.6 cm) Weight: 157 lb 6.5 oz (71.4 kg) IBW/kg (Calculated) : 63.8   Vital Signs: BP: 124/90 mmHg (11/29 1300) Pulse Rate: 74 (11/29 1016)  Labs: No results for input(s): HGB, HCT, PLT, APTT, LABPROT, INR, HEPARINUNFRC, CREATININE, CKTOTAL, CKMB, TROPONINI in the last 72 hours.  Estimated Creatinine Clearance: 37.6 mL/min (by C-G formula based on Cr of 1.51).   Medical History: Past Medical History  Diagnosis Date  . Chronic atrial fibrillation (HCC)     a. warfarin; b. CHADSVASc 6 (CHF, HTN, age x 2, DM, vascular disease) giving him an estimated annual stroke risk of 9.8%  . Hypertension   . Hyperkalemia   . Chronic systolic CHF (congestive heart failure) ([place])     a. echo 10/2014: EF 30-35%, AK of inf & post myocardium, HK of apical & lat wall, ant & antsept wall best preserved, mitral: mechanical prosthesis was present & well seated, LA moderately dilated, RV systolic fxn mildly reduced, PASP nl  . Coronary artery disease     Cath 2005- patent LIMA-LAD, SVG-OM grafts, moderate MR  . Prostate cancer ([place]) 04/2009  . Hyperlipidemia   . COPD (chronic obstructive pulmonary disease) ([place])     URI 9/12  . DM type 2 (diabetes mellitus, type 2) ([place])   . PNA (pneumonia) 03/2010  . Glaucoma   . Hard of hearing   . Myocardial infarction ([place])   . Gout   . Adenomatous polyps   . Diverticulosis     left side   . H/O mitral valve prolapse   . Sigmoid  diverticulitis     perforated    Medications:  Scheduled:  . carvedilol  12.5 mg Oral BID WC  . dextromethorphan-guaiFENesin  1 tablet Oral Q12H  . [START ON 11/21/2015] digoxin  0.125 mg Oral Daily  . furosemide  80 mg Intravenous Daily  . latanoprost  1 drop Both Eyes QHS  . [START ON 11/21/2015] losartan  50 mg Oral Daily  . metFORMIN  1,000 mg Oral BID WC  . simvastatin  40 mg Oral QHS  . sodium chloride  3 mL Intravenous Q12H  . [START ON 11/21/2015] tamsulosin  0.4 mg Oral Daily  . [START ON 11/21/2015] Tiotropium Bromide Monohydrate  5 mcg Inhalation Daily  . Vitamin D3  1 capsule Oral Daily    Assessment: 26 yom with acute on chronic systolic HF being admitted for diuresis. Pt on Coumadin PTA for history of chronic atrial fibrillation and mitral valve replacement with prosthetic valve in 2007.   Coumadin dose PTA: 2.5 mg daily except 5 mg on Wednesdays and Saturdays (INR goal 2.5-3.5). Patient takes Coumadin at home in the evening, last dose was yesterday.   Goal of Therapy:  INR goal 2.5-3.5 Monitor platelets by anticoagulation protocol: Yes   Plan:  - Will check INR and CBC, dose Coumadin based on results - Will plan on home dose as long as INR is at goal -  Daily INR  Alfonzo Beers 11/20/2015,3:09 PM

## 2015-11-20 NOTE — Progress Notes (Signed)
ADVANCED HF CLINIC NOTE  PCP: Dr. Kary Kos Primary Cardiologist: Rockey Situ Pulmonary: [place] [facility]  HPI:  [name] is a 74 year old gentleman with CAD s/p CABG 1989, PCI of the RCA in 2002, severe COPD, CKD 3, s/p mechanical mitral valve replacement in 2007, chronic atrial fibrillation, diabetes, hypertension, hyperlipidemia, PNA in 03/2010, h/o diverticular disease s/p colon resection with ostomy referred by Dr. Rockey Situ for worsening HF.   Has had worsening HF symptoms since July. Much worse over last few weeks. Now dyspneic with any exertion. + orthopnea. No PND. Sleeps with O2. Has been increasing lasix intermittently to 60 daily. Now on 40 daily. Weight stable but appetite poor. + ankle edema. Occasional R-sided CP.   Echos reviewed personally in clinic: Echo 11/15: EF 30-25%  Echo 11/19/15 with worsening EF 20-25% with at least moderate AS (concern for low -gradient severe AS), moderate RV dysfunction, MV ok RVSP 38mmHG   Prior EKG shows atrial fibrillation with ventricular rate 91 bpm, right bundle branch block   Review of Systems: [y] = yes, [ ]  = no    General: Weight gain [] ; Weight loss [ ] ; Anorexia [ ] ; Fatigue [y]; Fever [ ] ; Chills [ ] ; Weakness Blue.Reese ]   Cardiac: Chest pain/pressure [y]; Resting SOB [y]; Exertional SOB [y]; Orthopnea [y]; Pedal Edema [ y]; Palpitations [ ] ; Syncope [ ] ; Presyncope [ ] ; Paroxysmal nocturnal dyspnea[ ]    Pulmonary: Cough [y]; Wheezing[ ] ; Hemoptysis[ ] ; Sputum [] ; Snoring [ ]    GI: Vomiting[ ] ; Dysphagia[ ] ; Melena[ ] ; Hematochezia [ ] ; Heartburn[ ] ; Abdominal pain Blue.Reese ]; Constipation [ ] ; Diarrhea [ ] ; BRBPR [ ]    GU: Hematuria[ ] ; Dysuria [ ] ; Nocturia[ ]   Vascular: Pain in legs with walking [ ] ; Pain in feet with lying flat [ ] ; Non-healing sores [ ] ; Stroke [ ] ; TIA [ ] ; Slurred speech [ ] ;   Neuro: Headaches[ ] ; Vertigo[ ] ; Seizures[ ] ; Paresthesias[ ] ;Blurred vision [ ] ; Diplopia [ ] ; Vision changes [ ]    Ortho/Skin: Arthritis Blue.Reese ];  Joint pain [ y]; Muscle pain [ ] ; Joint swelling [ ] ; Back Pain [ ] ; Rash [ ]    Psych: Depression[ ] ; Anxiety[ ]    Heme: Bleeding problems [ ] ; Clotting disorders [ ] ; Anemia [ ]    Endocrine: Diabetes [y]; Thyroid dysfunction[ ]   SH:  Social History   Social History  . Marital Status: Married    Spouse Name: N/A  . Number of Children: 3  . Years of Education: N/A   Occupational History  . retired Airline pilot    Social History Main Topics  . Smoking status: Former Smoker -- 4.00 packs/day for 23 years    Types: Cigarettes    Quit date: 12/23/1987  . Smokeless tobacco: Never Used  . Alcohol Use: No  . Drug Use: No  . Sexual Activity: Not on file   Other Topics Concern  . Not on file   Social History Narrative   Lives in [place], [place] with wife.     FH:  Family History  Problem Relation Age of Onset  . Coronary artery disease Mother   . Heart attack Mother     x10  . Heart disease Mother   . Stroke Father   . Heart disease Father   . Heart attack Brother     with bypass   . Heart attack Sister     with bypass   . Cancer Sister     bone  . Pancreatic cancer Brother  pancreatic  . Heart disease Maternal Grandmother   . Heart disease Maternal Grandfather   . Leukemia Sister   . Cancer Maternal Aunt     ? type    Past Medical History  Diagnosis Date  . Chronic atrial fibrillation (HCC)     a. warfarin; b. CHADSVASc 6 (CHF, HTN, age x 2, DM, vascular disease) giving him an estimated annual stroke risk of 9.8%  . Hypertension   . Hyperkalemia   . Chronic systolic CHF (congestive heart failure) ([place])     a. echo 10/2014: EF 30-35%, AK of inf & post myocardium, HK of apical & lat wall, ant & antsept wall best preserved, mitral: mechanical prosthesis was present & well seated, LA moderately dilated, RV systolic fxn mildly reduced, PASP nl  . Coronary artery disease     Cath 2005- patent LIMA-LAD, SVG-OM grafts, moderate MR  . Prostate cancer ([place])  04/2009  . Hyperlipidemia   . COPD (chronic obstructive pulmonary disease) ([place])     URI 9/12  . DM type 2 (diabetes mellitus, type 2) ([place])   . PNA (pneumonia) 03/2010  . Glaucoma   . Hard of hearing   . Myocardial infarction ([place])   . Gout   . Adenomatous polyps   . Diverticulosis     left side   . H/O mitral valve prolapse   . Sigmoid diverticulitis     perforated    Current Outpatient Prescriptions  Medication Sig Dispense Refill  . carvedilol (COREG) 12.5 MG tablet Take 1 tablet (12.5 mg  total) by mouth 2 (two)  times daily with a meal. 180 tablet 3  . Cholecalciferol (VITAMIN D3) 1000 UNITS CAPS Take 1 capsule by mouth daily.      Marland Kitchen dextromethorphan-guaiFENesin (MUCINEX DM) 30-600 MG per 12 hr tablet Take 1 tablet by mouth every 12 (twelve) hours.    . digoxin (DIGOX) 0.125 MG tablet Take 1 tablet by mouth  daily 90 tablet 3  . furosemide (LASIX) 20 MG tablet Take 1 tablet (20 mg total) by mouth 2 (two) times daily as needed. 180 tablet 3  . latanoprost (XALATAN) 0.005 % ophthalmic solution Place 1 drop into both eyes at bedtime.     Marland Kitchen losartan (COZAAR) 100 MG tablet Take one-half tablet by  mouth daily 45 tablet 3  . metFORMIN (GLUCOPHAGE) 500 MG tablet Take 1,000 mg by mouth 2 (two) times daily with a meal.     . Multiple Vitamins-Minerals (PRESERVISION/LUTEIN PO) Apply to eye at bedtime.    . simvastatin (ZOCOR) 40 MG tablet Take 40 mg by mouth at bedtime.     Marland Kitchen SPIRIVA RESPIMAT 2.5 MCG/ACT AERS Use 2 puffs daily 12 g 1  . Tamsulosin HCl (FLOMAX) 0.4 MG CAPS Take 0.4 mg by mouth daily.     Marland Kitchen warfarin (COUMADIN) 2.5 MG tablet Take as directed by  Coumadin Clinic 120 tablet 1   No current facility-administered medications for this encounter.    Filed Vitals:   11/20/15 1016  BP: 98/52  Pulse: 74  Weight: 164 lb 8 oz (74.617 kg)  SpO2: 96%    PHYSICAL EXAM:  General:  Elderly. Chronically ill  appearing. No resp difficulty HEENT: normal Neck: supple. JVP 8-9  Carotids 1+ bilaterally; no bruits. No lymphadenopathy or thryomegaly appreciated. Cor: PMI normal IRR IRR. 2/6 AS with reduced s2 mechanical s1  Lungs: decreased throughout bibasilar crackles Abdomen: soft, nontender, nondistended. No hepatosplenomegaly. N+ bruit Good bowel sounds. +colostomy LLQ Extremities: no cyanosis,  clubbing, rash, 1+ edema Neuro: alert & orientedx3, cranial nerves grossly intact. Moves all 4 extremities w/o difficulty. Affect pleasant.   ASSESSMENT & PLAN: 1. Acute on chronic systolic HF 2. Probable severe low gradient AS 3. Severe COPD 4. CAD s/p CABG 5. S/p mechanical MV replacement 6. Acute on chronic renal failure, stage III-IV 7. DM2 8  Chronic AF  I have reviewed his chart in detail and also reviewed his echo images personally. I suspect he has end-stage aortic stenosis with worsening LV dysfunction and low output. Just mild volume overload. Options at this point are very limited. Will admit for IV diuresis. Will likely need R/L heart cath (no coronary angio) with dobutamine challenge to assess hemodynamics and severity of AS. Will review with Dr. Burt Knack to see if he may be candidate for BAV. Not good VAD candidate given 2 previous sternotomies and severe COPD.  I discussed this at length with him and his family. I worry prognosis is probably poor.  Bensimhon, [name],MD 11:40 AM

## 2015-11-20 NOTE — Telephone Encounter (Signed)
lmomtcb x 2  

## 2015-11-20 NOTE — Progress Notes (Signed)
  Case reviewed with Dr. Burt Knack.  Agrees with concern for end-stage AS and related cardiomyopathy. Suspect BAV may not be very helpful.   Will start dobutamine and repeat echo. Will also likely need RHC.   Stop warfarin. Start heparin when INR < 2.5 (mechanical MV)  Hold metformin with low output HF. Start SSI.  Discussed with patient and family.   Bensimhon, [name],MD 5:25 PM

## 2015-11-21 ENCOUNTER — Inpatient Hospital Stay (HOSPITAL_COMMUNITY): Payer: Medicare Other

## 2015-11-21 DIAGNOSIS — N178 Other acute kidney failure: Secondary | ICD-10-CM

## 2015-11-21 DIAGNOSIS — I482 Chronic atrial fibrillation: Secondary | ICD-10-CM

## 2015-11-21 DIAGNOSIS — I350 Nonrheumatic aortic (valve) stenosis: Secondary | ICD-10-CM

## 2015-11-21 LAB — PROTIME-INR
INR: 3.11 — AB (ref 0.00–1.49)
Prothrombin Time: 31.4 seconds — ABNORMAL HIGH (ref 11.6–15.2)

## 2015-11-21 LAB — GLUCOSE, CAPILLARY
GLUCOSE-CAPILLARY: 100 mg/dL — AB (ref 65–99)
Glucose-Capillary: 179 mg/dL — ABNORMAL HIGH (ref 65–99)
Glucose-Capillary: 153 mg/dL — ABNORMAL HIGH (ref 65–99)

## 2015-11-21 LAB — CARBOXYHEMOGLOBIN
Carboxyhemoglobin: 2 % — ABNORMAL HIGH (ref 0.5–1.5)
Methemoglobin: 0.7 % (ref 0.0–1.5)
O2 SAT: 89.5 %
TOTAL HEMOGLOBIN: 11.4 g/dL — AB (ref 13.5–18.0)

## 2015-11-21 LAB — BASIC METABOLIC PANEL
Anion gap: 9 (ref 5–15)
BUN: 26 mg/dL — AB (ref 6–20)
CHLORIDE: 101 mmol/L (ref 101–111)
CO2: 32 mmol/L (ref 22–32)
CREATININE: 1.22 mg/dL (ref 0.61–1.24)
Calcium: 9.2 mg/dL (ref 8.9–10.3)
GFR calc Af Amer: >60 mL/min (ref >60)
GFR calc non Af Amer: 56 mL/min — ABNORMAL LOW (ref >60)
Glucose, Bld: 94 mg/dL (ref 65–99)
POTASSIUM: 3.6 mmol/L (ref 3.5–5.1)
Sodium: 142 mmol/L (ref 135–145)

## 2015-11-21 LAB — DIGOXIN LEVEL: Digoxin Level: 1 ng/mL (ref 0.8–2.0)

## 2015-11-21 MED ORDER — SODIUM CHLORIDE 0.9 % IJ SOLN: 10.0000 mL | INTRAMUSCULAR | Status: DC | PRN

## 2015-11-21 MED ORDER — SODIUM CHLORIDE 0.9 % IV SOLN
INTRAVENOUS | Status: DC
  Administered 2015-11-21: 21:00:00 via INTRAVENOUS

## 2015-11-21 MED ORDER — SODIUM CHLORIDE 0.9 % IJ SOLN
3.0000 mL | Freq: Two times a day (BID) | INTRAMUSCULAR | Status: DC
  Administered 2015-11-23: 3 mL via INTRAVENOUS

## 2015-11-21 MED ORDER — SODIUM CHLORIDE 0.9 % IJ SOLN: 3.0000 mL | INTRAMUSCULAR | Status: DC | PRN

## 2015-11-21 MED ORDER — SODIUM CHLORIDE 0.9 % IJ SOLN
10.0000 mL | Freq: Two times a day (BID) | INTRAMUSCULAR | Status: DC
  Administered 2015-11-21 – 2015-11-22 (×2): 10 mL
  Administered 2015-11-22: 20 mL
  Administered 2015-11-23 – 2015-11-24 (×3): 10 mL

## 2015-11-21 MED ORDER — SODIUM CHLORIDE 0.9 % IV SOLN: 250.0000 mL | INTRAVENOUS | Status: DC | PRN

## 2015-11-21 MED ORDER — DM-GUAIFENESIN ER 30-600 MG PO TB12
1.0000 | ORAL_TABLET | Freq: Two times a day (BID) | ORAL | Status: DC
  Administered 2015-11-21 – 2015-11-24 (×7): 1 via ORAL
  Filled 2015-11-21 (×7): qty 1

## 2015-11-21 MED ORDER — ASPIRIN 81 MG PO CHEW
81.0000 mg | CHEWABLE_TABLET | ORAL | Status: AC
  Administered 2015-11-22: 81 mg via ORAL
  Filled 2015-11-21: qty 1

## 2015-11-21 NOTE — Progress Notes (Signed)
Echocardiogram 2D Echocardiogram has been performed.  Joelene Millin 11/21/2015, 9:22 AM

## 2015-11-21 NOTE — Consult Note (Addendum)
WOC ostomy consult note Stoma type/location:  Consult requested for ostomy assistance.  Pt has ostomy for several years to LLQ and is independent with emptying when at home.  They did not bring any supplies into the hospital and state pouch was changed yesterday. Stomal assessment/size: Stoma red and viable when visualized through pouch, which is intact with good seal. Output: Mod amt brown semi-formed stool in pouch  Ostomy pouching: 2pc. They use 2 piece system with barrier ring. Supplies ordered to room for patient and wife use PRN. Wife at bedside states she changes the pouch and denies need for assistance. Please re-consult if further assistance is needed.  Thank-you,  Julien Girt MSN, [place], [place], [place], [place]

## 2015-11-21 NOTE — Progress Notes (Signed)
Peripherally Inserted Central Catheter/Midline Placement  The IV Nurse has discussed with the patient and/or persons authorized to consent for the patient, the purpose of this procedure and the potential benefits and risks involved with this procedure.  The benefits include less needle sticks, lab draws from the catheter and patient may be discharged home with the catheter.  Risks include, but not limited to, infection, bleeding, blood clot (thrombus formation), and puncture of an artery; nerve damage and irregular heat beat.  Alternatives to this procedure were also discussed.  PICC/Midline Placement Documentation  PICC Double Lumen [id number] PICC Right Basilic 40 cm 0 cm (Active)  Indication for Insertion or Continuance of Line Vasoactive infusions 11/21/2015 11:00 AM  Exposed Catheter (cm) 0 cm 11/21/2015 11:00 AM  Dressing Change Due 11/28/15 11/21/2015 11:00 AM       Jule Economy Horton 11/21/2015, 11:52 AM

## 2015-11-21 NOTE — Progress Notes (Addendum)
Advanced Heart Failure Rounding Note   Subjective:    [name] is a 74 year old gentleman with CAD s/p CABG 1989, PCI of the RCA in 2002, severe COPD, CKD 3, s/p mechanical mitral valve replacement in 2007, chronic atrial fibrillation, diabetes, hypertension, hyperlipidemia, PNA in 03/2010, h/o diverticular disease s/p colon resection with ostomy admitted on 11/29 for worsening HF and renal failure in the setting of likely low-gradient severe AS and EF 25%  Dobutamine and IV lasix started. Excellent diuresis. Out 3L. Weight recorded as down 9 pounds. Breathing better. No orthopnea or PND.     Objective:   Weight Range:  Vital Signs:   Temp:  [97.9 F (36.6 C)-98.3 F (36.8 C)] 98.1 F (36.7 C) (11/30 1600) Pulse Rate:  [40-103] 72 (11/30 1600) Resp:  [14-31] 18 (11/30 1600) BP: (104-140)/(47-89) 107/57 mmHg (11/30 1600) SpO2:  [92 %-100 %] 97 % (11/30 1600) Weight:  [67.4 kg (148 lb 9.4 oz)] 67.4 kg (148 lb 9.4 oz) (11/30 0500) Last BM Date: 11/21/15  Weight change: Filed Weights   11/20/15 1400 11/21/15 0500  Weight: 71.4 kg (157 lb 6.5 oz) 67.4 kg (148 lb 9.4 oz)    Intake/Output:   Intake/Output Summary (Last 24 hours) at 11/21/15 1730 Last data filed at 11/21/15 1606  Gross per 24 hour  Intake  824.4 ml  Output   5575 ml  Net -4750.6 ml     Physical Exam: General: Elderly. Chronically ill appearing. No resp difficulty HEENT: normal Neck: supple. JVP 8-9 Carotids 1+ bilaterally; no bruits. No lymphadenopathy or thryomegaly appreciated. Cor: PMI normal IRR IRR. 2/6 AS with reduced s2 mechanical s1  Lungs: decreased throughout otherwise clear Abdomen: soft, nontender, nondistended. No hepatosplenomegaly. N+ bruit Good bowel sounds. +colostomy LLQ Extremities: no cyanosis, clubbing, rash, trace edema Neuro: alert & orientedx3, cranial nerves grossly intact. Moves all 4 extremities w/o difficulty. Affect pleasant.  Telemetry: AF  Labs: Basic Metabolic  Panel:  Recent Labs Lab 11/21/15 0320  NA 142  K 3.6  CL 101  CO2 32  GLUCOSE 94  BUN 26*  CREATININE 1.22  CALCIUM 9.2    Liver Function Tests: No results for input(s): AST, ALT, ALKPHOS, BILITOT, PROT, ALBUMIN in the last 168 hours. No results for input(s): LIPASE, AMYLASE in the last 168 hours. No results for input(s): AMMONIA in the last 168 hours.  CBC:  Recent Labs Lab 11/20/15 1705  WBC 3.6*  HGB 11.4*  HCT 36.1*  MCV 92.8  PLT 91*    Cardiac Enzymes: No results for input(s): CKTOTAL, CKMB, CKMBINDEX, TROPONINI in the last 168 hours.  BNP: BNP (last 3 results) No results for input(s): BNP in the last 8760 hours.  ProBNP (last 3 results)  Recent Labs  11/13/15 1559  PROBNP 1778.0*      Other results:  Imaging: Dg Chest Port 1 View  11/21/2015  CLINICAL DATA:  PICC line placement. EXAM: PORTABLE CHEST 1 VIEW COMPARISON:  November 13, 2015 FINDINGS: Stable cardiomegaly. Status post coronary artery bypass graft. No pneumothorax is noted. Interval placement of right-sided PICC line with distal tip overlying expected position of the SVC. Mild right basilar subsegmental atelectasis is noted. Left basilar opacity is noted concerning for atelectasis or edema with associated pleural effusion. Bony thorax is unremarkable. IMPRESSION: Mild left basilar opacity concerning for atelectasis or edema with associated pleural effusion. Mild right basilar subsegmental atelectasis is noted. Interval placement of right-sided PICC line with distal tip overlying expected position of the SVC.  Electronically Signed   By: Marijo Conception, M.D.   On: 11/21/2015 13:16      Medications:     Scheduled Medications: . antiseptic oral rinse  7 mL Mouth Rinse BID  . Chlorhexidine Gluconate Cloth  6 each Topical Q0600  . cholecalciferol  1,000 Units Oral Daily  . dextromethorphan-guaiFENesin  1 tablet Oral Q12H  . digoxin  0.125 mg Oral Daily  . furosemide  80 mg Intravenous  BID  . insulin aspart  0-15 Units Subcutaneous TID WC  . latanoprost  1 drop Both Eyes QHS  . losartan  25 mg Oral Daily  . mupirocin ointment  1 application Nasal BID  . simvastatin  40 mg Oral QHS  . sodium chloride  10-40 mL Intracatheter Q12H  . sodium chloride  3 mL Intravenous Q12H  . tamsulosin  0.4 mg Oral Daily     Infusions: . DOBUTamine 3 mcg/kg/min (11/21/15 0800)     PRN Medications:  sodium chloride, acetaminophen, ondansetron (ZOFRAN) IV, sodium chloride, sodium chloride   Assessment:   1. Acute on chronic systolic HF with biventricular failure 2. Probable severe low gradient AS 3. Severe COPD 4. CAD s/p CABG 1989 5. S/p mechanical MV replacement via lateral thoracotomy at [facility] (Dr. Evelina Dun) in 2007 6. Acute on chronic renal failure, stage III-IV 7. DM2 8 Chronic AF  Plan/Discussion:     He has responded well to IV lasix and dobutamine. Volume status and renal function improved. Repeat echo done today on low-dose dobutamine (61mcg/kg/min) does not show any increase in AoV gradient but visually AS looks severe. Will likely need higher dose dobutamine challenge. PICC line placed today will check CVP and co-ox. Continue dobutamine and IV diuresis. Will plan RHC tomorrow. I have discussed the case with Dr. Burt Knack and we are both concerned about end-stage severe AS with severe LV dysfunction and I am not sure he will have any durable options. Dr. Copper will see him tomorrow for TAVR consult. Coumadin being held. Start heparin when INR < 2.5 (mechanical MV). Discussed with family at bedside.  Metformin on hold due to low-output HF.   Length of Stay: 1   Georgetta Crafton  MD  11/21/2015, 5:30 PM  Advanced Heart Failure Team Pager [phone] (M-F; [place])  Please contact [facility] for night-coverage after hours (4p -7a ) and weekends on [email or web address]

## 2015-11-22 ENCOUNTER — Encounter (HOSPITAL_COMMUNITY)
Admission: AD | Disposition: A | Discharge status: Other Acute Inpatient Hospital | Payer: Self-pay | Source: Ambulatory Visit | Attending: Internal Medicine

## 2015-11-22 ENCOUNTER — Ambulatory Visit (HOSPITAL_COMMUNITY): Payer: Medicare Other

## 2015-11-22 ENCOUNTER — Encounter (HOSPITAL_COMMUNITY): Payer: Self-pay

## 2015-11-22 DIAGNOSIS — I350 Nonrheumatic aortic (valve) stenosis: Secondary | ICD-10-CM

## 2015-11-22 DIAGNOSIS — E876 Hypokalemia: Secondary | ICD-10-CM

## 2015-11-22 LAB — BASIC METABOLIC PANEL
Anion gap: 8 (ref 5–15)
BUN: 18 mg/dL (ref 6–20)
CALCIUM: 8.9 mg/dL (ref 8.9–10.3)
CHLORIDE: 96 mmol/L — AB (ref 101–111)
CO2: 38 mmol/L — AB (ref 22–32)
CREATININE: 1.1 mg/dL (ref 0.61–1.24)
GFR calc Af Amer: >60 mL/min (ref >60)
GFR calc non Af Amer: >60 mL/min (ref >60)
GLUCOSE: 109 mg/dL — AB (ref 65–99)
Potassium: 3.1 mmol/L — ABNORMAL LOW (ref 3.5–5.1)
Sodium: 142 mmol/L (ref 135–145)

## 2015-11-22 LAB — ECHOCARDIOGRAM STRESS TEST
CSEPED: 0 min
CSEPEW: 1 METS
CSEPHR: 104 %
CSEPPHR: 151 {beats}/min
Exercise duration (sec): 0 s
MPHR: 144 {beats}/min
RPE: 0
Rest HR: 96 {beats}/min

## 2015-11-22 LAB — GLUCOSE, CAPILLARY
GLUCOSE-CAPILLARY: 117 mg/dL — AB (ref 65–99)
GLUCOSE-CAPILLARY: 115 mg/dL — AB (ref 65–99)
GLUCOSE-CAPILLARY: 206 mg/dL — AB (ref 65–99)
Glucose-Capillary: 192 mg/dL — ABNORMAL HIGH (ref 65–99)
Glucose-Capillary: 151 mg/dL — ABNORMAL HIGH (ref 65–99)

## 2015-11-22 LAB — CBC
HCT: 35.5 % — ABNORMAL LOW (ref 39.0–52.0)
HEMOGLOBIN: 11.4 g/dL — AB (ref 13.0–17.0)
MCH: 29.5 pg (ref 26.0–34.0)
MCHC: 32.1 g/dL (ref 30.0–36.0)
MCV: 91.7 fL (ref 78.0–100.0)
PLATELETS: 88 10*3/uL — AB (ref 150–400)
RBC: 3.87 MIL/uL — AB (ref 4.22–5.81)
RDW: 16.1 % — ABNORMAL HIGH (ref 11.5–15.5)
WBC: 4.6 10*3/uL (ref 4.0–10.5)

## 2015-11-22 LAB — CARBOXYHEMOGLOBIN
Carboxyhemoglobin: 1.7 % — ABNORMAL HIGH (ref 0.5–1.5)
METHEMOGLOBIN: 0.7 % (ref 0.0–1.5)
O2 Saturation: 70.8 %
TOTAL HEMOGLOBIN: 11.4 g/dL — AB (ref 13.5–18.0)

## 2015-11-22 LAB — PROTIME-INR
INR: 2.47 — ABNORMAL HIGH (ref 0.00–1.49)
Prothrombin Time: 26.5 seconds — ABNORMAL HIGH (ref 11.6–15.2)

## 2015-11-22 LAB — HEPARIN LEVEL (UNFRACTIONATED): Heparin Unfractionated: 0.27 IU/mL — ABNORMAL LOW (ref 0.30–0.70)

## 2015-11-22 LAB — MAGNESIUM: MAGNESIUM: 1.3 mg/dL — AB (ref 1.7–2.4)

## 2015-11-22 SURGERY — RIGHT HEART CATH
Anesthesia: LOCAL

## 2015-11-22 MED ORDER — MAGNESIUM SULFATE 4 GM/100ML IV SOLN
4.0000 g | Freq: Once | INTRAVENOUS | Status: AC
  Administered 2015-11-22: 4 g via INTRAVENOUS
  Filled 2015-11-22: qty 100

## 2015-11-22 MED ORDER — "THROMBI-PAD 3""X3"" EX PADS"
1.0000 | MEDICATED_PAD | Freq: Once | CUTANEOUS | Status: AC
  Administered 2015-11-22: 1 via TOPICAL
  Filled 2015-11-22: qty 1

## 2015-11-22 MED ORDER — POTASSIUM CHLORIDE CRYS ER 20 MEQ PO TBCR
40.0000 meq | EXTENDED_RELEASE_TABLET | ORAL | Status: AC
  Administered 2015-11-22 (×2): 40 meq via ORAL
  Filled 2015-11-22 (×2): qty 2

## 2015-11-22 MED ORDER — HEPARIN (PORCINE) IN NACL 100-0.45 UNIT/ML-% IJ SOLN
1000.0000 [IU]/h | INTRAMUSCULAR | Status: DC
  Administered 2015-11-22: 1000 [IU]/h via INTRAVENOUS
  Filled 2015-11-22: qty 250

## 2015-11-22 MED ORDER — DOBUTAMINE INFUSION FOR EP/ECHO/NUC (1000 MCG/ML): 0.0000 ug/kg/min | INTRAVENOUS | Status: DC

## 2015-11-22 NOTE — OR Nursing (Signed)
Dobutamine stress test done without complications.  Patient denies chest pain or nausea

## 2015-11-22 NOTE — Progress Notes (Signed)
Echocardiogram Echocardiogram Pharmacologic Stress Test has been performed.  Tresa Res 11/22/2015, 3:55 PM

## 2015-11-22 NOTE — Telephone Encounter (Signed)
lmtcb for pt.  

## 2015-11-22 NOTE — Progress Notes (Signed)
ANTICOAGULATION CONSULT NOTE Pharmacy Consult for heparin  Indication: atrial fibrillation, mechanical mitral valve  Patient Measurements: Height: 5\' 6"  (167.6 cm) Weight: 146 lb 9.7 oz (66.5 kg) IBW/kg (Calculated) : 63.8  Vital Signs: Temp: 98.1 F (36.7 C) (12/01 0000) Temp Source: Oral (12/01 0000) BP: 123/65 mmHg (12/01 0300) Pulse Rate: 100 (12/01 0300)  Labs:  Recent Labs  11/20/15 1705 11/21/15 0320 11/22/15 0425  HGB 11.4*  --   --   HCT 36.1*  --   --   PLT 91*  --   --   LABPROT 32.4* 31.4* 26.5*  INR 3.23* 3.11* 2.47*  CREATININE  --  1.22  --     Assessment: 85 yom with h/o atrial fibrillation and mechanical MVR, Coumadin on hold, for heparin  Goal of Therapy:  Heparin level 0.3-0.7 units/ml Monitor platelets by anticoagulation protocol: Yes   Plan:  Stat heparin 1000 units/hr Check heparin level in 8 hours.   Phillis Knack, PharmD, BCPS

## 2015-11-22 NOTE — Consult Note (Signed)
CARDIOLOGY CONSULT NOTE  Patient ID: [name], MRN: [id number], DOB/AGE: 02-06-1941 74 y.o. Admit date: 11/20/2015 Date of Consult: 11/22/2015  Primary Physician: [place] Pink, MD Primary Cardiologist: Dr Rockey Situ Referring Physician: Dr Haroldine Laws  Chief Complaint: Shortness of breath  Reason for Consultation: Severe aortic stenosis  HPI: 74 yo male admitted with progressive symptoms of congestive heart failure and noted to have calcification and restriction of the aortic valve, concern for low-gradient severe aortic stenosis.   The patient reports progressive symptoms of shortness of breath, orthopnea, PND, and abdominal swelling over a period of 6 months, with marked progression of symptoms over the past 2 weeks. Predating his severe heart failure symptoms, he reports exertional chest discomfort relieved with rest. Now he primarily complains of shortness of breath. The patient has been treated with IV dobutamine and lasix over the past few days and he reports significant clinical improvement since he's been hospitalized.   He has a complex cardiac history, initially undergoing multivessel CABG in 1989, then undergoing a second cardiac surgery in 2007 with mechanical mitral valve replacement. He has not undergone repeat cardiac catheterization since his last cardiac surgery. He has chronic LV dysfunction, recently with severe LV dysfunction with LVEF 20-25%, moderate RV dysfunction, and severe pulmonary HTN.   The patient is married and lives in [place], [place]. He has been relatively active until the last 3-4 weeks, still drives a car and works in the yard. His 2 daughters are at the bedside today during my interview.  Medical History:  Past Medical History  Diagnosis Date  . Chronic atrial fibrillation (HCC)     a. warfarin; b. CHADSVASc 6 (CHF, HTN, age x 2, DM, vascular disease) giving him an estimated annual stroke risk of 9.8%  . Hypertension   . Hyperkalemia   . Chronic  systolic CHF (congestive heart failure) ([place])     a. echo 10/2014: EF 30-35%, AK of inf & post myocardium, HK of apical & lat wall, ant & antsept wall best preserved, mitral: mechanical prosthesis was present & well seated, LA moderately dilated, RV systolic fxn mildly reduced, PASP nl  . Coronary artery disease     Cath 2005- patent LIMA-LAD, SVG-OM grafts, moderate MR  . Prostate cancer ([place]) 04/2009  . Hyperlipidemia   . COPD (chronic obstructive pulmonary disease) ([place])     URI 9/12  . DM type 2 (diabetes mellitus, type 2) ([place])   . PNA (pneumonia) 03/2010  . Glaucoma   . Hard of hearing   . Myocardial infarction ([place])   . Gout   . Adenomatous polyps   . Diverticulosis     left side   . H/O mitral valve prolapse   . Sigmoid diverticulitis     perforated      Surgical History:  Past Surgical History  Procedure Laterality Date  . Coronary artery bypass graft  1989    LIMA-LAD, SVG-OM  . Mitral valve replacement  2007    Mechanical, [facility]  . Hernia repair    . External ear surgery      drum  . Colon resection  01/12/2012    sigmoid colectomy and colostomy for diverticulitis  . Cataract extraction    . Colonoscopy  11/23/2008     Home Meds: Prior to Admission medications   Medication Sig Start Date End Date Taking? Authorizing Provider  carvedilol (COREG) 12.5 MG tablet Take 1 tablet (12.5 mg  total) by mouth 2 (two)  times daily with  a meal. 11/19/15  Yes Minna Merritts, MD  Cholecalciferol (VITAMIN D3) 1000 UNITS CAPS Take 1 capsule by mouth daily.     Yes Historical Provider, MD  dextromethorphan-guaiFENesin (MUCINEX DM) 30-600 MG per 12 hr tablet Take 1 tablet by mouth every 12 (twelve) hours.   Yes Historical Provider, MD  digoxin (DIGOX) 0.125 MG tablet Take 1 tablet by mouth  daily 11/19/15  Yes Minna Merritts, MD  furosemide (LASIX) 20 MG tablet Take 1 tablet (20 mg total) by mouth 2 (two) times daily as needed. Patient taking differently: Take 20 mg  by mouth 2 (two) times daily as needed for fluid.  08/30/15  Yes Minna Merritts, MD  latanoprost (XALATAN) 0.005 % ophthalmic solution Place 1 drop into both eyes at bedtime.  02/14/15  Yes Historical Provider, MD  losartan (COZAAR) 100 MG tablet Take one-half tablet by  mouth daily 02/28/15  Yes Minna Merritts, MD  metFORMIN (GLUCOPHAGE) 500 MG tablet Take 1,000 mg by mouth 2 (two) times daily with a meal.  08/26/11  Yes Historical Provider, MD  Multiple Vitamins-Minerals (PRESERVISION/LUTEIN PO) Apply to eye at bedtime.   Yes Historical Provider, MD  simvastatin (ZOCOR) 40 MG tablet Take 40 mg by mouth at bedtime.    Yes Historical Provider, MD  Tamsulosin HCl (FLOMAX) 0.4 MG CAPS Take 0.4 mg by mouth daily.  07/30/11  Yes Historical Provider, MD  Tiotropium Bromide Monohydrate (SPIRIVA RESPIMAT) 2.5 MCG/ACT AERS Inhale 2 puffs into the lungs daily as needed. For shortness of breath   Yes Historical Provider, MD  warfarin (COUMADIN) 2.5 MG tablet Take 2.5-5 mg by mouth daily. Take two of the 2.5mg  tablets=5mg  on Wednesday and Saturday. Rest of the days take one 2.5mg . Per wife   Yes Historical Provider, MD  SPIRIVA RESPIMAT 2.5 MCG/ACT AERS Use 2 puffs daily Patient not taking: Reported on 11/20/2015 10/24/15   Juanito Doom, MD  warfarin (COUMADIN) 2.5 MG tablet Take as directed by  [facility] Patient not taking: Reported on 11/20/2015 08/14/15   Minna Merritts, MD    Inpatient Medications:  . antiseptic oral rinse  7 mL Mouth Rinse BID  . Chlorhexidine Gluconate Cloth  6 each Topical Q0600  . cholecalciferol  1,000 Units Oral Daily  . dextromethorphan-guaiFENesin  1 tablet Oral Q12H  . digoxin  0.125 mg Oral Daily  . insulin aspart  0-15 Units Subcutaneous TID WC  . latanoprost  1 drop Both Eyes QHS  . magnesium sulfate 1 - 4 g bolus IVPB  4 g Intravenous Once  . mupirocin ointment  1 application Nasal BID  . potassium chloride  40 mEq Oral Q4H  . simvastatin  40 mg Oral QHS  .  sodium chloride  10-40 mL Intracatheter Q12H  . sodium chloride  3 mL Intravenous Q12H  . sodium chloride  3 mL Intravenous Q12H  . tamsulosin  0.4 mg Oral Daily   . sodium chloride 10 mL/hr at 11/21/15 2115  . DOBUTamine 3 mcg/kg/min (11/21/15 0800)  . heparin 1,000 Units/hr (11/22/15 0545)    Allergies:  Allergies  Allergen Reactions  . Acetaminophen-Codeine     REACTION: hives  . Altace [Ramipril]     Other reaction(s): Weal  . Codeine     Other reaction(s): Other (qualifier value) REACTION: hives  . Other     Other reaction(s): Fever  . Sulfonamide Derivatives     REACTION: hives  . Sulfa Antibiotics Rash    Other reaction(s):  Weal    Social History   Social History  . Marital Status: Married    Spouse Name: N/A  . Number of Children: 3  . Years of Education: N/A   Occupational History  . retired Airline pilot    Social History Main Topics  . Smoking status: Former Smoker -- 4.00 packs/day for 23 years    Types: Cigarettes    Quit date: 12/23/1987  . Smokeless tobacco: Never Used  . Alcohol Use: No  . Drug Use: No  . Sexual Activity: Not on file   Other Topics Concern  . Not on file   Social History Narrative   Lives in [place], [place] with wife.      Family History  Problem Relation Age of Onset  . Coronary artery disease Mother   . Heart attack Mother     x10  . Heart disease Mother   . Stroke Father   . Heart disease Father   . Heart attack Brother     with bypass   . Heart attack Sister     with bypass   . Cancer Sister     bone  . Pancreatic cancer Brother     pancreatic  . Heart disease Maternal Grandmother   . Heart disease Maternal Grandfather   . Leukemia Sister   . Cancer Maternal Aunt     ? type     Review of Systems: General: negative for chills, fever, night sweats or weight changes.  ENT: negative for rhinorrhea or epistaxis Cardiovascular: see HPI Dermatological: negative for rash Respiratory: negative for wheezing,  positive for shortness of breath and cough GI: negative for nausea, vomiting, diarrhea, bright red blood per rectum, melena, or hematemesis GU: no hematuria, urgency, or frequency Neurologic: negative for visual changes, syncope, headache, or dizziness Heme: no easy bruising or bleeding Endo: negative for excessive thirst, thyroid disorder, or flushing. Positive for diabetes Musculoskeletal: negative for joint pain or swelling, negative for myalgias All other systems reviewed and are otherwise negative except as noted above.  Physical Exam: Blood pressure 126/61, pulse 99, temperature 98.3 F (36.8 C), temperature source Oral, resp. rate 30, height 5\' 6"  (1.676 m), weight 146 lb 9.7 oz (66.5 kg), SpO2 93 %. Pt is alert and oriented, chronically ill-appearing, in no distress. HEENT: normal Neck: JVP moderately elevated. Carotid upstrokes delayed Lungs: equal expansion, clear bilaterally CV: Apex is diffuse, irregular with 3/6 harsh systolic murmur best heard at the apex Abd: soft, NT, +BS, colostomy bag present Back: no CVA tenderness Ext: no C/C/E Skin: warm and dry without rash Neuro: CNII-XII intact             Strength intact = bilaterally    Labs: No results for input(s): CKTOTAL, CKMB, TROPONINI in the last 72 hours. Lab Results  Component Value Date   WBC 4.6 11/22/2015   HGB 11.4* 11/22/2015   HCT 35.5* 11/22/2015   MCV 91.7 11/22/2015   PLT 88* 11/22/2015    Recent Labs Lab 11/22/15 0425  NA 142  K 3.1*  CL 96*  CO2 38*  BUN 18  CREATININE 1.10  CALCIUM 8.9  GLUCOSE 109*   Lab Results  Component Value Date   CHOL 108 01/17/2012   HDL 30* 07/30/2010   LDLCALC 71 07/30/2010   TRIG 176* 01/17/2012   No results found for: DDIMER  Radiology/Studies:  Dg Chest 2 View  11/13/2015  CLINICAL DATA:  Shortness of breath for several months. EXAM: CHEST  2 VIEW COMPARISON:  November 08, 2015. FINDINGS: Stable cardiomegaly. Status post coronary artery bypass  graft. No pneumothorax is noted. Stable mild central pulmonary vascular congestion is noted with possible minimal bibasilar edema. Mild bilateral pleural effusions are noted. IMPRESSION: Stable cardiomegaly and central pulmonary vascular congestion is noted with minimal bibasilar edema and mild bilateral pleural effusions. Electronically Signed   By: Marijo Conception, M.D.   On: 11/13/2015 14:12   Dg Chest 2 View  11/08/2015  CLINICAL DATA:  74 year old male with shortness of breath, cough and wheezing for 3 months. EXAM: CHEST  2 VIEW COMPARISON:  10/09/2015 and prior radiographs FINDINGS: Cardiomegaly, CABG changes, bibasilar atelectasis and small effusions again noted. There is no evidence of pneumothorax or acute bony abnormality. No interval changes identified. IMPRESSION: Unchanged appearance of the chest with cardiomegaly, bibasilar atelectasis and small effusions. Electronically Signed   By: Margarette Canada M.D.   On: 11/08/2015 15:43   Dg Chest Port 1 View  11/21/2015  CLINICAL DATA:  PICC line placement. EXAM: PORTABLE CHEST 1 VIEW COMPARISON:  November 13, 2015 FINDINGS: Stable cardiomegaly. Status post coronary artery bypass graft. No pneumothorax is noted. Interval placement of right-sided PICC line with distal tip overlying expected position of the SVC. Mild right basilar subsegmental atelectasis is noted. Left basilar opacity is noted concerning for atelectasis or edema with associated pleural effusion. Bony thorax is unremarkable. IMPRESSION: Mild left basilar opacity concerning for atelectasis or edema with associated pleural effusion. Mild right basilar subsegmental atelectasis is noted. Interval placement of right-sided PICC line with distal tip overlying expected position of the SVC. Electronically Signed   By: Marijo Conception, M.D.   On: 11/21/2015 13:16   Cardiac Studies: 2D Echo 11/19/2015: Study Conclusions  - Left ventricle: The cavity size was normal. Systolic function  was severely reduced. The estimated ejection fraction was in the range of 20% to 25%. Diffuse hypokinesis. Akinesis of the inferior myocardium. Hypokinesis of the lateral myocardium. Hypokinesis of the apical myocardium. - Ventricular septum: The contour showed diastolic flattening. - Aortic valve: Transvalvular velocity was increased. There was at least moderate stenosis visually, mild stenosis by gradient (underestimated secondary to severely depressed EF). . Valve area (VTI): 1.24 cm^2. Valve area (Vmean): 1.08 cm^2. - Mitral valve: A mechanical prosthesis was present. Valve area by pressure half-time: 2.16 cm^2. Valve area by continuity equation (using LVOT flow): 1.43 cm^2. - Left atrium: The atrium was moderately dilated. - Right ventricle: The cavity size was moderately dilated. Wall thickness was normal. Systolic function was moderately reduced. - Tricuspid valve: There was moderate regurgitation. - Pulmonary arteries: Systolic pressure was severely elevated. PA peak pressure: 71 mm Hg (S).  Impressions:  - LV function has decreased, significant increase in right heart pressures compared to prior study.  Dobutamine Echo: Impressions:  - Baseline: AVA 1.57 cm^2, mean gradient 13 mmHg. Dobutamine 5: AVA 1.51 cm^2, mean gradient 13 mmHg. Dobutamine 10: AVA 1.71 cm^2, mean gradient 12 mmHg. Dobutamine 15: AVA 1.55 cm^2, mean gradient 17 mmHg. Dobutamine 20: AVA 1.3 cm^2, mean gradient 20 mmHg.  From this study, aortic stenosis appears moderate (not severe).  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 I have personally reviewed his 2D echo images and he has severe biventricular dysfunction with pulmonary HTN and moderate calcification/restriction of all 3 aortic valve leaflets with moderately elevated transvalvular gradients. This study  is compared to previous studies and LV  dysfunction has declined significantly. The patient has severe heart failure, currently requiring low-dose inotropic Rx. I have reviewed the natural history of aortic stenosis with the patient and their family members who are present today. We have discussed the limitations of medical therapy and the poor prognosis associated with symptomatic aortic stenosis, especially in the setting of severe LV dysfunction. We have reviewed potential treatment options, including palliative medical therapy, conventional surgical aortic valve replacement, and transcatheter aortic valve replacement. We discussed treatment options in the context of this patient's specific comorbid medical conditions. While the patient would be high-risk, even for TAVR, the biggest question is whether he truly has severe aortic stenosis or whether he has a primary ischemic cardiomyopathy with moderate aortic stenosis.  A dobutamine echo is ordered to evaluate for severe low-gradient aortic stenosis and results are outlined above. I have also reviewed the hemodynamic findings and images of the patient's dobutamine study. This is suggestive of moderate aortic stenosis and there is no significant augmentation of LV contractility with dobutamine. After review of all available data, it appears the patient will be best treated with continued advanced heart failure therapies. At this point, treatment of aortic stenosis does not appear to be indicated.   Deatra James MD, [facility] 11/22/2015, 10:24 AM

## 2015-11-22 NOTE — Progress Notes (Signed)
Advanced Heart Failure Rounding Note   Subjective:    [name] is a 74 year old gentleman with CAD s/p CABG 1989, PCI of the RCA in 2002, severe COPD, CKD 3, s/p mechanical mitral valve replacement in 2007, chronic atrial fibrillation, diabetes, hypertension, hyperlipidemia, PNA in 03/2010, h/o diverticular disease s/p colon resection with ostomy admitted on 11/29 for worsening HF and renal failure in the setting of likely low-gradient severe AS and EF 25%  On dobutamine and IV lasix. Weight down 11 pounds. Breathing better. No orthopnea or PND. Feels weak. PICC placed. CVP measured personally = 5. Co-ox 71%. Renal function normalized. +cramps.     Objective:   Weight Range:  Vital Signs:   Temp:  [98.1 F (36.7 C)-98.3 F (36.8 C)] 98.3 F (36.8 C) (12/01 0400) Pulse Rate:  [71-110] 101 (12/01 0600) Resp:  [16-31] 30 (12/01 0600) BP: (88-139)/(47-75) 126/61 mmHg (12/01 0600) SpO2:  [91 %-98 %] 93 % (12/01 0600) Weight:  [66.5 kg (146 lb 9.7 oz)] 66.5 kg (146 lb 9.7 oz) (12/01 0421) Last BM Date: 11/21/15  Weight change: Filed Weights   11/20/15 1400 11/21/15 0500 11/22/15 0421  Weight: 71.4 kg (157 lb 6.5 oz) 67.4 kg (148 lb 9.4 oz) 66.5 kg (146 lb 9.7 oz)    Intake/Output:   Intake/Output Summary (Last 24 hours) at 11/22/15 0748 Last data filed at 11/22/15 0600  Gross per 24 hour  Intake 1197.1 ml  Output   3175 ml  Net -1977.9 ml     Physical Exam: General: Elderly. Chronically ill appearing. No resp difficulty HEENT: normal Neck: supple. JVP 5 Carotids 1+ bilaterally; no bruits. No lymphadenopathy or thryomegaly appreciated. Cor: PMI normal IRR IRR. 2/6 AS with reduced s2 mechanical s1  Lungs: decreased throughout otherwise clear Abdomen: soft, nontender, nondistended. No hepatosplenomegaly. N+ bruit Good bowel sounds. +colostomy LLQ Extremities: no cyanosis, clubbing, rash, no  Edema  RUE [facility] Neuro: alert & orientedx3, cranial nerves grossly intact.  Moves all 4 extremities w/o difficulty. Affect pleasant.  Telemetry: AF  Labs: Basic Metabolic Panel:  Recent Labs Lab 11/21/15 0320 11/22/15 0425  NA 142 142  K 3.6 3.1*  CL 101 96*  CO2 32 38*  GLUCOSE 94 109*  BUN 26* 18  CREATININE 1.22 1.10  CALCIUM 9.2 8.9    Liver Function Tests: No results for input(s): AST, ALT, ALKPHOS, BILITOT, PROT, ALBUMIN in the last 168 hours. No results for input(s): LIPASE, AMYLASE in the last 168 hours. No results for input(s): AMMONIA in the last 168 hours.  CBC:  Recent Labs Lab 11/20/15 1705 11/22/15 0426  WBC 3.6* 4.6  HGB 11.4* 11.4*  HCT 36.1* 35.5*  MCV 92.8 91.7  PLT 91* 88*    Cardiac Enzymes: No results for input(s): CKTOTAL, CKMB, CKMBINDEX, TROPONINI in the last 168 hours.  BNP: BNP (last 3 results) No results for input(s): BNP in the last 8760 hours.  ProBNP (last 3 results)  Recent Labs  11/13/15 1559  PROBNP 1778.0*      Other results:  Imaging: Dg Chest Port 1 View  11/21/2015  CLINICAL DATA:  PICC line placement. EXAM: PORTABLE CHEST 1 VIEW COMPARISON:  November 13, 2015 FINDINGS: Stable cardiomegaly. Status post coronary artery bypass graft. No pneumothorax is noted. Interval placement of right-sided PICC line with distal tip overlying expected position of the SVC. Mild right basilar subsegmental atelectasis is noted. Left basilar opacity is noted concerning for atelectasis or edema with associated pleural effusion. Bony thorax is  unremarkable. IMPRESSION: Mild left basilar opacity concerning for atelectasis or edema with associated pleural effusion. Mild right basilar subsegmental atelectasis is noted. Interval placement of right-sided PICC line with distal tip overlying expected position of the SVC. Electronically Signed   By: Marijo Conception, M.D.   On: 11/21/2015 13:16     Medications:     Scheduled Medications: . antiseptic oral rinse  7 mL Mouth Rinse BID  . Chlorhexidine Gluconate Cloth   6 each Topical Q0600  . cholecalciferol  1,000 Units Oral Daily  . dextromethorphan-guaiFENesin  1 tablet Oral Q12H  . digoxin  0.125 mg Oral Daily  . insulin aspart  0-15 Units Subcutaneous TID WC  . latanoprost  1 drop Both Eyes QHS  . losartan  25 mg Oral Daily  . mupirocin ointment  1 application Nasal BID  . simvastatin  40 mg Oral QHS  . sodium chloride  10-40 mL Intracatheter Q12H  . sodium chloride  3 mL Intravenous Q12H  . sodium chloride  3 mL Intravenous Q12H  . tamsulosin  0.4 mg Oral Daily    Infusions: . sodium chloride 10 mL/hr at 11/21/15 2115  . DOBUTamine 3 mcg/kg/min (11/21/15 0800)  . heparin 1,000 Units/hr (11/22/15 0545)    PRN Medications: sodium chloride, sodium chloride, acetaminophen, ondansetron (ZOFRAN) IV, sodium chloride, sodium chloride, sodium chloride   Assessment:   1. Acute on chronic systolic HF with biventricular failure     EF 25% 2. Probable severe low gradient AS 3. Severe COPD 4. CAD s/p CABG 1989 5. S/p mechanical MV replacement via lateral thoracotomy at [facility] (Dr. Evelina Dun) in 2007 6. Acute on chronic renal failure, stage III-IV 7. DM2 8 Chronic AF 9. Hypokalemia  Plan/Discussion:     He has responded well to IV lasix and dobutamine. Volume status and renal function improved. Repeat echo done 11/30 on low-dose dobutamine (60mcg/kg/min) did not show any increase in AoV gradient but visually AS looks severe. May need higher dose dobutamine challenge. Alternatively could consider TEE or LHC next week with invasive dobutamine challenge.   Volume status much improved and renal function has normalized. Will stop IV lasix today. Continue dobutamine. Plan RHC today. Dr. Burt Knack will see today to see if TAVR may be an option. That said, I am concerned about end-stage severe AS with severe LV dysfunction and I am not sure he will have any durable options. Coumadin being held. Start heparin when INR < 2.5 (mechanical MV).  Metformin on hold  due to low-output HF. Covered with SSI. Will supp K= today.   Length of Stay: 2   Glori Bickers  MD  11/22/2015, 7:48 AM  Advanced Heart Failure Team Pager [phone] (M-F; 7a - 4p)  Please contact [facility] for night-coverage after hours (4p -7a ) and weekends on [email or web address]

## 2015-11-22 NOTE — Progress Notes (Signed)
Dobutamine echo for valve disease completed without complications.  Echo results to follow.

## 2015-11-23 ENCOUNTER — Ambulatory Visit: Admission: RE | Admit: 2015-11-23 | Payer: Medicare Other | Source: Ambulatory Visit

## 2015-11-23 DIAGNOSIS — M1000 Idiopathic gout, unspecified site: Secondary | ICD-10-CM

## 2015-11-23 LAB — GLUCOSE, CAPILLARY
GLUCOSE-CAPILLARY: 209 mg/dL — AB (ref 65–99)
GLUCOSE-CAPILLARY: 323 mg/dL — AB (ref 65–99)
Glucose-Capillary: 107 mg/dL — ABNORMAL HIGH (ref 65–99)
Glucose-Capillary: 315 mg/dL — ABNORMAL HIGH (ref 65–99)

## 2015-11-23 LAB — BASIC METABOLIC PANEL
Anion gap: 9 (ref 5–15)
BUN: 14 mg/dL (ref 6–20)
CHLORIDE: 96 mmol/L — AB (ref 101–111)
CO2: 36 mmol/L — AB (ref 22–32)
CREATININE: 1.01 mg/dL (ref 0.61–1.24)
Calcium: 9 mg/dL (ref 8.9–10.3)
GFR calc Af Amer: >60 mL/min (ref >60)
GFR calc non Af Amer: >60 mL/min (ref >60)
GLUCOSE: 120 mg/dL — AB (ref 65–99)
Potassium: 4.2 mmol/L (ref 3.5–5.1)
SODIUM: 141 mmol/L (ref 135–145)

## 2015-11-23 LAB — PROTIME-INR
INR: 2.24 — ABNORMAL HIGH (ref 0.00–1.49)
Prothrombin Time: 24.6 seconds — ABNORMAL HIGH (ref 11.6–15.2)

## 2015-11-23 LAB — MAGNESIUM: MAGNESIUM: 2.6 mg/dL — AB (ref 1.7–2.4)

## 2015-11-23 LAB — CARBOXYHEMOGLOBIN
CARBOXYHEMOGLOBIN: 2 % — AB (ref 0.5–1.5)
METHEMOGLOBIN: 0.8 % (ref 0.0–1.5)
O2 Saturation: 59.1 %
Total hemoglobin: 11.2 g/dL — ABNORMAL LOW (ref 13.5–18.0)

## 2015-11-23 LAB — CBC
HCT: 35.9 % — ABNORMAL LOW (ref 39.0–52.0)
HEMOGLOBIN: 11 g/dL — AB (ref 13.0–17.0)
MCH: 28.6 pg (ref 26.0–34.0)
MCHC: 30.6 g/dL (ref 30.0–36.0)
MCV: 93.5 fL (ref 78.0–100.0)
PLATELETS: 92 10*3/uL — AB (ref 150–400)
RBC: 3.84 MIL/uL — AB (ref 4.22–5.81)
RDW: 16.3 % — ABNORMAL HIGH (ref 11.5–15.5)
WBC: 5.4 10*3/uL (ref 4.0–10.5)

## 2015-11-23 LAB — URIC ACID: URIC ACID, SERUM: 6.1 mg/dL (ref 4.4–7.6)

## 2015-11-23 MED ORDER — VANCOMYCIN HCL IN DEXTROSE 1-5 GM/200ML-% IV SOLN
1000.0000 mg | Freq: Once | INTRAVENOUS | Status: AC
  Administered 2015-11-23: 1000 mg via INTRAVENOUS
  Filled 2015-11-23: qty 200

## 2015-11-23 MED ORDER — FUROSEMIDE 10 MG/ML IJ SOLN: 40.0000 mg | Freq: Two times a day (BID) | INTRAMUSCULAR | Status: DC

## 2015-11-23 MED ORDER — FUROSEMIDE 40 MG PO TABS
40.0000 mg | ORAL_TABLET | Freq: Two times a day (BID) | ORAL | Status: DC
  Administered 2015-11-23 – 2015-11-24 (×4): 40 mg via ORAL
  Filled 2015-11-23 (×4): qty 1

## 2015-11-23 MED ORDER — PREDNISONE 20 MG PO TABS
40.0000 mg | ORAL_TABLET | Freq: Every day | ORAL | Status: DC
  Administered 2015-11-23 – 2015-11-24 (×2): 40 mg via ORAL
  Filled 2015-11-23 (×2): qty 2

## 2015-11-23 NOTE — Telephone Encounter (Signed)
LMTCB x 4 Message to be closed per triage protocol Pt advised via vmail that when returning call to restate why he is calling.

## 2015-11-23 NOTE — Consult Note (Signed)
[address]       [place] [address]             [phone]        [name] [facility] Medical Record [id number] Date of Birth: [birth year]  Referring: Maryland Pink, MD Primary Care: Maryland Pink, MD  Chief Complaint:   No chief complaint on file. patient admitted with symptoms of advanced heart failure, ankle and abdominal bloating and weakness  History of Present Illness:     Patient examined, 2-D echocardiogram and right heart cath data personally reviewed. Last CT chest personally reviewed.  74 year old Caucasian male diabetic reformed smoker with COPD on home oxygen presents with ischemic cardiomyopathy, ejection fraction 20-25% and class 4 CHF.  The patient had CABG x3 in 1989 at [facility]. The patient developed severe mitral regurgitation and had a mechanical 27 mm St. Jude valve placed via right anterior thoracotomy and 2007. Patient stopped smoking and drinking at that time.  Since admission the patient has been treated with diuretics and IV dobutamine. Patient has evidence of a sclerotic aortic valve and probably only mild to moderate aortic stenosis.  The patient as tolerated Coumadin without bleeding problems over the past years. He has had emergency left colectomy for ruptured diverticulum and has a left-sided colostomy.  The patient has chronic atrial fibrillation but has never had a stroke. Patient's renal function has been fairly stable and he is never required dialysis. The patient's COPD is significant and has prolonged his ventilation time after his 2 previous cardiac operations--he usually requires actual 1-2 days of ventilation before being extubated. This also occurred after his emergency laparotomy 3 years ago.  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 Current Activity/ Functional Status: Currently non ambulatory   Zubrod Score: At the time of surgery this patient's most appropriate activity status/level should be described as: []     0    Normal activity, no symptoms []     1    Restricted in physical strenuous activity but ambulatory, able to do out light work []     2    Ambulatory and capable of self care, unable to do work activities, up and about                 more than 50%  Of the time                            []     3    Only limited self care, in bed greater than 50% of waking hours [x]     4    Completely disabled, no self care, confined to bed or chair []     5    Moribund  Past Medical History  Diagnosis Date  . Chronic atrial fibrillation (HCC)     a. warfarin; b. CHADSVASc 6 (CHF, HTN, age x 2, DM, vascular disease) giving him an estimated annual stroke risk of 9.8%  . Hypertension   . Hyperkalemia   . Chronic systolic CHF (congestive heart failure) ([place])     a. echo 10/2014: EF 30-35%, AK of inf & post myocardium, HK of  apical & lat wall, ant & antsept wall best preserved, mitral: mechanical prosthesis was present & well seated, LA moderately dilated, RV systolic fxn mildly reduced, PASP nl  . Coronary artery disease     Cath 2005- patent LIMA-LAD, SVG-OM grafts, moderate MR  . Prostate cancer ([place]) 04/2009  . Hyperlipidemia   . COPD (chronic obstructive pulmonary disease) ([place])     URI 9/12  . DM type 2 (diabetes mellitus, type 2) ([place])   . PNA (pneumonia) 03/2010  . Glaucoma   . Hard of hearing   . Myocardial infarction ([place])   . Gout   . Adenomatous polyps   . Diverticulosis     left side   . H/O mitral valve prolapse   . Sigmoid diverticulitis     perforated    Past Surgical History  Procedure Laterality Date  . Coronary artery bypass graft  1989    LIMA-LAD, SVG-OM  . Mitral valve replacement   2007    Mechanical, performed at [facility]  . Hernia repair    . External ear surgery      drum  . Colon resection  01/12/2012    sigmoid colectomy and colostomy for diverticulitis  . Cataract extraction    . Colonoscopy  11/23/2008    History  Smoking status  . Former Smoker -- 4.00 packs/day for 23 years  . Types: Cigarettes  . Quit date: 12/23/1987  Smokeless tobacco  . Never Used    History  Alcohol Use No    Social History   Social History  . Marital Status: Married    Spouse Name: N/A  . Number of Children: 3  . Years of Education: N/A   Occupational History  . retired Airline pilot    Social History Main Topics  . Smoking status: Former Smoker -- 4.00 packs/day for 23 years    Types: Cigarettes    Quit date: 12/23/1987  . Smokeless tobacco: Never Used  . Alcohol Use: No  . Drug Use: No  . Sexual Activity: Not on file   Other Topics Concern  . Not on file   Social History Narrative   Lives in [place], [place] with wife.     Allergies  Allergen Reactions  . Acetaminophen-Codeine     REACTION: hives  . Altace [Ramipril]     Other reaction(s): Weal  . Codeine     Other reaction(s): Other (qualifier value) REACTION: hives  . Other     Other reaction(s): Fever  . Sulfonamide Derivatives     REACTION: hives  . Sulfa Antibiotics Rash    Other reaction(s): Weal    Current Facility-Administered Medications  Medication Dose Route Frequency Provider Last Rate Last Dose  . 0.9 %  sodium chloride infusion  250 mL Intravenous PRN Satira Mccallum Tillery, PA-C 7 mL/hr at 11/22/15 0700 250 mL at 11/22/15 0700  . 0.9 %  sodium chloride infusion  250 mL Intravenous PRN Jolaine Artist, MD      . 0.9 %  sodium chloride infusion   Intravenous Continuous Jolaine Artist, MD 10 mL/hr at 11/21/15 2115    . acetaminophen (TYLENOL) tablet 650 mg  650 mg Oral Q4H PRN Shirley Friar, PA-C   650 mg at 11/23/15 0418  . antiseptic oral rinse (CPC / CETYLPYRIDINIUM  CHLORIDE 0.05%) solution 7 mL  7 mL Mouth Rinse BID Jolaine Artist, MD   7 mL at 11/23/15 [id number]  . Chlorhexidine Gluconate Cloth 2 % PADS 6 each  6 each Topical Q0600 Jolaine Artist, MD   6 each at 11/23/15 [phone]  . cholecalciferol (VITAMIN D) tablet 1,000 Units  1,000 Units Oral Daily Jolaine Artist, MD   1,000 Units at 11/23/15 [phone]  . dextromethorphan-guaiFENesin (MUCINEX DM) 30-600 MG per 12 hr tablet 1 tablet  1 tablet Oral Q12H Jolaine Artist, MD   1 tablet at 11/23/15 [phone]  . digoxin (LANOXIN) tablet 0.125 mg  0.125 mg Oral Daily Shirley Friar, PA-C   0.125 mg at 11/23/15 [id number]  . DOBUTamine (DOBUTREX) infusion 4000 mcg/mL  3 mcg/kg/min Intravenous Titrated Jolaine Artist, MD 3.2 mL/hr at 11/22/15 1513 3 mcg/kg/min at 11/22/15 1513  . furosemide (LASIX) tablet 40 mg  40 mg Oral BID Jolaine Artist, MD   40 mg at 11/23/15 0813  . insulin aspart (novoLOG) injection 0-15 Units  0-15 Units Subcutaneous TID WC Jolaine Artist, MD   3 Units at 11/22/15 1753  . latanoprost (XALATAN) 0.005 % ophthalmic solution 1 drop  1 drop Both Eyes QHS Shirley Friar, PA-C   1 drop at 11/22/15 2201  . mupirocin ointment (BACTROBAN) 2 % 1 application  1 application Nasal BID Jolaine Artist, MD   1 application at [id number] [phone]  . ondansetron (ZOFRAN) injection 4 mg  4 mg Intravenous Q6H PRN Satira Mccallum Tillery, PA-C      . predniSONE (DELTASONE) tablet 40 mg  40 mg Oral Q breakfast Jolaine Artist, MD   40 mg at 11/23/15 0813  . simvastatin (ZOCOR) tablet 40 mg  40 mg Oral QHS Shirley Friar, PA-C   40 mg at 11/22/15 2158  . sodium chloride 0.9 % injection 10-40 mL  10-40 mL Intracatheter Q12H Jolaine Artist, MD   10 mL at 11/23/15 0923  . sodium chloride 0.9 % injection 10-40 mL  10-40 mL Intracatheter PRN Jolaine Artist, MD      . sodium chloride 0.9 % injection 3 mL  3 mL Intravenous Q12H Shirley Friar, PA-C   3 mL at 11/22/15 2200  .  sodium chloride 0.9 % injection 3 mL  3 mL Intravenous PRN Satira Mccallum Tillery, PA-C      . sodium chloride 0.9 % injection 3 mL  3 mL Intravenous Q12H Jolaine Artist, MD   3 mL at 11/23/15 0923  . sodium chloride 0.9 % injection 3 mL  3 mL Intravenous PRN Jolaine Artist, MD      . tamsulosin [facility]) capsule 0.4 mg  0.4 mg Oral Daily Shirley Friar, PA-C   0.4 mg at 11/23/15 [id number]    Prescriptions prior to admission  Medication Sig Dispense Refill Last Dose  . carvedilol (COREG) 12.5 MG tablet Take 1 tablet (12.5 mg  total) by mouth 2 (two)  times daily with a meal. 180 tablet 3 11/20/2015 at 0800  . Cholecalciferol (VITAMIN D3) 1000 UNITS CAPS Take 1 capsule by mouth daily.     11/19/2015 at Unknown time  . dextromethorphan-guaiFENesin (MUCINEX DM) 30-600 MG per 12 hr tablet Take 1 tablet by mouth every 12 (twelve) hours.   11/20/2015 at Unknown time  . digoxin (DIGOX) 0.125 MG tablet Take 1 tablet by mouth  daily 90 tablet 3 11/20/2015 at Unknown time  . furosemide (LASIX) 20 MG tablet Take 1 tablet (20 mg total) by mouth 2 (two) times daily as needed. (Patient taking differently: Take 20 mg by mouth 2 (two) times daily as needed for fluid. )  180 tablet 3 11/20/2015 at Unknown time  . latanoprost (XALATAN) 0.005 % ophthalmic solution Place 1 drop into both eyes at bedtime.    11/19/2015 at Unknown time  . losartan (COZAAR) 100 MG tablet Take one-half tablet by  mouth daily 45 tablet 3 11/19/2015 at Unknown time  . metFORMIN (GLUCOPHAGE) 500 MG tablet Take 1,000 mg by mouth 2 (two) times daily with a meal.    11/20/2015 at Unknown time  . Multiple Vitamins-Minerals (PRESERVISION/LUTEIN PO) Apply to eye at bedtime.   11/19/2015 at Unknown time  . simvastatin (ZOCOR) 40 MG tablet Take 40 mg by mouth at bedtime.    11/19/2015 at Unknown time  . Tamsulosin HCl (FLOMAX) 0.4 MG CAPS Take 0.4 mg by mouth daily.    11/20/2015 at Unknown time  . Tiotropium Bromide Monohydrate (SPIRIVA  RESPIMAT) 2.5 MCG/ACT AERS Inhale 2 puffs into the lungs daily as needed. For shortness of breath   11/19/2015 at Unknown time  . warfarin (COUMADIN) 2.5 MG tablet Take 2.5-5 mg by mouth daily. Take two of the 2.5mg  tablets=5mg  on Wednesday and Saturday. Rest of the days take one 2.5mg . Per wife   11/19/2015 at Unknown time  . SPIRIVA RESPIMAT 2.5 MCG/ACT AERS Use 2 puffs daily (Patient not taking: Reported on 11/20/2015) 12 g 1 Not Taking at Unknown time  . warfarin (COUMADIN) 2.5 MG tablet Take as directed by  Coumadin Clinic (Patient not taking: Reported on 11/20/2015) 120 tablet 1 Not Taking at Unknown time    Family History  Problem Relation Age of Onset  . Coronary artery disease Mother   . Heart attack Mother     x10  . Heart disease Mother   . Stroke Father   . Heart disease Father   . Heart attack Brother     with bypass   . Heart attack Sister     with bypass   . Cancer Sister     bone  . Pancreatic cancer Brother     pancreatic  . Heart disease Maternal Grandmother   . Heart disease Maternal Grandfather   . Leukemia Sister   . Cancer Maternal Aunt     ? type     Review of Systems:       Cardiac Review of Systems: Y or N  Chest Pain [   no ]  Resting SOB [ yes  ] Exertional SOB  [ yes ]  Orthopnea [place]  ]   Pedal Edema [  yes ]    Palpitations [ no ] Syncope  [ no ]   Presyncope [no   ]no recent falls  General Review of Systems: [Y] = yes [  ]=no Constitional: recent weight change [  ]; anorexia [  ]; fatigue [  ]; nausea [  ]; night sweats [  ]; fever [  ]; or chills [  ]                                                               Dental: poor dentition[  ]; Last Dentist visit:edentulous uses plates   Eye : blurred vision [  ]; diplopia [   ]; vision changes [  ];  Amaurosis fugax[  ]; Resp: cough [  ];  wheezing[  ];  hemoptysis[  ]; shortness of breath[ yes ]; paroxysmal nocturnal dyspnea[yes  ]; dyspnea on exertion[ yes ]; or orthopnea[ yes ];  GI:   gallstones[  ], vomiting[  ];  dysphagia[  ]; melena[  ];  hematochezia [  ]; heartburn[  ];   Hx of  Colonoscopy[yes  ];colostomy left upper quadrant GU: kidney stones [  ]; hematuria[  ];   dysuria [  ];  nocturia[  ];  history of     obstruction [yes history prostate cancer  ]; urinary frequency [  ]             Skin: rash, swelling[  ];, hair loss[  ];  peripheral edema[  ];  or itching[  ]; Musculosketetal: myalgias[  ];  joint swelling[  ];  joint erythema[  ];yes gout in both feet  joint pain[ yes ];  back pain[  ];  Heme/Lymph: bruising[  ];  bleeding[  ];  anemia[  ];  Neuro: TIA[  ];  headaches[  ];  stroke[  ];  vertigo[  ];  seizures[  ];   paresthesias[  ];  difficulty walking[  ];  Psych:depression[  ]; anxiety[  ];  Endocrine: diabetes[yes controlled with oral meds  ];  thyroid dysfunction[  ];  Immunizations: Flu [  ]; Pneumococcal[  ];  Other:right-hand dominant  Physical Exam: BP 116/71 mmHg  Pulse 98  Temp(Src) 97.8 F (36.6 C) (Oral)  Resp 23  Ht 5\' 6"  (1.676 m)  Wt 147 lb 0.8 oz (66.7 kg)  BMI 23.75 kg/m2  SpO2 92%      Physical Exam  General:chronically ill and frail Caucasian male sitting in a recliner in the CCU HEENT: Normocephalic pupils equal , dentition adequate Neck: Supple with mild JVD, no  adenopathy, or bruit Chest:scattered rhonchi but with, symmetrical breath sounds, , no tenderness. Well-healed sternal incision. Well-healed right anterior thoracotomy incision            Cardiovascular: irregular rate , systolic murmur of aortic stenosis, , mechanical mitral valve closure sounds, peripheral pulses trace-palpable in all extremities         Abdomen:  Soft, nontender, no palpable mass or organomegaly, positive edema Extremities: Warm, well-perfused, no clubbing cyanosis edema or tenderness,              Significant changes of venous stasis changes of both legs. Rectal/GU: Deferred Neuro: Grossly non--focal and with generalized weakness . Hard of  hearing. Unable to stand because of feet tenderness from gout. Skin: Clean and dry without rash or ulceration   Diagnostic Studies & Laboratory data:     Recent Radiology Findings:   Dg Chest Port 1 View  11/21/2015  CLINICAL DATA:  PICC line placement. EXAM: PORTABLE CHEST 1 VIEW COMPARISON:  November 13, 2015 FINDINGS: Stable cardiomegaly. Status post coronary artery bypass graft. No pneumothorax is noted. Interval placement of right-sided PICC line with distal tip overlying expected position of the SVC. Mild right basilar subsegmental atelectasis is noted. Left basilar opacity is noted concerning for atelectasis or edema with associated pleural effusion. Bony thorax is unremarkable. IMPRESSION: Mild left basilar opacity concerning for atelectasis or edema with associated pleural effusion. Mild right basilar subsegmental atelectasis is noted. Interval placement of right-sided PICC line with distal tip overlying expected position of the SVC. Electronically Signed   By: Marijo Conception, M.D.   On: 11/21/2015 13:16     I have independently reviewed the above radiologic studies.  Recent Lab  Findings: Lab Results  Component Value Date   WBC 5.4 11/23/2015   HGB 11.0* 11/23/2015   HCT 35.9* 11/23/2015   PLT 92* 11/23/2015   GLUCOSE 120* 11/23/2015   CHOL 108 01/17/2012   TRIG 176* 01/17/2012   HDL 30* 07/30/2010   LDLCALC 71 07/30/2010   ALT 41 03/13/2014   AST 42* 03/13/2014   NA 141 11/23/2015   K 4.2 11/23/2015   CL 96* 11/23/2015   CREATININE 1.01 11/23/2015   BUN 14 11/23/2015   CO2 36* 11/23/2015   INR 2.24* 11/23/2015   HGBA1C 8.0* 01/11/2012      Assessment / Plan:       Ischemic cardiomyopathy status post CABG with advanced heart failure, repeat right and left heart cath pending next week Status post mechanical mitral valve replacement for severe mitral regurgitation with normally functioning prosthetic valve Moderate to severe COPD on home oxygen Moderate RV  dysfunction by 2-D echocardiogram with mild to moderate aortic stenosis  I spent a considerable time with patient and family counseling them regarding his current cardiac disease and the issues regarding potential treatment with VAD support. The patient has had 2 previous cardiac operations. He is currently extremely deconditioned and frail. Destination VAD implantation would also require replacement of the mechanical valve with a bioprosthetic valve in order to prevent postoperative mechanical valve emboli-stroke. I do not feel the patient appears strong enough  tolerate redo mitral valve replacement and VAD implantation . The decision has been made  That transarterial aVR would benefit the patient.  After right and left heart cath next week the patient's family would probably wish to be evaluated for second opinion at [facility] for destination VAD      @ME1 @ 11/23/2015 12:03 PM

## 2015-11-23 NOTE — Progress Notes (Addendum)
Advanced Heart Failure Rounding Note   Subjective:    [name] is a 74 year old gentleman with CAD s/p CABG 1989, PCI of the RCA in 2002, severe COPD, CKD 3, s/p mechanical mitral valve replacement in 2007, chronic atrial fibrillation, diabetes, hypertension, hyperlipidemia, PNA in 03/2010, h/o diverticular disease s/p colon resection with ostomy admitted on 11/29 for worsening HF and renal failure in the setting of likely low-gradient severe AS and EF 25%  Has diuresed well on IV lasix and dobutamine. Dobutamine echo on 12/1 shows mild to moderate AS. With persistent severe LV (and RV) dysfunction.   Denies dyspnea. + severe ankle pain. Unable to stand.  Continue to ooze from PICC line site. Despite holding heparin. INR 2.2. Co-ox 59% on dobutamine 3.Weight stable off diuretics. VP 12     Objective:   Weight Range:  Vital Signs:   Temp:  [98.4 F (36.9 C)-98.7 F (37.1 C)] 98.5 F (36.9 C) (12/02 0400) Pulse Rate:  [38-106] 97 (12/02 0600) Resp:  [17-33] 29 (12/02 0600) BP: (101-129)/(52-91) 106/55 mmHg (12/02 0600) SpO2:  [89 %-100 %] 94 % (12/02 0600) Weight:  [66.7 kg (147 lb 0.8 oz)] 66.7 kg (147 lb 0.8 oz) (12/02 0500) Last BM Date: 11/22/15 (colostomy)  Weight change: Filed Weights   11/21/15 0500 11/22/15 0421 11/23/15 0500  Weight: 67.4 kg (148 lb 9.4 oz) 66.5 kg (146 lb 9.7 oz) 66.7 kg (147 lb 0.8 oz)    Intake/Output:   Intake/Output Summary (Last 24 hours) at 11/23/15 0644 Last data filed at 11/23/15 0600  Gross per 24 hour  Intake 1026.51 ml  Output   1075 ml  Net -48.49 ml     Physical Exam: General: Elderly. Chronically ill appearing. No resp difficulty HEENT: normal Neck: supple. JVP 12 Carotids 1+ bilaterally; no bruits. No lymphadenopathy or thryomegaly appreciated. Cor: PMI normal IRR IRR. 2/6 AS with reduced s2 mechanical s1  Lungs: decreased throughout otherwise clear Abdomen: soft, nontender, nondistended. No hepatosplenomegaly. N+ bruit  Good bowel sounds. +colostomy LLQ Extremities: no cyanosis, clubbing, rash, no  Edema  RUE PICC with bleeding from site + tender ankles bialterally Neuro: alert & orientedx3, cranial nerves grossly intact. Moves all 4 extremities w/o difficulty. Affect pleasant.  Telemetry: AF  Labs: Basic Metabolic Panel:  Recent Labs Lab 11/21/15 0320 11/22/15 0420 11/22/15 0425 11/23/15 0427  NA 142  --  142 141  K 3.6  --  3.1* 4.2  CL 101  --  96* 96*  CO2 32  --  38* 36*  GLUCOSE 94  --  109* 120*  BUN 26*  --  18 14  CREATININE 1.22  --  1.10 1.01  CALCIUM 9.2  --  8.9 9.0  MG  --  1.3*  --  2.6*    Liver Function Tests: No results for input(s): AST, ALT, ALKPHOS, BILITOT, PROT, ALBUMIN in the last 168 hours. No results for input(s): LIPASE, AMYLASE in the last 168 hours. No results for input(s): AMMONIA in the last 168 hours.  CBC:  Recent Labs Lab 11/20/15 1705 11/22/15 0426 11/23/15 0427  WBC 3.6* 4.6 5.4  HGB 11.4* 11.4* 11.0*  HCT 36.1* 35.5* 35.9*  MCV 92.8 91.7 93.5  PLT 91* 88* 92*    Cardiac Enzymes: No results for input(s): CKTOTAL, CKMB, CKMBINDEX, TROPONINI in the last 168 hours.  BNP: BNP (last 3 results) No results for input(s): BNP in the last 8760 hours.  ProBNP (last 3 results)  Recent Labs  11/13/15 1559  PROBNP 1778.0*      Other results:  Imaging: Dg Chest Port 1 View  11/21/2015  CLINICAL DATA:  PICC line placement. EXAM: PORTABLE CHEST 1 VIEW COMPARISON:  November 13, 2015 FINDINGS: Stable cardiomegaly. Status post coronary artery bypass graft. No pneumothorax is noted. Interval placement of right-sided PICC line with distal tip overlying expected position of the SVC. Mild right basilar subsegmental atelectasis is noted. Left basilar opacity is noted concerning for atelectasis or edema with associated pleural effusion. Bony thorax is unremarkable. IMPRESSION: Mild left basilar opacity concerning for atelectasis or edema with associated  pleural effusion. Mild right basilar subsegmental atelectasis is noted. Interval placement of right-sided PICC line with distal tip overlying expected position of the SVC. Electronically Signed   By: Marijo Conception, M.D.   On: 11/21/2015 13:16     Medications:     Scheduled Medications: . antiseptic oral rinse  7 mL Mouth Rinse BID  . Chlorhexidine Gluconate Cloth  6 each Topical Q0600  . cholecalciferol  1,000 Units Oral Daily  . dextromethorphan-guaiFENesin  1 tablet Oral Q12H  . digoxin  0.125 mg Oral Daily  . insulin aspart  0-15 Units Subcutaneous TID WC  . latanoprost  1 drop Both Eyes QHS  . mupirocin ointment  1 application Nasal BID  . simvastatin  40 mg Oral QHS  . sodium chloride  10-40 mL Intracatheter Q12H  . sodium chloride  3 mL Intravenous Q12H  . sodium chloride  3 mL Intravenous Q12H  . tamsulosin  0.4 mg Oral Daily    Infusions: . sodium chloride 10 mL/hr at 11/21/15 2115  . DOBUTamine    . DOBUTamine 3 mcg/kg/min (11/22/15 1513)    PRN Medications: sodium chloride, sodium chloride, acetaminophen, ondansetron (ZOFRAN) IV, sodium chloride, sodium chloride, sodium chloride   Assessment:   1. Acute on chronic systolic HF with biventricular failure     EF 25% 2. Probable severe low gradient AS 3. Severe COPD 4. CAD s/p CABG 1989 5. S/p mechanical MV replacement via lateral thoracotomy at [facility] (Dr. Evelina Dun) in 2007 6. Acute on chronic renal failure, stage III-IV 7. DM2 8 Chronic AF 9. Hypokalemia 10. + acute gout  Plan/Discussion:     He has responded well to IV lasix and dobutamine. Volume status and renal function improved. Diuretics held yesterday. Will resume today.  Dobutamine echo showed that AS likel mild to moderate at worst (mean gradient 56mm HG on dobutamine 20). Reviewed extensively with Dr. Burt Knack and feel main issues is biventricular dysfunction and not severe AS. Will plan R/L HC with coronary/graft angio on Monday. Have presented case  to Dr. Prescott Gum regarding possible VAD candidacy. RV dysfunction will be a hurdle but we have been able to get CVP down to 5 on dobutamine.   Continues to bleed from PICC line. Can try epi/lido injection but I am worried that he may get bacteremic with too much more manipulation and would be high-risk for endocarditis with mechanical MVR. Will give one dose vanc. May be best to pull PICC. Coumadin on hold for cath will need heparin soon.   Metformin on hold due to low-output HF. Covered with SSI. Start prednisone for gout. Check uric acid.  Can go to SDU  Length of Stay: 3   Glori Bickers  MD  11/23/2015, 6:44 AM  Advanced Heart Failure Team Pager [phone] (M-F; [place])  Please contact [facility] for night-coverage after hours (4p -7a ) and weekends  on [email or web address]

## 2015-11-23 NOTE — Progress Notes (Signed)
Transfer orders to move patient to [place] stepdown. Patient stated that ankle was feeling better (pain 1/10, prednisone given earlier today) and he felt able to ambulate to new room (approx 300 feet away Patient ambulated 1 person assist while pushing wheelchair. Denied any SOB, Dyspnea, or CP during walk. HR 90- low 100's and SATS 90% on 2 Liters during walk. Patient stated that he felt that he could walk more. Will continue to encourage and support ambulation.

## 2015-11-24 LAB — GLUCOSE, CAPILLARY
GLUCOSE-CAPILLARY: 133 mg/dL — AB (ref 65–99)
GLUCOSE-CAPILLARY: 331 mg/dL — AB (ref 65–99)
Glucose-Capillary: 182 mg/dL — ABNORMAL HIGH (ref 65–99)
Glucose-Capillary: 251 mg/dL — ABNORMAL HIGH (ref 65–99)

## 2015-11-24 LAB — BASIC METABOLIC PANEL
ANION GAP: 7 (ref 5–15)
Anion gap: 4 — ABNORMAL LOW (ref 5–15)
BUN: 26 mg/dL — ABNORMAL HIGH (ref 6–20)
BUN: 25 mg/dL — ABNORMAL HIGH (ref 6–20)
CALCIUM: 8.6 mg/dL — AB (ref 8.9–10.3)
CALCIUM: 8.8 mg/dL — AB (ref 8.9–10.3)
CHLORIDE: 99 mmol/L — AB (ref 101–111)
CO2: 35 mmol/L — AB (ref 22–32)
CO2: 33 mmol/L — ABNORMAL HIGH (ref 22–32)
CREATININE: 1.14 mg/dL (ref 0.61–1.24)
CREATININE: 1.1 mg/dL (ref 0.61–1.24)
Chloride: 101 mmol/L (ref 101–111)
GFR calc non Af Amer: >60 mL/min (ref >60)
GFR calc non Af Amer: >60 mL/min (ref >60)
Glucose, Bld: 148 mg/dL — ABNORMAL HIGH (ref 65–99)
Glucose, Bld: 312 mg/dL — ABNORMAL HIGH (ref 65–99)
Potassium: 4.3 mmol/L (ref 3.5–5.1)
Potassium: 4.2 mmol/L (ref 3.5–5.1)
SODIUM: 140 mmol/L (ref 135–145)
SODIUM: 139 mmol/L (ref 135–145)

## 2015-11-24 LAB — HEPARIN LEVEL (UNFRACTIONATED): HEPARIN UNFRACTIONATED: 0.26 [IU]/mL — AB (ref 0.30–0.70)

## 2015-11-24 LAB — CBC
HCT: 33.2 % — ABNORMAL LOW (ref 39.0–52.0)
HEMOGLOBIN: 10.4 g/dL — AB (ref 13.0–17.0)
MCH: 28.7 pg (ref 26.0–34.0)
MCHC: 31.3 g/dL (ref 30.0–36.0)
MCV: 91.5 fL (ref 78.0–100.0)
Platelets: 93 10*3/uL — ABNORMAL LOW (ref 150–400)
RBC: 3.63 MIL/uL — AB (ref 4.22–5.81)
RDW: 15.7 % — ABNORMAL HIGH (ref 11.5–15.5)
WBC: 7.2 10*3/uL (ref 4.0–10.5)

## 2015-11-24 LAB — PROTIME-INR
INR: 1.89 — AB (ref 0.00–1.49)
Prothrombin Time: 21.6 seconds — ABNORMAL HIGH (ref 11.6–15.2)

## 2015-11-24 LAB — MAGNESIUM: Magnesium: 2.2 mg/dL (ref 1.7–2.4)

## 2015-11-24 MED ORDER — HEPARIN (PORCINE) IN NACL 100-0.45 UNIT/ML-% IJ SOLN: 1050.0000 [IU]/h | INTRAMUSCULAR | Status: DC

## 2015-11-24 MED ORDER — ASPIRIN EC 81 MG PO TBEC: 81.0000 mg | DELAYED_RELEASE_TABLET | Freq: Every day | ORAL | Status: AC

## 2015-11-24 MED ORDER — INSULIN ASPART 100 UNIT/ML ~~LOC~~ SOLN: 0.0000 [IU] | Freq: Three times a day (TID) | SUBCUTANEOUS | Status: DC

## 2015-11-24 MED ORDER — DOBUTAMINE IN D5W 4-5 MG/ML-% IV SOLN: 3.0000 ug/kg/min | INTRAVENOUS | Status: DC

## 2015-11-24 MED ORDER — BUDESONIDE-FORMOTEROL FUMARATE 160-4.5 MCG/ACT IN AERO
2.0000 | INHALATION_SPRAY | Freq: Two times a day (BID) | RESPIRATORY_TRACT | Status: DC
  Administered 2015-11-24: 2 via RESPIRATORY_TRACT
  Filled 2015-11-24 (×2): qty 6

## 2015-11-24 MED ORDER — TIOTROPIUM BROMIDE MONOHYDRATE 18 MCG IN CAPS
18.0000 ug | ORAL_CAPSULE | Freq: Every day | RESPIRATORY_TRACT | Status: DC
  Administered 2015-11-24: 18 ug via RESPIRATORY_TRACT
  Filled 2015-11-24: qty 5

## 2015-11-24 MED ORDER — FUROSEMIDE 40 MG PO TABS: 40.0000 mg | ORAL_TABLET | Freq: Two times a day (BID) | ORAL | Status: DC

## 2015-11-24 MED ORDER — HEPARIN (PORCINE) IN NACL 100-0.45 UNIT/ML-% IJ SOLN: INTRAMUSCULAR | Status: DC

## 2015-11-24 MED ORDER — AMIODARONE IV BOLUS ONLY 150 MG/100ML
150.0000 mg | Freq: Once | INTRAVENOUS | Status: AC
  Administered 2015-11-24: 150 mg via INTRAVENOUS
  Filled 2015-11-24: qty 100

## 2015-11-24 MED ORDER — HEPARIN (PORCINE) IN NACL 100-0.45 UNIT/ML-% IJ SOLN
1200.0000 [IU]/h | INTRAMUSCULAR | Status: DC
  Administered 2015-11-24: 1050 [IU]/h via INTRAVENOUS
  Filled 2015-11-24: qty 250

## 2015-11-24 NOTE — Progress Notes (Signed)
ANTICOAGULATION CONSULT NOTE - Follow-up Consult  Pharmacy Consult for Heparin Indication: atrial fibrillation/mechanical MVR  Allergies  Allergen Reactions  . Acetaminophen-Codeine     REACTION: hives  . Altace [Ramipril]     Other reaction(s): Weal  . Codeine     Other reaction(s): Other (qualifier value) REACTION: hives  . Other     Other reaction(s): Fever  . Sulfonamide Derivatives     REACTION: hives  . Sulfa Antibiotics Rash    Other reaction(s): Weal    Patient Measurements: Height: 5\' 6"  (167.6 cm) Weight: 150 lb 12.7 oz (68.4 kg) IBW/kg (Calculated) : 63.8 Heparin Dosing Weight: 68.4 kg  Vital Signs: Temp: 97.6 F (36.4 C) (12/03 1954) Temp Source: Oral (12/03 1954) BP: 137/65 mmHg (12/03 2200) Pulse Rate: 105 (12/03 1641)  Labs:  Recent Labs  11/22/15 0425  11/22/15 0426 11/22/15 1115 11/23/15 0427 11/24/15 0500 11/24/15 1842 11/24/15 2128  HGB  --   < > 11.4*  --  11.0* 10.4*  --   --   HCT  --   --  35.5*  --  35.9* 33.2*  --   --   PLT  --   --  88*  --  92* 93*  --   --   LABPROT 26.5*  --   --   --  24.6* 21.6*  --   --   INR 2.47*  --   --   --  2.24* 1.89*  --   --   HEPARINUNFRC  --   --   --  0.27*  --   --   --  0.26*  CREATININE 1.10  --   --   --  1.01 1.14 1.10  --   < > = values in this interval not displayed.  Estimated Creatinine Clearance: 51.6 mL/min (by C-G formula based on Cr of 1.1).   Assessment: 74 yo M admitted on 11/20/15 with HF exacerbation. Patient is on chronic Coumadin PTA for afib and mechanical MVR. Coumadin was held on admission for possibility of advanced therapies and heparin was initiated. Heparin held on 12/2 for oozing/bleeding from PICC site, now resolved and heparin will be restarted as INR remains SUBtherapeutic at 1.89. CBC remains low but relatively stable. Heparin level 0.26 (slightly subtherapeutic) on 1050 units/hr. No issues with line or bleeding reported per RN.  Coumadin home dose: 2.5 mg daily  except 5 mg on Wednesday/Saturday  Goal of Therapy:  Heparin level 0.3-0.7 units/ml  INR 2.5-3.5 Monitor platelets by anticoagulation protocol: Yes   Plan:  - Increase heparin at 1200 units/hr - F/u heparin level in 8 hours - Monitor closely for further s/s bleeding  Sherlon Handing, PharmD, BCPS Clinical pharmacist, pager [phone] 11/24/2015 10:13 PM

## 2015-11-24 NOTE — Progress Notes (Signed)
ANTICOAGULATION CONSULT NOTE - Initial Consult  Pharmacy Consult for Heparin Indication: atrial fibrillation/mechanical MVR  Allergies  Allergen Reactions  . Acetaminophen-Codeine     REACTION: hives  . Altace [Ramipril]     Other reaction(s): Weal  . Codeine     Other reaction(s): Other (qualifier value) REACTION: hives  . Other     Other reaction(s): Fever  . Sulfonamide Derivatives     REACTION: hives  . Sulfa Antibiotics Rash    Other reaction(s): Weal    Patient Measurements: Height: 5\' 6"  (167.6 cm) Weight: 150 lb 12.7 oz (68.4 kg) IBW/kg (Calculated) : 63.8 Heparin Dosing Weight: 68.4 kg  Vital Signs: Temp: 98.5 F (36.9 C) (12/03 1135) Temp Source: Oral (12/03 1135) BP: 139/68 mmHg (12/03 1200) Pulse Rate: 89 (12/03 0300)  Labs:  Recent Labs  11/22/15 0425  11/22/15 0426 11/22/15 1115 11/23/15 0427 11/24/15 0500  HGB  --   < > 11.4*  --  11.0* 10.4*  HCT  --   --  35.5*  --  35.9* 33.2*  PLT  --   --  88*  --  92* 93*  LABPROT 26.5*  --   --   --  24.6* 21.6*  INR 2.47*  --   --   --  2.24* 1.89*  HEPARINUNFRC  --   --   --  0.27*  --   --   CREATININE 1.10  --   --   --  1.01 1.14  < > = values in this interval not displayed.  Estimated Creatinine Clearance: 49.7 mL/min (by C-G formula based on Cr of 1.14).   Medical History: Past Medical History  Diagnosis Date  . Chronic atrial fibrillation (HCC)     a. warfarin; b. CHADSVASc 6 (CHF, HTN, age x 2, DM, vascular disease) giving him an estimated annual stroke risk of 9.8%  . Hypertension   . Hyperkalemia   . Chronic systolic CHF (congestive heart failure) ([place])     a. echo 10/2014: EF 30-35%, AK of inf & post myocardium, HK of apical & lat wall, ant & antsept wall best preserved, mitral: mechanical prosthesis was present & well seated, LA moderately dilated, RV systolic fxn mildly reduced, PASP nl  . Coronary artery disease     Cath 2005- patent LIMA-LAD, SVG-OM grafts, moderate MR  .  Prostate cancer ([place]) 04/2009  . Hyperlipidemia   . COPD (chronic obstructive pulmonary disease) ([place])     URI 9/12  . DM type 2 (diabetes mellitus, type 2) ([place])   . PNA (pneumonia) 03/2010  . Glaucoma   . Hard of hearing   . Myocardial infarction ([place])   . Gout   . Adenomatous polyps   . Diverticulosis     left side   . H/O mitral valve prolapse   . Sigmoid diverticulitis     perforated    Medications:  Scheduled:  . antiseptic oral rinse  7 mL Mouth Rinse BID  . Chlorhexidine Gluconate Cloth  6 each Topical Q0600  . cholecalciferol  1,000 Units Oral Daily  . dextromethorphan-guaiFENesin  1 tablet Oral Q12H  . digoxin  0.125 mg Oral Daily  . furosemide  40 mg Oral BID  . insulin aspart  0-15 Units Subcutaneous TID WC  . latanoprost  1 drop Both Eyes QHS  . mupirocin ointment  1 application Nasal BID  . predniSONE  40 mg Oral Q breakfast  . simvastatin  40 mg Oral QHS  . sodium chloride  10-40 mL Intracatheter Q12H  . sodium chloride  3 mL Intravenous Q12H  . tamsulosin  0.4 mg Oral Daily   Infusions:  . DOBUTamine 3 mcg/kg/min (11/24/15 0900)    Assessment: 74 yo M admitted on 11/20/15 with HF exacerbation. Patient is on chronic Coumadin PTA for afib and mechanical MVR. Coumadin was held on admission for possibility of advanced therapies and heparin was initiated. Heparin held on 12/2 for oozing/bleeding from PICC site, now resolving and heparin will be restarted as INR remains SUBtherapeutic at 1.89. CBC remains low but relatively stable.   Coumadin home dose: 2.5 mg daily except 5 mg on Wednesday/Saturday  Goal of Therapy:  Heparin level 0.3-0.7 units/ml  INR 2.5-3.5 Monitor platelets by anticoagulation protocol: Yes   Plan:  - Restart heparin at 1050 units/hr - HL in 8 hours if has not transferred out to [facility] yet - Monitor closely for further s/s bleeding  Ila Landowski K. Velva Harman, PharmD, BCPS, CPP Clinical Pharmacist Pager: [phone] Phone:  [phone] 11/24/2015 2:18 PM

## 2015-11-24 NOTE — Progress Notes (Signed)
Report given to Carlis Stable, RN at [facility]. Waiting for [facility] to pick patient up.

## 2015-11-24 NOTE — Progress Notes (Signed)
Advanced Heart Failure Rounding Note   Subjective:    [name] is a 74 year old gentleman with CAD s/p CABG 1989, PCI of the RCA in 2002, severe COPD, CKD 3, s/p mechanical mitral valve replacement in 2007, chronic atrial fibrillation, diabetes, hypertension, hyperlipidemia, PNA in 03/2010, h/o diverticular disease s/p colon resection with ostomy admitted on 11/29 for worsening HF and renal failure in the setting of possible low-gradient severe AS and EF 25%  Has diuresed well on IV lasix and dobutamine. Dobutamine echo on 12/1 shows mild to moderate AS Mean gradient 19-21 mmHG on dobutamine 20. With persistent severe LV (and RV) dysfunction.   Remains on dobutamine 3. Feeling better. No dyspnea. + cough. Gout pain improved with oral prednisone. PICC no longer bleeding.  INR 1.9. CVP 9.   Has been seen by Dr. Prescott [name] for possible VAD evaluation and felt to be very high risk due to previous sternotomy, mechanical MVR and RV failure.  Suggested possible transfer to [facility].    Objective:   Weight Range:  Vital Signs:   Temp:  [97.7 F (36.5 C)-98.6 F (37 C)] 98.5 F (36.9 C) (12/03 1135) Pulse Rate:  [89-99] 89 (12/03 0300) Resp:  [17-27] 18 (12/03 1200) BP: (98-139)/(49-87) 139/68 mmHg (12/03 1200) SpO2:  [94 %-100 %] 97 % (12/03 1200) Weight:  [68.4 kg (150 lb 12.7 oz)] 68.4 kg (150 lb 12.7 oz) (12/03 0653) Last BM Date: 11/24/15  Weight change: Filed Weights   11/22/15 0421 11/23/15 0500 11/24/15 0653  Weight: 66.5 kg (146 lb 9.7 oz) 66.7 kg (147 lb 0.8 oz) 68.4 kg (150 lb 12.7 oz)    Intake/Output:   Intake/Output Summary (Last 24 hours) at 11/24/15 1310 Last data filed at 11/24/15 1257  Gross per 24 hour  Intake  139.2 ml  Output   1600 ml  Net -1460.8 ml     Physical Exam: General: Elderly. Sitting in chair Chronically ill appearing. No resp difficulty HEENT: normal Neck: supple. JVP 8-9 Carotids 1+ bilaterally; no bruits. No lymphadenopathy or thryomegaly  appreciated. Cor: PMI normal IRR IRR. 2/6 AS with reduced s2 mechanical s1  Lungs: decreased throughout otherwise clear Abdomen: soft, nontender, nondistended. No hepatosplenomegaly. N+ bruit Good bowel sounds. +colostomy LLQ Extremities: no cyanosis, clubbing, rash, no  Edema  RUE PICC with bandage   Neuro: alert & orientedx3, cranial nerves grossly intact. Moves all 4 extremities w/o difficulty. Affect pleasant.  Telemetry: AF  Labs: Basic Metabolic Panel:  Recent Labs Lab 11/21/15 0320 11/22/15 0420 11/22/15 0425 11/23/15 0427 11/24/15 0500  NA 142  --  142 141 140  K 3.6  --  3.1* 4.2 4.3  CL 101  --  96* 96* 101  CO2 32  --  38* 36* 35*  GLUCOSE 94  --  109* 120* 148*  BUN 26*  --  18 14 26*  CREATININE 1.22  --  1.10 1.01 1.14  CALCIUM 9.2  --  8.9 9.0 8.6*  MG  --  1.3*  --  2.6*  --     Liver Function Tests: No results for input(s): AST, ALT, ALKPHOS, BILITOT, PROT, ALBUMIN in the last 168 hours. No results for input(s): LIPASE, AMYLASE in the last 168 hours. No results for input(s): AMMONIA in the last 168 hours.  CBC:  Recent Labs Lab 11/20/15 1705 11/22/15 0426 11/23/15 0427 11/24/15 0500  WBC 3.6* 4.6 5.4 7.2  HGB 11.4* 11.4* 11.0* 10.4*  HCT 36.1* 35.5* 35.9* 33.2*  MCV  92.8 91.7 93.5 91.5  PLT 91* 88* 92* 93*    Cardiac Enzymes: No results for input(s): CKTOTAL, CKMB, CKMBINDEX, TROPONINI in the last 168 hours.  BNP: BNP (last 3 results) No results for input(s): BNP in the last 8760 hours.  ProBNP (last 3 results)  Recent Labs  11/13/15 1559  PROBNP 1778.0*      Other results:  Imaging: No results found.   Medications:     Scheduled Medications: . antiseptic oral rinse  7 mL Mouth Rinse BID  . Chlorhexidine Gluconate Cloth  6 each Topical Q0600  . cholecalciferol  1,000 Units Oral Daily  . dextromethorphan-guaiFENesin  1 tablet Oral Q12H  . digoxin  0.125 mg Oral Daily  . furosemide  40 mg Oral BID  . insulin aspart   0-15 Units Subcutaneous TID WC  . latanoprost  1 drop Both Eyes QHS  . mupirocin ointment  1 application Nasal BID  . predniSONE  40 mg Oral Q breakfast  . simvastatin  40 mg Oral QHS  . sodium chloride  10-40 mL Intracatheter Q12H  . sodium chloride  3 mL Intravenous Q12H  . tamsulosin  0.4 mg Oral Daily    Infusions: . DOBUTamine 3 mcg/kg/min (11/24/15 0900)    PRN Medications: sodium chloride, acetaminophen, ondansetron (ZOFRAN) IV, sodium chloride, sodium chloride   Assessment:   1. Acute on chronic systolic HF with biventricular failure     EF 25% 2. Probable severe low gradient AS 3. Severe COPD 4. CAD s/p CABG 1989 5. S/p mechanical MV replacement via lateral thoracotomy at [facility] (Dr. Evelina Dun) in 2007 6. Acute on chronic renal failure, stage III-IV 7. DM2 8 Chronic AF 9. Hypokalemia 10. + acute gout - improved with oral prednisone  Plan/Discussion:     He has responded well to IV lasix and dobutamine 3. Volume status and renal function improved.   Dobutamine echo showed that AS likely mild to moderate at worst (mean gradient 19-65mm HG on dobutamine 20). Reviewed extensively with Dr. Burt Knack and feel main issues is biventricular dysfunction and not severe AS. He has also been seen by Dr. Prescott [name] and felt to be very high risk for VAD. Suggested transfer to [facility].  Improved today. Long talk with him and his family about options including continued medical therapy, home dobutamine or consideration for high-risk VAD at [facility]. They have chosen the latter. Will have VAD patient meet with him here today and contact [facility] regarding possible transfer.   Will also likely need R/L HC with coronary/graft angio to evaluate possible reasons for worsening LV function. Will continue to hold coumadin and start IV heparin. Watch PICC line for recurrent bleeding. .   Metformin on hold due to low-output HF. Covered with SSI.    Length of Stay: 4   [name], [name]  MD  11/24/2015,  1:10 PM  Advanced Heart Failure Team Pager [phone] (M-F; [place])  Please contact [facility] for night-coverage after hours (4p -7a ) and weekends on [email or web address]

## 2015-11-24 NOTE — Progress Notes (Signed)
Notified Dr Haroldine Laws made aware of patients increased HR of 130-140's. New orders received.   Loni Muse, RN

## 2015-11-24 NOTE — Discharge Summary (Signed)
ADVANCED HF TEAM DISCHARGE/TRANSFER SUMMARY   Patient ID: [name] MRN: [id number] DOB/AGE: 1941/04/11 74 y.o.  Admit date: 11/20/2015 Discharge date: 11/24/2015  Primary Discharge Diagnosis 1. Acute on chronic systolic HF with biventricular failure  -ischemic CM EF 25% 2. Mild to moderate aortic stenosis  3. Severe COPD 4. CAD s/p CABG 1989 5. S/p mechanical MV replacement via lateral thoracotomy at [facility] (Dr. Evelina Dun) in 2007 6. Acute renal failure, stage III-IV - now resolved 7. DM2 8 Chronic AF 9. Hypokalemia 10. Acute gout - improved with oral prednisone 11. h/o diverticular disease s/p colon resection with ostomy admitted    Hospital Course:   [name] is a 74 year old gentleman with CAD s/p CABG 1989 & PCI of the RCA in 2002, severe COPD, CKD 3, s/p mechanical mitral valve replacement in 2007 with Dr. Evelina Dun via lateral throacotomy, chronic atrial fibrillation, diabetes, hypertension, hyperlipidemia, h/o diverticular disease s/p colon resection with ostomy who was admitted on 11/29 for worsening HF and renal failure in the setting of possible low-gradient severe AS and EF 25%.  He had been doing well until early this fall. Active without too much problem. Over past month or two has had progressive HF symptoms. Presented to see Dr. Rockey Situ at [facility] earlier in the week with Class IV symptoms, volume overload and worsening renal function. Echo with EF 25-30% (down from previous 35-40%) with possible low-gradient severe AS. Referred to HF clinic and admitted on 11/29.   PICC line placed and started on dobutamine and IV lasix with very good response. Diuresed well. Mixed venous sat 60-70% on dobutamine 3. Underwent dobutamine echo on 12/1 which showed mild to moderate AS with mean gradient 19-21 mmHG on dobutamine 20. With persistent severe LV (and RV) dysfunction. Seen by Dr. Burt Knack from the TAVR team and major issue felt to be severe biventricular dysfunction and not AS.    Subsequently seen by Dr. Prescott Gum for possible VAD evaluation and felt to be very high risk due to previous sternotomy, mechanical MVR and RV failure. Suggested possible transfer to [facility].  Possible options including continued medical therapy, home dobutamine or consideration for high-risk VAD at [place] discussed with [name] and his family (including his daughter who is an ER nurse at [employer]). They have chosen to go to [facility].  While in hospital improved steadily. Coumadin held for possible R/L cath. Iv heparin started when INR 2.4 but developed bleeding at PICC site so heparin held. Restarted today with INR < 2.0. Also developed severe gout in bilateral ankles with diuresis and treated with oral prednisone for 2 days.  Case discussed with Dr. Lutricia Feil of the transplant/VAD team at [facility] who accepted patient for evaluation.    Discharge Info: Blood pressure 139/68, pulse 89, temperature 98.5 F (36.9 C), temperature source Oral, resp. rate 18, height 5\' 6"  (1.676 m), weight 68.4 kg (150 lb 12.7 oz), SpO2 97 %.  Physical Exam: General: Elderly. Sitting in chair. No resp difficulty HEENT: normal Neck: supple. JVP 8-9 Carotids 1+ bilaterally; no bruits. No lymphadenopathy or thryomegaly appreciated. Cor: PMI normal IRR IRR. 2/6 AS with reduced s2 mechanical s1  Lungs: decreased throughout otherwise clear Abdomen: soft, nontender, nondistended. No hepatosplenomegaly. N+ bruit Good bowel sounds. +colostomy LLQ Extremities: no cyanosis, clubbing, rash, no Edema RUE PICC with bandage  Neuro: alert & orientedx3, cranial nerves grossly intact. Moves all 4 extremities w/o difficulty. Affect pleasant.   Weight change: 1.7 kg (3 lb 12 oz) Results for orders placed  or performed during the hospital encounter of 11/20/15 (from the past 24 hour(s))  Glucose, capillary     Status: Abnormal   Collection Time: 11/23/15  4:28 PM  Result Value Ref Range   Glucose-Capillary 323 (H) 65 - 99 mg/dL  Glucose,  capillary     Status: Abnormal   Collection Time: 11/23/15  9:51 PM  Result Value Ref Range   Glucose-Capillary 315 (H) 65 - 99 mg/dL   Comment 1 Capillary Specimen   Basic metabolic panel     Status: Abnormal   Collection Time: 11/24/15  5:00 AM  Result Value Ref Range   Sodium 140 135 - 145 mmol/L   Potassium 4.3 3.5 - 5.1 mmol/L   Chloride 101 101 - 111 mmol/L   CO2 35 (H) 22 - 32 mmol/L   Glucose, Bld 148 (H) 65 - 99 mg/dL   BUN 26 (H) 6 - 20 mg/dL   Creatinine, Ser 1.14 0.61 - 1.24 mg/dL   Calcium 8.6 (L) 8.9 - 10.3 mg/dL   GFR calc non Af Amer >60 >60 mL/min   GFR calc Af Amer >60 >60 mL/min   Anion gap 4 (L) 5 - 15  Protime-INR     Status: Abnormal   Collection Time: 11/24/15  5:00 AM  Result Value Ref Range   Prothrombin Time 21.6 (H) 11.6 - 15.2 seconds   INR 1.89 (H) 0.00 - 1.49  CBC     Status: Abnormal   Collection Time: 11/24/15  5:00 AM  Result Value Ref Range   WBC 7.2 4.0 - 10.5 K/uL   RBC 3.63 (L) 4.22 - 5.81 MIL/uL   Hemoglobin 10.4 (L) 13.0 - 17.0 g/dL   HCT 33.2 (L) 39.0 - 52.0 %   MCV 91.5 78.0 - 100.0 fL   MCH 28.7 26.0 - 34.0 pg   MCHC 31.3 30.0 - 36.0 g/dL   RDW 15.7 (H) 11.5 - 15.5 %   Platelets 93 (L) 150 - 400 K/uL  Glucose, capillary     Status: Abnormal   Collection Time: 11/24/15  7:53 AM  Result Value Ref Range   Glucose-Capillary 133 (H) 65 - 99 mg/dL   Comment 1 Capillary Specimen   Carboxyhemoglobin     Status: None (Preliminary result)   Collection Time: 11/24/15  8:25 AM  Result Value Ref Range   Total hemoglobin PENDING 13.5 - 18.0 g/dL   O2 Saturation 65.9 %   Carboxyhemoglobin PENDING 0.5 - 1.5 %   Methemoglobin PENDING 0.0 - 1.5 %   Total oxygen content PENDING 15.0 - 23.0 mL/dL  Glucose, capillary     Status: Abnormal   Collection Time: 11/24/15 11:47 AM  Result Value Ref Range   Glucose-Capillary 182 (H) 65 - 99 mg/dL   Comment 1 Capillary Specimen      Discharge Medications:   Medication List    STOP taking these  medications        carvedilol 12.5 MG tablet  Commonly known as:  COREG     losartan 100 MG tablet  Commonly known as:  COZAAR     metFORMIN 500 MG tablet  Commonly known as:  GLUCOPHAGE     PRESERVISION/LUTEIN PO     warfarin 2.5 MG tablet  Commonly known as:  COUMADIN      TAKE these medications        aspirin EC 81 MG tablet  Take 1 tablet (81 mg total) by mouth daily.     dextromethorphan-guaiFENesin 30-600 MG  12hr tablet  Commonly known as:  MUCINEX DM  Take 1 tablet by mouth every 12 (twelve) hours.     digoxin 0.125 MG tablet  Commonly known as:  DIGOX  Take 1 tablet by mouth  daily     DOBUTamine 4-5 MG/ML-% infusion  Commonly known as:  DOBUTREX  Inject 205.2 mcg/min into the vein continuous. 3 mcg/kg/min     furosemide 40 MG tablet  Commonly known as:  LASIX  Take 1 tablet (40 mg total) by mouth 2 (two) times daily.     heparin 100-0.45 UNIT/ML-% infusion  IV HEPARIN PER PHARMACY     insulin aspart 100 UNIT/ML injection  Commonly known as:  novoLOG  Inject 0-15 Units into the skin 3 (three) times daily with meals. Per sliding scale in hospital     latanoprost 0.005 % ophthalmic solution  Commonly known as:  XALATAN  Place 1 drop into both eyes at bedtime.     simvastatin 40 MG tablet  Commonly known as:  ZOCOR  Take 40 mg by mouth at bedtime.     SPIRIVA RESPIMAT 2.5 MCG/ACT Aers  Generic drug:  Tiotropium Bromide Monohydrate  Use 2 puffs daily     tamsulosin 0.4 MG Caps capsule  Commonly known as:  FLOMAX  Take 0.4 mg by mouth daily.     Vitamin D3 1000 UNITS Caps  Take 1 capsule by mouth daily.        Time spent with patient to include physician time: 45 minutes Signed:  Glori Bickers, MD 11/24/2015, 2:23 PM

## 2015-11-25 DIAGNOSIS — I130 Hypertensive heart and chronic kidney disease with heart failure and stage 1 through stage 4 chronic kidney disease, or unspecified chronic kidney disease: Secondary | ICD-10-CM | POA: Diagnosis present

## 2015-11-25 DIAGNOSIS — Z933 Colostomy status: Secondary | ICD-10-CM | POA: Diagnosis not present

## 2015-11-25 DIAGNOSIS — N189 Chronic kidney disease, unspecified: Secondary | ICD-10-CM | POA: Diagnosis present

## 2015-11-25 DIAGNOSIS — R69 Illness, unspecified: Secondary | ICD-10-CM | POA: Diagnosis not present

## 2015-11-25 DIAGNOSIS — I517 Cardiomegaly: Secondary | ICD-10-CM | POA: Diagnosis not present

## 2015-11-25 DIAGNOSIS — I6523 Occlusion and stenosis of bilateral carotid arteries: Secondary | ICD-10-CM | POA: Diagnosis not present

## 2015-11-25 DIAGNOSIS — J441 Chronic obstructive pulmonary disease with (acute) exacerbation: Secondary | ICD-10-CM | POA: Diagnosis not present

## 2015-11-25 DIAGNOSIS — J9809 Other diseases of bronchus, not elsewhere classified: Secondary | ICD-10-CM | POA: Diagnosis present

## 2015-11-25 DIAGNOSIS — Z87891 Personal history of nicotine dependence: Secondary | ICD-10-CM | POA: Diagnosis not present

## 2015-11-25 DIAGNOSIS — Z951 Presence of aortocoronary bypass graft: Secondary | ICD-10-CM | POA: Diagnosis not present

## 2015-11-25 DIAGNOSIS — Z9981 Dependence on supplemental oxygen: Secondary | ICD-10-CM | POA: Diagnosis not present

## 2015-11-25 DIAGNOSIS — J90 Pleural effusion, not elsewhere classified: Secondary | ICD-10-CM | POA: Diagnosis not present

## 2015-11-25 DIAGNOSIS — I509 Heart failure, unspecified: Secondary | ICD-10-CM | POA: Diagnosis not present

## 2015-11-25 DIAGNOSIS — J449 Chronic obstructive pulmonary disease, unspecified: Secondary | ICD-10-CM | POA: Diagnosis present

## 2015-11-25 DIAGNOSIS — I2589 Other forms of chronic ischemic heart disease: Secondary | ICD-10-CM | POA: Diagnosis not present

## 2015-11-25 DIAGNOSIS — E1122 Type 2 diabetes mellitus with diabetic chronic kidney disease: Secondary | ICD-10-CM | POA: Diagnosis present

## 2015-11-25 DIAGNOSIS — E7800 Pure hypercholesterolemia, unspecified: Secondary | ICD-10-CM | POA: Diagnosis present

## 2015-11-25 DIAGNOSIS — I2510 Atherosclerotic heart disease of native coronary artery without angina pectoris: Secondary | ICD-10-CM | POA: Diagnosis not present

## 2015-11-25 DIAGNOSIS — Z008 Encounter for other general examination: Secondary | ICD-10-CM | POA: Diagnosis not present

## 2015-11-25 DIAGNOSIS — I272 Other secondary pulmonary hypertension: Secondary | ICD-10-CM | POA: Diagnosis present

## 2015-11-25 DIAGNOSIS — I4891 Unspecified atrial fibrillation: Secondary | ICD-10-CM | POA: Diagnosis not present

## 2015-11-25 DIAGNOSIS — Z952 Presence of prosthetic heart valve: Secondary | ICD-10-CM | POA: Diagnosis not present

## 2015-11-25 DIAGNOSIS — J811 Chronic pulmonary edema: Secondary | ICD-10-CM | POA: Diagnosis not present

## 2015-11-25 DIAGNOSIS — R918 Other nonspecific abnormal finding of lung field: Secondary | ICD-10-CM | POA: Diagnosis not present

## 2015-11-25 DIAGNOSIS — J9811 Atelectasis: Secondary | ICD-10-CM | POA: Diagnosis not present

## 2015-11-25 DIAGNOSIS — M79671 Pain in right foot: Secondary | ICD-10-CM | POA: Diagnosis not present

## 2015-11-25 DIAGNOSIS — Z6824 Body mass index (BMI) 24.0-24.9, adult: Secondary | ICD-10-CM | POA: Diagnosis not present

## 2015-11-25 DIAGNOSIS — I482 Chronic atrial fibrillation: Secondary | ICD-10-CM | POA: Diagnosis present

## 2015-11-25 DIAGNOSIS — E43 Unspecified severe protein-calorie malnutrition: Secondary | ICD-10-CM | POA: Diagnosis present

## 2015-11-25 DIAGNOSIS — Z7901 Long term (current) use of anticoagulants: Secondary | ICD-10-CM | POA: Diagnosis not present

## 2015-11-25 DIAGNOSIS — C61 Malignant neoplasm of prostate: Secondary | ICD-10-CM | POA: Diagnosis present

## 2015-11-25 DIAGNOSIS — I2582 Chronic total occlusion of coronary artery: Secondary | ICD-10-CM | POA: Diagnosis not present

## 2015-11-25 DIAGNOSIS — F09 Unspecified mental disorder due to known physiological condition: Secondary | ICD-10-CM | POA: Diagnosis not present

## 2015-11-25 DIAGNOSIS — M109 Gout, unspecified: Secondary | ICD-10-CM | POA: Diagnosis present

## 2015-11-25 DIAGNOSIS — I5023 Acute on chronic systolic (congestive) heart failure: Secondary | ICD-10-CM | POA: Diagnosis present

## 2015-11-25 DIAGNOSIS — R443 Hallucinations, unspecified: Secondary | ICD-10-CM | POA: Diagnosis not present

## 2015-11-25 DIAGNOSIS — R042 Hemoptysis: Secondary | ICD-10-CM | POA: Diagnosis not present

## 2015-11-27 LAB — CARBOXYHEMOGLOBIN
Carboxyhemoglobin: 1.6 % — ABNORMAL HIGH (ref 0.5–1.5)
METHEMOGLOBIN: 0.9 % (ref 0.0–1.5)
O2 Saturation: 65.9 %
Total hemoglobin: 10.4 g/dL — ABNORMAL LOW (ref 13.5–18.0)

## 2015-11-29 ENCOUNTER — Ambulatory Visit: Payer: Medicare Other | Admitting: Cardiovascular Disease

## 2015-12-04 ENCOUNTER — Ambulatory Visit: Payer: Medicare Other | Admitting: Pulmonary Disease

## 2015-12-10 ENCOUNTER — Ambulatory Visit (INDEPENDENT_AMBULATORY_CARE_PROVIDER_SITE_OTHER): Payer: Medicare Other

## 2015-12-10 DIAGNOSIS — I482 Chronic atrial fibrillation, unspecified: Secondary | ICD-10-CM

## 2015-12-10 DIAGNOSIS — Z7901 Long term (current) use of anticoagulants: Secondary | ICD-10-CM

## 2015-12-10 DIAGNOSIS — I059 Rheumatic mitral valve disease, unspecified: Secondary | ICD-10-CM

## 2015-12-10 DIAGNOSIS — Z9889 Other specified postprocedural states: Secondary | ICD-10-CM

## 2015-12-10 DIAGNOSIS — I4891 Unspecified atrial fibrillation: Secondary | ICD-10-CM

## 2015-12-10 DIAGNOSIS — Z5181 Encounter for therapeutic drug level monitoring: Secondary | ICD-10-CM

## 2015-12-10 LAB — POCT INR: INR: 2.3

## 2015-12-11 ENCOUNTER — Other Ambulatory Visit
Admission: RE | Admit: 2015-12-11 | Discharge: 2015-12-11 | Disposition: A | Discharge status: Home / Self Care | Payer: Medicare Other | Source: Ambulatory Visit | Attending: Cardiovascular Disease | Admitting: Cardiovascular Disease

## 2015-12-11 ENCOUNTER — Encounter: Payer: Self-pay | Admitting: Cardiovascular Disease

## 2015-12-11 ENCOUNTER — Ambulatory Visit (INDEPENDENT_AMBULATORY_CARE_PROVIDER_SITE_OTHER): Payer: Medicare Other | Admitting: Cardiovascular Disease

## 2015-12-11 VITALS — BP 84/44 | HR 83 | Ht 66.0 in | Wt 151.5 lb

## 2015-12-11 DIAGNOSIS — I5023 Acute on chronic systolic (congestive) heart failure: Secondary | ICD-10-CM | POA: Insufficient documentation

## 2015-12-11 DIAGNOSIS — I110 Hypertensive heart disease with heart failure: Secondary | ICD-10-CM | POA: Insufficient documentation

## 2015-12-11 DIAGNOSIS — I350 Nonrheumatic aortic (valve) stenosis: Secondary | ICD-10-CM | POA: Diagnosis not present

## 2015-12-11 DIAGNOSIS — E785 Hyperlipidemia, unspecified: Secondary | ICD-10-CM

## 2015-12-11 DIAGNOSIS — I482 Chronic atrial fibrillation, unspecified: Secondary | ICD-10-CM

## 2015-12-11 DIAGNOSIS — I5022 Chronic systolic (congestive) heart failure: Secondary | ICD-10-CM

## 2015-12-11 DIAGNOSIS — Z9889 Other specified postprocedural states: Secondary | ICD-10-CM

## 2015-12-11 DIAGNOSIS — I10 Essential (primary) hypertension: Secondary | ICD-10-CM | POA: Diagnosis not present

## 2015-12-11 DIAGNOSIS — I25810 Atherosclerosis of coronary artery bypass graft(s) without angina pectoris: Secondary | ICD-10-CM | POA: Diagnosis not present

## 2015-12-11 DIAGNOSIS — E118 Type 2 diabetes mellitus with unspecified complications: Secondary | ICD-10-CM

## 2015-12-11 LAB — BASIC METABOLIC PANEL
ANION GAP: 10 (ref 5–15)
BUN: 72 mg/dL — AB (ref 6–20)
CO2: 30 mmol/L (ref 22–32)
Calcium: 9.1 mg/dL (ref 8.9–10.3)
Chloride: 90 mmol/L — ABNORMAL LOW (ref 101–111)
Creatinine, Ser: 1.91 mg/dL — ABNORMAL HIGH (ref 0.61–1.24)
GFR calc Af Amer: 38 mL/min — ABNORMAL LOW (ref >60)
GFR calc non Af Amer: 32 mL/min — ABNORMAL LOW (ref >60)
GLUCOSE: 138 mg/dL — AB (ref 65–99)
POTASSIUM: 4.3 mmol/L (ref 3.5–5.1)
Sodium: 130 mmol/L — ABNORMAL LOW (ref 135–145)

## 2015-12-11 LAB — BRAIN NATRIURETIC PEPTIDE: B Natriuretic Peptide: 676 pg/mL — ABNORMAL HIGH (ref 0.0–100.0)

## 2015-12-11 NOTE — Assessment & Plan Note (Signed)
Blood pressure on the low side,Denies any orthostasis symptoms Family is happy to let [facility] manage his medications. No changes made today

## 2015-12-11 NOTE — Assessment & Plan Note (Signed)
Cholesterol is at goal on the current lipid regimen. No changes to the medications were made.  

## 2015-12-11 NOTE — Assessment & Plan Note (Signed)
Echocardiogram at [facility], well-seated mechanical valve, tolerating warfarin, denies hemoptysis

## 2015-12-11 NOTE — Assessment & Plan Note (Signed)
Persistent atrial fibrillation, tolerating anticoagulation Family reports significant hemoptysis while in the hospital at [facility], seen by pulmonary at that time

## 2015-12-11 NOTE — Assessment & Plan Note (Signed)
Recent cardiac catheterization done at [facility], patent grafts Complete report not available in the " Care Everywhere"  system

## 2015-12-11 NOTE — Progress Notes (Signed)
Patient ID: [name], male    DOB: [birth year], 74 y.o.   MRN: [id number]  HPI Comments: [name] is a very pleasant 74 year old gentleman with a past medical history of coronary artery disease, bypass surgery with a left internal mammary to the LAD and saphenous vein graft to the OM in 1989, PCI of the RCA in 2002, severe COPD, mitral valve replacement with prosthetic valve on Coumadin in 2007, history of chronic atrial fibrillation, diabetes, hypertension, hyperlipidemia, PNA in 03/2010,  History of colon surgery with ostomy for diverticuli He presents for follow-up after recent hospital admission Recently admitted to [facility] end of November 2016 for acute on chronic systolic CHF Started on dobutamine with significant improvement of his CHF Transferred to [facility] on 11/25/2015 for LVAD evaluation, underwent thorough testing including cardiac Physician, right heart catheterization 2, CT scan of the chest, PFTs, etc. Daughter reports dobutamine was initially stopped and then restarted. 20 pound weight loss over the course of his 2 hospital visits  In follow-up today, his weight is 144.6 pounds at home yesterday, 145.8 today He was taking torsemide 60 mg twice a day at [facility], decreased to 40 mg twice a day at discharge  EKG on today's visit shows atrial fibrillation with rate 83 bpm, right bundle branch block, PVCs   other past medical history hospitalization 02/19/15 for bronchitis requiring IV antibiotics.   Symptoms improved without prednisone.   hospital in September 04 2011 with atrial fibrillation with RVR, chest pain, bronchitis, COPD exacerbation. He was treated with steroids, antibiotics, rate control for his atrial fibrillation with improvement of his symptoms.    hospital January 06 2012 with shortness of breath and congestion felt to have COPD exacerbation,  nebulizers, steroids. He suffered a perforated bowel with air under his diaphragm and was transferred to [facility] and within 48 hours, had surgery performed. He presents today with an ostomy. 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 He had a 24 pound weight loss through this procedure, open abdominal wall wound healing by secondary intention.  He was discharged with underlying anemia, hematocrit 27 INR of 4  Repeat echocardiogram for shortness of breath  starting in October showed a decline in his ejection fraction to 20-30%, worsening inferior, posterior and lateral wall hypokinesis (inferior and posterior wall are almost akinetic). Prior ejection fraction 40-45%.   stress test late 2013 showing large region of scar in the inferior and inferolateral wall consistent with prior MI, no ischemia        Outpatient Encounter Prescriptions as of 12/11/2015  Medication Sig  . aspirin EC 81 MG tablet Take 1 tablet (81 mg total) by mouth daily.  . budesonide-formoterol (SYMBICORT) 160-4.5 MCG/ACT inhaler Inhale 2 puffs into the lungs 2 (two) times daily.  . Cholecalciferol (VITAMIN D) 2000 UNITS tablet Take 2,000 Units by mouth daily.  . digoxin (DIGOX) 0.125 MG tablet Take 1 tablet by mouth  daily  . latanoprost (XALATAN) 0.005 % ophthalmic solution Place 1 drop into both eyes at bedtime.   Marland Kitchen losartan (COZAAR) 100 MG tablet Take 100 mg by mouth daily.  . metFORMIN (GLUCOPHAGE) 500 MG tablet Take 1,000 mg by mouth 2 (two) times daily with a meal.  . metoprolol succinate (TOPROL-XL) 50 MG 24 hr tablet Take 50 mg by mouth daily. Take with or immediately following a meal.  . simvastatin (ZOCOR) 40 MG tablet Take 40 mg by mouth  at bedtime.   Marland Kitchen SPIRIVA RESPIMAT 2.5 MCG/ACT AERS Use 2 puffs daily  . spironolactone (ALDACTONE) 25 MG tablet Take 25 mg by mouth daily.  . Tamsulosin HCl (FLOMAX) 0.4 MG CAPS Take 0.4 mg by mouth daily.   Marland Kitchen torsemide (DEMADEX) 20 MG tablet Take 40 mg by mouth 2 (two) times daily.  Marland Kitchen  warfarin (COUMADIN) 2.5 MG tablet Take 2.5 mg by mouth as directed.  . [DISCONTINUED] Cholecalciferol (VITAMIN D3) 1000 UNITS CAPS Take 1 capsule by mouth daily. Reported on 12/11/2015  . [DISCONTINUED] dextromethorphan-guaiFENesin (MUCINEX DM) 30-600 MG per 12 hr tablet Take 1 tablet by mouth every 12 (twelve) hours. Reported on 12/11/2015  . [DISCONTINUED] DOBUTamine (DOBUTREX) 4-5 MG/ML-% infusion Inject 205.2 mcg/min into the vein continuous. 3 mcg/kg/min (Patient not taking: Reported on 12/11/2015)  . [DISCONTINUED] furosemide (LASIX) 40 MG tablet Take 1 tablet (40 mg total) by mouth 2 (two) times daily. (Patient not taking: Reported on 12/11/2015)  . [DISCONTINUED] heparin 100-0.45 UNIT/ML-% infusion IV HEPARIN PER PHARMACY (Patient not taking: Reported on 12/11/2015)  . [DISCONTINUED] insulin aspart (NOVOLOG) 100 UNIT/ML injection Inject 0-15 Units into the skin 3 (three) times daily with meals. Per sliding scale in hospital (Patient not taking: Reported on 12/11/2015)   No facility-administered encounter medications on file as of 12/11/2015.   Social history Social History   Social History  . Marital Status: Married    Spouse Name: N/A  . Number of Children: 3  . Years of Education: N/A   Occupational History  . retired Airline pilot    Social History Main Topics  . Smoking status: Former Smoker -- 4.00 packs/day for 23 years    Types: Cigarettes    Quit date: 12/23/1987  . Smokeless tobacco: Never Used  . Alcohol Use: No  . Drug Use: No  . Sexual Activity: Not on file   Other Topics Concern  . Not on file   Social History Narrative   Lives in [place], [place] with wife.      Review of Systems  Respiratory: Positive for shortness of breath.   Cardiovascular: Negative.   Gastrointestinal: Negative.   Musculoskeletal: Negative.   Neurological: Positive for weakness.  Psychiatric/Behavioral: Positive for dysphoric mood.  All other systems reviewed and are  negative.  BP 84/44 mmHg  Pulse 83  Ht 5\' 6"  (1.676 m)  Wt 151 lb 8 oz (68.72 kg)  BMI 24.46 kg/m2  Physical Exam  Constitutional: He is oriented to person, place, and time. He appears well-developed and well-nourished.  HENT:  Head: Normocephalic.  Nose: Nose normal.  Mouth/Throat: Oropharynx is clear and moist.  Eyes: Conjunctivae are normal. Pupils are equal, round, and reactive to light.  Neck: Normal range of motion. Neck supple. No JVD present.  Cardiovascular: Normal rate, S1 normal, S2 normal and intact distal pulses.  An irregularly irregular rhythm present. Exam reveals no gallop and no friction rub.   No murmur heard. Pulmonary/Chest: Effort normal. No respiratory distress. He has decreased breath sounds. He has no wheezes. He has no rales. He exhibits no tenderness.  Abdominal: Soft. Bowel sounds are normal. He exhibits no distension. There is no tenderness.  Musculoskeletal: Normal range of motion. He exhibits no edema or tenderness.  Lymphadenopathy:    He has no cervical adenopathy.  Neurological: He is alert and oriented to person, place, and time. Coordination normal.  Skin: Skin is warm and dry. No rash noted. No erythema.  Psychiatric: He has a normal mood and affect. His behavior  is normal. Judgment and thought content normal.      Assessment and Plan   Nursing note and vitals reviewed.

## 2015-12-11 NOTE — Assessment & Plan Note (Signed)
Long hospital course at [facility] with transferred to [facility] Discharge 2 days ago, baseline weight 145 pounds After dobutamine assisted diuresis, dramatic improvement of his shortness of breath

## 2015-12-11 NOTE — Patient Instructions (Signed)
You are doing well. No medication changes were made.  We will will check labs today: BMP, BNP  Please call us if you have new issues that need to be addressed before your next appt.

## 2015-12-11 NOTE — Assessment & Plan Note (Signed)
Stressed importance of aggressive diabetes control

## 2015-12-12 NOTE — Telephone Encounter (Signed)
This encounter was created in error - please disregard.

## 2015-12-13 DIAGNOSIS — Z79899 Other long term (current) drug therapy: Secondary | ICD-10-CM | POA: Diagnosis not present

## 2015-12-13 DIAGNOSIS — Z9889 Other specified postprocedural states: Secondary | ICD-10-CM | POA: Diagnosis not present

## 2015-12-13 DIAGNOSIS — N179 Acute kidney failure, unspecified: Secondary | ICD-10-CM | POA: Diagnosis not present

## 2015-12-13 DIAGNOSIS — Z7901 Long term (current) use of anticoagulants: Secondary | ICD-10-CM | POA: Diagnosis not present

## 2015-12-13 DIAGNOSIS — J9811 Atelectasis: Secondary | ICD-10-CM | POA: Diagnosis not present

## 2015-12-13 DIAGNOSIS — N189 Chronic kidney disease, unspecified: Secondary | ICD-10-CM | POA: Diagnosis not present

## 2015-12-13 DIAGNOSIS — I509 Heart failure, unspecified: Secondary | ICD-10-CM | POA: Diagnosis not present

## 2015-12-13 DIAGNOSIS — E1122 Type 2 diabetes mellitus with diabetic chronic kidney disease: Secondary | ICD-10-CM | POA: Diagnosis not present

## 2015-12-13 DIAGNOSIS — Z7982 Long term (current) use of aspirin: Secondary | ICD-10-CM | POA: Diagnosis not present

## 2015-12-13 DIAGNOSIS — I517 Cardiomegaly: Secondary | ICD-10-CM | POA: Diagnosis not present

## 2015-12-13 DIAGNOSIS — R0600 Dyspnea, unspecified: Secondary | ICD-10-CM | POA: Diagnosis not present

## 2015-12-13 DIAGNOSIS — R05 Cough: Secondary | ICD-10-CM | POA: Diagnosis not present

## 2015-12-13 DIAGNOSIS — R600 Localized edema: Secondary | ICD-10-CM | POA: Diagnosis not present

## 2015-12-13 DIAGNOSIS — Z87891 Personal history of nicotine dependence: Secondary | ICD-10-CM | POA: Diagnosis not present

## 2015-12-13 DIAGNOSIS — J811 Chronic pulmonary edema: Secondary | ICD-10-CM | POA: Diagnosis not present

## 2015-12-13 DIAGNOSIS — I5042 Chronic combined systolic (congestive) and diastolic (congestive) heart failure: Secondary | ICD-10-CM | POA: Diagnosis not present

## 2015-12-13 DIAGNOSIS — R0602 Shortness of breath: Secondary | ICD-10-CM | POA: Diagnosis not present

## 2015-12-13 DIAGNOSIS — Z5181 Encounter for therapeutic drug level monitoring: Secondary | ICD-10-CM | POA: Diagnosis not present

## 2015-12-13 DIAGNOSIS — Z7984 Long term (current) use of oral hypoglycemic drugs: Secondary | ICD-10-CM | POA: Diagnosis not present

## 2015-12-13 DIAGNOSIS — J90 Pleural effusion, not elsewhere classified: Secondary | ICD-10-CM | POA: Diagnosis not present

## 2015-12-13 DIAGNOSIS — R918 Other nonspecific abnormal finding of lung field: Secondary | ICD-10-CM | POA: Diagnosis not present

## 2015-12-13 DIAGNOSIS — E861 Hypovolemia: Secondary | ICD-10-CM | POA: Diagnosis not present

## 2015-12-13 DIAGNOSIS — I130 Hypertensive heart and chronic kidney disease with heart failure and stage 1 through stage 4 chronic kidney disease, or unspecified chronic kidney disease: Secondary | ICD-10-CM | POA: Diagnosis not present

## 2015-12-13 DIAGNOSIS — E875 Hyperkalemia: Secondary | ICD-10-CM | POA: Diagnosis not present

## 2015-12-14 ENCOUNTER — Other Ambulatory Visit
Admission: RE | Admit: 2015-12-14 | Discharge: 2015-12-14 | Disposition: A | Discharge status: Home / Self Care | Payer: Medicare Other | Source: Ambulatory Visit | Attending: Family Medicine | Admitting: Family Medicine

## 2015-12-14 DIAGNOSIS — Z029 Encounter for administrative examinations, unspecified: Secondary | ICD-10-CM | POA: Insufficient documentation

## 2015-12-14 LAB — BASIC METABOLIC PANEL
ANION GAP: 10 (ref 5–15)
BUN: 88 mg/dL — ABNORMAL HIGH (ref 6–20)
CALCIUM: 9.1 mg/dL (ref 8.9–10.3)
CO2: 27 mmol/L (ref 22–32)
Chloride: 89 mmol/L — ABNORMAL LOW (ref 101–111)
Creatinine, Ser: 2.58 mg/dL — ABNORMAL HIGH (ref 0.61–1.24)
GFR, EST AFRICAN AMERICAN: 26 mL/min — AB (ref >60)
GFR, EST NON AFRICAN AMERICAN: 23 mL/min — AB (ref >60)
GLUCOSE: 107 mg/dL — AB (ref 65–99)
POTASSIUM: 5 mmol/L (ref 3.5–5.1)
SODIUM: 126 mmol/L — AB (ref 135–145)

## 2015-12-14 LAB — MAGNESIUM: MAGNESIUM: 2.2 mg/dL (ref 1.7–2.4)

## 2015-12-15 ENCOUNTER — Other Ambulatory Visit
Admission: AD | Admit: 2015-12-15 | Discharge: 2015-12-15 | Disposition: A | Discharge status: Home / Self Care | Payer: Medicare Other | Source: Ambulatory Visit | Attending: Family Medicine | Admitting: Family Medicine

## 2015-12-15 DIAGNOSIS — Z029 Encounter for administrative examinations, unspecified: Secondary | ICD-10-CM | POA: Diagnosis present

## 2015-12-15 LAB — MAGNESIUM: Magnesium: 2.4 mg/dL (ref 1.7–2.4)

## 2015-12-15 LAB — BASIC METABOLIC PANEL
ANION GAP: 10 (ref 5–15)
BUN: 83 mg/dL — ABNORMAL HIGH (ref 6–20)
CALCIUM: 9 mg/dL (ref 8.9–10.3)
CHLORIDE: 91 mmol/L — AB (ref 101–111)
CO2: 27 mmol/L (ref 22–32)
Creatinine, Ser: 2.31 mg/dL — ABNORMAL HIGH (ref 0.61–1.24)
GFR calc Af Amer: 30 mL/min — ABNORMAL LOW (ref >60)
GFR, EST NON AFRICAN AMERICAN: 26 mL/min — AB (ref >60)
GLUCOSE: 130 mg/dL — AB (ref 65–99)
POTASSIUM: 4.4 mmol/L (ref 3.5–5.1)
Sodium: 128 mmol/L — ABNORMAL LOW (ref 135–145)

## 2015-12-17 ENCOUNTER — Emergency Department
Admission: EM | Admit: 2015-12-17 | Discharge: 2015-12-17 | Disposition: A | Discharge status: Home / Self Care | Payer: Medicare Other | Attending: Emergency Medicine | Admitting: Emergency Medicine

## 2015-12-17 ENCOUNTER — Encounter: Payer: Self-pay | Admitting: Emergency Medicine

## 2015-12-17 ENCOUNTER — Emergency Department: Payer: Medicare Other

## 2015-12-17 DIAGNOSIS — Y9389 Activity, other specified: Secondary | ICD-10-CM | POA: Diagnosis not present

## 2015-12-17 DIAGNOSIS — S3992XA Unspecified injury of lower back, initial encounter: Secondary | ICD-10-CM | POA: Diagnosis present

## 2015-12-17 DIAGNOSIS — I10 Essential (primary) hypertension: Secondary | ICD-10-CM | POA: Diagnosis not present

## 2015-12-17 DIAGNOSIS — Y998 Other external cause status: Secondary | ICD-10-CM | POA: Insufficient documentation

## 2015-12-17 DIAGNOSIS — S29019A Strain of muscle and tendon of unspecified wall of thorax, initial encounter: Secondary | ICD-10-CM

## 2015-12-17 DIAGNOSIS — M546 Pain in thoracic spine: Secondary | ICD-10-CM | POA: Diagnosis not present

## 2015-12-17 DIAGNOSIS — X58XXXA Exposure to other specified factors, initial encounter: Secondary | ICD-10-CM | POA: Insufficient documentation

## 2015-12-17 DIAGNOSIS — S29012A Strain of muscle and tendon of back wall of thorax, initial encounter: Secondary | ICD-10-CM | POA: Insufficient documentation

## 2015-12-17 DIAGNOSIS — Z87891 Personal history of nicotine dependence: Secondary | ICD-10-CM | POA: Insufficient documentation

## 2015-12-17 DIAGNOSIS — E119 Type 2 diabetes mellitus without complications: Secondary | ICD-10-CM | POA: Insufficient documentation

## 2015-12-17 DIAGNOSIS — Y9289 Other specified places as the place of occurrence of the external cause: Secondary | ICD-10-CM | POA: Insufficient documentation

## 2015-12-17 LAB — CBC
HCT: 27.7 % — ABNORMAL LOW (ref 40.0–52.0)
HEMOGLOBIN: 9.4 g/dL — AB (ref 13.0–18.0)
MCH: 29.5 pg (ref 26.0–34.0)
MCHC: 33.9 g/dL (ref 32.0–36.0)
MCV: 87.1 fL (ref 80.0–100.0)
Platelets: 145 10*3/uL — ABNORMAL LOW (ref 150–440)
RBC: 3.18 MIL/uL — AB (ref 4.40–5.90)
RDW: 17.2 % — ABNORMAL HIGH (ref 11.5–14.5)
WBC: 4.4 10*3/uL (ref 3.8–10.6)

## 2015-12-17 LAB — URINALYSIS COMPLETE WITH MICROSCOPIC (ARMC ONLY)
Bilirubin Urine: NEGATIVE
Glucose, UA: NEGATIVE mg/dL
Hgb urine dipstick: NEGATIVE
KETONES UR: NEGATIVE mg/dL
Leukocytes, UA: NEGATIVE
NITRITE: NEGATIVE
PROTEIN: NEGATIVE mg/dL
SQUAMOUS EPITHELIAL / LPF: NONE SEEN
Specific Gravity, Urine: 1.01 (ref 1.005–1.030)
pH: 5 (ref 5.0–8.0)

## 2015-12-17 LAB — COMPREHENSIVE METABOLIC PANEL
ALK PHOS: 59 U/L (ref 38–126)
ALT: 18 U/L (ref 17–63)
AST: 22 U/L (ref 15–41)
Albumin: 4.5 g/dL (ref 3.5–5.0)
Anion gap: 10 (ref 5–15)
BUN: 62 mg/dL — ABNORMAL HIGH (ref 6–20)
CALCIUM: 9.1 mg/dL (ref 8.9–10.3)
CO2: 25 mmol/L (ref 22–32)
CREATININE: 1.79 mg/dL — AB (ref 0.61–1.24)
Chloride: 93 mmol/L — ABNORMAL LOW (ref 101–111)
GFR calc non Af Amer: 35 mL/min — ABNORMAL LOW (ref >60)
GFR, EST AFRICAN AMERICAN: 41 mL/min — AB (ref >60)
Glucose, Bld: 125 mg/dL — ABNORMAL HIGH (ref 65–99)
Potassium: 4.2 mmol/L (ref 3.5–5.1)
SODIUM: 128 mmol/L — AB (ref 135–145)
Total Bilirubin: 0.8 mg/dL (ref 0.3–1.2)
Total Protein: 7.4 g/dL (ref 6.5–8.1)

## 2015-12-17 LAB — MAGNESIUM: MAGNESIUM: 2.6 mg/dL — AB (ref 1.7–2.4)

## 2015-12-17 LAB — TROPONIN I: TROPONIN I: 0.07 ng/mL — AB (ref <0.031)

## 2015-12-17 LAB — PROTIME-INR
INR: 2.55
PROTHROMBIN TIME: 27.1 s — AB (ref 11.4–15.0)

## 2015-12-17 MED ORDER — MORPHINE SULFATE (PF) 4 MG/ML IV SOLN
4.0000 mg | Freq: Once | INTRAVENOUS | Status: AC
  Administered 2015-12-17: 4 mg via INTRAVENOUS
  Filled 2015-12-17: qty 1

## 2015-12-17 MED ORDER — OXYCODONE HCL 5 MG PO TABS
5.0000 mg | ORAL_TABLET | Freq: Once | ORAL | Status: AC
  Administered 2015-12-17: 5 mg via ORAL
  Filled 2015-12-17: qty 1

## 2015-12-17 MED ORDER — OXYCODONE HCL 5 MG PO TABS: 5.0000 mg | ORAL_TABLET | Freq: Three times a day (TID) | ORAL | Status: DC | PRN

## 2015-12-17 NOTE — ED Provider Notes (Signed)
[facility] Emergency Department Provider Note     Time seen: ----------------------------------------- 10:05 AM on 12/17/2015 -----------------------------------------    I have reviewed the triage vital signs and the nursing notes.   HISTORY  Chief Complaint Back Pain    HPI [name] is a 74 y.o. male who presents ER for lower back pain for last 2 weeks. Family states that the pain comes in waves where he can't get comfortable. Pain seems to be worse after these been laying in the bed for a long period of time. Recently he's had some renal insufficiency is been having his labs checked to ensure improvement. This was thought to be due to overdiuresis. Nothing makes his pain any better.   Past Medical History  Diagnosis Date  . Chronic atrial fibrillation (HCC)     a. warfarin; b. CHADSVASc 6 (CHF, HTN, age x 2, DM, vascular disease) giving him an estimated annual stroke risk of 9.8%  . Hypertension   . Hyperkalemia   . Chronic systolic CHF (congestive heart failure) ([place])     a. echo 10/2014: EF 30-35%, AK of inf & post myocardium, HK of apical & lat wall, ant & antsept wall best preserved, mitral: mechanical prosthesis was present & well seated, LA moderately dilated, RV systolic fxn mildly reduced, PASP nl  . Coronary artery disease     Cath 2005- patent LIMA-LAD, SVG-OM grafts, moderate MR  . Prostate cancer ([place]) 04/2009  . Hyperlipidemia   . COPD (chronic obstructive pulmonary disease) ([place])     URI 9/12  . DM type 2 (diabetes mellitus, type 2) ([place])   . PNA (pneumonia) 03/2010  . Glaucoma   . Hard of hearing   . Myocardial infarction ([place])   . Gout   . Adenomatous polyps   . Diverticulosis     left side   . H/O mitral valve prolapse   . Sigmoid diverticulitis     perforated    Patient Active Problem List   Diagnosis Date Noted  . Acute on chronic systolic (congestive) heart failure ([place]) 11/20/2015  . Aortic stenosis 11/20/2015   . Encounter for therapeutic drug monitoring 02/10/2014  . Bradycardia 10/28/2013  . Aspiration into respiratory tract 03/14/2013  . Mediastinal lymphadenopathy 03/01/2013  . Pneumonia 11/23/2012  . Hypoxemia 11/09/2012  . Preoperative cardiovascular examination 09/14/2012  . Personal history of adenomatous colonic polyps 04/30/2012  . 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 COPD (chronic obstructive pulmonary disease) ([place]) 09/15/2011  . Mitral valve disorder 03/11/2011  . Long term current use of anticoagulant 03/11/2011  . MITRAL VALVE REPLACEMENT, HX OF 07/29/2010  . Diabetes mellitus type 2 with complications ([place]) 123456  . Hyperlipidemia 01/25/2010  . Essential hypertension 05/01/2009  . CAD, ARTERY BYPASS GRAFT 05/01/2009  . Atrial fibrillation ([place]) 05/01/2009  . Chronic systolic heart failure ([place]) 05/01/2009    Past Surgical History  Procedure Laterality Date  . Coronary artery bypass graft  1989    LIMA-LAD, SVG-OM  . Mitral valve replacement  2007    Mechanical, performed at [facility]  . Hernia repair    . External ear surgery      drum  . Colon resection  01/12/2012    sigmoid colectomy and colostomy for diverticulitis  . Cataract extraction    . Colonoscopy  11/23/2008    Allergies Acetaminophen-codeine; Altace; Codeine; Other; Sulfonamide derivatives; and Sulfa antibiotics  Social History Social History  Substance Use Topics  .  Smoking status: Former Smoker -- 4.00 packs/day for 23 years    Types: Cigarettes    Quit date: 12/23/1987  . Smokeless tobacco: Never Used  . Alcohol Use: No    Review of Systems Constitutional: Negative for fever. Eyes: Negative for visual changes. ENT: Negative for sore throat. Cardiovascular: Negative for chest pain. Respiratory: Negative for shortness of breath. Gastrointestinal: Negative for abdominal pain, vomiting and diarrhea. Genitourinary:  Negative for dysuria. Musculoskeletal: Positive for back pain Skin: Negative for rash. Neurological: Negative for headaches, focal weakness or numbness.  10-point ROS otherwise negative.  ____________________________________________   PHYSICAL EXAM:  VITAL SIGNS: ED Triage Vitals  Enc Vitals Group     BP 12/17/15 0923 146/74 mmHg     Pulse Rate 12/17/15 0923 87     Resp 12/17/15 0923 23     Temp 12/17/15 0923 97.6 F (36.4 C)     Temp Source 12/17/15 0923 Oral     SpO2 12/17/15 0923 99 %     Weight 12/17/15 0923 147 lb 3.2 oz (66.769 kg)     Height 12/17/15 0923 6' (1.829 m)     Head Cir --      Peak Flow --      Pain Score 12/17/15 0933 10     Pain Loc --      Pain Edu? --      Excl. in [place]? --     Constitutional: Alert and oriented. Well appearing and in no distress. Eyes: Conjunctivae are normal. PERRL. Normal extraocular movements. ENT   Head: Normocephalic and atraumatic.   Nose: No congestion/rhinnorhea.   Mouth/Throat: Mucous membranes are moist.   Neck: No stridor. Cardiovascular: Normal rate, regular rhythm. Normal and symmetric distal pulses are present in all extremities. No murmurs, rubs, or gallops. Respiratory: Normal respiratory effort without tachypnea nor retractions. Breath sounds are clear and equal bilaterally. No wheezes/rales/rhonchi. Gastrointestinal: Soft and nontender. No distention. No abdominal bruits.  Musculoskeletal: Nontender with normal range of motion in all extremities. No joint effusions.  No lower extremity tenderness nor edema. Patient points to tenderness in the lower thoracic and upper lumbar spine area. Neurologic:  Normal speech and language. No gross focal neurologic deficits are appreciated. Speech is normal. No gait instability. Negative crossed straight leg raise exam. No sensory deficit or motor deficit in lower extremities. Skin:  Skin is warm, dry and intact. No rash noted. Psychiatric: Mood and affect are normal.  Speech and behavior are normal. Patient exhibits appropriate insight and judgment. ____________________________________________  EKG: Interpreted by me. A 2 fibrillation with a rate of 80 bpm, normal QRS with, borderline long QT interval. Normal axis. Right Right Bundle-branch block  ____________________________________________  ED COURSE:  Pertinent labs & imaging results that were available during my care of the patient were reviewed by me and considered in my medical decision making (see chart for details). Patient is in no acute distress, will receive IV morphine for pain. ____________________________________________    LABS (pertinent positives/negatives)  Labs Reviewed  CBC - Abnormal; Notable for the following:    RBC 3.18 (*)    Hemoglobin 9.4 (*)    HCT 27.7 (*)    RDW 17.2 (*)    Platelets 145 (*)    All other components within normal limits  URINALYSIS COMPLETEWITH MICROSCOPIC ([facility] ONLY) - Abnormal; Notable for the following:    Color, Urine YELLOW (*)    APPearance CLEAR (*)    Bacteria, UA RARE (*)    All other components  within normal limits  COMPREHENSIVE METABOLIC PANEL  TROPONIN I  MAGNESIUM  PROTIME-INR    RADIOLOGY Images were viewed by me  Thoracic spine x-rays IMPRESSION: 1. Stable lower thoracic degenerative spurring. No acute abnormality. ____________________________________________  FINAL ASSESSMENT AND PLAN  Back pain  Plan: Patient with labs and imaging as dictated above. Patient's labs are improved compared to what it had been recently. He is being discharged with oxycodone for back pain. This is likely muscle spasm related. He stable for outpatient follow-up with his doctor.   Earleen Newport, MD   Earleen Newport, MD 12/17/15 [phone]

## 2015-12-17 NOTE — Discharge Instructions (Signed)
Thoracic Strain A thoracic strain, which is sometimes called a mid-back strain, is an injury to the muscles or tendons that attach to the upper part of your back behind your chest. This type of injury occurs when a muscle is overstretched or overloaded.  Thoracic strains can range from mild to severe. Mild strains may involve stretching a muscle or tendon without tearing it. These injuries may heal in 1-2 weeks. More severe strains involve tearing of muscle fibers or tendons. These will cause more pain and may take 6-8 weeks to heal. CAUSES This condition may be caused by:  An injury in which a sudden force is placed on the muscle.  Exercising without properly warming up.  Overuse of the muscle.  Improper form during certain movements.  Other injuries that surround or cause stress on the mid-back, causing a strain on the muscles. In some cases, the cause may not be known. RISK FACTORS This injury is more common in:  Athletes.  People with obesity. SYMPTOMS The main symptom of this condition is pain, especially with movement. Other symptoms include:  Bruising.  Swelling.  Spasm. DIAGNOSIS This condition may be diagnosed with a physical exam. X-rays may be taken to check for a fracture. TREATMENT This condition may be treated with:  Resting and icing the injured area.  Physical therapy. This will involve doing stretching and strengthening exercises.  Medicines for pain and inflammation. HOME CARE INSTRUCTIONS  Rest as needed. Follow instructions from your health care provider about any restrictions on activity.  If directed, apply ice to the injured area:  Put ice in a plastic bag.  Place a towel between your skin and the bag.  Leave the ice on for 20 minutes, 2-3 times per day.  Take over-the-counter and prescription medicines only as told by your health care provider.  Begin doing exercises as told by your health care provider or physical therapist.  Always  warm up properly before physical activity or sports.  Bend your knees before you lift heavy objects.  Keep all follow-up visits as told by your health care provider. This is important. SEEK MEDICAL CARE IF:  Your pain is not helped by medicine.  Your pain, bruising, or swelling is getting worse.  You have a fever. SEEK IMMEDIATE MEDICAL CARE IF:  You have shortness of breath.  You have chest pain.  You develop numbness or weakness in your legs.  You have involuntary loss of urine (urinary incontinence).   This information is not intended to replace advice given to you by your health care provider. Make sure you discuss any questions you have with your health care provider.   Document Released: 02/28/2004 Document Revised: 08/29/2015 Document Reviewed: 02/01/2015 Elsevier Interactive Patient Education 2016 [employer] Inc[employer]  

## 2015-12-17 NOTE — ED Notes (Signed)
Troponin 0.07. MD aware.

## 2015-12-17 NOTE — ED Notes (Signed)
Pt held the following meds since Thursday: Digoxin, Cozar, Metformin, Aldactone, and Torsemide due to kidney function.

## 2015-12-17 NOTE — ED Notes (Addendum)
Pt came to ED c/o lower back painx2 weeks. Denies chest pain and SOB.

## 2015-12-19 ENCOUNTER — Ambulatory Visit (INDEPENDENT_AMBULATORY_CARE_PROVIDER_SITE_OTHER): Payer: Medicare Other

## 2015-12-19 DIAGNOSIS — Z5181 Encounter for therapeutic drug level monitoring: Secondary | ICD-10-CM

## 2015-12-19 DIAGNOSIS — I4891 Unspecified atrial fibrillation: Secondary | ICD-10-CM

## 2015-12-19 DIAGNOSIS — Z9889 Other specified postprocedural states: Secondary | ICD-10-CM

## 2015-12-19 DIAGNOSIS — Z7901 Long term (current) use of anticoagulants: Secondary | ICD-10-CM

## 2015-12-19 DIAGNOSIS — I482 Chronic atrial fibrillation, unspecified: Secondary | ICD-10-CM

## 2015-12-19 DIAGNOSIS — I059 Rheumatic mitral valve disease, unspecified: Secondary | ICD-10-CM

## 2015-12-19 LAB — POCT INR: INR: 2.3

## 2015-12-20 DIAGNOSIS — N184 Chronic kidney disease, stage 4 (severe): Secondary | ICD-10-CM | POA: Diagnosis not present

## 2015-12-20 DIAGNOSIS — M549 Dorsalgia, unspecified: Secondary | ICD-10-CM | POA: Diagnosis not present

## 2015-12-20 DIAGNOSIS — I5020 Unspecified systolic (congestive) heart failure: Secondary | ICD-10-CM | POA: Diagnosis not present

## 2015-12-20 DIAGNOSIS — E1121 Type 2 diabetes mellitus with diabetic nephropathy: Secondary | ICD-10-CM | POA: Diagnosis not present

## 2015-12-20 DIAGNOSIS — E119 Type 2 diabetes mellitus without complications: Secondary | ICD-10-CM | POA: Diagnosis not present

## 2015-12-20 DIAGNOSIS — N289 Disorder of kidney and ureter, unspecified: Secondary | ICD-10-CM | POA: Diagnosis not present

## 2015-12-20 DIAGNOSIS — I4891 Unspecified atrial fibrillation: Secondary | ICD-10-CM | POA: Diagnosis not present

## 2015-12-21 ENCOUNTER — Other Ambulatory Visit
Admission: RE | Admit: 2015-12-21 | Discharge: 2015-12-21 | Disposition: A | Discharge status: Home / Self Care | Payer: Medicare Other | Source: Ambulatory Visit | Attending: Family Medicine | Admitting: Family Medicine

## 2015-12-21 DIAGNOSIS — I5022 Chronic systolic (congestive) heart failure: Secondary | ICD-10-CM | POA: Insufficient documentation

## 2015-12-21 LAB — BASIC METABOLIC PANEL
Anion gap: 7 (ref 5–15)
BUN: 48 mg/dL — AB (ref 6–20)
CALCIUM: 8.7 mg/dL — AB (ref 8.9–10.3)
CO2: 26 mmol/L (ref 22–32)
Chloride: 93 mmol/L — ABNORMAL LOW (ref 101–111)
Creatinine, Ser: 2.23 mg/dL — ABNORMAL HIGH (ref 0.61–1.24)
GFR calc Af Amer: 31 mL/min — ABNORMAL LOW (ref >60)
GFR, EST NON AFRICAN AMERICAN: 27 mL/min — AB (ref >60)
GLUCOSE: 133 mg/dL — AB (ref 65–99)
POTASSIUM: 5.2 mmol/L — AB (ref 3.5–5.1)
SODIUM: 126 mmol/L — AB (ref 135–145)

## 2015-12-21 LAB — MAGNESIUM: MAGNESIUM: 2.1 mg/dL (ref 1.7–2.4)

## 2015-12-26 ENCOUNTER — Ambulatory Visit (INDEPENDENT_AMBULATORY_CARE_PROVIDER_SITE_OTHER): Payer: Medicare Other

## 2015-12-26 DIAGNOSIS — I059 Rheumatic mitral valve disease, unspecified: Secondary | ICD-10-CM

## 2015-12-26 DIAGNOSIS — Z5181 Encounter for therapeutic drug level monitoring: Secondary | ICD-10-CM

## 2015-12-26 DIAGNOSIS — Z7901 Long term (current) use of anticoagulants: Secondary | ICD-10-CM

## 2015-12-26 DIAGNOSIS — I482 Chronic atrial fibrillation, unspecified: Secondary | ICD-10-CM

## 2015-12-26 DIAGNOSIS — I4891 Unspecified atrial fibrillation: Secondary | ICD-10-CM

## 2015-12-26 DIAGNOSIS — Z9889 Other specified postprocedural states: Secondary | ICD-10-CM

## 2015-12-26 LAB — POCT INR: INR: 2.1

## 2015-12-28 ENCOUNTER — Other Ambulatory Visit
Admission: RE | Admit: 2015-12-28 | Discharge: 2015-12-28 | Disposition: A | Discharge status: Home / Self Care | Payer: Medicare Other | Source: Ambulatory Visit | Attending: Family Medicine | Admitting: Family Medicine

## 2015-12-28 DIAGNOSIS — I5022 Chronic systolic (congestive) heart failure: Secondary | ICD-10-CM | POA: Diagnosis not present

## 2015-12-28 DIAGNOSIS — Z79899 Other long term (current) drug therapy: Secondary | ICD-10-CM | POA: Diagnosis not present

## 2015-12-28 LAB — BASIC METABOLIC PANEL
ANION GAP: 8 (ref 5–15)
BUN: 32 mg/dL — AB (ref 6–20)
CALCIUM: 9.3 mg/dL (ref 8.9–10.3)
CO2: 29 mmol/L (ref 22–32)
CREATININE: 1.82 mg/dL — AB (ref 0.61–1.24)
Chloride: 99 mmol/L — ABNORMAL LOW (ref 101–111)
GFR calc Af Amer: 40 mL/min — ABNORMAL LOW (ref >60)
GFR, EST NON AFRICAN AMERICAN: 34 mL/min — AB (ref >60)
GLUCOSE: 176 mg/dL — AB (ref 65–99)
Potassium: 4.8 mmol/L (ref 3.5–5.1)
Sodium: 136 mmol/L (ref 135–145)

## 2015-12-28 LAB — MAGNESIUM: Magnesium: 1.9 mg/dL (ref 1.7–2.4)

## 2016-01-02 ENCOUNTER — Ambulatory Visit (INDEPENDENT_AMBULATORY_CARE_PROVIDER_SITE_OTHER): Payer: Medicare Other

## 2016-01-02 DIAGNOSIS — I4891 Unspecified atrial fibrillation: Secondary | ICD-10-CM | POA: Diagnosis not present

## 2016-01-02 DIAGNOSIS — I482 Chronic atrial fibrillation, unspecified: Secondary | ICD-10-CM

## 2016-01-02 DIAGNOSIS — Z5181 Encounter for therapeutic drug level monitoring: Secondary | ICD-10-CM

## 2016-01-02 DIAGNOSIS — Z7901 Long term (current) use of anticoagulants: Secondary | ICD-10-CM | POA: Diagnosis not present

## 2016-01-02 DIAGNOSIS — Z9889 Other specified postprocedural states: Secondary | ICD-10-CM | POA: Diagnosis not present

## 2016-01-02 DIAGNOSIS — I059 Rheumatic mitral valve disease, unspecified: Secondary | ICD-10-CM | POA: Diagnosis not present

## 2016-01-02 LAB — POCT INR: INR: 3.3

## 2016-01-03 ENCOUNTER — Telehealth: Payer: Self-pay | Admitting: Pulmonary Disease

## 2016-01-03 DIAGNOSIS — E119 Type 2 diabetes mellitus without complications: Secondary | ICD-10-CM | POA: Diagnosis not present

## 2016-01-03 DIAGNOSIS — I10 Essential (primary) hypertension: Secondary | ICD-10-CM | POA: Diagnosis not present

## 2016-01-03 DIAGNOSIS — I4891 Unspecified atrial fibrillation: Secondary | ICD-10-CM | POA: Diagnosis not present

## 2016-01-03 DIAGNOSIS — Z794 Long term (current) use of insulin: Secondary | ICD-10-CM | POA: Diagnosis not present

## 2016-01-03 DIAGNOSIS — J438 Other emphysema: Secondary | ICD-10-CM

## 2016-01-03 DIAGNOSIS — R0609 Other forms of dyspnea: Secondary | ICD-10-CM | POA: Diagnosis not present

## 2016-01-03 NOTE — Telephone Encounter (Signed)
Called spoke with spouse. They are requesting order to be sent to [facility] for eval for portable O2. i have placed order. Nothing further needed

## 2016-01-04 DIAGNOSIS — H353222 Exudative age-related macular degeneration, left eye, with inactive choroidal neovascularization: Secondary | ICD-10-CM | POA: Diagnosis not present

## 2016-01-09 ENCOUNTER — Other Ambulatory Visit
Admission: RE | Admit: 2016-01-09 | Discharge: 2016-01-09 | Disposition: A | Discharge status: Home / Self Care | Payer: Medicare Other | Source: Ambulatory Visit | Attending: Internal Medicine | Admitting: Internal Medicine

## 2016-01-09 DIAGNOSIS — I5022 Chronic systolic (congestive) heart failure: Secondary | ICD-10-CM | POA: Insufficient documentation

## 2016-01-09 DIAGNOSIS — Z79899 Other long term (current) drug therapy: Secondary | ICD-10-CM | POA: Insufficient documentation

## 2016-01-09 LAB — BASIC METABOLIC PANEL
ANION GAP: 9 (ref 5–15)
BUN: 64 mg/dL — ABNORMAL HIGH (ref 6–20)
CALCIUM: 9.2 mg/dL (ref 8.9–10.3)
CO2: 30 mmol/L (ref 22–32)
Chloride: 97 mmol/L — ABNORMAL LOW (ref 101–111)
Creatinine, Ser: 2.05 mg/dL — ABNORMAL HIGH (ref 0.61–1.24)
GFR, EST AFRICAN AMERICAN: 35 mL/min — AB (ref >60)
GFR, EST NON AFRICAN AMERICAN: 30 mL/min — AB (ref >60)
Glucose, Bld: 159 mg/dL — ABNORMAL HIGH (ref 65–99)
POTASSIUM: 4.7 mmol/L (ref 3.5–5.1)
Sodium: 136 mmol/L (ref 135–145)

## 2016-01-10 ENCOUNTER — Other Ambulatory Visit: Payer: Self-pay | Admitting: Cardiovascular Disease

## 2016-01-10 NOTE — Telephone Encounter (Signed)
Medication   losartan (COZAAR) 100 MG tablet [[id number]]       losartan (COZAAR) 100 MG tablet [id number] DISCONTINUED     Order Details    Dose, Route, Frequency: As Directed    Dispense Quantity:  45 tablet Refills:  3 Fills Remaining:  3          Sig: Take one-half tablet by mouth daily         Discontinue Date:  11/25/2015 [id number] Discontinue User:  Charlie Pitter, PA-C Discontinue Reason:  Stop Taking at Discharge   Written Date:  02/28/15 Expiration Date:  02/28/16     Start Date:  02/28/15 End Date:  11/25/15     Ordering Provider:  Minna Merritts, MD Authorizing Provider:  Minna Merritts, MD Ordering User:  Britt Bottom, CMA                    Original Order:  losartan (COZAAR) 100 MG tablet [id number]        Pharmacy:  [facility], [place] Comments:  --         Quantity Remaining:  135 tablet Quantity Filled:  0 tablet

## 2016-01-14 ENCOUNTER — Other Ambulatory Visit
Admission: RE | Admit: 2016-01-14 | Discharge: 2016-01-14 | Disposition: A | Discharge status: Home / Self Care | Payer: Medicare Other | Source: Ambulatory Visit | Attending: Internal Medicine | Admitting: Internal Medicine

## 2016-01-14 DIAGNOSIS — I5022 Chronic systolic (congestive) heart failure: Secondary | ICD-10-CM | POA: Insufficient documentation

## 2016-01-14 DIAGNOSIS — Z79899 Other long term (current) drug therapy: Secondary | ICD-10-CM | POA: Insufficient documentation

## 2016-01-14 LAB — BASIC METABOLIC PANEL
Anion gap: 9 (ref 5–15)
BUN: 47 mg/dL — ABNORMAL HIGH (ref 6–20)
CHLORIDE: 100 mmol/L — AB (ref 101–111)
CO2: 31 mmol/L (ref 22–32)
Calcium: 9.7 mg/dL (ref 8.9–10.3)
Creatinine, Ser: 1.59 mg/dL — ABNORMAL HIGH (ref 0.61–1.24)
GFR calc non Af Amer: 41 mL/min — ABNORMAL LOW (ref >60)
GFR, EST AFRICAN AMERICAN: 47 mL/min — AB (ref >60)
Glucose, Bld: 155 mg/dL — ABNORMAL HIGH (ref 65–99)
POTASSIUM: 4.2 mmol/L (ref 3.5–5.1)
SODIUM: 140 mmol/L (ref 135–145)

## 2016-01-16 ENCOUNTER — Ambulatory Visit (INDEPENDENT_AMBULATORY_CARE_PROVIDER_SITE_OTHER): Payer: Medicare Other

## 2016-01-16 DIAGNOSIS — I059 Rheumatic mitral valve disease, unspecified: Secondary | ICD-10-CM | POA: Diagnosis not present

## 2016-01-16 DIAGNOSIS — Z8546 Personal history of malignant neoplasm of prostate: Secondary | ICD-10-CM | POA: Diagnosis not present

## 2016-01-16 DIAGNOSIS — Z5181 Encounter for therapeutic drug level monitoring: Secondary | ICD-10-CM | POA: Diagnosis not present

## 2016-01-16 DIAGNOSIS — I482 Chronic atrial fibrillation, unspecified: Secondary | ICD-10-CM

## 2016-01-16 DIAGNOSIS — C61 Malignant neoplasm of prostate: Secondary | ICD-10-CM | POA: Diagnosis not present

## 2016-01-16 DIAGNOSIS — I4891 Unspecified atrial fibrillation: Secondary | ICD-10-CM | POA: Diagnosis not present

## 2016-01-16 DIAGNOSIS — Z9889 Other specified postprocedural states: Secondary | ICD-10-CM

## 2016-01-16 DIAGNOSIS — Z7901 Long term (current) use of anticoagulants: Secondary | ICD-10-CM

## 2016-01-16 DIAGNOSIS — N358 Other urethral stricture: Secondary | ICD-10-CM | POA: Diagnosis not present

## 2016-01-16 DIAGNOSIS — R339 Retention of urine, unspecified: Secondary | ICD-10-CM | POA: Diagnosis not present

## 2016-01-16 DIAGNOSIS — N139 Obstructive and reflux uropathy, unspecified: Secondary | ICD-10-CM | POA: Diagnosis not present

## 2016-01-16 LAB — POCT INR: INR: 1.9

## 2016-01-17 ENCOUNTER — Ambulatory Visit: Payer: Medicare Other | Admitting: Cardiovascular Disease

## 2016-01-17 ENCOUNTER — Telehealth: Payer: Self-pay | Admitting: Pulmonary Disease

## 2016-01-17 DIAGNOSIS — J441 Chronic obstructive pulmonary disease with (acute) exacerbation: Secondary | ICD-10-CM

## 2016-01-17 NOTE — Telephone Encounter (Signed)
Spoke with the pt's daughter  Pt has ov tomorrow at 4 pm with Dr. Lake Bells  He is coughing quite a bit and bringing up some white sputum  She is concerned his has PNA  She states that is it turns out he does have PNA, he needs to be admitted to [facility] for tx due to his advanced heart failure  She is asking if Dr. Lake Bells wants him to go for cxr in [facility] prior to his appt here at 4 pm tomorrow  Please advise thanks

## 2016-01-18 ENCOUNTER — Ambulatory Visit (INDEPENDENT_AMBULATORY_CARE_PROVIDER_SITE_OTHER)
Admission: RE | Admit: 2016-01-18 | Discharge: 2016-01-18 | Disposition: A | Discharge status: Home / Self Care | Payer: Medicare Other | Source: Ambulatory Visit | Attending: Pulmonary Disease | Admitting: Pulmonary Disease

## 2016-01-18 ENCOUNTER — Encounter: Payer: Self-pay | Admitting: Pulmonary Disease

## 2016-01-18 ENCOUNTER — Ambulatory Visit (INDEPENDENT_AMBULATORY_CARE_PROVIDER_SITE_OTHER): Payer: Medicare Other | Admitting: Pulmonary Disease

## 2016-01-18 VITALS — BP 122/62 | HR 97 | Ht 66.0 in | Wt 152.8 lb

## 2016-01-18 DIAGNOSIS — J438 Other emphysema: Secondary | ICD-10-CM | POA: Diagnosis not present

## 2016-01-18 DIAGNOSIS — J441 Chronic obstructive pulmonary disease with (acute) exacerbation: Secondary | ICD-10-CM

## 2016-01-18 DIAGNOSIS — R05 Cough: Secondary | ICD-10-CM | POA: Diagnosis not present

## 2016-01-18 DIAGNOSIS — R918 Other nonspecific abnormal finding of lung field: Secondary | ICD-10-CM | POA: Diagnosis not present

## 2016-01-18 DIAGNOSIS — J13 Pneumonia due to Streptococcus pneumoniae: Secondary | ICD-10-CM | POA: Diagnosis not present

## 2016-01-18 MED ORDER — PREDNISONE 10 MG PO TABS: ORAL_TABLET | ORAL | Status: DC

## 2016-01-18 MED ORDER — LEVOFLOXACIN 750 MG PO TABS: 750.0000 mg | ORAL_TABLET | Freq: Every day | ORAL | Status: DC

## 2016-01-18 NOTE — Progress Notes (Signed)
Subjective:    Patient ID: [name], male    DOB: 15-Aug-1941, 75 y.o.   MRN: [id number]  Synopsis: [name] was first seen in the [facility] office in 10/2012 for COPD.  He had been admitted to the hospital for bronchitis in months prior.  His spirometry showed clear obstruction and an FEV1 of 48% predicted.  He also has a history of a CABG and Mitral valve replacement.  HPI   Chief Complaint  Patient presents with  . Acute Visit    pt. c/o of prod. cough white in color. increased SOB. wheezing worsen @ night. chest tightness.     [name] has not been feeling as well in the last few days.  He has been coughing more, having significant wheezing. He is worried about catching pneumonia. He has been trying really hard to keep u with his dietary changes including a salt free diet. His family was worried about him.  He has been sick since Sunday.  He has been around sick people frequently with multiple doctor's visits.  No fever or chills.  He has not had significant pain with the exception of some musle spasms in his lower back on the right side.  He had to go to the ER for the muscle spasms and was prescribed narcotics for this.    Past Medical History  Diagnosis Date  . Chronic atrial fibrillation (HCC)     a. warfarin; b. CHADSVASc 6 (CHF, HTN, age x 2, DM, vascular disease) giving him an estimated annual stroke risk of 9.8%  . Hypertension   . Hyperkalemia   . Chronic systolic CHF (congestive heart failure) ([place])     a. echo 10/2014: EF 30-35%, AK of inf & post myocardium, HK of apical & lat wall, ant & antsept wall best preserved, mitral: mechanical prosthesis was present & well seated, LA moderately dilated, RV systolic fxn mildly reduced, PASP nl  . Coronary artery disease     Cath 2005- patent LIMA-LAD, SVG-OM grafts, moderate MR  . Prostate cancer ([place]) 04/2009  . Hyperlipidemia   . COPD (chronic obstructive pulmonary disease) ([place])     URI 9/12  . DM type 2  (diabetes mellitus, type 2) ([place])   . PNA (pneumonia) 03/2010  . Glaucoma   . Hard of hearing   . Myocardial infarction ([place])   . Gout   . Adenomatous polyps   . Diverticulosis     left side   . H/O mitral valve prolapse   . Sigmoid diverticulitis     perforated      Review of Systems  Constitutional: Negative for fever, chills and fatigue.  HENT: Negative for congestion, postnasal drip and rhinorrhea.   Respiratory: Positive for cough, shortness of breath and wheezing. Negative for choking.   Cardiovascular: Negative for chest pain, palpitations and leg swelling.  Gastrointestinal: Negative for nausea, abdominal pain, diarrhea, constipation, abdominal distention and rectal pain.       Objective:   Physical Exam  Filed Vitals:   01/18/16 1552  BP: 122/62  Pulse: 97  Height: 5\' 6"  (1.676 m)  Weight: 152 lb 12.8 oz (69.31 kg)  SpO2: 92%  RA  Gen:  Chronically ill appearing, no acute distress HEENT: NCAT, EOMi, OP clear PULM: Wheezing bilaterally, rhonchi L>R, normal effort CV: Irreg Irreg, mechanical S1, no JVD AB: BS+, soft, nontender, no hsm Ext: warm, no edema, no clubbing, no cyanosis   10/25/2012 CXR >> cardiomegally, no clear infiltrate 10/25/2012 Spirometry >>  clear obstruction on flow volume loop, FEV1 48% predicted 11/2012 CT chest >> LLL pneumonia, enlarged mediastinal lymph nodes 02/22/2013 CT chest [facility] >> complete resolution of pneumonia; mediastinal lymphadenopathy persists, 1.7 cm 4R node, 1.0cm 7 node 08/2013 CT chest > improved mediastinal lymphadenopathy 12/19/2013 > bilateral fluffy infiltrates consistent with pneumonia May 2015 CT chest> no change in chronic mediastinal lymphadenopathy May 2016 CXR > nodularity left lung  12/2015 CXR  Images personally reviewed, cardiomegally, no infiltrate noted Records form his hospital visit at [facility] were reviewed where he was considered for an LVAD but was ultimately rejected. He had an abnormal CT scan during that  time with possible pneumonia.     Assessment & Plan:   COPD (chronic obstructive pulmonary disease) ([place]) Karl has been having increased dyspnea, wheezing, cough, and mucus production in the last few weeks consistent with an exacerbation of his severe COPD.  I do not see evidence of pneumonia on today's CXR.  Plan: Prednisone taper Levaquin (he tends to do better when treated with strong antibiotics) Continue Symbicort and Spiriva   Pneumonia I see no evidence of pneumonia on today's chest x-ray but he has a tendency develop this quite easily. I'm very concerned that he may be heading in that direction now. That's why I'm going to treat him with a higher dose of antibiotic than normal. We will also treat him with a prednisone taper.  Of note, when he was recently at [facility] he had a CT scan which showed an abnormality in his lung which was felt to be pneumonia but he needed attention on follow-up to rule out the low possibility of occult malignancy. We will order a CT chest in early March and then see him after that.    Updated Medication List Outpatient Encounter Prescriptions as of 01/18/2016  Medication Sig  . aspirin EC 81 MG tablet Take 1 tablet (81 mg total) by mouth daily.  . beta carotene w/minerals (OCUVITE) tablet Take 1 tablet by mouth daily.  . budesonide-formoterol (SYMBICORT) 160-4.5 MCG/ACT inhaler Inhale 2 puffs into the lungs 2 (two) times daily.  . Cholecalciferol (VITAMIN D) 2000 UNITS tablet Take 2,000 Units by mouth daily.  . digoxin (DIGOX) 0.125 MG tablet Take 1 tablet by mouth  daily  . HYDROcodone-acetaminophen (NORCO/VICODIN) 5-325 MG tablet Take 1-2 tabs every 4-6 hrs PRN pain  . latanoprost (XALATAN) 0.005 % ophthalmic solution Place 1 drop into both eyes at bedtime.   Marland Kitchen losartan (COZAAR) 100 MG tablet Take 100 mg by mouth daily.  . metFORMIN (GLUCOPHAGE) 500 MG tablet Take 1,000 mg by mouth 2 (two) times daily with a meal.  . metFORMIN (GLUCOPHAGE) 500 MG  tablet Take 2 tablets by mouth  twice a day  . metoprolol succinate (TOPROL-XL) 50 MG 24 hr tablet Take 50 mg by mouth daily. Take with or immediately following a meal.  . oxyCODONE (ROXICODONE) 5 MG immediate release tablet Take 1 tablet (5 mg total) by mouth every 8 (eight) hours as needed.  . simvastatin (ZOCOR) 40 MG tablet Take 40 mg by mouth at bedtime.   . simvastatin (ZOCOR) 40 MG tablet Take 1 tablet by mouth  daily  . SPIRIVA RESPIMAT 2.5 MCG/ACT AERS Use 2 puffs daily  . spironolactone (ALDACTONE) 25 MG tablet Take 25 mg by mouth daily.  . Tamsulosin HCl (FLOMAX) 0.4 MG CAPS Take 0.4 mg by mouth daily.   Marland Kitchen torsemide (DEMADEX) 20 MG tablet Take 40 mg by mouth 2 (two) times daily.  Marland Kitchen  warfarin (COUMADIN) 2.5 MG tablet Take as directed by  [facility]  . levofloxacin (LEVAQUIN) 750 MG tablet Take 1 tablet (750 mg total) by mouth daily.  . predniSONE (DELTASONE) 10 MG tablet Then take 30mg  po daily for 4 days, then take 20mg  po daily for 4 days, then take 10mg  po daily for 3 days   No facility-administered encounter medications on file as of 01/18/2016.

## 2016-01-18 NOTE — Telephone Encounter (Signed)
Yes, CXR prior to visit

## 2016-01-18 NOTE — Assessment & Plan Note (Addendum)
[name] has been having increased dyspnea, wheezing, cough, and mucus production in the last few weeks consistent with an exacerbation of his severe COPD.  I do not see evidence of pneumonia on today's CXR.  Plan: Prednisone taper Levaquin (he tends to do better when treated with strong antibiotics) Continue Symbicort and Spiriva

## 2016-01-18 NOTE — Assessment & Plan Note (Signed)
I see no evidence of pneumonia on today's chest x-ray but he has a tendency develop this quite easily. I'm very concerned that he may be heading in that direction now. That's why I'm going to treat him with a higher dose of antibiotic than normal. We will also treat him with a prednisone taper.  Of note, when he was recently at [facility] he had a CT scan which showed an abnormality in his lung which was felt to be pneumonia but he needed attention on follow-up to rule out the low possibility of occult malignancy. We will order a CT chest in early March and then see him after that.

## 2016-01-18 NOTE — Telephone Encounter (Signed)
Called and advised patient's daughter to have patient arrive about 15 minutes early so he can have CXR done prior to appointment.   Entered order for CXR and placed note on APPT NOTES that patient is to have CXR prior to appointment. Nothing further needed.

## 2016-01-18 NOTE — Patient Instructions (Signed)
Take the prednisone taper as prescribed Take the Levaquin as prescribed We will arrange a CAT scan in [place] in early March to follow-up the pulmonary nodule seen in your [facility] We will see you back in March

## 2016-01-18 NOTE — Addendum Note (Signed)
Addended by: Osa Craver on: 01/18/2016 04:49 PM   Modules accepted: Orders

## 2016-01-24 ENCOUNTER — Other Ambulatory Visit
Admission: RE | Admit: 2016-01-24 | Discharge: 2016-01-24 | Disposition: A | Discharge status: Home / Self Care | Payer: Medicare Other | Source: Ambulatory Visit | Attending: Internal Medicine | Admitting: Internal Medicine

## 2016-01-24 DIAGNOSIS — I5022 Chronic systolic (congestive) heart failure: Secondary | ICD-10-CM | POA: Insufficient documentation

## 2016-01-24 DIAGNOSIS — Z79899 Other long term (current) drug therapy: Secondary | ICD-10-CM | POA: Insufficient documentation

## 2016-01-24 LAB — BASIC METABOLIC PANEL
Anion gap: 3 — ABNORMAL LOW (ref 5–15)
BUN: 38 mg/dL — ABNORMAL HIGH (ref 6–20)
CHLORIDE: 109 mmol/L (ref 101–111)
CO2: 28 mmol/L (ref 22–32)
CREATININE: 1.34 mg/dL — AB (ref 0.61–1.24)
Calcium: 9.6 mg/dL (ref 8.9–10.3)
GFR calc Af Amer: 58 mL/min — ABNORMAL LOW (ref >60)
GFR, EST NON AFRICAN AMERICAN: 50 mL/min — AB (ref >60)
Glucose, Bld: 132 mg/dL — ABNORMAL HIGH (ref 65–99)
POTASSIUM: 4.5 mmol/L (ref 3.5–5.1)
SODIUM: 140 mmol/L (ref 135–145)

## 2016-01-30 ENCOUNTER — Ambulatory Visit (INDEPENDENT_AMBULATORY_CARE_PROVIDER_SITE_OTHER): Payer: Medicare Other

## 2016-01-30 DIAGNOSIS — I4891 Unspecified atrial fibrillation: Secondary | ICD-10-CM

## 2016-01-30 DIAGNOSIS — I059 Rheumatic mitral valve disease, unspecified: Secondary | ICD-10-CM | POA: Diagnosis not present

## 2016-01-30 DIAGNOSIS — Z5181 Encounter for therapeutic drug level monitoring: Secondary | ICD-10-CM

## 2016-01-30 DIAGNOSIS — Z9889 Other specified postprocedural states: Secondary | ICD-10-CM | POA: Diagnosis not present

## 2016-01-30 DIAGNOSIS — I482 Chronic atrial fibrillation, unspecified: Secondary | ICD-10-CM

## 2016-01-30 DIAGNOSIS — Z7901 Long term (current) use of anticoagulants: Secondary | ICD-10-CM

## 2016-01-30 LAB — POCT INR: INR: 3.2

## 2016-01-31 ENCOUNTER — Inpatient Hospital Stay
Admission: EM | Admit: 2016-01-31 | Discharge: 2016-02-01 | DRG: 871 | Disposition: A | Discharge status: Other Acute Inpatient Hospital | Payer: Medicare Other | Attending: Internal Medicine | Admitting: Internal Medicine

## 2016-01-31 ENCOUNTER — Emergency Department: Payer: Medicare Other

## 2016-01-31 ENCOUNTER — Encounter: Payer: Self-pay | Admitting: Emergency Medicine

## 2016-01-31 DIAGNOSIS — Z888 Allergy status to other drugs, medicaments and biological substances status: Secondary | ICD-10-CM

## 2016-01-31 DIAGNOSIS — Z885 Allergy status to narcotic agent status: Secondary | ICD-10-CM

## 2016-01-31 DIAGNOSIS — I110 Hypertensive heart disease with heart failure: Secondary | ICD-10-CM | POA: Diagnosis not present

## 2016-01-31 DIAGNOSIS — E119 Type 2 diabetes mellitus without complications: Secondary | ICD-10-CM | POA: Diagnosis present

## 2016-01-31 DIAGNOSIS — Z8546 Personal history of malignant neoplasm of prostate: Secondary | ICD-10-CM

## 2016-01-31 DIAGNOSIS — I252 Old myocardial infarction: Secondary | ICD-10-CM

## 2016-01-31 DIAGNOSIS — J189 Pneumonia, unspecified organism: Secondary | ICD-10-CM | POA: Diagnosis not present

## 2016-01-31 DIAGNOSIS — R6521 Severe sepsis with septic shock: Secondary | ICD-10-CM | POA: Diagnosis not present

## 2016-01-31 DIAGNOSIS — Z7901 Long term (current) use of anticoagulants: Secondary | ICD-10-CM

## 2016-01-31 DIAGNOSIS — M109 Gout, unspecified: Secondary | ICD-10-CM | POA: Diagnosis present

## 2016-01-31 DIAGNOSIS — I5022 Chronic systolic (congestive) heart failure: Secondary | ICD-10-CM | POA: Diagnosis not present

## 2016-01-31 DIAGNOSIS — I4891 Unspecified atrial fibrillation: Secondary | ICD-10-CM | POA: Diagnosis not present

## 2016-01-31 DIAGNOSIS — I482 Chronic atrial fibrillation: Secondary | ICD-10-CM | POA: Diagnosis present

## 2016-01-31 DIAGNOSIS — A419 Sepsis, unspecified organism: Principal | ICD-10-CM | POA: Diagnosis present

## 2016-01-31 DIAGNOSIS — Z79899 Other long term (current) drug therapy: Secondary | ICD-10-CM

## 2016-01-31 DIAGNOSIS — J440 Chronic obstructive pulmonary disease with acute lower respiratory infection: Secondary | ICD-10-CM | POA: Diagnosis present

## 2016-01-31 DIAGNOSIS — Z951 Presence of aortocoronary bypass graft: Secondary | ICD-10-CM

## 2016-01-31 DIAGNOSIS — N179 Acute kidney failure, unspecified: Secondary | ICD-10-CM | POA: Diagnosis not present

## 2016-01-31 DIAGNOSIS — Z952 Presence of prosthetic heart valve: Secondary | ICD-10-CM

## 2016-01-31 DIAGNOSIS — Z87891 Personal history of nicotine dependence: Secondary | ICD-10-CM

## 2016-01-31 DIAGNOSIS — Z794 Long term (current) use of insulin: Secondary | ICD-10-CM

## 2016-01-31 DIAGNOSIS — Y95 Nosocomial condition: Secondary | ICD-10-CM | POA: Diagnosis present

## 2016-01-31 DIAGNOSIS — E785 Hyperlipidemia, unspecified: Secondary | ICD-10-CM | POA: Diagnosis present

## 2016-01-31 DIAGNOSIS — R0602 Shortness of breath: Secondary | ICD-10-CM | POA: Diagnosis not present

## 2016-01-31 DIAGNOSIS — I2510 Atherosclerotic heart disease of native coronary artery without angina pectoris: Secondary | ICD-10-CM | POA: Diagnosis present

## 2016-01-31 DIAGNOSIS — I429 Cardiomyopathy, unspecified: Secondary | ICD-10-CM | POA: Diagnosis present

## 2016-01-31 DIAGNOSIS — H9190 Unspecified hearing loss, unspecified ear: Secondary | ICD-10-CM | POA: Diagnosis present

## 2016-01-31 DIAGNOSIS — I248 Other forms of acute ischemic heart disease: Secondary | ICD-10-CM | POA: Diagnosis present

## 2016-01-31 DIAGNOSIS — H409 Unspecified glaucoma: Secondary | ICD-10-CM | POA: Diagnosis present

## 2016-01-31 DIAGNOSIS — Z7982 Long term (current) use of aspirin: Secondary | ICD-10-CM

## 2016-01-31 DIAGNOSIS — Z882 Allergy status to sulfonamides status: Secondary | ICD-10-CM

## 2016-01-31 LAB — TROPONIN I
Troponin I: 0.1 ng/mL — ABNORMAL HIGH (ref <0.031)
Troponin I: 0.14 ng/mL — ABNORMAL HIGH (ref <0.031)

## 2016-01-31 LAB — COMPREHENSIVE METABOLIC PANEL
ALBUMIN: 4.3 g/dL (ref 3.5–5.0)
ALT: 44 U/L (ref 17–63)
ANION GAP: 12 (ref 5–15)
AST: 30 U/L (ref 15–41)
Alkaline Phosphatase: 58 U/L (ref 38–126)
BILIRUBIN TOTAL: 0.6 mg/dL (ref 0.3–1.2)
BUN: 35 mg/dL — ABNORMAL HIGH (ref 6–20)
CO2: 20 mmol/L — AB (ref 22–32)
Calcium: 9 mg/dL (ref 8.9–10.3)
Chloride: 109 mmol/L (ref 101–111)
Creatinine, Ser: 1.44 mg/dL — ABNORMAL HIGH (ref 0.61–1.24)
GFR calc non Af Amer: 46 mL/min — ABNORMAL LOW (ref >60)
GFR, EST AFRICAN AMERICAN: 53 mL/min — AB (ref >60)
GLUCOSE: 163 mg/dL — AB (ref 65–99)
POTASSIUM: 4.2 mmol/L (ref 3.5–5.1)
SODIUM: 141 mmol/L (ref 135–145)
TOTAL PROTEIN: 7 g/dL (ref 6.5–8.1)

## 2016-01-31 LAB — MAGNESIUM: Magnesium: 2.2 mg/dL (ref 1.7–2.4)

## 2016-01-31 LAB — CBC WITH DIFFERENTIAL/PLATELET
BASOS PCT: 0 %
Basophils Absolute: 0 10*3/uL (ref 0–0.1)
EOS ABS: 0.1 10*3/uL (ref 0–0.7)
Eosinophils Relative: 1 %
HCT: 37.1 % — ABNORMAL LOW (ref 40.0–52.0)
Hemoglobin: 12 g/dL — ABNORMAL LOW (ref 13.0–18.0)
Lymphocytes Relative: 10 %
Lymphs Abs: 1.1 10*3/uL (ref 1.0–3.6)
MCH: 29.4 pg (ref 26.0–34.0)
MCHC: 32.3 g/dL (ref 32.0–36.0)
MCV: 90.9 fL (ref 80.0–100.0)
MONO ABS: 1 10*3/uL (ref 0.2–1.0)
MONOS PCT: 9 %
Neutro Abs: 9.5 10*3/uL — ABNORMAL HIGH (ref 1.4–6.5)
Neutrophils Relative %: 80 %
Platelets: 158 10*3/uL (ref 150–440)
RBC: 4.09 MIL/uL — ABNORMAL LOW (ref 4.40–5.90)
RDW: 16.4 % — AB (ref 11.5–14.5)
WBC: 11.8 10*3/uL — ABNORMAL HIGH (ref 3.8–10.6)

## 2016-01-31 LAB — DIGOXIN LEVEL: Digoxin Level: 0.5 ng/mL — ABNORMAL LOW (ref 0.8–2.0)

## 2016-01-31 LAB — PROTIME-INR
INR: 2.8
PROTHROMBIN TIME: 29.1 s — AB (ref 11.4–15.0)

## 2016-01-31 LAB — LACTIC ACID, PLASMA: Lactic Acid, Venous: 1.3 mmol/L (ref 0.5–2.0)

## 2016-01-31 LAB — BRAIN NATRIURETIC PEPTIDE: B NATRIURETIC PEPTIDE 5: 1330 pg/mL — AB (ref 0.0–100.0)

## 2016-01-31 MED ORDER — ONDANSETRON HCL 4 MG/2ML IJ SOLN
4.0000 mg | Freq: Once | INTRAMUSCULAR | Status: AC
  Administered 2016-01-31: 4 mg via INTRAVENOUS

## 2016-01-31 MED ORDER — DOBUTAMINE IN D5W 4-5 MG/ML-% IV SOLN
2.5000 ug/kg/min | INTRAVENOUS | Status: DC
  Administered 2016-01-31: 5 ug/kg/min via INTRAVENOUS
  Filled 2016-01-31: qty 250

## 2016-01-31 MED ORDER — DILTIAZEM HCL 25 MG/5ML IV SOLN
INTRAVENOUS | Status: AC
  Administered 2016-01-31: 20 mg via INTRAVENOUS
  Filled 2016-01-31: qty 5

## 2016-01-31 MED ORDER — DILTIAZEM HCL 100 MG IV SOLR
5.0000 mg/h | INTRAVENOUS | Status: DC
  Filled 2016-01-31: qty 100

## 2016-01-31 MED ORDER — ACETAMINOPHEN 500 MG PO TABS
1000.0000 mg | ORAL_TABLET | Freq: Once | ORAL | Status: AC
  Administered 2016-01-31: 1000 mg via ORAL
  Filled 2016-01-31: qty 2

## 2016-01-31 MED ORDER — LORAZEPAM 2 MG/ML IJ SOLN
1.0000 mg | Freq: Once | INTRAMUSCULAR | Status: AC
  Administered 2016-01-31: 1 mg via INTRAVENOUS
  Filled 2016-01-31: qty 1

## 2016-01-31 MED ORDER — DILTIAZEM HCL 25 MG/5ML IV SOLN
20.0000 mg | Freq: Once | INTRAVENOUS | Status: AC
  Administered 2016-01-31: 20 mg via INTRAVENOUS

## 2016-01-31 MED ORDER — ONDANSETRON HCL 4 MG/2ML IJ SOLN
INTRAMUSCULAR | Status: AC
  Filled 2016-01-31: qty 2

## 2016-01-31 MED ORDER — DILTIAZEM HCL 100 MG IV SOLR
10.0000 mg/h | Freq: Once | INTRAVENOUS | Status: AC
  Administered 2016-01-31: 10 mg/h via INTRAVENOUS

## 2016-01-31 MED ORDER — WARFARIN SODIUM 2.5 MG PO TABS
2.5000 mg | ORAL_TABLET | Freq: Once | ORAL | Status: AC
  Administered 2016-01-31: 2.5 mg via ORAL
  Filled 2016-01-31: qty 1

## 2016-01-31 MED ORDER — EPINEPHRINE HCL 1 MG/ML IJ SOLN: 1.0000 mg | Freq: Once | INTRAMUSCULAR | Status: DC

## 2016-01-31 MED ORDER — PIPERACILLIN-TAZOBACTAM 3.375 G IVPB
3.3750 g | Freq: Once | INTRAVENOUS | Status: AC
  Administered 2016-01-31: 3.375 g via INTRAVENOUS
  Filled 2016-01-31: qty 50

## 2016-01-31 NOTE — ED Notes (Signed)
Only gave .5 mg of Ativan at daughters request - Saving other half in the event he needs it

## 2016-01-31 NOTE — ED Notes (Signed)
Manual BP 100/58

## 2016-01-31 NOTE — ED Notes (Addendum)
MD at bedside. 

## 2016-01-31 NOTE — ED Notes (Signed)
Pt daughter requests not to titrate Dobutamine to 80mc/kg per MD order, Daughter asked this RN "please don't tell the doctor though". MD notified, Charge RN notified. Pt stable at this time. Dobutamine drip stays at 12mc/kg per request at this time

## 2016-01-31 NOTE — ED Notes (Signed)
Dr Marcelene Butte notified of 100.5 temp

## 2016-01-31 NOTE — ED Notes (Signed)
Dr. Owens Shark and this RN to bedside. Clinical information reviewed with patient and son-in-law. Patient's VS trends reviewed by MD. Patient re-assessed for clinical appropriateness to transfer to the [facility] CCU at this time versus remaining in the ED. MD explained to patient and family that it is in the best interest of the patient that he be moved from the ED to CCU for continued monitoring and care. MD wanting to order further studies and medications for patient prior to final disposition being made. MD with [facility] for non-contrast CT scan of the chest in addition to a pharmacy consult to dose Vancomycin; orders entered and carried by this RN.

## 2016-01-31 NOTE — ED Notes (Signed)
Dr Marcelene Butte notified of troponin of 0.10

## 2016-01-31 NOTE — ED Notes (Signed)
Pt brought to room assisted by daughter - c/o chest pain for the last hour and acute SOB for the last 45 minutes - Hx of afib with HR 130-150 today and BP 77/53 at home

## 2016-01-31 NOTE — ED Notes (Signed)
MD at bedside. 

## 2016-02-01 ENCOUNTER — Ambulatory Visit (HOSPITAL_COMMUNITY)
Admission: AD | Admit: 2016-02-01 | Discharge: 2016-02-01 | Disposition: A | Discharge status: Home / Self Care | Payer: Medicare Other | Source: Other Acute Inpatient Hospital | Attending: Internal Medicine | Admitting: Internal Medicine

## 2016-02-01 ENCOUNTER — Encounter: Payer: Self-pay | Admitting: Internal Medicine

## 2016-02-01 DIAGNOSIS — Z8709 Personal history of other diseases of the respiratory system: Secondary | ICD-10-CM | POA: Diagnosis not present

## 2016-02-01 DIAGNOSIS — I5023 Acute on chronic systolic (congestive) heart failure: Secondary | ICD-10-CM | POA: Diagnosis not present

## 2016-02-01 DIAGNOSIS — A419 Sepsis, unspecified organism: Secondary | ICD-10-CM | POA: Diagnosis present

## 2016-02-01 DIAGNOSIS — Z952 Presence of prosthetic heart valve: Secondary | ICD-10-CM | POA: Diagnosis not present

## 2016-02-01 DIAGNOSIS — H9190 Unspecified hearing loss, unspecified ear: Secondary | ICD-10-CM | POA: Diagnosis present

## 2016-02-01 DIAGNOSIS — E119 Type 2 diabetes mellitus without complications: Secondary | ICD-10-CM | POA: Diagnosis present

## 2016-02-01 DIAGNOSIS — E7800 Pure hypercholesterolemia, unspecified: Secondary | ICD-10-CM | POA: Diagnosis present

## 2016-02-01 DIAGNOSIS — J189 Pneumonia, unspecified organism: Secondary | ICD-10-CM | POA: Insufficient documentation

## 2016-02-01 DIAGNOSIS — I429 Cardiomyopathy, unspecified: Secondary | ICD-10-CM | POA: Diagnosis present

## 2016-02-01 DIAGNOSIS — Z794 Long term (current) use of insulin: Secondary | ICD-10-CM | POA: Diagnosis not present

## 2016-02-01 DIAGNOSIS — I4891 Unspecified atrial fibrillation: Secondary | ICD-10-CM | POA: Diagnosis not present

## 2016-02-01 DIAGNOSIS — Z7982 Long term (current) use of aspirin: Secondary | ICD-10-CM | POA: Diagnosis not present

## 2016-02-01 DIAGNOSIS — Z951 Presence of aortocoronary bypass graft: Secondary | ICD-10-CM | POA: Diagnosis not present

## 2016-02-01 DIAGNOSIS — J440 Chronic obstructive pulmonary disease with acute lower respiratory infection: Secondary | ICD-10-CM | POA: Diagnosis present

## 2016-02-01 DIAGNOSIS — N179 Acute kidney failure, unspecified: Secondary | ICD-10-CM | POA: Diagnosis present

## 2016-02-01 DIAGNOSIS — H409 Unspecified glaucoma: Secondary | ICD-10-CM | POA: Diagnosis present

## 2016-02-01 DIAGNOSIS — Z882 Allergy status to sulfonamides status: Secondary | ICD-10-CM | POA: Diagnosis not present

## 2016-02-01 DIAGNOSIS — I110 Hypertensive heart disease with heart failure: Secondary | ICD-10-CM | POA: Diagnosis present

## 2016-02-01 DIAGNOSIS — I255 Ischemic cardiomyopathy: Secondary | ICD-10-CM | POA: Diagnosis present

## 2016-02-01 DIAGNOSIS — Z7901 Long term (current) use of anticoagulants: Secondary | ICD-10-CM | POA: Diagnosis not present

## 2016-02-01 DIAGNOSIS — I248 Other forms of acute ischemic heart disease: Secondary | ICD-10-CM | POA: Diagnosis present

## 2016-02-01 DIAGNOSIS — E1122 Type 2 diabetes mellitus with diabetic chronic kidney disease: Secondary | ICD-10-CM | POA: Diagnosis present

## 2016-02-01 DIAGNOSIS — J181 Lobar pneumonia, unspecified organism: Secondary | ICD-10-CM | POA: Diagnosis not present

## 2016-02-01 DIAGNOSIS — I2510 Atherosclerotic heart disease of native coronary artery without angina pectoris: Secondary | ICD-10-CM | POA: Diagnosis present

## 2016-02-01 DIAGNOSIS — J9601 Acute respiratory failure with hypoxia: Secondary | ICD-10-CM | POA: Diagnosis not present

## 2016-02-01 DIAGNOSIS — J811 Chronic pulmonary edema: Secondary | ICD-10-CM | POA: Diagnosis not present

## 2016-02-01 DIAGNOSIS — Z8546 Personal history of malignant neoplasm of prostate: Secondary | ICD-10-CM | POA: Diagnosis not present

## 2016-02-01 DIAGNOSIS — M109 Gout, unspecified: Secondary | ICD-10-CM | POA: Diagnosis present

## 2016-02-01 DIAGNOSIS — Z923 Personal history of irradiation: Secondary | ICD-10-CM | POA: Diagnosis not present

## 2016-02-01 DIAGNOSIS — Z9981 Dependence on supplemental oxygen: Secondary | ICD-10-CM | POA: Diagnosis not present

## 2016-02-01 DIAGNOSIS — Z955 Presence of coronary angioplasty implant and graft: Secondary | ICD-10-CM | POA: Diagnosis not present

## 2016-02-01 DIAGNOSIS — R0602 Shortness of breath: Secondary | ICD-10-CM | POA: Diagnosis not present

## 2016-02-01 DIAGNOSIS — Z888 Allergy status to other drugs, medicaments and biological substances status: Secondary | ICD-10-CM | POA: Diagnosis not present

## 2016-02-01 DIAGNOSIS — Z79899 Other long term (current) drug therapy: Secondary | ICD-10-CM | POA: Diagnosis not present

## 2016-02-01 DIAGNOSIS — R6521 Severe sepsis with septic shock: Secondary | ICD-10-CM | POA: Diagnosis present

## 2016-02-01 DIAGNOSIS — I482 Chronic atrial fibrillation: Secondary | ICD-10-CM | POA: Diagnosis present

## 2016-02-01 DIAGNOSIS — I252 Old myocardial infarction: Secondary | ICD-10-CM | POA: Diagnosis not present

## 2016-02-01 DIAGNOSIS — Y95 Nosocomial condition: Secondary | ICD-10-CM | POA: Diagnosis present

## 2016-02-01 DIAGNOSIS — I5022 Chronic systolic (congestive) heart failure: Secondary | ICD-10-CM | POA: Diagnosis present

## 2016-02-01 DIAGNOSIS — J9809 Other diseases of bronchus, not elsewhere classified: Secondary | ICD-10-CM | POA: Diagnosis not present

## 2016-02-01 DIAGNOSIS — J449 Chronic obstructive pulmonary disease, unspecified: Secondary | ICD-10-CM | POA: Diagnosis present

## 2016-02-01 DIAGNOSIS — E785 Hyperlipidemia, unspecified: Secondary | ICD-10-CM | POA: Diagnosis present

## 2016-02-01 DIAGNOSIS — R1312 Dysphagia, oropharyngeal phase: Secondary | ICD-10-CM | POA: Diagnosis not present

## 2016-02-01 DIAGNOSIS — Z885 Allergy status to narcotic agent status: Secondary | ICD-10-CM | POA: Diagnosis not present

## 2016-02-01 DIAGNOSIS — N183 Chronic kidney disease, stage 3 (moderate): Secondary | ICD-10-CM | POA: Diagnosis present

## 2016-02-01 DIAGNOSIS — Z933 Colostomy status: Secondary | ICD-10-CM | POA: Diagnosis not present

## 2016-02-01 DIAGNOSIS — Z87891 Personal history of nicotine dependence: Secondary | ICD-10-CM | POA: Diagnosis not present

## 2016-02-01 DIAGNOSIS — I130 Hypertensive heart and chronic kidney disease with heart failure and stage 1 through stage 4 chronic kidney disease, or unspecified chronic kidney disease: Secondary | ICD-10-CM | POA: Diagnosis present

## 2016-02-01 DIAGNOSIS — Z954 Presence of other heart-valve replacement: Secondary | ICD-10-CM | POA: Diagnosis not present

## 2016-02-01 LAB — CBC
HCT: 30.5 % — ABNORMAL LOW (ref 40.0–52.0)
Hemoglobin: 10 g/dL — ABNORMAL LOW (ref 13.0–18.0)
MCH: 29.1 pg (ref 26.0–34.0)
MCHC: 32.9 g/dL (ref 32.0–36.0)
MCV: 88.4 fL (ref 80.0–100.0)
PLATELETS: 118 10*3/uL — AB (ref 150–440)
RBC: 3.45 MIL/uL — AB (ref 4.40–5.90)
RDW: 16.4 % — AB (ref 11.5–14.5)
WBC: 10.5 10*3/uL (ref 3.8–10.6)

## 2016-02-01 LAB — GLUCOSE, CAPILLARY
GLUCOSE-CAPILLARY: 178 mg/dL — AB (ref 65–99)
GLUCOSE-CAPILLARY: 305 mg/dL — AB (ref 65–99)
GLUCOSE-CAPILLARY: 155 mg/dL — AB (ref 65–99)
Glucose-Capillary: 210 mg/dL — ABNORMAL HIGH (ref 65–99)

## 2016-02-01 LAB — TROPONIN I
TROPONIN I: 0.14 ng/mL — AB (ref <0.031)
TROPONIN I: 0.09 ng/mL — AB (ref <0.031)

## 2016-02-01 LAB — BASIC METABOLIC PANEL
Anion gap: 11 (ref 5–15)
BUN: 43 mg/dL — AB (ref 6–20)
CALCIUM: 8.4 mg/dL — AB (ref 8.9–10.3)
CHLORIDE: 109 mmol/L (ref 101–111)
CO2: 17 mmol/L — ABNORMAL LOW (ref 22–32)
CREATININE: 1.73 mg/dL — AB (ref 0.61–1.24)
GFR, EST AFRICAN AMERICAN: 42 mL/min — AB (ref >60)
GFR, EST NON AFRICAN AMERICAN: 37 mL/min — AB (ref >60)
Glucose, Bld: 242 mg/dL — ABNORMAL HIGH (ref 65–99)
Potassium: 4.7 mmol/L (ref 3.5–5.1)
SODIUM: 137 mmol/L (ref 135–145)

## 2016-02-01 LAB — PROCALCITONIN: Procalcitonin: 4.34 ng/mL

## 2016-02-01 LAB — INFLUENZA PANEL BY PCR (TYPE A & B)
H1N1 flu by pcr: NOT DETECTED
Influenza A By PCR: NEGATIVE
Influenza B By PCR: NEGATIVE

## 2016-02-01 LAB — LACTIC ACID, PLASMA: Lactic Acid, Venous: 0.9 mmol/L (ref 0.5–2.0)

## 2016-02-01 LAB — MRSA PCR SCREENING: MRSA BY PCR: NEGATIVE

## 2016-02-01 MED ORDER — LATANOPROST 0.005 % OP SOLN
1.0000 [drp] | Freq: Every day | OPHTHALMIC | Status: DC
Start: 2016-02-01 — End: 2016-02-01
  Filled 2016-02-01: qty 2.5

## 2016-02-01 MED ORDER — SODIUM CHLORIDE 0.9% FLUSH: 3.0000 mL | INTRAVENOUS | Status: DC | PRN

## 2016-02-01 MED ORDER — VANCOMYCIN HCL IN DEXTROSE 1-5 GM/200ML-% IV SOLN
1000.0000 mg | Freq: Once | INTRAVENOUS | Status: AC
  Administered 2016-02-01: 1000 mg via INTRAVENOUS

## 2016-02-01 MED ORDER — ONDANSETRON HCL 4 MG PO TABS: 4.0000 mg | ORAL_TABLET | Freq: Four times a day (QID) | ORAL | Status: DC | PRN

## 2016-02-01 MED ORDER — ACETAMINOPHEN 650 MG RE SUPP: 650.0000 mg | Freq: Four times a day (QID) | RECTAL | Status: DC | PRN

## 2016-02-01 MED ORDER — WARFARIN - PHARMACIST DOSING INPATIENT
Freq: Every day | Status: DC
  Administered 2016-02-01: 18:00:00

## 2016-02-01 MED ORDER — VANCOMYCIN HCL IN DEXTROSE 1-5 GM/200ML-% IV SOLN
INTRAVENOUS | Status: AC
  Administered 2016-02-01: 1000 mg via INTRAVENOUS
  Filled 2016-02-01: qty 200

## 2016-02-01 MED ORDER — VANCOMYCIN HCL IN DEXTROSE 1-5 GM/200ML-% IV SOLN
1000.0000 mg | INTRAVENOUS | Status: DC
Start: 2016-02-01 — End: 2016-02-01
  Filled 2016-02-01: qty 200

## 2016-02-01 MED ORDER — DOXYCYCLINE HYCLATE 100 MG IV SOLR
100.0000 mg | Freq: Two times a day (BID) | INTRAVENOUS | Status: DC
  Administered 2016-02-01: 100 mg via INTRAVENOUS
  Filled 2016-02-01 (×2): qty 100

## 2016-02-01 MED ORDER — LORAZEPAM 2 MG/ML IJ SOLN: 0.5000 mg | Freq: Four times a day (QID) | INTRAMUSCULAR | Status: DC | PRN

## 2016-02-01 MED ORDER — SODIUM CHLORIDE 0.9% FLUSH
3.0000 mL | Freq: Two times a day (BID) | INTRAVENOUS | Status: DC
  Administered 2016-02-01 (×2): 3 mL via INTRAVENOUS

## 2016-02-01 MED ORDER — ONDANSETRON HCL 4 MG/2ML IJ SOLN: 4.0000 mg | Freq: Four times a day (QID) | INTRAMUSCULAR | Status: DC | PRN

## 2016-02-01 MED ORDER — DIGOXIN 125 MCG PO TABS
0.1250 mg | ORAL_TABLET | Freq: Every day | ORAL | Status: DC
  Administered 2016-02-01: 0.125 mg via ORAL
  Filled 2016-02-01: qty 1

## 2016-02-01 MED ORDER — WARFARIN SODIUM 2.5 MG PO TABS
2.5000 mg | ORAL_TABLET | ORAL | Status: DC
  Administered 2016-02-01: 2.5 mg via ORAL
  Filled 2016-02-01: qty 1

## 2016-02-01 MED ORDER — IPRATROPIUM-ALBUTEROL 0.5-2.5 (3) MG/3ML IN SOLN: 3.0000 mL | RESPIRATORY_TRACT | Status: DC | PRN

## 2016-02-01 MED ORDER — TAMSULOSIN HCL 0.4 MG PO CAPS
0.4000 mg | ORAL_CAPSULE | Freq: Every day | ORAL | Status: DC
  Administered 2016-02-01: 0.4 mg via ORAL
  Filled 2016-02-01: qty 1

## 2016-02-01 MED ORDER — DEXTROSE 5 % IV SOLN
1.0000 g | INTRAVENOUS | Status: DC
  Administered 2016-02-01: 1 g via INTRAVENOUS
  Filled 2016-02-01 (×2): qty 10

## 2016-02-01 MED ORDER — METOPROLOL SUCCINATE ER 50 MG PO TB24: 50.0000 mg | ORAL_TABLET | Freq: Every day | ORAL | Status: DC

## 2016-02-01 MED ORDER — INSULIN ASPART 100 UNIT/ML ~~LOC~~ SOLN: 0.0000 [IU] | Freq: Every day | SUBCUTANEOUS | Status: DC

## 2016-02-01 MED ORDER — SODIUM CHLORIDE 0.9 % IV SOLN: 250.0000 mL | INTRAVENOUS | Status: DC | PRN

## 2016-02-01 MED ORDER — INSULIN ASPART 100 UNIT/ML ~~LOC~~ SOLN
0.0000 [IU] | Freq: Three times a day (TID) | SUBCUTANEOUS | Status: DC
  Administered 2016-02-01: 3 [IU] via SUBCUTANEOUS
  Administered 2016-02-01: 11 [IU] via SUBCUTANEOUS
  Administered 2016-02-01: 3 [IU] via SUBCUTANEOUS
  Filled 2016-02-01 (×2): qty 3
  Filled 2016-02-01: qty 11

## 2016-02-01 MED ORDER — POLYETHYLENE GLYCOL 3350 17 G PO PACK: 17.0000 g | PACK | Freq: Every day | ORAL | Status: DC | PRN

## 2016-02-01 MED ORDER — BUDESONIDE-FORMOTEROL FUMARATE 160-4.5 MCG/ACT IN AERO
2.0000 | INHALATION_SPRAY | Freq: Two times a day (BID) | RESPIRATORY_TRACT | Status: DC
  Administered 2016-02-01: 2 via RESPIRATORY_TRACT
  Filled 2016-02-01: qty 6

## 2016-02-01 MED ORDER — METHYLPREDNISOLONE SODIUM SUCC 125 MG IJ SOLR
60.0000 mg | INTRAMUSCULAR | Status: DC
  Administered 2016-02-01: 60 mg via INTRAVENOUS
  Filled 2016-02-01: qty 2

## 2016-02-01 MED ORDER — SIMVASTATIN 40 MG PO TABS: 40.0000 mg | ORAL_TABLET | Freq: Every day | ORAL | Status: DC

## 2016-02-01 MED ORDER — ACETAMINOPHEN 325 MG PO TABS: 650.0000 mg | ORAL_TABLET | Freq: Four times a day (QID) | ORAL | Status: DC | PRN

## 2016-02-01 MED ORDER — SODIUM CHLORIDE 0.9% FLUSH: 3.0000 mL | Freq: Two times a day (BID) | INTRAVENOUS | Status: DC

## 2016-02-01 MED ORDER — DOXYCYCLINE HYCLATE 100 MG PO TABS
100.0000 mg | ORAL_TABLET | Freq: Two times a day (BID) | ORAL | Status: DC
  Administered 2016-02-01: 100 mg via ORAL
  Filled 2016-02-01: qty 1

## 2016-02-01 MED ORDER — PIPERACILLIN-TAZOBACTAM 3.375 G IVPB
3.3750 g | Freq: Three times a day (TID) | INTRAVENOUS | Status: DC
  Administered 2016-02-01: 3.375 g via INTRAVENOUS
  Filled 2016-02-01 (×3): qty 50

## 2016-02-01 MED ORDER — ASPIRIN EC 81 MG PO TBEC
81.0000 mg | DELAYED_RELEASE_TABLET | Freq: Every day | ORAL | Status: DC
  Administered 2016-02-01: 81 mg via ORAL
  Filled 2016-02-01: qty 1

## 2016-02-01 MED ORDER — WARFARIN SODIUM 5 MG PO TABS: 5.0000 mg | ORAL_TABLET | ORAL | Status: DC

## 2016-02-01 NOTE — ED Provider Notes (Signed)
Time Seen: Approximately 1612  I have reviewed the triage notes  Chief Complaint: Shortness of Breath   History of Present Illness: [name] is a 75 y.o. male who was brought immediately to bed #5 for evaluation. Patient's had a recent history of treatment for pneumonia. She is finished a course of Levaquin for wet cough, etc. Patient has a significant history of chronic atrial fibrillation and is on Coumadin and also has a history of congestive heart failure with an ejection fraction approximately 25%.atient had sudden onset tachycardia and felt very lightheaded and somewhat diaphoretic. Patient was found to be in a rapid ventricular rate greater than 150. Patient denies any chest pain.   Past Medical History  Diagnosis Date  . Chronic atrial fibrillation (HCC)     a. warfarin; b. CHADSVASc 6 (CHF, HTN, age x 2, DM, vascular disease) giving him an estimated annual stroke risk of 9.8%  . Hypertension   . Hyperkalemia   . Chronic systolic CHF (congestive heart failure) ([place])     a. echo 10/2014: EF 30-35%, AK of inf & post myocardium, HK of apical & lat wall, ant & antsept wall best preserved, mitral: mechanical prosthesis was present & well seated, LA moderately dilated, RV systolic fxn mildly reduced, PASP nl  . Coronary artery disease     Cath 2005- patent LIMA-LAD, SVG-OM grafts, moderate MR  . Prostate cancer ([place]) 04/2009  . Hyperlipidemia   . COPD (chronic obstructive pulmonary disease) ([place])     URI 9/12  . DM type 2 (diabetes mellitus, type 2) ([place])   . PNA (pneumonia) 03/2010  . Glaucoma   . Hard of hearing   . Myocardial infarction ([place])   . Gout   . Adenomatous polyps   . Diverticulosis     left side   . H/O mitral valve prolapse   . Sigmoid diverticulitis     perforated    Patient Active Problem List   Diagnosis Date Noted  . Acute on chronic systolic (congestive) heart failure ([place]) 11/20/2015  . Aortic stenosis 11/20/2015  . Encounter for therapeutic drug  monitoring 02/10/2014  . Bradycardia 10/28/2013  . Aspiration into respiratory tract 03/14/2013  . Mediastinal lymphadenopathy 03/01/2013  . Pneumonia 11/23/2012  . Hypoxemia 11/09/2012  . Preoperative cardiovascular examination 09/14/2012  . Personal history of adenomatous colonic polyps 04/30/2012  . 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 COPD (chronic obstructive pulmonary disease) ([place]) 09/15/2011  . Mitral valve disorder 03/11/2011  . Long term current use of anticoagulant 03/11/2011  . MITRAL VALVE REPLACEMENT, HX OF 07/29/2010  . Diabetes mellitus type 2 with complications ([place]) 123456  . Hyperlipidemia 01/25/2010  . Essential hypertension 05/01/2009  . CAD, ARTERY BYPASS GRAFT 05/01/2009  . Atrial fibrillation ([place]) 05/01/2009  . Chronic systolic heart failure ([place]) 05/01/2009    Past Surgical History  Procedure Laterality Date  . Coronary artery bypass graft  1989    LIMA-LAD, SVG-OM  . Mitral valve replacement  2007    Mechanical, performed at [facility]  . Hernia repair    . External ear surgery      drum  . Colon resection  01/12/2012    sigmoid colectomy and colostomy for diverticulitis  . Cataract extraction    . Colonoscopy  11/23/2008    Past Surgical History  Procedure Laterality Date  . Coronary artery bypass graft  1989    LIMA-LAD, SVG-OM  . Mitral valve replacement  2007    Mechanical, performed at [facility]  . Hernia repair    . External ear surgery      drum  . Colon resection  01/12/2012    sigmoid colectomy and colostomy for diverticulitis  . Cataract extraction    . Colonoscopy  11/23/2008    Current Outpatient Rx  Name  Route  Sig  Dispense  Refill  . aspirin EC 81 MG tablet   Oral   Take 1 tablet (81 mg total) by mouth daily.         . beta carotene w/minerals (OCUVITE) tablet   Oral   Take 1 tablet by mouth daily.         . budesonide-formoterol (SYMBICORT)  160-4.5 MCG/ACT inhaler   Inhalation   Inhale 2 puffs into the lungs 2 (two) times daily.         . Cholecalciferol (VITAMIN D) 2000 UNITS tablet   Oral   Take 2,000 Units by mouth daily.         . digoxin (DIGOX) 0.125 MG tablet      Take 1 tablet by mouth  daily   90 tablet   3   . HYDROcodone-acetaminophen (NORCO/VICODIN) 5-325 MG tablet      Take 1-2 tabs every 4-6 hrs PRN pain         . latanoprost (XALATAN) 0.005 % ophthalmic solution   Both Eyes   Place 1 drop into both eyes at bedtime.          Marland Kitchen levofloxacin (LEVAQUIN) 750 MG tablet   Oral   Take 1 tablet (750 mg total) by mouth daily.   7 tablet   0   . losartan (COZAAR) 100 MG tablet   Oral   Take 100 mg by mouth daily.         . metFORMIN (GLUCOPHAGE) 500 MG tablet   Oral   Take 1,000 mg by mouth 2 (two) times daily with a meal.         . metFORMIN (GLUCOPHAGE) 500 MG tablet      Take 2 tablets by mouth  twice a day         . metoprolol succinate (TOPROL-XL) 50 MG 24 hr tablet   Oral   Take 50 mg by mouth daily. Take with or immediately following a meal.         . oxyCODONE (ROXICODONE) 5 MG immediate release tablet   Oral   Take 1 tablet (5 mg total) by mouth every 8 (eight) hours as needed.   20 tablet   0   . predniSONE (DELTASONE) 10 MG tablet      Then take 30mg  po daily for 4 days, then take 20mg  po daily for 4 days, then take 10mg  po daily for 3 days   27 tablet   0   . simvastatin (ZOCOR) 40 MG tablet   Oral   Take 40 mg by mouth at bedtime.          . simvastatin (ZOCOR) 40 MG tablet      Take 1 tablet by mouth  daily         . SPIRIVA RESPIMAT 2.5 MCG/ACT AERS      Use 2 puffs daily   12 g   1   . spironolactone (ALDACTONE) 25 MG tablet   Oral   Take 25 mg by mouth daily.         . Tamsulosin HCl (FLOMAX) 0.4 MG CAPS  Oral   Take 0.4 mg by mouth daily.          Marland Kitchen torsemide (DEMADEX) 20 MG tablet   Oral   Take 40 mg by mouth 2 (two) times  daily.         Marland Kitchen warfarin (COUMADIN) 2.5 MG tablet      Take as directed by  [facility]   120 tablet   0     Allergies:  Acetaminophen-codeine; Altace; Codeine; Other; Sulfonamide derivatives; and Sulfa antibiotics  Family History: Family History  Problem Relation Age of Onset  . Coronary artery disease Mother   . Heart attack Mother     x10  . Heart disease Mother   . Stroke Father   . Heart disease Father   . Heart attack Brother     with bypass   . Heart attack Sister     with bypass   . Cancer Sister     bone  . Pancreatic cancer Brother     pancreatic  . Heart disease Maternal Grandmother   . Heart disease Maternal Grandfather   . Leukemia Sister   . Cancer Maternal Aunt     ? type    Social History: Social History  Substance Use Topics  . Smoking status: Former Smoker -- 4.00 packs/day for 23 years    Types: Cigarettes    Quit date: 12/23/1987  . Smokeless tobacco: Never Used  . Alcohol Use: No     Review of Systems:   10 point review of systems was performed and was otherwise negative:  Constitutional: Recent onset of low-grade fever Eyes: No visual disturbances ENT: No sore throat, ear pain Cardiac: No chest pain Respiratory: Patient had shortness of breath Abdomen: No abdominal pain, no vomiting, No diarrhea Endocrine: No weight loss, No night sweats Extremities: No peripheral edema, cyanosis Skin: No rashes, easy bruising Neurologic: No focal weakness, trouble with speech or swollowing Urologic: No dysuria, Hematuria, or urinary frequency   Physical Exam:  ED Triage Vitals  Enc Vitals Group     BP 01/31/16 1612 120/60 mmHg     Pulse Rate 01/31/16 1638 110     Resp 01/31/16 1612 34     Temp 01/31/16 1707 99.9 F (37.7 C)     Temp Source 01/31/16 1707 Oral     SpO2 01/31/16 1620 77 %     Weight 01/31/16 1707 146 lb (66.225 kg)     Height 01/31/16 1707 5\' 4"  (1.626 m)     Head Cir --      Peak Flow --      Pain Score  01/31/16 2029 0     Pain Loc --      Pain Edu? --      Excl. in [place]? --     General: Awake , Alert , and Oriented times 3; GCS 15 Head: Normal cephalic , atraumatic patient's somewhat pale and diaphoretic Eyes: Pupils equal , round, reactive to light Nose/Throat: No nasal drainage, patent upper airway without erythema or exudate.  Neck: Supple, Full range of motion, No anterior adenopathy or palpable thyroid masses Lungs: Limited auscultation with diminished breath sounds bilaterally at the bases with some mild rales and rhonchi at the left base Heart: Irregular rate, irregular rhythm with a mechanical murmurs ,  Abdomen: Soft, non tender without rebound, guarding , or rigidity; bowel sounds positive and symmetric in all 4 quadrants. No organomegaly .        Extremities: 2 plus symmetric  pulses. No edema, clubbing or cyanosis Neurologic: normal ambulation, Motor symmetric without deficits, sensory intact Skin: warm, dry, no rashes   Labs:   All laboratory work was reviewed including any pertinent negatives or positives listed below:  Labs Reviewed  CBC WITH DIFFERENTIAL/PLATELET - Abnormal; Notable for the following:    WBC 11.8 (*)    RBC 4.09 (*)    Hemoglobin 12.0 (*)    HCT 37.1 (*)    RDW 16.4 (*)    Neutro Abs 9.5 (*)    All other components within normal limits  COMPREHENSIVE METABOLIC PANEL - Abnormal; Notable for the following:    CO2 20 (*)    Glucose, Bld 163 (*)    BUN 35 (*)    Creatinine, Ser 1.44 (*)    GFR calc non Af Amer 46 (*)    GFR calc Af Amer 53 (*)    All other components within normal limits  TROPONIN I - Abnormal; Notable for the following:    Troponin I 0.10 (*)    All other components within normal limits  BRAIN NATRIURETIC PEPTIDE - Abnormal; Notable for the following:    B Natriuretic Peptide 1330.0 (*)    All other components within normal limits  PROTIME-INR - Abnormal; Notable for the following:    Prothrombin Time 29.1 (*)    All other  components within normal limits  DIGOXIN LEVEL - Abnormal; Notable for the following:    Digoxin Level 0.5 (*)    All other components within normal limits  TROPONIN I - Abnormal; Notable for the following:    Troponin I 0.14 (*)    All other components within normal limits  CULTURE, BLOOD (ROUTINE X 2)  CULTURE, BLOOD (ROUTINE X 2)  MAGNESIUM  LACTIC ACID, PLASMA  TROPONIN I  TROPONIN I  LACTIC ACID, PLASMA   review of the laboratory work was sutured troponin shows a block from the previous troponin of 0.10 point 0.14  EKG:  1 ED ECG REPORT I, Daymon Larsen, the attending physician, personally viewed and interpreted this ECG.  Date: 02/01/2016 EKG Time: *1613 Rate: 105 Rhythm: Atrial fibrillation with a rapid ventricular rate QRS Axis: normal Intervals: normal ST/T Wave abnormalities: normal Conduction Disturbances: none Narrative Interpretation: unremarkable Nonspecific ST-T wave abnormalities likely rate dependent Left axis deviation I no obvious acute ischemic changes  EKG #2 ED ECG REPORT I, Daymon Larsen, the attending physician, personally viewed and interpreted this ECG.  Date: 02/01/2016 EKG Time: *2145 Rate: 87 Rhythm: Atrial fibrillation QRS Axis: normal Intervals: Right bundle-branch block ST/T Wave abnormalities: normal Conduction Disturbances: none Narrative Interpretation: unremarkable No acute ischemic changes  EXAM: CT CHEST WITHOUT CONTRAST  TECHNIQUE: Multidetector CT imaging of the chest was performed following the standard protocol without IV contrast.  COMPARISON: Chest radiograph dated 01/31/2016 and CT dated 04/28/2014  FINDINGS: There is emphysematous changes of the lungs. The patchy area of airspace opacity with air bronchogram noted at the left lung base and to a lesser degree involving the lingula most compatible with pneumonia. Clinical correlation and follow-up to resolution recommended. Trace right pleural effusion.  Small scattered right lung base nodular and ground-glass densities for also concerning for pneumonia. There is no pneumothorax. The central airways are patent.  There is atherosclerotic calcification of the thoracic aorta. The central pulmonary arteries are grossly unremarkable on this noncontrast study. There is cardiomegaly. Mechanical mitral valve and CABG changes noted. There is hypoattenuation of the cardiac blood pool suggestive of a  degree of anemia. Clinical correlation is recommended. No pericardial effusion. There is no hilar or mediastinal adenopathy. The esophagus is grossly unremarkable. No thyroid nodule identified.  There is no axillary adenopathy. The chest wall soft tissues appear unremarkable there is degenerative changes of the spine. No acute fracture.  The visualized upper abdomen is grossly unremarkable.  IMPRESSION: Multifocal pneumonia most prominent involving the left lung base. Clinical correlation and follow-up recommended.   Electronically Signed CLINICAL DATA: Chest pain for an hour with shortness of Breath  EXAM: PORTABLE CHEST 1 VIEW  COMPARISON: 01/18/2016  FINDINGS: Cardiac shadow is enlarged. Mild vascular congestion is noted without significant interstitial edema. Mild left basilar atelectasis is noted. No acute bony abnormality is seen.  IMPRESSION: Cardiomegaly and mild left basilar atelectasis. Mild vascular congestion is noted as well.    *CLINICAL DATA: Chest pain for an hour with shortness of Breath  EXAM: PORTABLE CHEST 1 VIEW  COMPARISON: 01/18/2016  FINDINGS: Cardiac shadow is enlarged. Mild vascular congestion is noted without significant interstitial edema. Mild left basilar atelectasis is noted. No acute bony abnormality is seen.  IMPRESSION: Cardiomegaly and mild left basilar atelectasis. Mild vascular congestion is noted as well.   Critical Care: CRITICAL CARE Performed by: Daymon Larsen   Total  critical care time: 83 minutes  Critical care time was exclusive of separately billable procedures and treating other patients.  Critical care was necessary to treat or prevent imminent or life-threatening deterioration.  Critical care was time spent personally by me on the following activities: development of treatment plan with patient and/or surrogate as well as nursing, discussions with consultants, evaluation of patient's response to treatment, examination of patient, obtaining history from patient or surrogate, ordering and performing treatments and interventions, ordering and review of laboratory studies, ordering and review of radiographic studies, pulse oximetry and re-evaluation of patient's condition. Initial treatment of atrial fibrillation with community-acquired pneumonia along with occasional hemodynamic instability    ED Course:  Patient was placed in a bed and found to be in atrial fibrillation with rapid ventricular rate of 20 rate of 211. Complexes appeared now and I felt this was most likely atrial fibrillation with a bundle branch block pattern the complexes were unevenly distributed and the patient responded well to a bolus of diltiazem and then was started on a diltiazem drip. Patient historically is been somewhat hypotensive and That way based on his history congestive heart failure. The stridor related to been doing within normal limits recently. I felt that he most likely developed a fever with unstable atrial fibrillation which aggravated his history of congestive heart failure. Patient had good rate control with diltiazem which was eventually weaned down on the drip from 10 mg per hour to 5 mg/h to turned off. The patient still had some persistent hypotension and had been felt to have some community-acquired pneumonia which may be hiding being behind the large cardiac silhouette seen on his portable chest x-ray. Patient was started on IV Zosyn has blood cultures 2 pending and  was started later on vancomycin secondary to the findings on his chest exam and CAT scan evaluation. Due to the persistent hypotension the patient was started on a dobutamine drip which according to his family is responded well to in the past and his clinical condition was reviewed initially with the cardiology fellow at [facility]. The family requested to transfer since that's where his cardiologist relocated. Initial conversation the patient was on a diltiazem drip at that time and they were  read notified later during his stay that his troponin had increased and also he was having some persistent hypotension and was now placed on a dobutamine drip. His blood pressures gradually improved on the dobutamine which is now currently at 10 mics per minute with a tiny keep his systolic pressures above 90 with a heart rate below 120. Numerous conversations that occurred with the family along with the cardiology fellow is an entrance for service at [facility]. There was occasionally obstruction from the family with administration of medications and clinical orders ( please see nurse's note). Due to delays in being transferred to the [facility] conversations now have been esablished to keep the patient here at [facility] until  a bed is available in the [place] [facility] and EMTALA information has been filled out.   Assessment: Unstable atrial fibrillation with rapid ventricular rate Community-acquired pneumonia History of congestive heart failure     Plan:  Inpatient management            Daymon Larsen, MD 02/01/16 [phone]

## 2016-02-01 NOTE — Progress Notes (Signed)
[facility] arrived to transport patient to [facility] for continuum of care. Patient's belongings packed by his wife, hospital inquired Symbicort inhaler provided to patient. [facility] was given patient information packet, Telemetry monitor removed by [facility]. [facility] team placed patient on stretcher and transported off this unit. Well wishes were conveyed to patient and wife.

## 2016-02-01 NOTE — Care Management (Signed)
Informed that patient even when presented to ED has requested transfer to [facility] as his physicians are located at [facility].  2A  CM discussed the medicare guidelines of transfer coverage if not for medical necessity and patient's wife stated "We don't care."   Transport agency is [facility].  Informed patient and wife that at present time [facility] says it will be 2AM before they can transport and patient/wife states they will wait- the time of transfer does not matter..  Informed primary nurse.  Patient/wife do not want [facility] to transport no matter what.

## 2016-02-01 NOTE — H&P (Signed)
[facility] [place] NAME: [name]    MR#:  [id number]  DATE OF BIRTH:  January 06, 1941  DATE OF ADMISSION:  01/31/2016  PRIMARY CARE PHYSICIAN: Maryland Pink, MD   REQUESTING/REFERRING PHYSICIAN: Dr. Marcelene Butte  CHIEF COMPLAINT:   Chief Complaint  Patient presents with  . Shortness of Breath    HISTORY OF PRESENT ILLNESS:  [name]  is a 75 y.o. male with a known history of chronic systolic CHF with ejection fraction 35%, diabetes, hypertension, CAD status post CABG, chronic atrial fibrillation presents to the emergency room complaining of worsening shortness of breath and left-sided chest pain. Patient in the emergency room was found to have heart rate at 150 with atrial fibrillation. Initially started on a Cardizem drip. Blood pressure slowly trended down later started on dobutamine. CT scan of the chest shows bilateral pneumonia more in the left lower lobe. Patient and family have requested that he be transferred to [facility]. Patient has been accepted at [facility] by then no beds. Patient will likely be here at [facility] for greater than 12 hours and is being admitted to ICU for further care. And has had complicated history with significant CHF needing dobutamine support along with diuresis at [facility]. He was transferred to [facility] for LVAD. He was not thought to be a candidate for LVAD at [facility] and was further diuresed along with dobutamine and then discharged home. Patient has felt weak with shortness of breath since discharge from [facility]. He takes Lasix only when he gains weight.  PAST MEDICAL HISTORY:   Past Medical History  Diagnosis Date  . Chronic atrial fibrillation (HCC)     a. warfarin; b. CHADSVASc 6 (CHF, HTN, age x 2, DM, vascular disease) giving him an estimated annual stroke risk of 9.8%  . Hypertension   . Hyperkalemia   . Chronic systolic CHF (congestive heart failure)  ([place])     a. echo 10/2014: EF 30-35%, AK of inf & post myocardium, HK of apical & lat wall, ant & antsept wall best preserved, mitral: mechanical prosthesis was present & well seated, LA moderately dilated, RV systolic fxn mildly reduced, PASP nl  . Coronary artery disease     Cath 2005- patent LIMA-LAD, SVG-OM grafts, moderate MR  . Prostate cancer ([place]) 04/2009  . Hyperlipidemia   . COPD (chronic obstructive pulmonary disease) ([place])     URI 9/12  . DM type 2 (diabetes mellitus, type 2) ([place])   . PNA (pneumonia) 03/2010  . Glaucoma   . Hard of hearing   . Myocardial infarction ([place])   . Gout   . Adenomatous polyps   . Diverticulosis     left side   . H/O mitral valve prolapse   . Sigmoid diverticulitis     perforated    PAST SURGICAL HISTORY:   Past Surgical History  Procedure Laterality Date  . Coronary artery bypass graft  1989    LIMA-LAD, SVG-OM  . Mitral valve replacement  2007    Mechanical, performed at [facility]  . Hernia repair    . External ear surgery      drum  . Colon resection  01/12/2012    sigmoid colectomy and colostomy for diverticulitis  . Cataract extraction    . Colonoscopy  11/23/2008    SOCIAL HISTORY:   Social History  Substance Use Topics  . Smoking status: Former Smoker -- 4.00 packs/day for 23  years    Types: Cigarettes    Quit date: 12/23/1987  . Smokeless tobacco: Never Used  . Alcohol Use: No    FAMILY HISTORY:   Family History  Problem Relation Age of Onset  . Coronary artery disease Mother   . Heart attack Mother     x10  . Heart disease Mother   . Stroke Father   . Heart disease Father   . Heart attack Brother     with bypass   . Heart attack Sister     with bypass   . Cancer Sister     bone  . Pancreatic cancer Brother     pancreatic  . Heart disease Maternal Grandmother   . Heart disease Maternal Grandfather   . Leukemia Sister   . Cancer Maternal Aunt     ? type    DRUG ALLERGIES:   Allergies  Allergen  Reactions  . Acetaminophen-Codeine Hives  . Altace [Ramipril] Other (See Comments)    Other reaction(s): Weal  . Codeine Hives  . Other     Other reaction(s): Fever  . Sulfonamide Derivatives Hives  . Sulfa Antibiotics Rash    Other reaction(s): Weal    REVIEW OF SYSTEMS:   Review of Systems  Constitutional: Positive for fever and malaise/fatigue. Negative for chills and weight loss.  HENT: Negative for hearing loss and nosebleeds.   Eyes: Negative for blurred vision, double vision and pain.  Respiratory: Positive for cough and shortness of breath. Negative for hemoptysis, sputum production and wheezing.   Cardiovascular: Positive for chest pain. Negative for palpitations, orthopnea and leg swelling.  Gastrointestinal: Negative for nausea, vomiting, abdominal pain, diarrhea and constipation.  Genitourinary: Negative for dysuria and hematuria.  Musculoskeletal: Negative for myalgias, back pain and falls.  Skin: Negative for rash.  Neurological: Positive for weakness. Negative for dizziness, tremors, sensory change, speech change, focal weakness, seizures and headaches.  Endo/Heme/Allergies: Does not bruise/bleed easily.  Psychiatric/Behavioral: Negative for depression and memory loss. The patient is not nervous/anxious.     MEDICATIONS AT HOME:   Prior to Admission medications   Medication Sig Start Date End Date Taking? Authorizing Provider  warfarin (COUMADIN) 2.5 MG tablet Take 2.5-5 mg by mouth daily. Take 2 tablets (5mg ) on Monday, Wednesday and Saturday Take 1 tablet (2.5mg ) on Tuesday, Thursday, Friday and Sunday   Yes Historical Provider, MD  aspirin EC 81 MG tablet Take 1 tablet (81 mg total) by mouth daily. 11/24/15   Dayna N Dunn, PA-C  beta carotene w/minerals (OCUVITE) tablet Take 1 tablet by mouth daily.    Historical Provider, MD  budesonide-formoterol (SYMBICORT) 160-4.5 MCG/ACT inhaler Inhale 2 puffs into the lungs 2 (two) times daily.    Historical Provider, MD   Cholecalciferol (VITAMIN D) 2000 UNITS tablet Take 2,000 Units by mouth daily.    Historical Provider, MD  digoxin (DIGOX) 0.125 MG tablet Take 1 tablet by mouth  daily 11/19/15   Minna Merritts, MD  HYDROcodone-acetaminophen (NORCO/VICODIN) 5-325 MG tablet Take 1-2 tabs every 4-6 hrs PRN pain 01/07/16   Historical Provider, MD  latanoprost (XALATAN) 0.005 % ophthalmic solution Place 1 drop into both eyes at bedtime.  02/14/15   Historical Provider, MD  levofloxacin (LEVAQUIN) 750 MG tablet Take 1 tablet (750 mg total) by mouth daily. Patient not taking: Reported on 02/01/2016 01/18/16   Juanito Doom, MD  losartan (COZAAR) 100 MG tablet Take 100 mg by mouth daily.    Historical Provider, MD  metFORMIN (GLUCOPHAGE)  500 MG tablet Take 1,000 mg by mouth 2 (two) times daily with a meal.    Historical Provider, MD  metoprolol succinate (TOPROL-XL) 50 MG 24 hr tablet Take 50 mg by mouth daily. Take with or immediately following a meal.    Historical Provider, MD  oxyCODONE (ROXICODONE) 5 MG immediate release tablet Take 1 tablet (5 mg total) by mouth every 8 (eight) hours as needed. 12/17/15 12/16/16  Earleen Newport, MD  predniSONE (DELTASONE) 10 MG tablet Then take 30mg  po daily for 4 days, then take 20mg  po daily for 4 days, then take 10mg  po daily for 3 days Patient not taking: Reported on 02/01/2016 01/18/16   Juanito Doom, MD  simvastatin (ZOCOR) 40 MG tablet Take 40 mg by mouth at bedtime.     Historical Provider, MD  SPIRIVA RESPIMAT 2.5 MCG/ACT AERS Use 2 puffs daily 10/24/15   Juanito Doom, MD  spironolactone (ALDACTONE) 25 MG tablet Take 25 mg by mouth daily.    Historical Provider, MD  Tamsulosin HCl (FLOMAX) 0.4 MG CAPS Take 0.4 mg by mouth daily.  07/30/11   Historical Provider, MD  torsemide (DEMADEX) 20 MG tablet Take 40 mg by mouth 2 (two) times daily.    Historical Provider, MD  warfarin (COUMADIN) 2.5 MG tablet Take as directed by  [facility] Patient not taking:  Reported on 02/01/2016 01/10/16   Minna Merritts, MD      VITAL SIGNS:  Blood pressure 103/59, pulse 94, temperature 100.5 F (38.1 C), temperature source Oral, resp. rate 21, height 5\' 4"  (1.626 m), weight 66.225 kg (146 lb), SpO2 97 %.  PHYSICAL EXAMINATION:  Physical Exam  GENERAL:  75 y.o.-year-old patient lying in the bed with mild respiratory distress. Anxious EYES: Pupils equal, round, reactive to light and accommodation. No scleral icterus. Extraocular muscles intact.  HEENT: Head atraumatic, normocephalic. Oropharynx and nasopharynx clear. No oropharyngeal erythema, moist oral mucosa  NECK:  Supple, no jugular venous distention. No thyroid enlargement, no tenderness.  LUNGS: Bilateral coarse breath sounds with wheezing CARDIOVASCULAR: S1, S2 , irregular ABDOMEN: Soft, nontender, nondistended. Bowel sounds present. No organomegaly or mass.  EXTREMITIES: No pedal edema, cyanosis, or clubbing. + 2 pedal & radial pulses b/l.   NEUROLOGIC: Cranial nerves II through XII are intact. No focal Motor or sensory deficits appreciated b/l PSYCHIATRIC: The patient is alert and oriented x 3. Good affect.  SKIN: No obvious rash, lesion, or ulcer.   LABORATORY PANEL:   CBC  Recent Labs Lab 01/31/16 1627  WBC 11.8*  HGB 12.0*  HCT 37.1*  PLT 158   ------------------------------------------------------------------------------------------------------------------  Chemistries   Recent Labs Lab 01/31/16 1625 01/31/16 1627  NA  --  141  K  --  4.2  CL  --  109  CO2  --  20*  GLUCOSE  --  163*  BUN  --  35*  CREATININE  --  1.44*  CALCIUM  --  9.0  MG 2.2  --   AST  --  30  ALT  --  44  ALKPHOS  --  58  BILITOT  --  0.6   ------------------------------------------------------------------------------------------------------------------  Cardiac Enzymes  Recent Labs Lab 02/01/16 0126  TROPONINI 0.14*    ------------------------------------------------------------------------------------------------------------------  RADIOLOGY:  Ct Chest Wo Contrast  02/01/2016  CLINICAL DATA:  75 year old male with leukocytosis and tachycardia. Concern for pneumonia. EXAM: CT CHEST WITHOUT CONTRAST TECHNIQUE: Multidetector CT imaging of the chest was performed following the standard protocol without IV contrast.  COMPARISON:  Chest radiograph dated 01/31/2016 and CT dated 04/28/2014 FINDINGS: There is emphysematous changes of the lungs. The patchy area of airspace opacity with air bronchogram noted at the left lung base and to a lesser degree involving the lingula most compatible with pneumonia. Clinical correlation and follow-up to resolution recommended. Trace right pleural effusion. Small scattered right lung base nodular and ground-glass densities for also concerning for pneumonia. There is no pneumothorax. The central airways are patent. There is atherosclerotic calcification of the thoracic aorta. The central pulmonary arteries are grossly unremarkable on this noncontrast study. There is cardiomegaly. Mechanical mitral valve and CABG changes noted. There is hypoattenuation of the cardiac blood pool suggestive of a degree of anemia. Clinical correlation is recommended. No pericardial effusion. There is no hilar or mediastinal adenopathy. The esophagus is grossly unremarkable. No thyroid nodule identified. There is no axillary adenopathy. The chest wall soft tissues appear unremarkable there is degenerative changes of the spine. No acute fracture. The visualized upper abdomen is grossly unremarkable. IMPRESSION: Multifocal pneumonia most prominent involving the left lung base. Clinical correlation and follow-up recommended. Electronically Signed   By: Anner Crete M.D.   On: 02/01/2016 00:25   Dg Chest Port 1 View  01/31/2016  CLINICAL DATA:  Chest pain for an hour with shortness of Breath EXAM: PORTABLE CHEST 1  VIEW COMPARISON:  01/18/2016 FINDINGS: Cardiac shadow is enlarged. Mild vascular congestion is noted without significant interstitial edema. Mild left basilar atelectasis is noted. No acute bony abnormality is seen. IMPRESSION: Cardiomegaly and mild left basilar atelectasis. Mild vascular congestion is noted as well. Electronically Signed   By: Inez Catalina M.D.   On: 01/31/2016 16:23     IMPRESSION AND PLAN:   * Septic shock with bilateral healthcare acquired pneumonia Start IV vancomycin and Zosyn. Dobutamine. Cultures  * Atrial fibrillation with rapid ventricular rate Due to sepsis and pneumonia. Improved and now patient is off Cardizem.  * Mild elevation in troponin due to demand ischemia  * COPD Patient does have wheezing along with his pneumonia and will be started on steroids. Nebulizer therapy as needed.  * Chronic systolic CHF No pedal edema or pulmonary edema on CAT scan. No need for Lasix at this point.  * DVT prophylaxis. Patient is on Coumadin.  Patient will be admitted to ICU here at [facility]. He is waiting for a bed at [place]. He will be transferred once bed available.  Patient is critically ill with high risk for cardiac arrest and death.   All the records are reviewed and case discussed with ED provider. Management plans discussed with the patient, family and they are in agreement.  CODE STATUS: FULL  TOTAL CC TIME TAKING CARE OF THIS PATIENT: 40 minutes.    Hillary Bow R M.D on 02/01/2016 at 2:56 AM  Between 7am to 6pm - Pager - [phone]  After 6pm go to [email or web address] - password [email or web address] [place] [facility]  Office  [phone]  CC: Primary care physician; Maryland Pink, MD     Note: This dictation was prepared with Dragon dictation along with smaller phrase technology. Any transcriptional errors that result from this process are unintentional.

## 2016-02-01 NOTE — Progress Notes (Signed)
Care link transport called nursing unit to receive patient report to assume patient care for transport to [facility].

## 2016-02-01 NOTE — Progress Notes (Signed)
Spoke with Dr. Bridgett Larsson about patient's daughter concern about not receiving torsemide. Dr. Bridgett Larsson stated that he wanted Cardiology to further evaluate. Dr. Fletcher Anon notified of consult, stated that he would further assess. [name]

## 2016-02-01 NOTE — ED Notes (Signed)
ED MD on phone discussing pt. Report will be called after MD has finished discussing plan of care of pt.

## 2016-02-01 NOTE — Discharge Summary (Addendum)
[facility] NAME: [name]    MR#:  [id number]  DATE OF BIRTH:  30-Oct-1941  DATE OF ADMISSION:  01/31/2016 ADMITTING PHYSICIAN: Hillary Bow, MD  DATE OF DISCHARGE: 02/01/2016 PRIMARY CARE PHYSICIAN: Maryland Pink, MD    ADMISSION DIAGNOSIS:  Community acquired pneumonia [J18.9]   DISCHARGE DIAGNOSIS:  Septic shock with bilateral healthcare acquired pneumonia Atrial fibrillation with rapid ventricular rate ARF  Mild elevation in troponin due to demand ischemia SECONDARY DIAGNOSIS:   Past Medical History  Diagnosis Date  . Chronic atrial fibrillation (HCC)     a. warfarin; b. CHADSVASc 6 (CHF, HTN, age x 2, DM, vascular disease) giving him an estimated annual stroke risk of 9.8%  . Hypertension   . Hyperkalemia   . Chronic systolic CHF (congestive heart failure) ([place])     a. echo 10/2014: EF 30-35%, AK of inf & post myocardium, HK of apical & lat wall, ant & antsept wall best preserved, mitral: mechanical prosthesis was present & well seated, LA moderately dilated, RV systolic fxn mildly reduced, PASP nl  . Coronary artery disease     Cath 2005- patent LIMA-LAD, SVG-OM grafts, moderate MR  . Prostate cancer ([place]) 04/2009  . Hyperlipidemia   . COPD (chronic obstructive pulmonary disease) ([place])     URI 9/12  . DM type 2 (diabetes mellitus, type 2) ([place])   . PNA (pneumonia) 03/2010  . Glaucoma   . Hard of hearing   . Myocardial infarction ([place])   . Gout   . Adenomatous polyps   . Diverticulosis     left side   . H/O mitral valve prolapse   . Sigmoid diverticulitis     perforated    HOSPITAL COURSE:  [name] is a 75 y.o. male with a known history of chronic systolic CHF with ejection fraction 35%, diabetes, hypertension, CAD status post CABG, chronic atrial fibrillation presents to the emergency room complaining of worsening shortness of breath and left-sided chest pain. Patient in the emergency room was  found to have heart rate at 150 with atrial fibrillation. Initially started on a Cardizem drip. Blood pressure slowly trended down later started on dobutamine. CT scan of the chest shows bilateral pneumonia more in the left lower lobe. Patient and family have requested that he be transferred to [facility]. Patient has been accepted at [facility] by then no beds. Patient will likely be here at [facility] for greater than 12 hours and is being admitted to ICU for further care. And has had complicated history with significant CHF needing dobutamine support along with diuresis at [facility]. He was transferred to [facility] for LVAD. He was not thought to be a candidate for LVAD at [facility] and was further diuresed along with dobutamine and then discharged home. Patient has felt weak with shortness of breath since discharge from [place]. He takes Lasix only when he gains weight.  * Septic shock with bilateral healthcare acquired pneumonia He has been treated with IV vancomycin and Zosyn. He was on Dobutamine but since blood pressure is better, he os off dobutamine this morning.  * Atrial fibrillation with rapid ventricular rate Due to sepsis and pneumonia. Improved and off Cardizem.  * Mild elevation in troponin due to demand ischemia  * ARF. Possible due to hypotension. Hold diuretics at this time and follow-up BMP.  * COPD Patient does have wheezing along with his pneumonia and was  started on steroids. Nebulizer therapy as needed.  * Chronic systolic CHF No pedal edema or pulmonary edema on CAT scan. No need for Lasix at this point.  * DVT prophylaxis. Patient is on Coumadin.  Patient was admitted to ICU at [facility]. He is waiting for a bed at [place]. He will be transferred once bed available.  DISCHARGE CONDITIONS:   Guarded, the patient will be transferred to [facility] today.  CONSULTS OBTAINED:  Treatment Team:  Wilhelmina Mcardle, MD Wellington Hampshire, MD  DRUG ALLERGIES:   Allergies  Allergen Reactions  . Acetaminophen-Codeine Hives  . Altace [Ramipril] Other (See Comments)    Other reaction(s): Weal  . Codeine Hives  . Other     Other reaction(s): Fever  . Sulfonamide Derivatives Hives  . Sulfa Antibiotics Rash    Other reaction(s): Weal    DISCHARGE MEDICATIONS:   Current Discharge Medication List    CONTINUE these medications which have NOT CHANGED   Details  !! warfarin (COUMADIN) 2.5 MG tablet Take 2.5-5 mg by mouth daily. Take 2 tablets (5mg ) on Monday, Wednesday and Saturday Take 1 tablet (2.5mg ) on Tuesday, Thursday, Friday and Sunday    aspirin EC 81 MG tablet Take 1 tablet (81 mg total) by mouth daily.    beta carotene w/minerals (OCUVITE) tablet Take 1 tablet by mouth daily.    budesonide-formoterol (SYMBICORT) 160-4.5 MCG/ACT inhaler Inhale 2 puffs into the lungs 2 (two) times daily.    Cholecalciferol (VITAMIN D) 2000 UNITS tablet Take 2,000 Units by mouth daily.    digoxin (DIGOX) 0.125 MG tablet Take 1 tablet by mouth  daily Qty: 90 tablet, Refills: 3    HYDROcodone-acetaminophen (NORCO/VICODIN) 5-325 MG tablet Take 1-2 tabs every 4-6 hrs PRN pain    latanoprost (XALATAN) 0.005 % ophthalmic solution Place 1 drop into both eyes at bedtime.     losartan (COZAAR) 100 MG tablet Take 100 mg by mouth daily.    metFORMIN (GLUCOPHAGE) 500 MG tablet Take 1,000 mg by mouth 2 (two) times daily with a meal.    metoprolol succinate (TOPROL-XL) 50 MG 24 hr tablet Take 50 mg by mouth daily. Take with or immediately following a meal.    oxyCODONE (ROXICODONE) 5 MG immediate release tablet Take 1 tablet (5 mg total) by mouth every 8 (eight) hours as needed. Qty: 20 tablet, Refills: 0    predniSONE (DELTASONE) 10 MG tablet Then take 30mg  po daily for 4 days, then take 20mg  po daily for 4 days, then take 10mg  po daily for 3 days Qty: 27 tablet, Refills: 0    simvastatin (ZOCOR) 40 MG  tablet Take 40 mg by mouth at bedtime.     SPIRIVA RESPIMAT 2.5 MCG/ACT AERS Use 2 puffs daily Qty: 12 g, Refills: 1    spironolactone (ALDACTONE) 25 MG tablet Take 25 mg by mouth daily.    Tamsulosin HCl (FLOMAX) 0.4 MG CAPS Take 0.4 mg by mouth daily.     torsemide (DEMADEX) 20 MG tablet Take 40 mg by mouth 2 (two) times daily.    !! warfarin (COUMADIN) 2.5 MG tablet Take as directed by  C[facility] Clinic Qty: 120 tablet, Refills: 0     !! - Potential duplicate medications found. Please discuss with provider.    STOP taking these medications     levofloxacin (LEVAQUIN) 750 MG tablet          DISCHARGE INSTRUCTIONS:    If you experience worsening of your  admission symptoms, develop shortness of breath, life threatening emergency, suicidal or homicidal thoughts you must seek medical attention immediately by calling 911 or calling your MD immediately  if symptoms less severe.  You Must read complete instructions/literature along with all the possible adverse reactions/side effects for all the Medicines you take and that have been prescribed to you. Take any new Medicines after you have completely understood and accept all the possible adverse reactions/side effects.   Please note  You were cared for by a hospitalist during your hospital stay. If you have any questions about your discharge medications or the care you received while you were in the hospital after you are discharged, you can call the unit and asked to speak with the hospitalist on call if the hospitalist that took care of you is not available. Once you are discharged, your primary care physician will handle any further medical issues. Please note that NO REFILLS for any discharge medications will be authorized once you are discharged, as it is imperative that you return to your primary care physician (or establish a relationship with a primary care physician if you do not have one) for your aftercare needs so that they can  reassess your need for medications and monitor your lab values.    Today   SUBJECTIVE   Shortness of breath is better.   VITAL SIGNS:  Blood pressure 124/80, pulse 100, temperature 97.4 F (36.3 C), temperature source Oral, resp. rate 20, height 5\' 6"  (1.676 m), weight 68.72 kg (151 lb 8 oz), SpO2 100 %.  I/O:   Intake/Output Summary (Last 24 hours) at 02/01/16 1600 Last data filed at 02/01/16 1440  Gross per 24 hour  Intake    240 ml  Output    360 ml  Net   -120 ml    PHYSICAL EXAMINATION:  GENERAL:  75 y.o.-year-old patient lying in the bed with no acute distress.  EYES: Pupils equal, round, reactive to light and accommodation. No scleral icterus. Extraocular muscles intact.  HEENT: Head atraumatic, normocephalic. Oropharynx and nasopharynx clear.  NECK:  Supple, no jugular venous distention. No thyroid enlargement, no tenderness.  LUNGS: Normal breath sounds bilaterally, no wheezing, rales,rhonchi or crepitation. No use of accessory muscles of respiration.  CARDIOVASCULAR: S1, S2 normal. No murmurs, rubs, or gallops.  ABDOMEN: Soft, non-tender, non-distended. Bowel sounds present. No organomegaly or mass.  EXTREMITIES: No pedal edema, cyanosis, or clubbing.  NEUROLOGIC: Cranial nerves II through XII are intact. Muscle strength 4/5 in all extremities. Sensation intact. Gait not checked.  PSYCHIATRIC: The patient is alert and oriented x 3.  SKIN: No obvious rash, lesion, or ulcer.   DATA REVIEW:   CBC  Recent Labs Lab 02/01/16 0809  WBC 10.5  HGB 10.0*  HCT 30.5*  PLT 118*    Chemistries   Recent Labs Lab 01/31/16 1625  01/31/16 1627 02/01/16 0809  NA  --   < > 141 137  K  --   < > 4.2 4.7  CL  --   < > 109 109  CO2  --   < > 20* 17*  GLUCOSE  --   < > 163* 242*  BUN  --   < > 35* 43*  CREATININE  --   < > 1.44* 1.73*  CALCIUM  --   < > 9.0 8.4*  MG 2.2  --   --   --   AST  --   --  30  --   ALT  --   --  44  --   ALKPHOS  --   --  58  --    BILITOT  --   --  0.6  --   < > = values in this interval not displayed.  Cardiac Enzymes  Recent Labs Lab 02/01/16 0809  TROPONINI 0.09*    Microbiology Results  Results for orders placed or performed during the hospital encounter of 01/31/16  Culture, blood (Routine X 2) w Reflex to ID Panel     Status: None (Preliminary result)   Collection Time: 01/31/16  4:25 PM  Result Value Ref Range Status   Specimen Description BLOOD LEFT  Final   Special Requests   Final    BOTTLES DRAWN AEROBIC AND ANAEROBIC 8CC AERO 6CC ANA   Culture NO GROWTH < 24 HOURS  Final   Report Status PENDING  Incomplete  Culture, blood (Routine X 2) w Reflex to ID Panel     Status: None (Preliminary result)   Collection Time: 01/31/16  4:25 PM  Result Value Ref Range Status   Specimen Description BLOOD RIGHT ASSIST CONTROL  Final   Special Requests BOTTLES DRAWN AEROBIC AND ANAEROBIC [place]  Final   Culture NO GROWTH < 24 HOURS  Final   Report Status PENDING  Incomplete  MRSA PCR Screening     Status: None   Collection Time: 02/01/16  4:27 AM  Result Value Ref Range Status   MRSA by PCR NEGATIVE NEGATIVE Final    Comment:        The GeneXpert MRSA Assay (FDA approved for NASAL specimens only), is one component of a comprehensive MRSA colonization surveillance program. It is not intended to diagnose MRSA infection nor to guide or monitor treatment for MRSA infections.     RADIOLOGY:  Ct Chest Wo Contrast  02/01/2016  CLINICAL DATA:  75 year old male with leukocytosis and tachycardia. Concern for pneumonia. EXAM: CT CHEST WITHOUT CONTRAST TECHNIQUE: Multidetector CT imaging of the chest was performed following the standard protocol without IV contrast. COMPARISON:  Chest radiograph dated 01/31/2016 and CT dated 04/28/2014 FINDINGS: There is emphysematous changes of the lungs. The patchy area of airspace opacity with air bronchogram noted at the left lung base and to a lesser degree involving the  lingula most compatible with pneumonia. Clinical correlation and follow-up to resolution recommended. Trace right pleural effusion. Small scattered right lung base nodular and ground-glass densities for also concerning for pneumonia. There is no pneumothorax. The central airways are patent. There is atherosclerotic calcification of the thoracic aorta. The central pulmonary arteries are grossly unremarkable on this noncontrast study. There is cardiomegaly. Mechanical mitral valve and CABG changes noted. There is hypoattenuation of the cardiac blood pool suggestive of a degree of anemia. Clinical correlation is recommended. No pericardial effusion. There is no hilar or mediastinal adenopathy. The esophagus is grossly unremarkable. No thyroid nodule identified. There is no axillary adenopathy. The chest wall soft tissues appear unremarkable there is degenerative changes of the spine. No acute fracture. The visualized upper abdomen is grossly unremarkable. IMPRESSION: Multifocal pneumonia most prominent involving the left lung base. Clinical correlation and follow-up recommended. Electronically Signed   By: Anner Crete M.D.   On: 02/01/2016 00:25   Dg Chest Port 1 View  01/31/2016  CLINICAL DATA:  Chest pain for an hour with shortness of Breath EXAM: PORTABLE CHEST 1 VIEW COMPARISON:  01/18/2016 FINDINGS: Cardiac shadow is enlarged. Mild vascular congestion is noted without significant interstitial edema. Mild left basilar atelectasis is noted. No acute  bony abnormality is seen. IMPRESSION: Cardiomegaly and mild left basilar atelectasis. Mild vascular congestion is noted as well. Electronically Signed   By: Inez Catalina M.D.   On: 01/31/2016 16:23        Management plans discussed with the patient, family and they are in agreement.  CODE STATUS:     Code Status Orders        Start     Ordered   02/01/16 0223  Full code   Continuous     02/01/16 0224    Code Status History    Date Active Date  Inactive Code Status Order ID Comments User Context   11/20/2015  1:25 PM 11/25/2015  3:33 AM Full Code [id number]  Shirley Friar, PA-C Inpatient   01/12/2012  9:29 PM 01/27/2012  4:38 PM Full Code [id number]  Doree Fudge, MD Inpatient   01/12/2012  9:07 PM 01/12/2012  9:15 PM Full Code [id number]  Janora Norlander, RN Inpatient   01/12/2012  8:56 PM 01/12/2012  9:07 PM Full Code [id number]  Doree Fudge, MD Inpatient   01/11/2012 11:19 PM 01/12/2012  8:51 PM Full Code [id number]  Theressa Millard, MD Inpatient      TOTAL TIME TAKING CARE OF THIS PATIENT: 43 minutes.    Demetrios Loll M.D on 02/01/2016 at 4:00 PM  Between 7am to 6pm - Pager - [phone]  After 6pm go to [email or web address] - password [email or web address] [facility]  [facility]  Office  [phone]  CC: Primary care physician; Maryland Pink, MD

## 2016-02-01 NOTE — Consult Note (Signed)
Cardiology Consult    Patient ID: [name] MRN: [id number], DOB/AGE: 12-23-1940   Admit date: 01/31/2016 Date of Consult: 02/01/2016  Primary Physician: Maryland Pink, MD Reason for Consult: Atrial fibrillation with RVR; Elevated Troponin Primary Cardiologist: Dr. Katy Apo ([facility]) Requesting Provider: Dr. Darvin Neighbours   History of Present Illness    TAVIS [name] is a 75 y.o. male with past medical history of chronic atrial fibrillation (on Coumadin), chronic systolic CHF (EF 123XX123 by echo in 10/2015), CAD (s/p CABG in 1989 with LIMA-LAD, SVG-OM), mitral valve replacement in 2007, HTN, HLD, COPD (on home O2) and Type 2 DM who presented to [facility] on 01/31/2016 for evaluation of chest pain and shortness of breath.   In the ER, his initial telemetry showed atrial fibrillation with RVR and HR in the 150's. He was started on a Cardizem drip which his HR responded appropriately to but he developed hypotension and Dobutamine was started to support his BP. While in the ED, his WBC was 11.8. Hgb 12.0. Platelets at 158.  BNP was 1330. Initial troponin value was [id number] and cyclic values have been obtained which are stable at 0.14. Chest CT was obtained which showed multifocal pneumonia most prominent involving the left lung base. He was started on IV Vancomycin and Zosyn. The family has requested transfer to [facility], but they have no beds currently available.   The patient was recently hospitalized at [facility] from 11/20/2015 to 11/24/2015 for acute on chronic systolic CHF. He was evaluated by CT Surgery that admission for possible LVAD procedure. He was transferred to [facility] for further evaluation and hospitalized from 11/25/2015 to 12/09/2015. He underwent two RHC which show preserved cardiac output after being weaned off Dobutamine, therefore he was not deemed to be a current LVAD candidate. The patient is now closely followed by Dr. Katy Apo at [facility] and wishes for all of his Cardiology follow-up to occur through him.     In talking with the patient today, he denies any current chest pain, dyspnea, or palpitations. He reports his chest pain resolved upon being started on IV Cardizem in the ED and his HR decreasing. No recurrence of chest pain or palpitations since. He is currently on 2L [place] and says that is his chronic oxygen requirement at home. At this time, his IV Cardizem and IV Dobutamine have been discontinued. He has been restarted on his PO Digoxin 0.125mg  daily and his HR is currently in the 90's. BP has improved to 124/80 on his most recent vitals.  Past Medical History   Past Medical History  Diagnosis Date  . Chronic atrial fibrillation (HCC)     a. warfarin; b. CHADSVASc 6 (CHF, HTN, age x 2, DM, vascular disease) giving him an estimated annual stroke risk of 9.8%  . Hypertension   . Hyperkalemia   . Chronic systolic CHF (congestive heart failure) ([place])     a. echo 10/2014: EF 30-35%, AK of inf & post myocardium, HK of apical & lat wall, ant & antsept wall best preserved, mitral: mechanical prosthesis was present & well seated, LA moderately dilated, RV systolic fxn mildly reduced, PASP nl  . Coronary artery disease     Cath 2005- patent LIMA-LAD, SVG-OM grafts, moderate MR  . Prostate cancer ([place]) 04/2009  . Hyperlipidemia   . COPD (chronic obstructive pulmonary disease) ([place])     URI 9/12  . DM type 2 (diabetes mellitus, type 2) ([place])   . PNA (pneumonia) 03/2010  . Glaucoma   .  Hard of hearing   . Myocardial infarction ([place])   . Gout   . Adenomatous polyps   . Diverticulosis     left side   . H/O mitral valve prolapse   . Sigmoid diverticulitis     perforated    Past Surgical History  Procedure Laterality Date  . Coronary artery bypass graft  1989    LIMA-LAD, SVG-OM  . Mitral valve replacement  2007    Mechanical, performed at [facility]  . Hernia repair    . External ear surgery      drum  . Colon resection  01/12/2012    sigmoid colectomy and colostomy for diverticulitis  .  Cataract extraction    . Colonoscopy  11/23/2008     Allergies  Allergies  Allergen Reactions  . Acetaminophen-Codeine Hives  . Altace [Ramipril] Other (See Comments)    Other reaction(s): Weal  . Codeine Hives  . Other     Other reaction(s): Fever  . Sulfonamide Derivatives Hives  . Sulfa Antibiotics Rash    Other reaction(s): Weal    Inpatient Medications    . aspirin EC  81 mg Oral Daily  . budesonide-formoterol  2 puff Inhalation BID  . cefTRIAXone (ROCEPHIN)  IV  1 g Intravenous Q24H  . digoxin  0.125 mg Oral Daily  . doxycycline  100 mg Oral Q12H  . insulin aspart  0-15 Units Subcutaneous TID WC  . insulin aspart  0-5 Units Subcutaneous QHS  . latanoprost  1 drop Both Eyes QHS  . simvastatin  40 mg Oral QHS  . sodium chloride flush  3 mL Intravenous Q12H  . sodium chloride flush  3 mL Intravenous Q12H  . tamsulosin  0.4 mg Oral Daily    Family History    Family History  Problem Relation Age of Onset  . Coronary artery disease Mother   . Heart attack Mother     x10  . Heart disease Mother   . Stroke Father   . Heart disease Father   . Heart attack Brother     with bypass   . Heart attack Sister     with bypass   . Cancer Sister     bone  . Pancreatic cancer Brother     pancreatic  . Heart disease Maternal Grandmother   . Heart disease Maternal Grandfather   . Leukemia Sister   . Cancer Maternal Aunt     ? type    Social History    Social History   Social History  . Marital Status: Married    Spouse Name: N/A  . Number of Children: 3  . Years of Education: N/A   Occupational History  . retired Airline pilot    Social History Main Topics  . Smoking status: Former Smoker -- 4.00 packs/day for 23 years    Types: Cigarettes    Quit date: 12/23/1987  . Smokeless tobacco: Never Used  . Alcohol Use: No  . Drug Use: No  . Sexual Activity: Not on file   Other Topics Concern  . Not on file   Social History Narrative   Lives in [place],  [place] with wife.      Review of Systems    General:  No chills, fever, night sweats or weight changes.  Cardiovascular:  No dyspnea on exertion, edema, orthopnea, paroxysmal nocturnal dyspnea. Positive for chest pain and palpitations. Dermatological: No rash, lesions/masses Respiratory: Positive for cough and dyspnea. Urologic: No hematuria, dysuria Abdominal:   No  nausea, vomiting, diarrhea, bright red blood per rectum, melena, or hematemesis Neurologic:  No visual changes, wkns, changes in mental status. All other systems reviewed and are otherwise negative except as noted above.  Physical Exam    Blood pressure 98/70, pulse 45, temperature 97.4 F (36.3 C), temperature source Oral, resp. rate 20, height 5\' 6"  (1.676 m), weight 151 lb 8 oz (68.72 kg), SpO2 100 %.  General: Pleasant, elderly Caucasian male appearing in NAD. On 2L [place]. Psych: Normal affect. Neuro: Alert and oriented X 3. Moves all extremities spontaneously. HEENT: Normal  Neck: Supple without bruits or JVD. Lungs:  Resp regular and unlabored, coarse breath sounds throughout with rhonchi. No wheezing or rales appreciated. Heart: Irregularly irregular,  no s3, s4, 2/6 SEM at RUSB. Abdomen: Soft, non-tender, non-distended, BS + x 4.  Extremities: No clubbing, cyanosis or edema. DP/PT/Radials 2+ and equal bilaterally.  Labs    Troponin (Point of Care Test) No results for input(s): TROPIPOC in the last 72 hours.  Recent Labs  01/31/16 1627 01/31/16 1937 02/11/2016 0126 11-Feb-2016 0809  TROPONINI 0.10* 0.14* 0.14* 0.09*   Lab Results  Component Value Date   WBC 10.5 2016-02-11   HGB 10.0* Feb 11, 2016   HCT 30.5* 2016/02/11   MCV 88.4 02-11-16   PLT 118* February 11, 2016    Recent Labs Lab 01/31/16 1627 February 11, 2016 0809  NA 141 137  K 4.2 4.7  CL 109 109  CO2 20* 17*  BUN 35* 43*  CREATININE 1.44* 1.73*  CALCIUM 9.0 8.4*  PROT 7.0  --   BILITOT 0.6  --   ALKPHOS 58  --   ALT 44  --   AST 30  --   GLUCOSE  163* 242*   Lab Results  Component Value Date   CHOL 108 01/17/2012   HDL 30* 07/30/2010   LDLCALC 71 07/30/2010   TRIG 176* 01/17/2012   No results found for: [facility]   Radiology Studies    Ct Chest Wo Contrast: 11-Feb-2016  CLINICAL DATA:  75 year old male with leukocytosis and tachycardia. Concern for pneumonia. EXAM: CT CHEST WITHOUT CONTRAST TECHNIQUE: Multidetector CT imaging of the chest was performed following the standard protocol without IV contrast. COMPARISON:  Chest radiograph dated 01/31/2016 and CT dated 04/28/2014 FINDINGS: There is emphysematous changes of the lungs. The patchy area of airspace opacity with air bronchogram noted at the left lung base and to a lesser degree involving the lingula most compatible with pneumonia. Clinical correlation and follow-up to resolution recommended. Trace right pleural effusion. Small scattered right lung base nodular and ground-glass densities for also concerning for pneumonia. There is no pneumothorax. The central airways are patent. There is atherosclerotic calcification of the thoracic aorta. The central pulmonary arteries are grossly unremarkable on this noncontrast study. There is cardiomegaly. Mechanical mitral valve and CABG changes noted. There is hypoattenuation of the cardiac blood pool suggestive of a degree of anemia. Clinical correlation is recommended. No pericardial effusion. There is no hilar or mediastinal adenopathy. The esophagus is grossly unremarkable. No thyroid nodule identified. There is no axillary adenopathy. The chest wall soft tissues appear unremarkable there is degenerative changes of the spine. No acute fracture. The visualized upper abdomen is grossly unremarkable. IMPRESSION: Multifocal pneumonia most prominent involving the left lung base. Clinical correlation and follow-up recommended. Electronically Signed   By: Anner Crete M.D.   On: Feb 11, 2016 00:25   Dg Chest Port 1 View: 01/31/2016  CLINICAL DATA:  Chest  pain for an hour with shortness  of Breath EXAM: PORTABLE CHEST 1 VIEW COMPARISON:  01/18/2016 FINDINGS: Cardiac shadow is enlarged. Mild vascular congestion is noted without significant interstitial edema. Mild left basilar atelectasis is noted. No acute bony abnormality is seen. IMPRESSION: Cardiomegaly and mild left basilar atelectasis. Mild vascular congestion is noted as well. Electronically Signed   By: Inez Catalina M.D.   On: 01/31/2016 16:23    EKG & Cardiac Imaging    EKG: Atrial fibrillation, rate-controlled with HR of 87. RBBB. No acute changes when compared to his previous tracings.  Echocardiogram: 11/21/2015 Study Conclusions - Left ventricle: The cavity size was moderately dilated. Wall thickness was normal. Systolic function was severely reduced. The estimated ejection fraction was in the range of 25% to 30%. Akinesis of the lateral and inferior myocardium. - Aortic valve: There was mild stenosis. Valve area (VTI): 1.31 cm^2. Valve area (Vmax): 1.4 cm^2. Valve area (Vmean): 1.38 cm^2. - Mitral valve: Prior procedures included surgical repair. Valve area by continuity equation (using LVOT flow): 2.75 cm^2. - Left atrium: The atrium was moderately dilated. - Pulmonary arteries: Systolic pressure was moderately increased. PA peak pressure: 47 mm Hg (S).  Assessment & Plan     1. Atrial Fibrillation w/ RVR - history of chronic atrial fibrillation but presented in atrial fibrillation with RVR, HR in the 150's. Started on IV Cardizem with improvement in his HR. IV Cardizem has now been discontinued.  - was on Digoxin 0.125mg  daily and Toprol-XL 50mg  daily prior to admission. His Toprol-XL was initially held due to hypotension but this has resolved following the discontinuation of IV Cardizem. Would plan to restart his Toprol-XL tomorrow as long as SBP remains stable.  - This patients CHA2DS2-VASc Score and unadjusted Ischemic Stroke Rate (% per year) is equal to 9.7 %  stroke rate/year from a score of 6 (CHF, HTN, DM, Vascular, Age (2)). Continue Coumadin per pharmacy dosing.  2. Elevated Troponin/ History of CAD - s/p CABG in 1989 with LIMA-LAD, SVG-OM. Presented with shortness of breath and chest discomfort in the setting of atrial fibrillation w/ RVR and PNA. Denies any chest pain or shortness of breath currently. - cyclic troponin values have been 0.10, 0.14, and 0.14.  - likely to represent demand ischemia in the setting of atrial fibrillation with RVR and PNA. - continue ASA, statin, and BB (starting tomorrow).  3. Chronic Systolic CHF - EF 123XX123 by echo in 10/2015 - now being followed by Dr. Katy Apo at [facility] - does not appear volume overloaded on physical exam. - plan to restart his BB tomorrow. Would restart Losartan and Spironolactone as BP allows.  4. Bilateral Healthcare Acquired PNA - per admitting team  The patient is still awaiting transfer to [facility], which is suppose to occur later this evening. He does not wish to be seen by Dr. Rockey Situ, who will be covering for [facility] this weekend.   Signed, Erma Heritage, PA-C 02/01/2016, 3:07 PM Pager: [phone]

## 2016-02-01 NOTE — Progress Notes (Signed)
Patient preferred to go by [facility] because of extended ETA of [facility] life flight. Transport cancelled with life flight and set up with [facility]. ETA is 2100. [name]

## 2016-02-01 NOTE — Consult Note (Signed)
ANTICOAGULATION CONSULT NOTE - Initial Consult  Pharmacy Consult for warfarin Indication: atrial fibrillation  Allergies  Allergen Reactions  . Acetaminophen-Codeine Hives  . Altace [Ramipril] Other (See Comments)    Other reaction(s): Weal  . Codeine Hives  . Other     Other reaction(s): Fever  . Sulfonamide Derivatives Hives  . Sulfa Antibiotics Rash    Other reaction(s): Weal    Patient Measurements: Height: 5\' 6"  (167.6 cm) Weight: 151 lb 8 oz (68.72 kg) IBW/kg (Calculated) : 63.8 Heparin Dosing Weight:   Vital Signs: Temp: 97.4 F (36.3 C) (02/10 1144) Temp Source: Oral (02/10 1144) BP: 98/70 mmHg (02/10 1144) Pulse Rate: 45 (02/10 1144)  Labs:  Recent Labs  01/30/16 1002 01/31/16 1625  01/31/16 1627 01/31/16 1937 02/01/16 0126 02/01/16 0809  HGB  --   --   --  12.0*  --   --  10.0*  HCT  --   --   --  37.1*  --   --  30.5*  PLT  --   --   --  158  --   --  118*  LABPROT  --  29.1*  --   --   --   --   --   INR 3.2 2.80  --   --   --   --   --   CREATININE  --   --   --  1.44*  --   --  1.73*  TROPONINI  --   --   < > 0.10* 0.14* 0.14* 0.09*  < > = values in this interval not displayed.  Estimated Creatinine Clearance: 32.8 mL/min (by C-G formula based on Cr of 1.73).   Medical History: Past Medical History  Diagnosis Date  . Chronic atrial fibrillation (HCC)     a. warfarin; b. CHADSVASc 6 (CHF, HTN, age x 2, DM, vascular disease) giving him an estimated annual stroke risk of 9.8%  . Hypertension   . Hyperkalemia   . Chronic systolic CHF (congestive heart failure) ([place])     a. echo 10/2014: EF 30-35%, AK of inf & post myocardium, HK of apical & lat wall, ant & antsept wall best preserved, mitral: mechanical prosthesis was present & well seated, LA moderately dilated, RV systolic fxn mildly reduced, PASP nl  . Coronary artery disease     Cath 2005- patent LIMA-LAD, SVG-OM grafts, moderate MR  . Prostate cancer ([place]) 04/2009  . Hyperlipidemia   .  COPD (chronic obstructive pulmonary disease) ([place])     URI 9/12  . DM type 2 (diabetes mellitus, type 2) ([place])   . PNA (pneumonia) 03/2010  . Glaucoma   . Hard of hearing   . Myocardial infarction ([place])   . Gout   . Adenomatous polyps   . Diverticulosis     left side   . H/O mitral valve prolapse   . Sigmoid diverticulitis     perforated    Medications:  Scheduled:  . aspirin EC  81 mg Oral Daily  . budesonide-formoterol  2 puff Inhalation BID  . cefTRIAXone (ROCEPHIN)  IV  1 g Intravenous Q24H  . digoxin  0.125 mg Oral Daily  . doxycycline  100 mg Oral Q12H  . insulin aspart  0-15 Units Subcutaneous TID WC  . insulin aspart  0-5 Units Subcutaneous QHS  . latanoprost  1 drop Both Eyes QHS  . simvastatin  40 mg Oral QHS  . sodium chloride flush  3 mL Intravenous Q12H  .  sodium chloride flush  3 mL Intravenous Q12H  . tamsulosin  0.4 mg Oral Daily  . warfarin  2.5 mg Oral Once per day on Sun Tue Thu Fri  . [START ON 02/02/2016] warfarin  5 mg Oral Once per day on Mon Wed Sat  . Warfarin - Pharmacist Dosing Inpatient   Does not apply q1800    Assessment: [name] with a PMH of Afib on warfarin. Home dose is 2.5mg  on Tu,Thr,Fri,Sun and 5mg  on M,W,Sat. INR on admission is 2.8.   Goal of Therapy:  INR 2-3 Monitor platelets by anticoagulation protocol: Yes   Plan:  Resume home dose. Follow up INR in the AM. Thank you for the consult. Pharmacy to continue to monitor.  [name], Pharm.D Clinical Pharmacist  02/01/2016,3:33 PM

## 2016-02-01 NOTE — Progress Notes (Signed)
Transport at [facility] up, no ETA at this time. Stated that they will call with ETA before 1900. [name]

## 2016-02-01 NOTE — Progress Notes (Signed)
Pharmacy Antibiotic Note  [name] is a 75 y.o. male admitted on 01/31/2016 with pneumonia.  Pharmacy has been consulted for vancomycin dosing.  Blood cx pending CT: multifocal pneumonia Plan: TBW 66.2kg  IBW 59.2kg DW 66.2kg Vd 46L [place] 0.035 hr-1  t1/2 20 hours Vancomycin 1 gram q 24 hours ordered with stacked dosing. Level before 5th dose. Target trough 15-20.  Height: 5\' 4"  (162.6 cm) Weight: 146 lb (66.225 kg) IBW/kg (Calculated) : 59.2  Temp (24hrs), Avg:100.2 F (37.9 C), Min:99.9 F (37.7 C), Max:100.5 F (38.1 C)   Recent Labs Lab 01/31/16 1627 01/31/16 2214 02/01/16 0126  WBC 11.8*  --   --   CREATININE 1.44*  --   --   LATICACIDVEN  --  1.3 0.9    Estimated Creatinine Clearance: 36.5 mL/min (by C-G formula based on Cr of 1.44).    Allergies  Allergen Reactions  . Acetaminophen-Codeine Hives  . Altace [Ramipril] Other (See Comments)    Other reaction(s): Weal  . Codeine Hives  . Other     Other reaction(s): Fever  . Sulfonamide Derivatives Hives  . Sulfa Antibiotics Rash    Other reaction(s): Weal    Antimicrobials this admission:   Dose adjustments this admission:   Microbiology results:  BCx:   UCx:    Sputum:    MRSA PCR: last (+)  Thank you for allowing pharmacy to be a part of this patient's care.  Rodnesha Elie S 02/01/2016 3:44 AM

## 2016-02-01 NOTE — Progress Notes (Signed)
Initial Nutrition Assessment     INTERVENTION:  Meals and snacks: Cater to pt preferences    NUTRITION DIAGNOSIS:    (None at this time) related to   as evidenced by  .    GOAL:   Patient will meet greater than or equal to 90% of their needs    MONITOR:    (Energy intake)  REASON FOR ASSESSMENT:   Malnutrition Screening Tool    ASSESSMENT:      Pt admitted with chest pain, pneumonia, recently taken off dobutamine drip.  Planning transfer to [facility]  Past Medical History  Diagnosis Date  . Chronic atrial fibrillation (HCC)     a. warfarin; b. CHADSVASc 6 (CHF, HTN, age x 2, DM, vascular disease) giving him an estimated annual stroke risk of 9.8%  . Hypertension   . Hyperkalemia   . Chronic systolic CHF (congestive heart failure) ([place])     a. echo 10/2014: EF 30-35%, AK of inf & post myocardium, HK of apical & lat wall, ant & antsept wall best preserved, mitral: mechanical prosthesis was present & well seated, LA moderately dilated, RV systolic fxn mildly reduced, PASP nl  . Coronary artery disease     Cath 2005- patent LIMA-LAD, SVG-OM grafts, moderate MR  . Prostate cancer ([place]) 04/2009  . Hyperlipidemia   . COPD (chronic obstructive pulmonary disease) ([place])     URI 9/12  . DM type 2 (diabetes mellitus, type 2) ([place])   . PNA (pneumonia) 03/2010  . Glaucoma   . Hard of hearing   . Myocardial infarction ([place])   . Gout   . Adenomatous polyps   . Diverticulosis     left side   . H/O mitral valve prolapse   . Sigmoid diverticulitis     perforated    Current Nutrition: eating 100%  Food/Nutrition-Related History: reports eating "like a horse" prior to admission.  " I need someone to stand at the bedside and just keep the trays coming."   Scheduled Medications:  . aspirin EC  81 mg Oral Daily  . budesonide-formoterol  2 puff Inhalation BID  . cefTRIAXone (ROCEPHIN)  IV  1 g Intravenous Q24H  . digoxin  0.125 mg Oral Daily  . doxycycline  100 mg Oral Q12H   . insulin aspart  0-15 Units Subcutaneous TID WC  . insulin aspart  0-5 Units Subcutaneous QHS  . latanoprost  1 drop Both Eyes QHS  . simvastatin  40 mg Oral QHS  . sodium chloride flush  3 mL Intravenous Q12H  . sodium chloride flush  3 mL Intravenous Q12H  . tamsulosin  0.4 mg Oral Daily       Electrolyte/Renal Profile and Glucose Profile:   Recent Labs Lab 01/31/16 1625 01/31/16 1627 02/01/16 0809  NA  --  141 137  K  --  4.2 4.7  CL  --  109 109  CO2  --  20* 17*  BUN  --  35* 43*  CREATININE  --  1.44* 1.73*  CALCIUM  --  9.0 8.4*  MG 2.2  --   --   GLUCOSE  --  163* 242*   Protein Profile:  Recent Labs Lab 01/31/16 1627  ALBUMIN 4.3    Gastrointestinal Profile: Last BM: 2/9    Weight Change: pt reports recent fluid wt gain prior to admission    Diet Order:  Diet heart healthy/carb modified Room service appropriate?: Yes; Fluid consistency:: Thin  Skin:   reviewed  Height:   Ht Readings from Last 1 Encounters:  02/01/16 5\' 6"  (1.676 m)    Weight:   Wt Readings from Last 1 Encounters:  02/01/16 151 lb 8 oz (68.72 kg)    Ideal Body Weight:     BMI:  Body mass index is 24.46 kg/(m^2).   EDUCATION NEEDS:   No education needs identified at this time  LOW Care Level  Laurian Edrington B. Zenia Resides, [place], [place] (pager) Weekend/On-Call pager [phone])

## 2016-02-01 NOTE — Progress Notes (Signed)
Patient alert and oriented. No complaints of pain or chest discomfort. Up in chair eating breakfast. Patient awaiting tx to [facility] once a bed becomes available.

## 2016-02-01 NOTE — Progress Notes (Signed)
Report given to [place] at [facility]. Wilnette Kales

## 2016-02-01 NOTE — Consult Note (Signed)
PULMONARY CONSULT NOTE  Requesting MD/Service: Bridgett Larsson Date of initial consultation: 02/10 Reason for consultation: PNA  HPI:  22 M with severe cardiomyopathy, CAF admitted with increased dyspnea, rattling cough. He underwent CT chest with evidence of LLL PNA. Also noted is an increase in procalcitonin. He is presently comfortable on [place] O2  Past Medical History  Diagnosis Date  . Chronic atrial fibrillation (HCC)     a. warfarin; b. CHADSVASc 6 (CHF, HTN, age x 2, DM, vascular disease) giving him an estimated annual stroke risk of 9.8%  . Hypertension   . Hyperkalemia   . Chronic systolic CHF (congestive heart failure) ([place])     a. echo 10/2014: EF 30-35%, AK of inf & post myocardium, HK of apical & lat wall, ant & antsept wall best preserved, mitral: mechanical prosthesis was present & well seated, LA moderately dilated, RV systolic fxn mildly reduced, PASP nl  . Coronary artery disease     Cath 2005- patent LIMA-LAD, SVG-OM grafts, moderate MR  . Prostate cancer ([place]) 04/2009  . Hyperlipidemia   . COPD (chronic obstructive pulmonary disease) ([place])     URI 9/12  . DM type 2 (diabetes mellitus, type 2) ([place])   . PNA (pneumonia) 03/2010  . Glaucoma   . Hard of hearing   . Myocardial infarction ([place])   . Gout   . Adenomatous polyps   . Diverticulosis     left side   . H/O mitral valve prolapse   . Sigmoid diverticulitis     perforated    Past Surgical History  Procedure Laterality Date  . Coronary artery bypass graft  1989    LIMA-LAD, SVG-OM  . Mitral valve replacement  2007    Mechanical, performed at [facility]  . Hernia repair    . External ear surgery      drum  . Colon resection  01/12/2012    sigmoid colectomy and colostomy for diverticulitis  . Cataract extraction    . Colonoscopy  11/23/2008    MEDICATIONS: I have reviewed all medications and confirmed regimen as documented  Social History   Social History  . Marital Status: Married    Spouse Name: N/A  . Number  of Children: 3  . Years of Education: N/A   Occupational History  . retired Airline pilot    Social History Main Topics  . Smoking status: Former Smoker -- 4.00 packs/day for 23 years    Types: Cigarettes    Quit date: 12/23/1987  . Smokeless tobacco: Never Used  . Alcohol Use: No  . Drug Use: No  . Sexual Activity: Not on file   Other Topics Concern  . Not on file   Social History Narrative   Lives in [place], [place] with wife.     Family History  Problem Relation Age of Onset  . Coronary artery disease Mother   . Heart attack Mother     x10  . Heart disease Mother   . Stroke Father   . Heart disease Father   . Heart attack Brother     with bypass   . Heart attack Sister     with bypass   . Cancer Sister     bone  . Pancreatic cancer Brother     pancreatic  . Heart disease Maternal Grandmother   . Heart disease Maternal Grandfather   . Leukemia Sister   . Cancer Maternal Aunt     ? type    ROS: No myalgias/arthralgias, unexplained weight loss  or weight gain No new focal weakness or sensory deficits No otalgia, hearing loss, visual changes, nasal and sinus symptoms, mouth and throat problems No neck pain or adenopathy No abdominal pain, N/V/D, diarrhea, change in bowel pattern No dysuria, change in urinary pattern No calf tenderness   Filed Vitals:   02/01/16 0905 02/01/16 0923 02/01/16 1144 02/01/16 1559  BP: 104/53 93/57 98/70  124/80  Pulse: 89 52 45 100  Temp:  97.8 F (36.6 C) 97.4 F (36.3 C)   TempSrc:  Oral Oral   Resp: 24 18 20    Height:      Weight:  68.72 kg (151 lb 8 oz)    SpO2: 100% 100% 100% 100%     EXAM:  Gen: WDWN, No overt respiratory distress HEENT: NCAT, oropharynx normal Neck: Supple without LAN, thyromegaly, + JVD Lungs: breath sounds full, percussion note normal, L>R basilar crackles Cardiovascular: IRIR, rate controlled, mechanical click Abdomen: Soft, nontender, normal BS Ext: without clubbing, cyanosis, edema Neuro:  CNs grossly intact, motor and sensory intact, DTRs symmetric  DATA:   BMP Latest Ref Rng 02/01/2016 01/31/2016 01/24/2016  Glucose 65 - 99 mg/dL 242(H) 163(H) 132(H)  BUN 6 - 20 mg/dL 43(H) 35(H) 38(H)  Creatinine 0.61 - 1.24 mg/dL 1.73(H) 1.44(H) 1.34(H)  Sodium 135 - 145 mmol/L 137 141 140  Potassium 3.5 - 5.1 mmol/L 4.7 4.2 4.5  Chloride 101 - 111 mmol/L 109 109 109  CO2 22 - 32 mmol/L 17(L) 20(L) 28  Calcium 8.9 - 10.3 mg/dL 8.4(L) 9.0 9.6    CBC Latest Ref Rng 02/01/2016 01/31/2016 12/17/2015  WBC 3.8 - 10.6 K/uL 10.5 11.8(H) 4.4  Hemoglobin 13.0 - 18.0 g/dL 10.0(L) 12.0(L) 9.4(L)  Hematocrit 40.0 - 52.0 % 30.5(L) 37.1(L) 27.7(L)  Platelets 150 - 440 K/uL 118(L) 158 145(L)   PCT: 4.34 CXR:  Cardiomegaly, retrocardiac opacity CT chest: LLL interstitial and alveolar infiltrates  IMPRESSION:   Severe cardiomyopathy - Cards following LLL PNA, NOS - recent hospitalization makes this HCAP   PLAN:  Cont vanc Change pip-tazo to ceftriaxone Doxycycline as ordered PCT algorithm Strep and legionella antigens ordered Narrow antibiotics as able Complete 10 days abx Optimize Rx for CHF  PCCM will sign off. Please call if we can be of further assistance   Wilhelmina Mcardle, MD [facility] Pulmonary, Critical Care Medicine

## 2016-02-01 NOTE — Progress Notes (Signed)
[facility] Progress Note Patient Name: [name] DOB: 1941/09/17 MRN: [id number]   Date of Service  02/01/2016  HPI/Events of Note  New patient to ICU w/ multi-focal opacities on Chest CT concerning for pneumonia. Presented in A fib w/ RVR now rate controlled on telemetry. Previous hypotension has resolved. Patient on Dobutamine infusion with goal SVO2 & MAP in order but no central access. Awaiting bed at [place].  eICU Interventions  1. D/C Dobutamine 2. Checking Procalcitonin per algorithm 3. Respiratory Viral Panel PCR - Influenza PCR already obtained 4. Urine Strep & Legionella Antigens 5. Adding Doxycycline to antibiotic regimen for atypical coverage     Intervention Category Evaluation Type: New Patient Evaluation  Tera Partridge 02/01/2016, 5:29 AM

## 2016-02-01 NOTE — Discharge Instructions (Signed)
Heart healthy diet. °Activity as tolerated. °

## 2016-02-01 NOTE — Progress Notes (Signed)
Patient transferred from CCU, oriented to room, fall, and safety contract reviewed. Focused assessment as charted. No complaints at this time. Skin verified with Junious Dresser, RN. Telemetry box verified with [place], NT. Wilnette Kales

## 2016-02-01 NOTE — Progress Notes (Signed)
Patient's daughter, Otila Kluver, called this nursing unit very upset stating that a staff member had come into the patient's room, forcing to sign a transfer form that may result in the patient paying for the transport out of pocket. Unfortunately, I was unaware of the situation and offered for the daughter to speak with care management and/or social worker regarding transfer payment issues. According to CM previous noted, family didn't care about whether or not they would be responsible for transfer payment. They wanted Mr. Yokoyama at [facility] with primary care providers, ie cardiology.

## 2016-02-05 LAB — CULTURE, BLOOD (ROUTINE X 2)
Culture: NO GROWTH
Culture: NO GROWTH

## 2016-02-06 LAB — GLUCOSE, CAPILLARY: GLUCOSE-CAPILLARY: 142 mg/dL — AB (ref 65–99)

## 2016-02-11 ENCOUNTER — Ambulatory Visit (INDEPENDENT_AMBULATORY_CARE_PROVIDER_SITE_OTHER): Payer: Medicare Other

## 2016-02-11 DIAGNOSIS — Z5181 Encounter for therapeutic drug level monitoring: Secondary | ICD-10-CM | POA: Diagnosis not present

## 2016-02-11 DIAGNOSIS — Z7901 Long term (current) use of anticoagulants: Secondary | ICD-10-CM | POA: Diagnosis not present

## 2016-02-11 DIAGNOSIS — Z9889 Other specified postprocedural states: Secondary | ICD-10-CM | POA: Diagnosis not present

## 2016-02-11 DIAGNOSIS — I059 Rheumatic mitral valve disease, unspecified: Secondary | ICD-10-CM | POA: Diagnosis not present

## 2016-02-11 DIAGNOSIS — I4891 Unspecified atrial fibrillation: Secondary | ICD-10-CM

## 2016-02-11 DIAGNOSIS — I482 Chronic atrial fibrillation, unspecified: Secondary | ICD-10-CM

## 2016-02-11 LAB — POCT INR: INR: 3

## 2016-02-13 DIAGNOSIS — J189 Pneumonia, unspecified organism: Secondary | ICD-10-CM | POA: Diagnosis not present

## 2016-02-21 ENCOUNTER — Ambulatory Visit: Payer: Medicare Other

## 2016-02-27 ENCOUNTER — Ambulatory Visit (INDEPENDENT_AMBULATORY_CARE_PROVIDER_SITE_OTHER): Payer: Medicare Other

## 2016-02-27 DIAGNOSIS — Z9889 Other specified postprocedural states: Secondary | ICD-10-CM | POA: Diagnosis not present

## 2016-02-27 DIAGNOSIS — I4891 Unspecified atrial fibrillation: Secondary | ICD-10-CM | POA: Diagnosis not present

## 2016-02-27 DIAGNOSIS — Z5181 Encounter for therapeutic drug level monitoring: Secondary | ICD-10-CM | POA: Diagnosis not present

## 2016-02-27 DIAGNOSIS — I059 Rheumatic mitral valve disease, unspecified: Secondary | ICD-10-CM | POA: Diagnosis not present

## 2016-02-27 DIAGNOSIS — Z7901 Long term (current) use of anticoagulants: Secondary | ICD-10-CM | POA: Diagnosis not present

## 2016-02-27 DIAGNOSIS — I482 Chronic atrial fibrillation, unspecified: Secondary | ICD-10-CM

## 2016-02-27 LAB — POCT INR: INR: 4

## 2016-02-28 DIAGNOSIS — R0609 Other forms of dyspnea: Secondary | ICD-10-CM | POA: Diagnosis not present

## 2016-02-28 DIAGNOSIS — I5022 Chronic systolic (congestive) heart failure: Secondary | ICD-10-CM | POA: Diagnosis not present

## 2016-02-28 DIAGNOSIS — I482 Chronic atrial fibrillation: Secondary | ICD-10-CM | POA: Diagnosis not present

## 2016-03-07 ENCOUNTER — Other Ambulatory Visit
Admission: RE | Admit: 2016-03-07 | Discharge: 2016-03-07 | Disposition: A | Discharge status: Home / Self Care | Payer: Medicare Other | Source: Ambulatory Visit | Attending: Internal Medicine | Admitting: Internal Medicine

## 2016-03-07 DIAGNOSIS — Z79899 Other long term (current) drug therapy: Secondary | ICD-10-CM | POA: Diagnosis not present

## 2016-03-07 DIAGNOSIS — I5022 Chronic systolic (congestive) heart failure: Secondary | ICD-10-CM | POA: Insufficient documentation

## 2016-03-07 LAB — BASIC METABOLIC PANEL
Anion gap: 6 (ref 5–15)
BUN: 51 mg/dL — AB (ref 6–20)
CALCIUM: 8.8 mg/dL — AB (ref 8.9–10.3)
CO2: 25 mmol/L (ref 22–32)
CREATININE: 1.9 mg/dL — AB (ref 0.61–1.24)
Chloride: 103 mmol/L (ref 101–111)
GFR calc Af Amer: 38 mL/min — ABNORMAL LOW (ref >60)
GFR, EST NON AFRICAN AMERICAN: 33 mL/min — AB (ref >60)
Glucose, Bld: 154 mg/dL — ABNORMAL HIGH (ref 65–99)
POTASSIUM: 4 mmol/L (ref 3.5–5.1)
SODIUM: 134 mmol/L — AB (ref 135–145)

## 2016-03-17 ENCOUNTER — Ambulatory Visit
Admission: RE | Admit: 2016-03-17 | Discharge: 2016-03-17 | Disposition: A | Discharge status: Home / Self Care | Payer: Medicare Other | Source: Ambulatory Visit | Attending: Pulmonary Disease | Admitting: Pulmonary Disease

## 2016-03-17 DIAGNOSIS — J189 Pneumonia, unspecified organism: Secondary | ICD-10-CM | POA: Insufficient documentation

## 2016-03-17 DIAGNOSIS — R590 Localized enlarged lymph nodes: Secondary | ICD-10-CM | POA: Diagnosis not present

## 2016-03-17 DIAGNOSIS — R918 Other nonspecific abnormal finding of lung field: Secondary | ICD-10-CM | POA: Diagnosis present

## 2016-03-17 DIAGNOSIS — J439 Emphysema, unspecified: Secondary | ICD-10-CM | POA: Insufficient documentation

## 2016-03-18 ENCOUNTER — Telehealth: Payer: Self-pay | Admitting: Pulmonary Disease

## 2016-03-18 NOTE — Progress Notes (Signed)
Quick Note:  Called and spoke with pt. Reviewed results and recs. Pt voiced understanding and had no further questions. ______ 

## 2016-03-18 NOTE — Telephone Encounter (Signed)
Notes Recorded by Juanito Doom, MD on 03/18/2016 at 8:09 AM A, Please let him know that all his pneumonia has cleared up. Thanks B  Called and spoke pt. Reviewed results and recs. Pt voiced understanding and had no further questions. Nothing further needed.

## 2016-03-19 ENCOUNTER — Ambulatory Visit (INDEPENDENT_AMBULATORY_CARE_PROVIDER_SITE_OTHER): Payer: Medicare Other

## 2016-03-19 DIAGNOSIS — N183 Chronic kidney disease, stage 3 (moderate): Secondary | ICD-10-CM | POA: Diagnosis not present

## 2016-03-19 DIAGNOSIS — Z5181 Encounter for therapeutic drug level monitoring: Secondary | ICD-10-CM

## 2016-03-19 DIAGNOSIS — Z7901 Long term (current) use of anticoagulants: Secondary | ICD-10-CM | POA: Diagnosis not present

## 2016-03-19 DIAGNOSIS — I4891 Unspecified atrial fibrillation: Secondary | ICD-10-CM

## 2016-03-19 DIAGNOSIS — Z9889 Other specified postprocedural states: Secondary | ICD-10-CM | POA: Diagnosis not present

## 2016-03-19 DIAGNOSIS — I482 Chronic atrial fibrillation, unspecified: Secondary | ICD-10-CM

## 2016-03-19 DIAGNOSIS — I5020 Unspecified systolic (congestive) heart failure: Secondary | ICD-10-CM | POA: Diagnosis not present

## 2016-03-19 DIAGNOSIS — I059 Rheumatic mitral valve disease, unspecified: Secondary | ICD-10-CM

## 2016-03-19 LAB — POCT INR: INR: 3

## 2016-03-20 ENCOUNTER — Ambulatory Visit (INDEPENDENT_AMBULATORY_CARE_PROVIDER_SITE_OTHER): Payer: Medicare Other | Admitting: Pulmonary Disease

## 2016-03-20 ENCOUNTER — Encounter: Payer: Self-pay | Admitting: Pulmonary Disease

## 2016-03-20 VITALS — BP 118/66 | HR 65 | Ht 66.0 in | Wt 161.0 lb

## 2016-03-20 DIAGNOSIS — T17908A Unspecified foreign body in respiratory tract, part unspecified causing other injury, initial encounter: Secondary | ICD-10-CM

## 2016-03-20 DIAGNOSIS — R590 Localized enlarged lymph nodes: Secondary | ICD-10-CM

## 2016-03-20 DIAGNOSIS — J13 Pneumonia due to Streptococcus pneumoniae: Secondary | ICD-10-CM | POA: Diagnosis not present

## 2016-03-20 DIAGNOSIS — J438 Other emphysema: Secondary | ICD-10-CM | POA: Diagnosis not present

## 2016-03-20 DIAGNOSIS — R599 Enlarged lymph nodes, unspecified: Secondary | ICD-10-CM

## 2016-03-20 NOTE — Assessment & Plan Note (Signed)
His pneumonia findings on chest CT from 2016 have completely resolved. He has normal-appearing lung parenchyma at this point and stable but slightly elevated mediastinal lymphadenopathy. The lymph nodes of been enlarged slightly for years and have not changed so it's unlikely that they represent a malignant process. At this point he needs no further chest imaging.

## 2016-03-20 NOTE — Patient Instructions (Signed)
Keep taking the Spiriva as you're doing Take Symbicort 2 sprays twice a day Keep using the oxygen as your doing I want you to focus on taking small bites of food and only small sips of water. Do not eat and then lie down within 3 hours per We will see you back in 3 months or sooner if needed

## 2016-03-20 NOTE — Assessment & Plan Note (Signed)
Again, these were noted to be stable on the March 2017 CT chest. There is no further imaging necessary at this time.

## 2016-03-20 NOTE — Assessment & Plan Note (Signed)
I believe that this is the cause of his recurrent cough is the cough occurs primarily around the time he eats.  It is likely also the cause of his recurrent left lower lobe pneumonia.  Plan: He was encouraged again today to take small bites, small sips, and swallow slowly. He was also instructed not to eat within 3 hours of bedtime.

## 2016-03-20 NOTE — Assessment & Plan Note (Signed)
He has severe COPD but fortunately this has been a stable interval for him. He has been using the Symbicort incorrectly. We addressed this today. He has a chronic cough which I believe is more related to recurrent aspiration. It should be noted that he had findings of aspiration on a 2013 modified barium swallow. Apparently the modified barium swallow at [facility] in 2016 was normal.  Plan: Use Symbicort 2 puffs twice a day, not daily Use Spiriva daily

## 2016-03-20 NOTE — Progress Notes (Signed)
Subjective:    Patient ID: [name], male    DOB: September 16, 1941, 75 y.o.   MRN: [id number]  Synopsis: Cesareo Sotolongo was first seen in the [facility] Pulmonary office in 10/2012 for COPD.  He had been admitted to the hospital for bronchitis in months prior.  His spirometry showed clear obstruction and an FEV1 of 48% predicted.  He also has a history of a CABG and Mitral valve replacement.  He has recurrent  HPI   Chief Complaint  Patient presents with  . Follow-up    review ct chest.  pt has no breathing complaints today.    Dray says he has been OK lately. He says that he hasn't had any problems breathing.  He coughs some, sometimes in spells, but not a whole lot.  He has a tingling in his throat, nothing will come up when he coughs a lot.  The cough is usually when he is eating.  He had a modified barium swallow screen.    He is no longer being considered for an LVAD right now.    Past Medical History  Diagnosis Date  . Chronic atrial fibrillation (HCC)     a. warfarin; b. CHADSVASc 6 (CHF, HTN, age x 2, DM, vascular disease) giving him an estimated annual stroke risk of 9.8%  . Hypertension   . Hyperkalemia   . Chronic systolic CHF (congestive heart failure) ([place])     a. echo 10/2014: EF 30-35%, AK of inf & post myocardium, HK of apical & lat wall, ant & antsept wall best preserved, mitral: mechanical prosthesis was present & well seated, LA moderately dilated, RV systolic fxn mildly reduced, PASP nl  . Coronary artery disease     Cath 2005- patent LIMA-LAD, SVG-OM grafts, moderate MR  . Prostate cancer ([place]) 04/2009  . Hyperlipidemia   . COPD (chronic obstructive pulmonary disease) ([place])     URI 9/12  . DM type 2 (diabetes mellitus, type 2) ([place])   . PNA (pneumonia) 03/2010  . Glaucoma   . Hard of hearing   . Myocardial infarction ([place])   . Gout   . Adenomatous polyps   . Diverticulosis     left side   . H/O mitral valve prolapse   . Sigmoid diverticulitis     perforated       Review of Systems  Constitutional: Negative for fever, chills and fatigue.  HENT: Negative for congestion, postnasal drip and rhinorrhea.   Respiratory: Positive for cough, shortness of breath and wheezing. Negative for choking.   Cardiovascular: Negative for chest pain, palpitations and leg swelling.  Gastrointestinal: Negative for nausea, abdominal pain, diarrhea, constipation, abdominal distention and rectal pain.       Objective:   Physical Exam  Filed Vitals:   03/20/16 1141  BP: 118/66  Pulse: 65  Height: 5\' 6"  (1.676 m)  Weight: 161 lb (73.029 kg)  SpO2: 96%  RA  Gen:  Chronically ill appearing, no acute distress HEENT: NCAT, EOMi, OP clear PULM: Wheezing bilaterally, rhonchi L>R, normal effort CV: Irreg Irreg, mechanical S1, no JVD AB: BS+, soft, nontender, no hsm Ext: warm, no edema, no clubbing, no cyanosis   10/25/2012 CXR >> cardiomegally, no clear infiltrate 10/25/2012 Spirometry >> clear obstruction on flow volume loop, FEV1 48% predicted 11/2012 CT chest >> LLL pneumonia, enlarged mediastinal lymph nodes 02/22/2013 CT chest [facility] >> complete resolution of pneumonia; mediastinal lymphadenopathy persists, 1.7 cm 4R node, 1.0cm 7 node 02/2013 Modified Barium Swallow [facility] >>  mild oropharyngeal dysphagia characterized by decreased pharyngeal pressure generation resulting in mild vallecular residue; no tracheal penetration.  Educated by Speech on swallowing technique (sit up, small bites, eat slowly) 08/2013 CT chest > improved mediastinal lymphadenopathy 12/19/2013 > bilateral fluffy infiltrates consistent with pneumonia May 2015 CT chest> no change in chronic mediastinal lymphadenopathy May 2016 CXR > nodularity left lung  March 2017 CT chest images personally reviewed showing complete resolution of the left lung abnormality, mediastinal lymphadenopathy is stable.    Assessment & Plan:   COPD (chronic obstructive pulmonary disease) ([place]) He has severe COPD but  fortunately this has been a stable interval for him. He has been using the Symbicort incorrectly. We addressed this today. He has a chronic cough which I believe is more related to recurrent aspiration. It should be noted that he had findings of aspiration on a 2013 modified barium swallow. Apparently the modified barium swallow at [place] in 2016 was normal.  Plan: Use Symbicort 2 puffs twice a day, not daily Use Spiriva daily  Pneumonia His pneumonia findings on chest CT from 2016 have completely resolved. He has normal-appearing lung parenchyma at this point and stable but slightly elevated mediastinal lymphadenopathy. The lymph nodes of been enlarged slightly for years and have not changed so it's unlikely that they represent a malignant process. At this point he needs no further chest imaging.  Mediastinal lymphadenopathy Again, these were noted to be stable on the March 2017 CT chest. There is no further imaging necessary at this time.  Aspiration into respiratory tract I believe that this is the cause of his recurrent cough is the cough occurs primarily around the time he eats.  It is likely also the cause of his recurrent left lower lobe pneumonia.  Plan: He was encouraged again today to take small bites, small sips, and swallow slowly. He was also instructed not to eat within 3 hours of bedtime.    Updated Medication List Outpatient Encounter Prescriptions as of 03/20/2016  Medication Sig  . aspirin EC 81 MG tablet Take 1 tablet (81 mg total) by mouth daily.  . beta carotene w/minerals (OCUVITE) tablet Take 1 tablet by mouth daily.  . budesonide-formoterol (SYMBICORT) 160-4.5 MCG/ACT inhaler Inhale 2 puffs into the lungs 2 (two) times daily.  . Cholecalciferol (VITAMIN D) 2000 UNITS tablet Take 2,000 Units by mouth daily.  . digoxin (DIGOX) 0.125 MG tablet Take 1 tablet by mouth  daily  . HYDROcodone-acetaminophen (NORCO/VICODIN) 5-325 MG tablet Take 1-2 tabs every 4-6 hrs PRN pain   . latanoprost (XALATAN) 0.005 % ophthalmic solution Place 1 drop into both eyes at bedtime.   Marland Kitchen losartan (COZAAR) 100 MG tablet Take 25 mg by mouth daily.   . metFORMIN (GLUCOPHAGE) 500 MG tablet Take 1,000 mg by mouth 2 (two) times daily with a meal.  . metoprolol succinate (TOPROL-XL) 50 MG 24 hr tablet Take 50 mg by mouth daily. Take with or immediately following a meal.  . simvastatin (ZOCOR) 40 MG tablet Take 40 mg by mouth at bedtime.   Marland Kitchen SPIRIVA RESPIMAT 2.5 MCG/ACT AERS Use 2 puffs daily  . Tamsulosin HCl (FLOMAX) 0.4 MG CAPS Take 0.4 mg by mouth daily.   Marland Kitchen torsemide (DEMADEX) 20 MG tablet Take 40 mg by mouth 2 (two) times daily.  Marland Kitchen warfarin (COUMADIN) 2.5 MG tablet Take as directed by  Coumadin Clinic  . warfarin (COUMADIN) 2.5 MG tablet Take 2.5-5 mg by mouth daily. Take 2 tablets (5mg ) on Wednesday and Saturday Take  1 tablet (2.5mg ) on Monday, Tuesday, Thursday, Friday and Sunday  . [DISCONTINUED] oxyCODONE (ROXICODONE) 5 MG immediate release tablet Take 1 tablet (5 mg total) by mouth every 8 (eight) hours as needed. (Patient not taking: Reported on 03/20/2016)  . [DISCONTINUED] predniSONE (DELTASONE) 10 MG tablet Then take 30mg  po daily for 4 days, then take 20mg  po daily for 4 days, then take 10mg  po daily for 3 days (Patient not taking: Reported on 03/20/2016)  . [DISCONTINUED] spironolactone (ALDACTONE) 25 MG tablet Take 25 mg by mouth daily. Reported on 03/20/2016   No facility-administered encounter medications on file as of 03/20/2016.

## 2016-04-11 ENCOUNTER — Other Ambulatory Visit
Admission: RE | Admit: 2016-04-11 | Discharge: 2016-04-11 | Disposition: A | Discharge status: Home / Self Care | Payer: Medicare Other | Source: Ambulatory Visit | Attending: Internal Medicine | Admitting: Internal Medicine

## 2016-04-11 DIAGNOSIS — I5022 Chronic systolic (congestive) heart failure: Secondary | ICD-10-CM | POA: Diagnosis not present

## 2016-04-11 LAB — BASIC METABOLIC PANEL
Anion gap: 7 (ref 5–15)
BUN: 35 mg/dL — ABNORMAL HIGH (ref 6–20)
CHLORIDE: 105 mmol/L (ref 101–111)
CO2: 27 mmol/L (ref 22–32)
Calcium: 9.2 mg/dL (ref 8.9–10.3)
Creatinine, Ser: 1.62 mg/dL — ABNORMAL HIGH (ref 0.61–1.24)
GFR calc non Af Amer: 39 mL/min — ABNORMAL LOW (ref >60)
GFR, EST AFRICAN AMERICAN: 46 mL/min — AB (ref >60)
Glucose, Bld: 255 mg/dL — ABNORMAL HIGH (ref 65–99)
POTASSIUM: 4 mmol/L (ref 3.5–5.1)
SODIUM: 139 mmol/L (ref 135–145)

## 2016-04-16 ENCOUNTER — Ambulatory Visit (INDEPENDENT_AMBULATORY_CARE_PROVIDER_SITE_OTHER): Payer: Medicare Other

## 2016-04-16 DIAGNOSIS — I4891 Unspecified atrial fibrillation: Secondary | ICD-10-CM | POA: Diagnosis not present

## 2016-04-16 DIAGNOSIS — Z5181 Encounter for therapeutic drug level monitoring: Secondary | ICD-10-CM

## 2016-04-16 DIAGNOSIS — I482 Chronic atrial fibrillation, unspecified: Secondary | ICD-10-CM

## 2016-04-16 DIAGNOSIS — Z7901 Long term (current) use of anticoagulants: Secondary | ICD-10-CM | POA: Diagnosis not present

## 2016-04-16 DIAGNOSIS — I059 Rheumatic mitral valve disease, unspecified: Secondary | ICD-10-CM

## 2016-04-16 DIAGNOSIS — Z9889 Other specified postprocedural states: Secondary | ICD-10-CM

## 2016-04-16 LAB — POCT INR: INR: 4.2

## 2016-04-21 DIAGNOSIS — N390 Urinary tract infection, site not specified: Secondary | ICD-10-CM | POA: Diagnosis not present

## 2016-04-23 ENCOUNTER — Telehealth: Payer: Self-pay | Admitting: Cardiovascular Disease

## 2016-04-23 NOTE — Telephone Encounter (Signed)
Pt calling stating he would like a call back.

## 2016-04-23 NOTE — Telephone Encounter (Signed)
Returned call to pt, pt states he has a kidney infection, started on Cipro 04/21/16 250mg  tablets took 2 tablets BID x first 2 days, then decrease to 1 tablet BID x 7 days.   Advised pt Cipro can increase effects of Coumadin and cause INR to become elevated.  Made pt appt on Friday 04/25/16 at 10:15 in [place] office to check INR. Pt verbalized understanding.

## 2016-04-25 ENCOUNTER — Ambulatory Visit (INDEPENDENT_AMBULATORY_CARE_PROVIDER_SITE_OTHER): Payer: Medicare Other

## 2016-04-25 DIAGNOSIS — Z5181 Encounter for therapeutic drug level monitoring: Secondary | ICD-10-CM | POA: Diagnosis not present

## 2016-04-25 DIAGNOSIS — Z7901 Long term (current) use of anticoagulants: Secondary | ICD-10-CM | POA: Diagnosis not present

## 2016-04-25 DIAGNOSIS — I482 Chronic atrial fibrillation, unspecified: Secondary | ICD-10-CM

## 2016-04-25 DIAGNOSIS — I4891 Unspecified atrial fibrillation: Secondary | ICD-10-CM | POA: Diagnosis not present

## 2016-04-25 DIAGNOSIS — I059 Rheumatic mitral valve disease, unspecified: Secondary | ICD-10-CM | POA: Diagnosis not present

## 2016-04-25 DIAGNOSIS — Z9889 Other specified postprocedural states: Secondary | ICD-10-CM | POA: Diagnosis not present

## 2016-04-25 LAB — POCT INR: INR: 3.1

## 2016-05-01 DIAGNOSIS — Z952 Presence of prosthetic heart valve: Secondary | ICD-10-CM | POA: Diagnosis not present

## 2016-05-01 DIAGNOSIS — E119 Type 2 diabetes mellitus without complications: Secondary | ICD-10-CM | POA: Diagnosis not present

## 2016-05-01 DIAGNOSIS — Z794 Long term (current) use of insulin: Secondary | ICD-10-CM | POA: Diagnosis not present

## 2016-05-01 DIAGNOSIS — I482 Chronic atrial fibrillation: Secondary | ICD-10-CM | POA: Diagnosis not present

## 2016-05-01 DIAGNOSIS — I5022 Chronic systolic (congestive) heart failure: Secondary | ICD-10-CM | POA: Diagnosis not present

## 2016-05-05 DIAGNOSIS — M62830 Muscle spasm of back: Secondary | ICD-10-CM | POA: Diagnosis not present

## 2016-05-05 DIAGNOSIS — M5136 Other intervertebral disc degeneration, lumbar region: Secondary | ICD-10-CM | POA: Diagnosis not present

## 2016-05-12 DIAGNOSIS — H353222 Exudative age-related macular degeneration, left eye, with inactive choroidal neovascularization: Secondary | ICD-10-CM | POA: Diagnosis not present

## 2016-05-14 ENCOUNTER — Ambulatory Visit (INDEPENDENT_AMBULATORY_CARE_PROVIDER_SITE_OTHER): Payer: Medicare Other | Admitting: *Deleted

## 2016-05-14 DIAGNOSIS — Z5181 Encounter for therapeutic drug level monitoring: Secondary | ICD-10-CM | POA: Diagnosis not present

## 2016-05-14 DIAGNOSIS — I482 Chronic atrial fibrillation, unspecified: Secondary | ICD-10-CM

## 2016-05-14 DIAGNOSIS — I059 Rheumatic mitral valve disease, unspecified: Secondary | ICD-10-CM | POA: Diagnosis not present

## 2016-05-14 DIAGNOSIS — Z9889 Other specified postprocedural states: Secondary | ICD-10-CM | POA: Diagnosis not present

## 2016-05-14 DIAGNOSIS — I4891 Unspecified atrial fibrillation: Secondary | ICD-10-CM

## 2016-05-14 DIAGNOSIS — Z7901 Long term (current) use of anticoagulants: Secondary | ICD-10-CM

## 2016-05-14 LAB — POCT INR: INR: 2.8

## 2016-05-26 ENCOUNTER — Telehealth: Payer: Self-pay | Admitting: Pulmonary Disease

## 2016-05-26 DIAGNOSIS — J438 Other emphysema: Secondary | ICD-10-CM

## 2016-05-26 NOTE — Telephone Encounter (Signed)
Will route message to DOD as BQ is on night shift. Thanks.

## 2016-05-26 NOTE — Telephone Encounter (Signed)
Spoke with pt, scheduled with MW tomorrow at 2:30-[place] to get a cxr prior.  This has been ordered.  Nothing further needed.

## 2016-05-26 NOTE — Telephone Encounter (Signed)
The patient has had recurrent aspiration in the past and probable should have an evaluation with CXR PA/LAT. Can we schedule him to see someone tomorrow/ASAP with a CXR PA/LAT prior to that appointment. Diagnosis Cough. Thanks.

## 2016-05-26 NOTE — Telephone Encounter (Signed)
Spoke with pt. Reports chest congestion and coughing. Cough is producing white mucus. SOB is also present.  Denies chest tightness, wheezing or fever. Symptoms started 1 week ago. Would like something called in.  BQ - please advise. Thanks.

## 2016-05-27 ENCOUNTER — Ambulatory Visit (INDEPENDENT_AMBULATORY_CARE_PROVIDER_SITE_OTHER)
Admission: RE | Admit: 2016-05-27 | Discharge: 2016-05-27 | Disposition: A | Discharge status: Home / Self Care | Payer: Medicare Other | Source: Ambulatory Visit | Attending: Pulmonary Disease | Admitting: Pulmonary Disease

## 2016-05-27 ENCOUNTER — Encounter: Payer: Self-pay | Admitting: Internal Medicine

## 2016-05-27 ENCOUNTER — Ambulatory Visit (INDEPENDENT_AMBULATORY_CARE_PROVIDER_SITE_OTHER): Payer: Medicare Other | Admitting: Internal Medicine

## 2016-05-27 VITALS — BP 104/60 | HR 85 | Temp 97.8°F | Ht 66.0 in | Wt 162.4 lb

## 2016-05-27 DIAGNOSIS — J9611 Chronic respiratory failure with hypoxia: Secondary | ICD-10-CM | POA: Diagnosis not present

## 2016-05-27 DIAGNOSIS — J441 Chronic obstructive pulmonary disease with (acute) exacerbation: Secondary | ICD-10-CM

## 2016-05-27 DIAGNOSIS — J438 Other emphysema: Secondary | ICD-10-CM

## 2016-05-27 DIAGNOSIS — R05 Cough: Secondary | ICD-10-CM | POA: Diagnosis not present

## 2016-05-27 MED ORDER — AZITHROMYCIN 250 MG PO TABS: ORAL_TABLET | ORAL | Status: DC

## 2016-05-27 MED ORDER — PREDNISONE 10 MG PO TABS: ORAL_TABLET | ORAL | Status: DC

## 2016-05-27 NOTE — Patient Instructions (Addendum)
Plan A = Automatic = symbicort 160 Take 2 puffs first thing in am and then another 2 puffs about 12 hours later   And follow am symbicort with spiriva x 2 pffs   Plan B = Backup Only use your albuterol (proair) as a rescue medication to be used if you can't catch your breath by resting or doing a relaxed purse lip breathing pattern.  - The less you use it, the better it will work when you need it. - Ok to use the inhaler up to 2 puffs  every 4 hours if you must but call for appointment if use goes up over your usual need - Don't leave home without it !!  (think of it like the spare tire for your car)   Plan C = Crisis - only use your albuterol nebulizer if you first try Plan B and it fails to help > ok to use the nebulizer up to every 4 hours but if start needing it regularly call for immediate appointment   Prednisone 10 mg take  4 each am x 2 days,   2 each am x 2 days,  1 each am x 2 days and stop   Zpak and let coumadin clinic know you are on it   As long as having night time cough/wheeze/ short of breath > add Pepcid 20 mg at bedtime   Call us Friday am if not doing better

## 2016-05-27 NOTE — Progress Notes (Signed)
Subjective:    Patient ID: [name], male    DOB: 17-Jun-1941   MRN: [id number]  Synopsis: Kestin Conatser was first seen in the [facility] Pulmonary office in 10/2012 for COPD.  He had been admitted to the hospital for bronchitis in months prior.  His spirometry showed clear obstruction and an FEV1 of 48% predicted.  He also has a history of a CABG and Mitral valve replacement.      Last ov Dr [place] Bells 03/20/16  recs rec Keep taking the Spiriva as you're doing Take Symbicort 2 sprays twice a day Keep using the oxygen as your doing I want you to focus on taking small bites of food and only small sips of water. Do not eat and then lie down within 3 hours per We will see you back in 3 months or sooner if needed    05/27/2016 acute extended ov/Ketura Sirek re: aecopd/ maint rx  Symb/spiriva / 0 2 3lpm  Chief Complaint  Patient presents with  . Acute Visit    Pt c/o increased wheezing, cough and SOB for the past 10 days. Wheezing is esp worse at night and wakes him up some nights. His cough is occ prod with white sputum. He also reports CP that comes and goes.  has proventil but doesn't really know how/ when to use it  The cp is chronic not pleuritic or ex but comes and goes s pattern, doesn't wake him up   No obvious patterns in day to day or daytime variability or assoc excess/ purulent sputum or mucus plugs or hemoptysis or cp or chest tightness,   overt sinus or hb symptoms. No unusual exp hx or h/o childhood pna/ asthma or knowledge of premature birth.    Also denies any obvious fluctuation of symptoms with weather or environmental changes or other aggravating or alleviating factors except as outlined above   Current Medications, Allergies, Complete Past Medical History, Past Surgical History, Family History, and Social History were reviewed in [employer] record.  ROS  The following are not active complaints unless bolded sore throat, dysphagia, dental problems,  itching, sneezing,  nasal congestion or excess/ purulent secretions, ear ache,   fever, chills, sweats, unintended wt loss, classically pleuritic or exertional cp,  orthopnea pnd or leg swelling, presyncope, palpitations, abdominal pain, anorexia, nausea, vomiting, diarrhea  or change in bowel or bladder habits, change in stools or urine, dysuria,hematuria,  rash, arthralgias, visual complaints, headache, numbness, weakness or ataxia or problems with walking or coordination,  change in mood/affect or memory.                     Objective:   Physical Exam    3lpm NP   Gen:  Chronically ill appearing, no acute distress HEENT: NCAT, EOMi, OP clear PULM: Wheezing bilaterally, rhonchi L>R, normal effort CV: Irreg Irreg, mechanical S1, no JVD AB: BS+, soft, nontender, no hsm Ext: warm, no edema, no clubbing, no cyanosis   10/25/2012 CXR >> cardiomegally, no clear infiltrate 10/25/2012 Spirometry >> clear obstruction on flow volume loop, FEV1 48% predicted 11/2012 CT chest >> LLL pneumonia, enlarged mediastinal lymph nodes 02/22/2013 CT chest [facility] >> complete resolution of pneumonia; mediastinal lymphadenopathy persists, 1.7 cm 4R node, 1.0cm 7 node 02/2013 Modified Barium Swallow [facility] >> mild oropharyngeal dysphagia characterized by decreased pharyngeal pressure generation resulting in mild vallecular residue; no tracheal penetration.  Educated by Speech on swallowing technique (sit up, small bites, eat slowly) 08/2013 CT  chest > improved mediastinal lymphadenopathy 12/19/2013 > bilateral fluffy infiltrates consistent with pneumonia May 2015 CT chest> no change in chronic mediastinal lymphadenopathy May 2016 CXR > nodularity left lung  March 2017 CT chest images personally reviewed showing complete resolution of the left lung abnormality, mediastinal lymphadenopathy is stable.    Assessment & Plan:   COPD (chronic obstructive pulmonary disease) ([place]) He has severe COPD but fortunately this  has been a stable interval for him. He has been using the Symbicort incorrectly. We addressed this today. He has a chronic cough which I believe is more related to recurrent aspiration. It should be noted that he had findings of aspiration on a 2013 modified barium swallow. Apparently the modified barium swallow at [facility] in 2016 was normal.  Plan: Use Symbicort 2 puffs twice a day, not daily Use Spiriva daily  Pneumonia His pneumonia findings on chest CT from 2016 have completely resolved. He has normal-appearing lung parenchyma at this point and stable but slightly elevated mediastinal lymphadenopathy. The lymph nodes of been enlarged slightly for years and have not changed so it's unlikely that they represent a malignant process. At this point he needs no further chest imaging.  Mediastinal lymphadenopathy Again, these were noted to be stable on the March 2017 CT chest. There is no further imaging necessary at this time.  Aspiration into respiratory tract I believe that this is the cause of his recurrent cough is the cough occurs primarily around the time he eats.  It is likely also the cause of his recurrent left lower lobe pneumonia.  Plan: He was encouraged again today to take small bites, small sips, and swallow slowly. He was also instructed not to eat within 3 hours of bedtime.

## 2016-05-28 ENCOUNTER — Encounter: Payer: Self-pay | Admitting: Internal Medicine

## 2016-05-28 DIAGNOSIS — J441 Chronic obstructive pulmonary disease with (acute) exacerbation: Secondary | ICD-10-CM | POA: Insufficient documentation

## 2016-05-28 NOTE — Assessment & Plan Note (Signed)
rx as of 05/27/2016 = 3lpm 24/7  Adequate control on present rx despite flare, reviewed > no change in rx needed

## 2016-05-28 NOTE — Assessment & Plan Note (Signed)
DDX of  difficult airways management almost all start with A and  include Adherence, Ace Inhibitors, Acid Reflux, Active Sinus Disease, Alpha 1 Antitripsin deficiency, Anxiety masquerading as Airways dz,  ABPA,  Allergy(esp in young), Aspiration (esp in elderly), Adverse effects of meds,  Active smokers, A bunch of PE's (a small clot burden can't cause this syndrome unless there is already severe underlying pulm or vascular dz with poor reserve) plus two Bs  = Bronchiectasis and Beta blocker use..and one C= CHF  Adherence is always the initial "prime suspect" and is a multilayered concern that requires a "trust but verify" approach in every patient - starting with knowing how to use medications, especially inhalers, correctly, keeping up with refills and understanding the fundamental difference between maintenance and prns vs those medications only taken for a very short course and then stopped and not refilled.  - confused re use of saba > see abc plan (avs) - - The proper method of use, as well as anticipated side effects, of a metered-dose inhaler are discussed and demonstrated to the patient. Improved effectiveness after extensive coaching during this visit to a level of approximately 75 % from a baseline of 50 % > continue symb hfa/spiriva resp  ? Acid (or non-acid) GERD > always difficult to exclude as up to 75% of pts in some series report no assoc GI/ Heartburn symptoms> rec max (24h)  acid suppression and diet restrictions/ reviewed and instructions given in writing.    ? Allergy/ asthma component > Prednisone 10 mg take  4 each am x 2 days,   2 each am x 2 days,  1 each am x 2 days and stop  ? Active sinus dz > doubt so rx zpak only  ? A bunch of PE's > unlikely on coumadin   ? chf  > no crackles, no edema > f/u cards not change in rx in meantime  I had an extended discussion with the patient reviewing all relevant studies completed to date and  lasting 25 minutes of a 40  minute extended  acute office visit    Each maintenance medication was reviewed in detail including most importantly the difference between maintenance and prns and under what circumstances the prns are to be triggered using an action plan format that is not reflected in the computer generated alphabetically organized AVS.    Please see instructions for details which were reviewed in writing and the patient given a copy highlighting the part that I personally wrote and discussed at today's ov.

## 2016-05-29 ENCOUNTER — Telehealth: Payer: Self-pay | Admitting: Pulmonary Disease

## 2016-05-29 MED ORDER — LEVOFLOXACIN 500 MG PO TABS: 500.0000 mg | ORAL_TABLET | Freq: Every day | ORAL | Status: DC

## 2016-05-29 NOTE — Telephone Encounter (Signed)
Spoke with [name], states his ongoing sob, wheezing, nonprod cough is not improving.   Denies mucus production, fever, chest pain.  [name] is requesting a levaquin rx.   [name] was seen on 6/6 by MW and given pred taper and zpak, states "zpak never works for him".   [name] is still taking zpak and pred taper. Uses [facility] [employer].    CY please advise on recs.  Thanks!   Allergies  Allergen Reactions  . Acetaminophen-Codeine Hives  . Altace [Ramipril] Other (See Comments)    Other reaction(s): Weal  . Codeine Hives  . Sulfonamide Derivatives Hives  . Sulfa Antibiotics Rash    Other reaction(s): Weal   Current Outpatient Prescriptions on File Prior to Visit  Medication Sig Dispense Refill  . aspirin EC 81 MG tablet Take 1 tablet (81 mg total) by mouth daily.    Marland Kitchen azithromycin (ZITHROMAX) 250 MG tablet Take 2 on day one then 1 daily x 4 days 6 tablet 0  . beta carotene w/minerals (OCUVITE) tablet Take 1 tablet by mouth daily.    . budesonide-formoterol (SYMBICORT) 160-4.5 MCG/ACT inhaler Inhale 2 puffs into the lungs 2 (two) times daily.    . Cholecalciferol (VITAMIN D) 2000 UNITS tablet Take 2,000 Units by mouth daily.    . digoxin (DIGOX) 0.125 MG tablet Take 1 tablet by mouth  daily 90 tablet 3  . HYDROcodone-acetaminophen (NORCO/VICODIN) 5-325 MG tablet Take 1-2 tabs every 4-6 hrs PRN pain    . latanoprost (XALATAN) 0.005 % ophthalmic solution Place 1 drop into both eyes at bedtime.     Marland Kitchen losartan (COZAAR) 100 MG tablet Take 25 mg by mouth daily.     . metFORMIN (GLUCOPHAGE) 500 MG tablet Take 1,000 mg by mouth 2 (two) times daily with a meal.    . metoprolol succinate (TOPROL-XL) 50 MG 24 hr tablet Take 50 mg by mouth daily. Take with or immediately following a meal.    . OXYGEN o2 2 to 3lpm 24/7 [facility]    . predniSONE (DELTASONE) 10 MG tablet Take  4 each am x 2 days,   2 each am x 2 days,  1 each am x 2 days and stop 14 tablet 0  . simvastatin (ZOCOR) 40 MG tablet Take 40 mg by mouth  at bedtime.     Marland Kitchen SPIRIVA RESPIMAT 2.5 MCG/ACT AERS Use 2 puffs daily 12 g 1  . Tamsulosin HCl (FLOMAX) 0.4 MG CAPS Take 0.4 mg by mouth daily.     Marland Kitchen torsemide (DEMADEX) 20 MG tablet Take 40 mg by mouth 2 (two) times daily. As needed    . warfarin (COUMADIN) 2.5 MG tablet Take as directed by  Coumadin Clinic 120 tablet 0  . warfarin (COUMADIN) 2.5 MG tablet Take 2.5-5 mg by mouth daily. Take 2 tablets (5mg ) on Wednesday and Saturday Take 1 tablet (2.5mg ) on Monday, Tuesday, Thursday, Friday and Sunday     No current facility-administered medications on file prior to visit.

## 2016-05-29 NOTE — Telephone Encounter (Signed)
Ok levaquin 500 mg, # 7, 1 daily

## 2016-05-29 NOTE — Telephone Encounter (Signed)
Spoke with the pt and notified of recs per CDY  Rx was sent  Pt has taken this many times and is aware he needs to call the coumadin clinic and tell them he is taking this

## 2016-06-02 ENCOUNTER — Ambulatory Visit (INDEPENDENT_AMBULATORY_CARE_PROVIDER_SITE_OTHER): Payer: Medicare Other

## 2016-06-02 DIAGNOSIS — I059 Rheumatic mitral valve disease, unspecified: Secondary | ICD-10-CM

## 2016-06-02 DIAGNOSIS — Z9889 Other specified postprocedural states: Secondary | ICD-10-CM | POA: Diagnosis not present

## 2016-06-02 DIAGNOSIS — I4891 Unspecified atrial fibrillation: Secondary | ICD-10-CM | POA: Diagnosis not present

## 2016-06-02 DIAGNOSIS — Z7901 Long term (current) use of anticoagulants: Secondary | ICD-10-CM

## 2016-06-02 DIAGNOSIS — Z5181 Encounter for therapeutic drug level monitoring: Secondary | ICD-10-CM

## 2016-06-02 DIAGNOSIS — I482 Chronic atrial fibrillation, unspecified: Secondary | ICD-10-CM

## 2016-06-02 LAB — POCT INR: INR: 4.1

## 2016-06-11 DIAGNOSIS — M62830 Muscle spasm of back: Secondary | ICD-10-CM | POA: Diagnosis not present

## 2016-06-11 DIAGNOSIS — M5136 Other intervertebral disc degeneration, lumbar region: Secondary | ICD-10-CM | POA: Diagnosis not present

## 2016-06-18 ENCOUNTER — Ambulatory Visit (INDEPENDENT_AMBULATORY_CARE_PROVIDER_SITE_OTHER): Payer: Medicare Other | Admitting: *Deleted

## 2016-06-18 DIAGNOSIS — Z5181 Encounter for therapeutic drug level monitoring: Secondary | ICD-10-CM

## 2016-06-18 DIAGNOSIS — Z9889 Other specified postprocedural states: Secondary | ICD-10-CM | POA: Diagnosis not present

## 2016-06-18 DIAGNOSIS — I059 Rheumatic mitral valve disease, unspecified: Secondary | ICD-10-CM

## 2016-06-18 DIAGNOSIS — I4891 Unspecified atrial fibrillation: Secondary | ICD-10-CM | POA: Diagnosis not present

## 2016-06-18 DIAGNOSIS — Z7901 Long term (current) use of anticoagulants: Secondary | ICD-10-CM | POA: Diagnosis not present

## 2016-06-18 DIAGNOSIS — I482 Chronic atrial fibrillation, unspecified: Secondary | ICD-10-CM

## 2016-06-18 LAB — POCT INR: INR: 4.8

## 2016-06-27 ENCOUNTER — Other Ambulatory Visit: Payer: Self-pay | Admitting: Cardiovascular Disease

## 2016-06-27 ENCOUNTER — Ambulatory Visit: Payer: Medicare Other | Admitting: Pulmonary Disease

## 2016-06-27 NOTE — Telephone Encounter (Signed)
Please review for refill, Thank you. 

## 2016-07-01 NOTE — Telephone Encounter (Signed)
Please review for refill. Thanks!  

## 2016-07-02 DIAGNOSIS — M62830 Muscle spasm of back: Secondary | ICD-10-CM | POA: Diagnosis not present

## 2016-07-02 DIAGNOSIS — M5136 Other intervertebral disc degeneration, lumbar region: Secondary | ICD-10-CM | POA: Diagnosis not present

## 2016-07-07 ENCOUNTER — Other Ambulatory Visit: Payer: Self-pay | Admitting: Physical Medicine and Rehabilitation

## 2016-07-07 DIAGNOSIS — M5416 Radiculopathy, lumbar region: Secondary | ICD-10-CM

## 2016-07-09 ENCOUNTER — Ambulatory Visit (INDEPENDENT_AMBULATORY_CARE_PROVIDER_SITE_OTHER): Payer: Medicare Other

## 2016-07-09 DIAGNOSIS — Z7901 Long term (current) use of anticoagulants: Secondary | ICD-10-CM

## 2016-07-09 DIAGNOSIS — I059 Rheumatic mitral valve disease, unspecified: Secondary | ICD-10-CM

## 2016-07-09 DIAGNOSIS — I4891 Unspecified atrial fibrillation: Secondary | ICD-10-CM

## 2016-07-09 DIAGNOSIS — Z5181 Encounter for therapeutic drug level monitoring: Secondary | ICD-10-CM

## 2016-07-09 DIAGNOSIS — Z9889 Other specified postprocedural states: Secondary | ICD-10-CM

## 2016-07-09 DIAGNOSIS — I482 Chronic atrial fibrillation, unspecified: Secondary | ICD-10-CM

## 2016-07-09 LAB — POCT INR: INR: 2.4

## 2016-07-10 ENCOUNTER — Encounter: Payer: Self-pay | Admitting: Pulmonary Disease

## 2016-07-10 ENCOUNTER — Ambulatory Visit (INDEPENDENT_AMBULATORY_CARE_PROVIDER_SITE_OTHER): Payer: Medicare Other | Admitting: Pulmonary Disease

## 2016-07-10 VITALS — BP 104/60 | HR 88 | Ht 66.0 in | Wt 160.8 lb

## 2016-07-10 DIAGNOSIS — J438 Other emphysema: Secondary | ICD-10-CM

## 2016-07-10 DIAGNOSIS — J9611 Chronic respiratory failure with hypoxia: Secondary | ICD-10-CM

## 2016-07-10 MED ORDER — ALBUTEROL SULFATE HFA 108 (90 BASE) MCG/ACT IN AERS: 2.0000 | INHALATION_SPRAY | Freq: Four times a day (QID) | RESPIRATORY_TRACT | Status: DC | PRN

## 2016-07-10 MED ORDER — BUDESONIDE-FORMOTEROL FUMARATE 160-4.5 MCG/ACT IN AERO: 2.0000 | INHALATION_SPRAY | Freq: Two times a day (BID) | RESPIRATORY_TRACT | Status: AC

## 2016-07-10 NOTE — Assessment & Plan Note (Signed)
He had an exacerbation back in June but since then he has been stable.  Plan: Continue Symbicort and Spiriva Continue as needed albuterol.

## 2016-07-10 NOTE — Assessment & Plan Note (Signed)
Continue using 3 L of oxygen 24 hours a day for

## 2016-07-10 NOTE — Progress Notes (Signed)
Subjective:    Patient ID: [name], male    DOB: July 05, 1941, 75 y.o.   MRN: [id number]  Synopsis: [name] was first seen in the [facility] Pulmonary office in 10/2012 for COPD.  He had been admitted to the hospital for bronchitis in months prior.  His spirometry showed clear obstruction and an FEV1 of 48% predicted.  He also has a history of a CABG and Mitral valve replacement.  He has recurrent  HPI   Chief Complaint  Patient presents with  . Follow-up    pt states he's much improved from acute ov last month with MW-states he has no breathing complaints today.     He had a flare of his COPD about 6 weeks ago and now he's doing much better. He said that the prednisone only helped a little bit and he needed to take Levaquin to completely take care of his symptoms.  Since then his breathing has improved, no cough. He is only used his albuterol once in the last 2 months. He continues to use and benefit from his oxygen on a daily basis.  He's been having a lot of back pain recently. He is seeing his physician for this and he said to have an MRI planned for next week.  Past Medical History  Diagnosis Date  . Chronic atrial fibrillation (HCC)     a. warfarin; b. CHADSVASc 6 (CHF, HTN, age x 2, DM, vascular disease) giving him an estimated annual stroke risk of 9.8%  . Hypertension   . Hyperkalemia   . Chronic systolic CHF (congestive heart failure) ([place])     a. echo 10/2014: EF 30-35%, AK of inf & post myocardium, HK of apical & lat wall, ant & antsept wall best preserved, mitral: mechanical prosthesis was present & well seated, LA moderately dilated, RV systolic fxn mildly reduced, PASP nl  . Coronary artery disease     Cath 2005- patent LIMA-LAD, SVG-OM grafts, moderate MR  . Prostate cancer ([place]) 04/2009  . Hyperlipidemia   . COPD (chronic obstructive pulmonary disease) ([place])     URI 9/12  . DM type 2 (diabetes mellitus, type 2) ([place])   . PNA (pneumonia) 03/2010  . Glaucoma    . Hard of hearing   . Myocardial infarction ([place])   . Gout   . Adenomatous polyps   . Diverticulosis     left side   . H/O mitral valve prolapse   . Sigmoid diverticulitis     perforated      Review of Systems  Constitutional: Negative for fever, chills and fatigue.  HENT: Negative for congestion, postnasal drip and rhinorrhea.   Respiratory: Positive for cough, shortness of breath and wheezing. Negative for choking.   Cardiovascular: Negative for chest pain, palpitations and leg swelling.  Gastrointestinal: Negative for nausea, abdominal pain, diarrhea, constipation, abdominal distention and rectal pain.       Objective:   Physical Exam  Filed Vitals:   07/10/16 1025  BP: 104/60  Pulse: 88  Height: 5\' 6"  (1.676 m)  Weight: 160 lb 12.8 oz (72.938 kg)  SpO2: 90%  RA  Gen:  Chronically ill appearing, no acute distress HEENT: NCAT, EOMi, OP clear PULM: Wheezing bilaterally, rhonchi L>R, normal effort CV: Irreg Irreg, mechanical S1, no JVD AB: BS+, soft, nontender, no hsm Ext: warm, no edema, no clubbing, no cyanosis   10/25/2012 CXR >> cardiomegally, no clear infiltrate 10/25/2012 Spirometry >> clear obstruction on flow volume loop, FEV1 48% predicted  11/2012 CT chest >> LLL pneumonia, enlarged mediastinal lymph nodes 02/22/2013 CT chest [facility] >> complete resolution of pneumonia; mediastinal lymphadenopathy persists, 1.7 cm 4R node, 1.0cm 7 node 02/2013 Modified Barium Swallow [facility] >> mild oropharyngeal dysphagia characterized by decreased pharyngeal pressure generation resulting in mild vallecular residue; no tracheal penetration.  Educated by Speech on swallowing technique (sit up, small bites, eat slowly) 08/2013 CT chest > improved mediastinal lymphadenopathy 12/19/2013 > bilateral fluffy infiltrates consistent with pneumonia May 2015 CT chest> no change in chronic mediastinal lymphadenopathy May 2016 CXR > nodularity left lung March 2017 CT chest images showing complete  resolution of the left lung abnormality, mediastinal lymphadenopathy is stable.    Assessment & Plan:   COPD (chronic obstructive pulmonary disease) ([place]) He had an exacerbation back in June but since then he has been stable.  Plan: Continue Symbicort and Spiriva Continue as needed albuterol.  Chronic respiratory failure with hypoxia (HCC) Continue using 3 L of oxygen 24 hours a day for    Updated Medication List Outpatient Encounter Prescriptions as of 07/10/2016  Medication Sig  . aspirin EC 81 MG tablet Take 1 tablet (81 mg total) by mouth daily.  . beta carotene w/minerals (OCUVITE) tablet Take 1 tablet by mouth daily.  . budesonide-formoterol (SYMBICORT) 160-4.5 MCG/ACT inhaler Inhale 2 puffs into the lungs 2 (two) times daily.  . Cholecalciferol (VITAMIN D) 2000 UNITS tablet Take 2,000 Units by mouth daily.  . digoxin (DIGOX) 0.125 MG tablet Take 1 tablet by mouth  daily  . HYDROcodone-acetaminophen (NORCO/VICODIN) 5-325 MG tablet Take 1-2 tabs every 4-6 hrs PRN pain  . latanoprost (XALATAN) 0.005 % ophthalmic solution Place 1 drop into both eyes at bedtime.   Marland Kitchen losartan (COZAAR) 100 MG tablet Take 25 mg by mouth daily.   . metoprolol succinate (TOPROL-XL) 50 MG 24 hr tablet Take 50 mg by mouth daily. Take with or immediately following a meal.  . OXYGEN o2 2 to 3lpm 24/7 [facility]  . simvastatin (ZOCOR) 40 MG tablet Take 40 mg by mouth at bedtime.   Marland Kitchen SPIRIVA RESPIMAT 2.5 MCG/ACT AERS Use 2 puffs daily  . Tamsulosin HCl (FLOMAX) 0.4 MG CAPS Take 0.4 mg by mouth daily.   Marland Kitchen torsemide (DEMADEX) 20 MG tablet Take 40 mg by mouth 2 (two) times daily. As needed  . warfarin (COUMADIN) 2.5 MG tablet Take as directed by  Coumadin Clinic  . [DISCONTINUED] budesonide-formoterol (SYMBICORT) 160-4.5 MCG/ACT inhaler Inhale 2 puffs into the lungs 2 (two) times daily.  Marland Kitchen albuterol (PROVENTIL HFA;VENTOLIN HFA) 108 (90 Base) MCG/ACT inhaler Inhale 2 puffs into the lungs every 6 (six) hours as needed  for wheezing or shortness of breath.  . [DISCONTINUED] metFORMIN (GLUCOPHAGE) 500 MG tablet Take 1,000 mg by mouth 2 (two) times daily with a meal. Reported on 07/10/2016   No facility-administered encounter medications on file as of 07/10/2016.

## 2016-07-10 NOTE — Patient Instructions (Signed)
Keep taking Symbicort and Spiriva daily Keep using her oxygen as you're doing I will see you back in 3-4 months or sooner if needed

## 2016-07-11 DIAGNOSIS — M6281 Muscle weakness (generalized): Secondary | ICD-10-CM | POA: Diagnosis not present

## 2016-07-11 DIAGNOSIS — M62830 Muscle spasm of back: Secondary | ICD-10-CM | POA: Diagnosis not present

## 2016-07-14 ENCOUNTER — Other Ambulatory Visit: Payer: Self-pay

## 2016-07-14 MED ORDER — ALBUTEROL SULFATE HFA 108 (90 BASE) MCG/ACT IN AERS: 2.0000 | INHALATION_SPRAY | Freq: Four times a day (QID) | RESPIRATORY_TRACT | 11 refills | Status: DC | PRN

## 2016-07-16 DIAGNOSIS — M62830 Muscle spasm of back: Secondary | ICD-10-CM | POA: Diagnosis not present

## 2016-07-18 ENCOUNTER — Ambulatory Visit
Admission: RE | Admit: 2016-07-18 | Discharge: 2016-07-18 | Disposition: A | Discharge status: Home / Self Care | Payer: Medicare Other | Source: Ambulatory Visit | Attending: Physical Medicine and Rehabilitation | Admitting: Physical Medicine and Rehabilitation

## 2016-07-18 DIAGNOSIS — M47896 Other spondylosis, lumbar region: Secondary | ICD-10-CM | POA: Diagnosis not present

## 2016-07-18 DIAGNOSIS — M5416 Radiculopathy, lumbar region: Secondary | ICD-10-CM

## 2016-07-18 DIAGNOSIS — N281 Cyst of kidney, acquired: Secondary | ICD-10-CM | POA: Diagnosis not present

## 2016-07-18 DIAGNOSIS — M47816 Spondylosis without myelopathy or radiculopathy, lumbar region: Secondary | ICD-10-CM | POA: Diagnosis not present

## 2016-07-21 ENCOUNTER — Telehealth: Payer: Self-pay | Admitting: Pulmonary Disease

## 2016-07-21 NOTE — Telephone Encounter (Signed)
Spoke with the pt  He states that he received 3 albuterol inhalers and only needed one from mail order pharm  I advised that we always send 90 days supply when sending to mail order pharm  I advised next time he needs a refill, he needs to tell us to send to retail pharm  He verbalized understanding and nothing further needed

## 2016-07-28 DIAGNOSIS — L821 Other seborrheic keratosis: Secondary | ICD-10-CM | POA: Diagnosis not present

## 2016-07-28 DIAGNOSIS — L578 Other skin changes due to chronic exposure to nonionizing radiation: Secondary | ICD-10-CM | POA: Diagnosis not present

## 2016-07-30 ENCOUNTER — Ambulatory Visit (INDEPENDENT_AMBULATORY_CARE_PROVIDER_SITE_OTHER): Payer: Medicare Other

## 2016-07-30 DIAGNOSIS — I4891 Unspecified atrial fibrillation: Secondary | ICD-10-CM | POA: Diagnosis not present

## 2016-07-30 DIAGNOSIS — I059 Rheumatic mitral valve disease, unspecified: Secondary | ICD-10-CM

## 2016-07-30 DIAGNOSIS — Z9889 Other specified postprocedural states: Secondary | ICD-10-CM

## 2016-07-30 DIAGNOSIS — Z7901 Long term (current) use of anticoagulants: Secondary | ICD-10-CM

## 2016-07-30 DIAGNOSIS — Z5181 Encounter for therapeutic drug level monitoring: Secondary | ICD-10-CM

## 2016-07-30 LAB — POCT INR: INR: 2.1

## 2016-08-01 DIAGNOSIS — S61217A Laceration without foreign body of left little finger without damage to nail, initial encounter: Secondary | ICD-10-CM | POA: Diagnosis not present

## 2016-08-06 DIAGNOSIS — R319 Hematuria, unspecified: Secondary | ICD-10-CM | POA: Diagnosis not present

## 2016-08-08 DIAGNOSIS — E113293 Type 2 diabetes mellitus with mild nonproliferative diabetic retinopathy without macular edema, bilateral: Secondary | ICD-10-CM | POA: Diagnosis not present

## 2016-08-20 ENCOUNTER — Ambulatory Visit (INDEPENDENT_AMBULATORY_CARE_PROVIDER_SITE_OTHER): Payer: Medicare Other | Admitting: *Deleted

## 2016-08-20 DIAGNOSIS — I059 Rheumatic mitral valve disease, unspecified: Secondary | ICD-10-CM | POA: Diagnosis not present

## 2016-08-20 DIAGNOSIS — M62830 Muscle spasm of back: Secondary | ICD-10-CM | POA: Diagnosis not present

## 2016-08-20 DIAGNOSIS — Z5181 Encounter for therapeutic drug level monitoring: Secondary | ICD-10-CM | POA: Diagnosis not present

## 2016-08-20 DIAGNOSIS — Z9889 Other specified postprocedural states: Secondary | ICD-10-CM

## 2016-08-20 DIAGNOSIS — M5136 Other intervertebral disc degeneration, lumbar region: Secondary | ICD-10-CM | POA: Diagnosis not present

## 2016-08-20 LAB — POCT INR: INR: 2.6

## 2016-09-04 DIAGNOSIS — M5136 Other intervertebral disc degeneration, lumbar region: Secondary | ICD-10-CM | POA: Diagnosis not present

## 2016-09-04 DIAGNOSIS — Z794 Long term (current) use of insulin: Secondary | ICD-10-CM | POA: Diagnosis not present

## 2016-09-04 DIAGNOSIS — M62830 Muscle spasm of back: Secondary | ICD-10-CM | POA: Diagnosis not present

## 2016-09-04 DIAGNOSIS — M6281 Muscle weakness (generalized): Secondary | ICD-10-CM | POA: Diagnosis not present

## 2016-09-04 DIAGNOSIS — E119 Type 2 diabetes mellitus without complications: Secondary | ICD-10-CM | POA: Diagnosis not present

## 2016-09-04 DIAGNOSIS — I5022 Chronic systolic (congestive) heart failure: Secondary | ICD-10-CM | POA: Diagnosis not present

## 2016-09-04 DIAGNOSIS — R0600 Dyspnea, unspecified: Secondary | ICD-10-CM | POA: Diagnosis not present

## 2016-09-08 DIAGNOSIS — E1121 Type 2 diabetes mellitus with diabetic nephropathy: Secondary | ICD-10-CM | POA: Diagnosis not present

## 2016-09-08 DIAGNOSIS — M62830 Muscle spasm of back: Secondary | ICD-10-CM | POA: Diagnosis not present

## 2016-09-10 ENCOUNTER — Telehealth: Payer: Self-pay | Admitting: Cardiovascular Disease

## 2016-09-10 NOTE — Telephone Encounter (Signed)
Pt states he was put on another diabetic medication yesterday, Tradjenta 5mg , and wanted to let us know

## 2016-09-10 NOTE — Telephone Encounter (Signed)
Called pt back to let him know that there is no interaction between [place] and his Coumadin. He expressed understanding.

## 2016-09-12 DIAGNOSIS — M62830 Muscle spasm of back: Secondary | ICD-10-CM | POA: Diagnosis not present

## 2016-09-15 DIAGNOSIS — M62830 Muscle spasm of back: Secondary | ICD-10-CM | POA: Diagnosis not present

## 2016-09-17 ENCOUNTER — Ambulatory Visit (INDEPENDENT_AMBULATORY_CARE_PROVIDER_SITE_OTHER): Payer: Medicare Other

## 2016-09-17 DIAGNOSIS — Z5181 Encounter for therapeutic drug level monitoring: Secondary | ICD-10-CM

## 2016-09-17 DIAGNOSIS — I059 Rheumatic mitral valve disease, unspecified: Secondary | ICD-10-CM

## 2016-09-17 DIAGNOSIS — I4891 Unspecified atrial fibrillation: Secondary | ICD-10-CM | POA: Diagnosis not present

## 2016-09-17 DIAGNOSIS — Z9889 Other specified postprocedural states: Secondary | ICD-10-CM

## 2016-09-17 LAB — POCT INR: INR: 3.1

## 2016-09-18 DIAGNOSIS — M62830 Muscle spasm of back: Secondary | ICD-10-CM | POA: Diagnosis not present

## 2016-09-29 DIAGNOSIS — M62830 Muscle spasm of back: Secondary | ICD-10-CM | POA: Diagnosis not present

## 2016-10-02 DIAGNOSIS — M62830 Muscle spasm of back: Secondary | ICD-10-CM | POA: Diagnosis not present

## 2016-10-03 DIAGNOSIS — Z23 Encounter for immunization: Secondary | ICD-10-CM | POA: Diagnosis not present

## 2016-10-06 ENCOUNTER — Other Ambulatory Visit: Payer: Self-pay | Admitting: Pulmonary Disease

## 2016-10-15 ENCOUNTER — Ambulatory Visit (INDEPENDENT_AMBULATORY_CARE_PROVIDER_SITE_OTHER): Payer: Medicare Other

## 2016-10-15 DIAGNOSIS — M5416 Radiculopathy, lumbar region: Secondary | ICD-10-CM | POA: Diagnosis not present

## 2016-10-15 DIAGNOSIS — I059 Rheumatic mitral valve disease, unspecified: Secondary | ICD-10-CM | POA: Diagnosis not present

## 2016-10-15 DIAGNOSIS — Z5181 Encounter for therapeutic drug level monitoring: Secondary | ICD-10-CM | POA: Diagnosis not present

## 2016-10-15 DIAGNOSIS — I4891 Unspecified atrial fibrillation: Secondary | ICD-10-CM

## 2016-10-15 DIAGNOSIS — Z9889 Other specified postprocedural states: Secondary | ICD-10-CM | POA: Diagnosis not present

## 2016-10-15 DIAGNOSIS — M5136 Other intervertebral disc degeneration, lumbar region: Secondary | ICD-10-CM | POA: Diagnosis not present

## 2016-10-15 DIAGNOSIS — M62830 Muscle spasm of back: Secondary | ICD-10-CM | POA: Diagnosis not present

## 2016-10-15 LAB — POCT INR: INR: 4.4

## 2016-10-23 DIAGNOSIS — L03031 Cellulitis of right toe: Secondary | ICD-10-CM | POA: Diagnosis not present

## 2016-10-23 DIAGNOSIS — E119 Type 2 diabetes mellitus without complications: Secondary | ICD-10-CM | POA: Diagnosis not present

## 2016-10-23 DIAGNOSIS — B351 Tinea unguium: Secondary | ICD-10-CM | POA: Diagnosis not present

## 2016-10-23 DIAGNOSIS — L600 Ingrowing nail: Secondary | ICD-10-CM | POA: Diagnosis not present

## 2016-11-04 ENCOUNTER — Other Ambulatory Visit: Payer: Self-pay | Admitting: Cardiovascular Disease

## 2016-11-04 NOTE — Telephone Encounter (Signed)
Please review for refill. Thanks!  

## 2016-11-05 ENCOUNTER — Ambulatory Visit (INDEPENDENT_AMBULATORY_CARE_PROVIDER_SITE_OTHER): Payer: Medicare Other

## 2016-11-05 DIAGNOSIS — I059 Rheumatic mitral valve disease, unspecified: Secondary | ICD-10-CM | POA: Diagnosis not present

## 2016-11-05 DIAGNOSIS — Z5181 Encounter for therapeutic drug level monitoring: Secondary | ICD-10-CM

## 2016-11-05 DIAGNOSIS — Z9889 Other specified postprocedural states: Secondary | ICD-10-CM

## 2016-11-05 DIAGNOSIS — I4891 Unspecified atrial fibrillation: Secondary | ICD-10-CM | POA: Diagnosis not present

## 2016-11-05 LAB — POCT INR: INR: 4

## 2016-11-12 DIAGNOSIS — E119 Type 2 diabetes mellitus without complications: Secondary | ICD-10-CM | POA: Diagnosis not present

## 2016-11-12 DIAGNOSIS — Z794 Long term (current) use of insulin: Secondary | ICD-10-CM | POA: Diagnosis not present

## 2016-11-20 ENCOUNTER — Ambulatory Visit (INDEPENDENT_AMBULATORY_CARE_PROVIDER_SITE_OTHER): Payer: Medicare Other | Admitting: Pulmonary Disease

## 2016-11-20 ENCOUNTER — Encounter: Payer: Self-pay | Admitting: Pulmonary Disease

## 2016-11-20 DIAGNOSIS — J431 Panlobular emphysema: Secondary | ICD-10-CM | POA: Diagnosis not present

## 2016-11-20 DIAGNOSIS — I5022 Chronic systolic (congestive) heart failure: Secondary | ICD-10-CM | POA: Diagnosis not present

## 2016-11-20 MED ORDER — TIOTROPIUM BROMIDE MONOHYDRATE 2.5 MCG/ACT IN AERS: 2.0000 | INHALATION_SPRAY | Freq: Every day | RESPIRATORY_TRACT | 0 refills | Status: AC

## 2016-11-20 MED ORDER — LEVOFLOXACIN 750 MG PO TABS: 750.0000 mg | ORAL_TABLET | Freq: Every day | ORAL | 0 refills | Status: DC

## 2016-11-20 MED ORDER — PREDNISONE 20 MG PO TABS: 20.0000 mg | ORAL_TABLET | Freq: Every day | ORAL | 0 refills | Status: DC

## 2016-11-20 MED ORDER — BUDESONIDE-FORMOTEROL FUMARATE 160-4.5 MCG/ACT IN AERO: 2.0000 | INHALATION_SPRAY | Freq: Two times a day (BID) | RESPIRATORY_TRACT | 0 refills | Status: AC

## 2016-11-20 NOTE — Assessment & Plan Note (Addendum)
[name] is having worsening of his symptoms from his severe COPD after a viral exposure a week ago. In general it's been a stable interval for him prior to this.  He needs to have a Pneumovax vaccine at some point.  Plan: Prednisone 20 mg 5 days If no improvement after a couple days of prednisone take Levaquin, written prescription provided Symbicort to continue Spriva to continue As needed albuterol to continue Follow-up 3 months or sooner if needed

## 2016-11-20 NOTE — Progress Notes (Signed)
Subjective:    Patient ID: [name], male    DOB: [birth year], 75 y.o.   MRN: [id number]  Synopsis: [name] was first seen in the [facility] Pulmonary office in 10/2012 for COPD.  He had been admitted to the hospital for bronchitis in months prior.  His spirometry showed clear obstruction and an FEV1 of 48% predicted.  He also has a history of a CABG and Mitral valve replacement.  He has recurrent aspiration pneumonia in the left lower lobe as well.    HPI   Chief Complaint  Patient presents with  . Follow-up    pt c/o sinus congestion, PND, sob, prod cough with yellow/white mucus Xfew days.     [name] thinks he is coming down with something because his wife has been sick lately with sinus symptoms and a cough. He says that for the last two days he has coughed up mucus and he has some drainage from his sinuses.  He says that it hasn't gotten too bad.  No fever or chills.  No shortness of breath.  He is still using his oxygen all the time.  He is still taking the Spiriva and Symbicort.    He has an appointment with [facility] in December.  No leg swelling.  His diuretics are working well.    Past Medical History:  Diagnosis Date  . Adenomatous polyps   . Chronic atrial fibrillation (HCC)    a. warfarin; b. CHADSVASc 6 (CHF, HTN, age x 2, DM, vascular disease) giving him an estimated annual stroke risk of 9.8%  . Chronic systolic CHF (congestive heart failure) ([place])    a. echo 10/2014: EF 30-35%, AK of inf & post myocardium, HK of apical & lat wall, ant & antsept wall best preserved, mitral: mechanical prosthesis was present & well seated, LA moderately dilated, RV systolic fxn mildly reduced, PASP nl  . COPD (chronic obstructive pulmonary disease) ([place])    URI 9/12  . Coronary artery disease    Cath 2005- patent LIMA-LAD, SVG-OM grafts, moderate MR  . Diverticulosis    left side   . DM type 2 (diabetes mellitus, type 2) ([place])   . Glaucoma   . Gout   . H/O mitral valve  prolapse   . Hard of hearing   . Hyperkalemia   . Hyperlipidemia   . Hypertension   . Myocardial infarction   . PNA (pneumonia) 03/2010  . Prostate cancer ([place]) 04/2009  . Sigmoid diverticulitis    perforated      Review of Systems  Constitutional: Negative for chills, fatigue and fever.  HENT: Negative for congestion, postnasal drip and rhinorrhea.   Respiratory: Positive for cough, shortness of breath and wheezing. Negative for choking.   Cardiovascular: Negative for chest pain, palpitations and leg swelling.  Gastrointestinal: Negative for abdominal distention, abdominal pain, constipation, diarrhea, nausea and rectal pain.       Objective:   Physical Exam  Vitals:   11/20/16 1038  BP: 126/64  BP Location: Left Arm  Cuff Size: Normal  Pulse: 74  SpO2: 93%  Weight: 161 lb (73 kg)  Height: 5\' 6"  (1.676 m)  3L  Gen:  Chronically ill appearing, no acute distress HEENT: NCAT, EOMi, OP clear PULM: few crackles left lower lobe CV: Irreg Irreg, mechanical S1, no JVD AB: BS+, soft, nontender, no hsm Ext: warm, no edema, no clubbing, no cyanosis   10/25/2012 CXR >> cardiomegally, no clear infiltrate 10/25/2012 Spirometry >> clear obstruction on  flow volume loop, FEV1 48% predicted 11/2012 CT chest >> LLL pneumonia, enlarged mediastinal lymph nodes 02/22/2013 CT chest [facility] >> complete resolution of pneumonia; mediastinal lymphadenopathy persists, 1.7 cm 4R node, 1.0cm 7 node 02/2013 Modified Barium Swallow [facility] >> mild oropharyngeal dysphagia characterized by decreased pharyngeal pressure generation resulting in mild vallecular residue; no tracheal penetration.  Educated by Speech on swallowing technique (sit up, small bites, eat slowly) 08/2013 CT chest > improved mediastinal lymphadenopathy 12/19/2013 > bilateral fluffy infiltrates consistent with pneumonia May 2015 CT chest> no change in chronic mediastinal lymphadenopathy May 2016 CXR > nodularity left lung March 2017 CT  chest images showing complete resolution of the left lung abnormality, mediastinal lymphadenopathy is stable.    Assessment & Plan:   COPD (chronic obstructive pulmonary disease) ([place]) [name] is having worsening of his symptoms from his severe COPD after a viral exposure a week ago. In general it's been a stable interval for him prior to this.  He needs to have a Pneumovax vaccine at some point.  Plan: Prednisone 20 mg 5 days If no improvement after a couple days of prednisone take Levaquin, written prescription provided Symbicort to continue Spriva to continue As needed albuterol to continue Follow-up 3 months or sooner if needed  Chronic respiratory failure with hypoxia (HCC) Continue 3 L nasal cannula continuously  Chronic systolic heart failure Euvolemic on exam. Continue current diuretic regimen.   Updated Medication List Outpatient Encounter Prescriptions as of 11/20/2016  Medication Sig Dispense Refill  . albuterol (PROVENTIL HFA;VENTOLIN HFA) 108 (90 Base) MCG/ACT inhaler Inhale 2 puffs into the lungs every 6 (six) hours as needed for wheezing or shortness of breath. 1 Inhaler 11  . aspirin EC 81 MG tablet Take 1 tablet (81 mg total) by mouth daily.    . beta carotene w/minerals (OCUVITE) tablet Take 1 tablet by mouth daily.    . budesonide-formoterol (SYMBICORT) 160-4.5 MCG/ACT inhaler Inhale 2 puffs into the lungs 2 (two) times daily. 1 Inhaler 11  . Cholecalciferol (VITAMIN D) 2000 UNITS tablet Take 2,000 Units by mouth daily.    . digoxin (DIGOX) 0.125 MG tablet Take 1 tablet by mouth  daily 90 tablet 3  . HYDROcodone-acetaminophen (NORCO/VICODIN) 5-325 MG tablet Take 1-2 tabs every 4-6 hrs PRN pain    . latanoprost (XALATAN) 0.005 % ophthalmic solution Place 1 drop into both eyes at bedtime.     Marland Kitchen losartan (COZAAR) 100 MG tablet Take 25 mg by mouth daily.     . metoprolol succinate (TOPROL-XL) 50 MG 24 hr tablet Take 50 mg by mouth daily. Take with or immediately  following a meal.    . OXYGEN o2 2 to 3lpm 24/7 AHC    . simvastatin (ZOCOR) 40 MG tablet Take 40 mg by mouth at bedtime.     Marland Kitchen SPIRIVA RESPIMAT 2.5 MCG/ACT AERS USE 2 PUFFS DAILY 12 g 1  . Tamsulosin HCl (FLOMAX) 0.4 MG CAPS Take 0.4 mg by mouth daily.     Marland Kitchen torsemide (DEMADEX) 20 MG tablet Take 40 mg by mouth 2 (two) times daily. As needed    . warfarin (COUMADIN) 2.5 MG tablet TAKE AS DIRECTED BY  COUMADIN CLINIC 120 tablet 0  . budesonide-formoterol (SYMBICORT) 160-4.5 MCG/ACT inhaler Inhale 2 puffs into the lungs 2 (two) times daily. 1 Inhaler 0  . levofloxacin (LEVAQUIN) 750 MG tablet Take 1 tablet (750 mg total) by mouth daily. 5 tablet 0  . predniSONE (DELTASONE) 20 MG tablet Take 1 tablet (20 mg total)  by mouth daily with breakfast. 5 tablet 0  . Tiotropium Bromide Monohydrate (SPIRIVA RESPIMAT) 2.5 MCG/ACT AERS Inhale 2 puffs into the lungs daily. 2 Inhaler 0   No facility-administered encounter medications on file as of 11/20/2016.

## 2016-11-20 NOTE — Patient Instructions (Signed)
We will give you a pneumonia shot on the next visit Take the prednisone as prescribed If you're not feeling better after the prednisone or if you get worse take the Levaquin I prescribed Keep using your oxygen as you're doing Keep using your Symbicort and Spiriva as you're doing I'll see you back in 3 months

## 2016-11-20 NOTE — Assessment & Plan Note (Signed)
Continue 3 L nasal cannula continuously

## 2016-11-20 NOTE — Assessment & Plan Note (Signed)
Euvolemic on exam. Continue current diuretic regimen.

## 2016-11-27 ENCOUNTER — Telehealth: Payer: Self-pay | Admitting: Pulmonary Disease

## 2016-11-27 MED ORDER — LEVOFLOXACIN 750 MG PO TABS: 750.0000 mg | ORAL_TABLET | Freq: Every day | ORAL | 0 refills | Status: DC

## 2016-11-27 MED ORDER — PREDNISONE 10 MG PO TABS: ORAL_TABLET | ORAL | 0 refills | Status: DC

## 2016-11-27 NOTE — Telephone Encounter (Signed)
Spoke with pt. States that he is not feeling any better since seeing BQ last week. Reports cough and wheezing at night time. Cough is producing yellow mucus. Denies chest tightness, SOB or fever. Took Levaquin and Prednisone that were prescribed with minimal relief. Pt would like BQ's recommendations.  BQ - please advise. Thanks.

## 2016-11-27 NOTE — Telephone Encounter (Signed)
Spoke with pt. He is aware of BQ's recommendations. Rx has been sent in. OV has been made for tomorrow with SG at 3pm. Advised pt that if he was feeling better tomorrow he could call and cancel this appointment. He verbalized understanding.

## 2016-11-27 NOTE — Telephone Encounter (Signed)
Levaquin 750mg  po daily x 5 days Prednisone: Take 40mg  po daily for 3 days, then take 30mg  po daily for 3 days, then take 20mg  po daily for two days, then take 10mg  po daily for 2 days  Please let him know I am concerned about him.  If this doesn't clear up by tomorrow he needs to be seen because I'm afraid he may end up in the hospital.

## 2016-11-28 ENCOUNTER — Ambulatory Visit: Payer: Medicare Other | Admitting: Acute Care

## 2016-12-03 ENCOUNTER — Ambulatory Visit (INDEPENDENT_AMBULATORY_CARE_PROVIDER_SITE_OTHER): Payer: Medicare Other | Admitting: *Deleted

## 2016-12-03 DIAGNOSIS — I4891 Unspecified atrial fibrillation: Secondary | ICD-10-CM | POA: Diagnosis not present

## 2016-12-03 DIAGNOSIS — Z9889 Other specified postprocedural states: Secondary | ICD-10-CM | POA: Diagnosis not present

## 2016-12-03 DIAGNOSIS — I059 Rheumatic mitral valve disease, unspecified: Secondary | ICD-10-CM | POA: Diagnosis not present

## 2016-12-03 DIAGNOSIS — E1165 Type 2 diabetes mellitus with hyperglycemia: Secondary | ICD-10-CM | POA: Diagnosis not present

## 2016-12-03 DIAGNOSIS — Z5181 Encounter for therapeutic drug level monitoring: Secondary | ICD-10-CM | POA: Diagnosis not present

## 2016-12-03 LAB — POCT INR: INR: 3

## 2016-12-04 DIAGNOSIS — E119 Type 2 diabetes mellitus without complications: Secondary | ICD-10-CM | POA: Diagnosis not present

## 2016-12-04 DIAGNOSIS — Z794 Long term (current) use of insulin: Secondary | ICD-10-CM | POA: Diagnosis not present

## 2016-12-04 DIAGNOSIS — R0600 Dyspnea, unspecified: Secondary | ICD-10-CM | POA: Diagnosis not present

## 2016-12-04 DIAGNOSIS — I482 Chronic atrial fibrillation: Secondary | ICD-10-CM | POA: Diagnosis not present

## 2016-12-04 DIAGNOSIS — I5022 Chronic systolic (congestive) heart failure: Secondary | ICD-10-CM | POA: Diagnosis not present

## 2016-12-08 DIAGNOSIS — R300 Dysuria: Secondary | ICD-10-CM | POA: Diagnosis not present

## 2016-12-10 DIAGNOSIS — B029 Zoster without complications: Secondary | ICD-10-CM | POA: Diagnosis not present

## 2016-12-23 ENCOUNTER — Other Ambulatory Visit: Payer: Self-pay | Admitting: Cardiovascular Disease

## 2016-12-24 NOTE — Telephone Encounter (Signed)
Pt requesting refill Digoxin , I see pt has [facility] cardiologist as well. Please advise if ok to refill?

## 2016-12-26 ENCOUNTER — Telehealth: Payer: Self-pay | Admitting: Pulmonary Disease

## 2016-12-26 ENCOUNTER — Other Ambulatory Visit: Payer: Self-pay

## 2016-12-26 ENCOUNTER — Other Ambulatory Visit
Admission: RE | Admit: 2016-12-26 | Discharge: 2016-12-26 | Disposition: A | Discharge status: Home / Self Care | Payer: Medicare Other | Source: Ambulatory Visit | Attending: Internal Medicine | Admitting: Internal Medicine

## 2016-12-26 DIAGNOSIS — I5022 Chronic systolic (congestive) heart failure: Secondary | ICD-10-CM | POA: Diagnosis not present

## 2016-12-26 DIAGNOSIS — R0600 Dyspnea, unspecified: Secondary | ICD-10-CM | POA: Diagnosis not present

## 2016-12-26 LAB — BASIC METABOLIC PANEL
ANION GAP: 9 (ref 5–15)
BUN: 24 mg/dL — ABNORMAL HIGH (ref 6–20)
CHLORIDE: 103 mmol/L (ref 101–111)
CO2: 27 mmol/L (ref 22–32)
Calcium: 9.1 mg/dL (ref 8.9–10.3)
Creatinine, Ser: 1.53 mg/dL — ABNORMAL HIGH (ref 0.61–1.24)
GFR calc Af Amer: 49 mL/min — ABNORMAL LOW (ref >60)
GFR calc non Af Amer: 42 mL/min — ABNORMAL LOW (ref >60)
GLUCOSE: 162 mg/dL — AB (ref 65–99)
POTASSIUM: 4.3 mmol/L (ref 3.5–5.1)
Sodium: 139 mmol/L (ref 135–145)

## 2016-12-26 LAB — BRAIN NATRIURETIC PEPTIDE: B Natriuretic Peptide: 2362 pg/mL — ABNORMAL HIGH (ref 0.0–100.0)

## 2016-12-26 MED ORDER — PREDNISONE 10 MG PO TABS: ORAL_TABLET | ORAL | 0 refills | Status: DC

## 2016-12-26 MED ORDER — LEVOFLOXACIN 750 MG PO TABS: 750.0000 mg | ORAL_TABLET | Freq: Every day | ORAL | 0 refills | Status: DC

## 2016-12-26 NOTE — Telephone Encounter (Signed)
Levaquin 750 mg po daily x 5 days Prednisone: Take 40mg  po daily for 3 days, then take 30mg  po daily for 3 days, then take 20mg  po daily for two days, then take 10mg  po daily for 2 days  Call if no improvement or see MD if worse

## 2016-12-26 NOTE — Telephone Encounter (Signed)
Spoke with pt. And informed him of BQ's recc.  The pt. Agreed to the treatment option. The rx was sent to his pharmacy of choice. He had no further questions. Nothing further is needed at this time.

## 2016-12-26 NOTE — Telephone Encounter (Signed)
Called patient, he is now seeing a heart Provider at [facility]

## 2016-12-26 NOTE — Telephone Encounter (Signed)
Patient needs a follow up appointment with Dr. Rockey Situ before refill, last seen in 2016.  [facility] sent a refill request for Digoxin 0.125 mg one tablet by mouth daily.

## 2016-12-26 NOTE — Telephone Encounter (Signed)
BQ  Please Advise- Sick Message  Pt. Called in c/o of wheezing, coughing up white mucus,  Denies chest tightness,fever, no increase sob. Wanted to know if something could be called in.   Allergies  Allergen Reactions  . Acetaminophen-Codeine Hives  . Altace [Ramipril] Other (See Comments)    Other reaction(s): Weal  . Codeine Hives  . Sulfonamide Derivatives Hives  . Sulfa Antibiotics Rash    Other reaction(s): Weal

## 2016-12-31 ENCOUNTER — Ambulatory Visit (INDEPENDENT_AMBULATORY_CARE_PROVIDER_SITE_OTHER): Payer: Medicare Other

## 2016-12-31 DIAGNOSIS — Z5181 Encounter for therapeutic drug level monitoring: Secondary | ICD-10-CM

## 2016-12-31 DIAGNOSIS — I4891 Unspecified atrial fibrillation: Secondary | ICD-10-CM

## 2016-12-31 DIAGNOSIS — Z9889 Other specified postprocedural states: Secondary | ICD-10-CM

## 2016-12-31 DIAGNOSIS — I059 Rheumatic mitral valve disease, unspecified: Secondary | ICD-10-CM

## 2016-12-31 LAB — POCT INR: INR: 3

## 2017-01-05 DIAGNOSIS — E119 Type 2 diabetes mellitus without complications: Secondary | ICD-10-CM | POA: Diagnosis not present

## 2017-01-05 DIAGNOSIS — M10072 Idiopathic gout, left ankle and foot: Secondary | ICD-10-CM | POA: Diagnosis not present

## 2017-01-05 DIAGNOSIS — H401132 Primary open-angle glaucoma, bilateral, moderate stage: Secondary | ICD-10-CM | POA: Diagnosis not present

## 2017-01-05 DIAGNOSIS — B351 Tinea unguium: Secondary | ICD-10-CM | POA: Diagnosis not present

## 2017-01-09 DIAGNOSIS — H353222 Exudative age-related macular degeneration, left eye, with inactive choroidal neovascularization: Secondary | ICD-10-CM | POA: Diagnosis not present

## 2017-01-18 DIAGNOSIS — R6889 Other general symptoms and signs: Secondary | ICD-10-CM | POA: Diagnosis not present

## 2017-01-18 DIAGNOSIS — R531 Weakness: Secondary | ICD-10-CM | POA: Diagnosis not present

## 2017-01-19 DIAGNOSIS — I2510 Atherosclerotic heart disease of native coronary artery without angina pectoris: Secondary | ICD-10-CM | POA: Diagnosis not present

## 2017-01-19 DIAGNOSIS — D696 Thrombocytopenia, unspecified: Secondary | ICD-10-CM | POA: Diagnosis not present

## 2017-01-19 DIAGNOSIS — I10 Essential (primary) hypertension: Secondary | ICD-10-CM | POA: Diagnosis not present

## 2017-01-19 DIAGNOSIS — R55 Syncope and collapse: Secondary | ICD-10-CM | POA: Diagnosis not present

## 2017-01-19 DIAGNOSIS — Z7951 Long term (current) use of inhaled steroids: Secondary | ICD-10-CM | POA: Diagnosis not present

## 2017-01-19 DIAGNOSIS — J101 Influenza due to other identified influenza virus with other respiratory manifestations: Secondary | ICD-10-CM | POA: Diagnosis not present

## 2017-01-19 DIAGNOSIS — I130 Hypertensive heart and chronic kidney disease with heart failure and stage 1 through stage 4 chronic kidney disease, or unspecified chronic kidney disease: Secondary | ICD-10-CM | POA: Diagnosis present

## 2017-01-19 DIAGNOSIS — I454 Nonspecific intraventricular block: Secondary | ICD-10-CM | POA: Diagnosis present

## 2017-01-19 DIAGNOSIS — M109 Gout, unspecified: Secondary | ICD-10-CM | POA: Diagnosis present

## 2017-01-19 DIAGNOSIS — D649 Anemia, unspecified: Secondary | ICD-10-CM | POA: Diagnosis present

## 2017-01-19 DIAGNOSIS — J441 Chronic obstructive pulmonary disease with (acute) exacerbation: Secondary | ICD-10-CM | POA: Diagnosis present

## 2017-01-19 DIAGNOSIS — R531 Weakness: Secondary | ICD-10-CM | POA: Diagnosis not present

## 2017-01-19 DIAGNOSIS — Z7901 Long term (current) use of anticoagulants: Secondary | ICD-10-CM | POA: Diagnosis not present

## 2017-01-19 DIAGNOSIS — E1122 Type 2 diabetes mellitus with diabetic chronic kidney disease: Secondary | ICD-10-CM | POA: Diagnosis not present

## 2017-01-19 DIAGNOSIS — Z8546 Personal history of malignant neoplasm of prostate: Secondary | ICD-10-CM | POA: Diagnosis not present

## 2017-01-19 DIAGNOSIS — Z87891 Personal history of nicotine dependence: Secondary | ICD-10-CM | POA: Diagnosis not present

## 2017-01-19 DIAGNOSIS — H409 Unspecified glaucoma: Secondary | ICD-10-CM | POA: Diagnosis present

## 2017-01-19 DIAGNOSIS — Z7982 Long term (current) use of aspirin: Secondary | ICD-10-CM | POA: Diagnosis not present

## 2017-01-19 DIAGNOSIS — I482 Chronic atrial fibrillation: Secondary | ICD-10-CM | POA: Diagnosis not present

## 2017-01-19 DIAGNOSIS — E7800 Pure hypercholesterolemia, unspecified: Secondary | ICD-10-CM | POA: Diagnosis present

## 2017-01-19 DIAGNOSIS — J440 Chronic obstructive pulmonary disease with acute lower respiratory infection: Secondary | ICD-10-CM | POA: Diagnosis present

## 2017-01-19 DIAGNOSIS — I5023 Acute on chronic systolic (congestive) heart failure: Secondary | ICD-10-CM | POA: Diagnosis not present

## 2017-01-19 DIAGNOSIS — I471 Supraventricular tachycardia: Secondary | ICD-10-CM | POA: Diagnosis not present

## 2017-01-19 DIAGNOSIS — Z794 Long term (current) use of insulin: Secondary | ICD-10-CM | POA: Diagnosis not present

## 2017-01-19 DIAGNOSIS — J9621 Acute and chronic respiratory failure with hypoxia: Secondary | ICD-10-CM | POA: Diagnosis present

## 2017-01-19 DIAGNOSIS — R29898 Other symptoms and signs involving the musculoskeletal system: Secondary | ICD-10-CM | POA: Diagnosis not present

## 2017-01-19 DIAGNOSIS — N183 Chronic kidney disease, stage 3 (moderate): Secondary | ICD-10-CM | POA: Diagnosis present

## 2017-01-19 DIAGNOSIS — I5022 Chronic systolic (congestive) heart failure: Secondary | ICD-10-CM | POA: Diagnosis not present

## 2017-01-19 DIAGNOSIS — I517 Cardiomegaly: Secondary | ICD-10-CM | POA: Diagnosis not present

## 2017-01-19 DIAGNOSIS — Z8709 Personal history of other diseases of the respiratory system: Secondary | ICD-10-CM | POA: Diagnosis not present

## 2017-01-19 DIAGNOSIS — M10072 Idiopathic gout, left ankle and foot: Secondary | ICD-10-CM | POA: Diagnosis not present

## 2017-01-19 DIAGNOSIS — I4891 Unspecified atrial fibrillation: Secondary | ICD-10-CM | POA: Diagnosis not present

## 2017-01-19 DIAGNOSIS — Z23 Encounter for immunization: Secondary | ICD-10-CM | POA: Diagnosis not present

## 2017-01-19 DIAGNOSIS — R791 Abnormal coagulation profile: Secondary | ICD-10-CM | POA: Diagnosis present

## 2017-01-26 ENCOUNTER — Ambulatory Visit (INDEPENDENT_AMBULATORY_CARE_PROVIDER_SITE_OTHER): Payer: Medicare Other | Admitting: *Deleted

## 2017-01-26 DIAGNOSIS — Z5181 Encounter for therapeutic drug level monitoring: Secondary | ICD-10-CM

## 2017-01-26 DIAGNOSIS — Z9889 Other specified postprocedural states: Secondary | ICD-10-CM

## 2017-01-26 DIAGNOSIS — I4891 Unspecified atrial fibrillation: Secondary | ICD-10-CM

## 2017-01-26 DIAGNOSIS — I059 Rheumatic mitral valve disease, unspecified: Secondary | ICD-10-CM

## 2017-01-26 LAB — POCT INR: INR: 2.9

## 2017-01-28 DIAGNOSIS — I5022 Chronic systolic (congestive) heart failure: Secondary | ICD-10-CM | POA: Diagnosis not present

## 2017-01-28 DIAGNOSIS — R0602 Shortness of breath: Secondary | ICD-10-CM | POA: Diagnosis not present

## 2017-01-28 DIAGNOSIS — Z952 Presence of prosthetic heart valve: Secondary | ICD-10-CM | POA: Diagnosis not present

## 2017-01-28 DIAGNOSIS — E1165 Type 2 diabetes mellitus with hyperglycemia: Secondary | ICD-10-CM | POA: Diagnosis not present

## 2017-01-28 DIAGNOSIS — J101 Influenza due to other identified influenza virus with other respiratory manifestations: Secondary | ICD-10-CM | POA: Diagnosis not present

## 2017-02-04 ENCOUNTER — Ambulatory Visit (INDEPENDENT_AMBULATORY_CARE_PROVIDER_SITE_OTHER): Payer: Medicare Other

## 2017-02-04 DIAGNOSIS — I4891 Unspecified atrial fibrillation: Secondary | ICD-10-CM

## 2017-02-04 DIAGNOSIS — Z5181 Encounter for therapeutic drug level monitoring: Secondary | ICD-10-CM | POA: Diagnosis not present

## 2017-02-04 DIAGNOSIS — Z9889 Other specified postprocedural states: Secondary | ICD-10-CM

## 2017-02-04 DIAGNOSIS — I059 Rheumatic mitral valve disease, unspecified: Secondary | ICD-10-CM

## 2017-02-04 LAB — POCT INR: INR: 3.3

## 2017-02-10 ENCOUNTER — Other Ambulatory Visit
Admission: RE | Admit: 2017-02-10 | Discharge: 2017-02-10 | Disposition: A | Discharge status: Home / Self Care | Payer: Medicare Other | Source: Ambulatory Visit | Attending: Internal Medicine | Admitting: Internal Medicine

## 2017-02-10 DIAGNOSIS — Z794 Long term (current) use of insulin: Secondary | ICD-10-CM | POA: Diagnosis not present

## 2017-02-10 DIAGNOSIS — I10 Essential (primary) hypertension: Secondary | ICD-10-CM | POA: Insufficient documentation

## 2017-02-10 DIAGNOSIS — N183 Chronic kidney disease, stage 3 (moderate): Secondary | ICD-10-CM | POA: Diagnosis not present

## 2017-02-10 DIAGNOSIS — Z79899 Other long term (current) drug therapy: Secondary | ICD-10-CM | POA: Diagnosis not present

## 2017-02-10 DIAGNOSIS — E119 Type 2 diabetes mellitus without complications: Secondary | ICD-10-CM | POA: Insufficient documentation

## 2017-02-10 DIAGNOSIS — E1122 Type 2 diabetes mellitus with diabetic chronic kidney disease: Secondary | ICD-10-CM | POA: Diagnosis not present

## 2017-02-10 LAB — LIPID PANEL
CHOL/HDL RATIO: 4 ratio
CHOLESTEROL: 119 mg/dL (ref 0–200)
HDL: 30 mg/dL — ABNORMAL LOW (ref >40)
LDL Cholesterol: 64 mg/dL (ref 0–99)
TRIGLYCERIDES: 127 mg/dL (ref <150)
VLDL: 25 mg/dL (ref 0–40)

## 2017-02-10 LAB — BASIC METABOLIC PANEL
ANION GAP: 7 (ref 5–15)
BUN: 21 mg/dL — ABNORMAL HIGH (ref 6–20)
CHLORIDE: 104 mmol/L (ref 101–111)
CO2: 28 mmol/L (ref 22–32)
CREATININE: 1.2 mg/dL (ref 0.61–1.24)
Calcium: 9 mg/dL (ref 8.9–10.3)
GFR calc non Af Amer: 57 mL/min — ABNORMAL LOW (ref >60)
GLUCOSE: 130 mg/dL — AB (ref 65–99)
Potassium: 4 mmol/L (ref 3.5–5.1)
Sodium: 139 mmol/L (ref 135–145)

## 2017-02-11 LAB — MISC LABCORP TEST (SEND OUT): Labcorp test code: 143000

## 2017-02-11 LAB — HEMOGLOBIN A1C
Hgb A1c MFr Bld: 7.6 % — ABNORMAL HIGH (ref 4.8–5.6)
Mean Plasma Glucose: 171

## 2017-02-11 LAB — MICROALBUMIN / CREATININE URINE RATIO
Creatinine, Urine: 110.6 mg/dL
MICROALB UR: 91.3 ug/mL — AB
MICROALB/CREAT RATIO: 82.5 mg/g — AB (ref 0.0–30.0)

## 2017-02-12 ENCOUNTER — Ambulatory Visit: Payer: Medicare Other | Admitting: Pulmonary Disease

## 2017-02-12 ENCOUNTER — Encounter: Payer: Self-pay | Admitting: Pulmonary Disease

## 2017-02-12 ENCOUNTER — Ambulatory Visit (INDEPENDENT_AMBULATORY_CARE_PROVIDER_SITE_OTHER): Payer: Medicare Other | Admitting: Pulmonary Disease

## 2017-02-12 DIAGNOSIS — J13 Pneumonia due to Streptococcus pneumoniae: Secondary | ICD-10-CM | POA: Diagnosis not present

## 2017-02-12 DIAGNOSIS — Z23 Encounter for immunization: Secondary | ICD-10-CM

## 2017-02-12 DIAGNOSIS — J431 Panlobular emphysema: Secondary | ICD-10-CM | POA: Diagnosis not present

## 2017-02-12 DIAGNOSIS — J9611 Chronic respiratory failure with hypoxia: Secondary | ICD-10-CM | POA: Diagnosis not present

## 2017-02-12 MED ORDER — BUDESONIDE-FORMOTEROL FUMARATE 160-4.5 MCG/ACT IN AERO: 2.0000 | INHALATION_SPRAY | Freq: Two times a day (BID) | RESPIRATORY_TRACT | 0 refills | Status: AC

## 2017-02-12 MED ORDER — ALBUTEROL SULFATE HFA 108 (90 BASE) MCG/ACT IN AERS: 2.0000 | INHALATION_SPRAY | Freq: Four times a day (QID) | RESPIRATORY_TRACT | 11 refills | Status: AC | PRN

## 2017-02-12 MED ORDER — LEVOFLOXACIN 500 MG PO TABS: 500.0000 mg | ORAL_TABLET | Freq: Every day | ORAL | 0 refills | Status: DC

## 2017-02-12 NOTE — Addendum Note (Signed)
Addended by: Maryanna Shape A on: 02/12/2017 01:43 PM   Modules accepted: Orders

## 2017-02-12 NOTE — Patient Instructions (Signed)
Keep using your oxygen as you are doing Keep taking Symbicort and Spiriva as you are doing Call us if you have problems with your breathing If you have any increase in uterus production or change in mucus color then go ahead and take the antibiotic I gave you today I will see you back in 3 months or sooner if needed

## 2017-02-12 NOTE — Assessment & Plan Note (Signed)
Continue 3 L of oxygen continuously. However, it should be noted today that he was able to ambulate on room air with normal oxygenation. Considering his severe cardiac disease and recurrent COPD exacerbations I think it's reasonable to continue this prescription for now.

## 2017-02-12 NOTE — Assessment & Plan Note (Signed)
He had pneumonia recently and still has some crackles in his left base which is typically where he experiences it. We will plan on repeating a chest x-ray when he returns in 3 months.

## 2017-02-12 NOTE — Assessment & Plan Note (Signed)
He had an exacerbation in January due to influenza but he has completely recovered since then. However, [name] does have recurrent exacerbations throughout the year. I am going to give him a prescription for Levaquin to keep on hand in the event of increasing mucus production, change in mucus color. We have been unable to use other daily antibiotics to prevent exacerbations because of of his cardiac issues.  Plan: Continue Symbicort Continue Spiriva Pneumovax today

## 2017-02-12 NOTE — Addendum Note (Signed)
Addended by: Maryanna Shape A on: 02/12/2017 04:48 PM   Modules accepted: Orders

## 2017-02-12 NOTE — Progress Notes (Signed)
Subjective:    Patient ID: [name], male    DOB: [birth year], 76 y.o.   MRN: [id number]  Synopsis: [name] was first seen in the [facility] Pulmonary office in 10/2012 for COPD.  He had been admitted to the hospital for bronchitis in months prior.  His spirometry showed clear obstruction and an FEV1 of 48% predicted.  He also has a history of a CABG and Mitral valve replacement.  He has recurrent aspiration pneumonia in the left lower lobe as well.    HPI   Chief Complaint  Patient presents with  . Follow-up    65mo rov. pt states breathing is doing well. ED admission 01/18/17 for flu & PNA. pt has improved since being discharged. c/o lingering occ prod cough with white mucus & occ wheezing mainly at night.   [name] was admitted to [facility] for influenza in January and was found to have influenza A.  His symptoms started with dyspnea, and then syncope. He had a fever along with this as well.  He had a cold sweats as well.  He took prednisone and tamiflu which helped.  He had a gout flare as well.    Since coming home he has been doing well.  H had a cough with mucus production for a while, it was white in color.  After 2-3 days he started using his albuterol which helped.    He has been taking his symbicort and spiriva regularly.  He has not had any leg swelling lately.    He has been using and benefitting from his oxygen.    Past Medical History:  Diagnosis Date  . Adenomatous polyps   . Chronic atrial fibrillation (HCC)    a. warfarin; b. CHADSVASc 6 (CHF, HTN, age x 2, DM, vascular disease) giving him an estimated annual stroke risk of 9.8%  . Chronic systolic CHF (congestive heart failure) ([place])    a. echo 10/2014: EF 30-35%, AK of inf & post myocardium, HK of apical & lat wall, ant & antsept wall best preserved, mitral: mechanical prosthesis was present & well seated, LA moderately dilated, RV systolic fxn mildly reduced, PASP nl  . COPD (chronic obstructive pulmonary  disease) ([place])    URI 9/12  . Coronary artery disease    Cath 2005- patent LIMA-LAD, SVG-OM grafts, moderate MR  . Diverticulosis    left side   . DM type 2 (diabetes mellitus, type 2) ([place])   . Glaucoma   . Gout   . H/O mitral valve prolapse   . Hard of hearing   . Hyperkalemia   . Hyperlipidemia   . Hypertension   . Myocardial infarction   . PNA (pneumonia) 03/2010  . Prostate cancer ([place]) 04/2009  . Sigmoid diverticulitis    perforated      Review of Systems  Constitutional: Negative for chills, fatigue and fever.  HENT: Negative for congestion, postnasal drip and rhinorrhea.   Respiratory: Positive for cough, shortness of breath and wheezing. Negative for choking.   Cardiovascular: Negative for chest pain, palpitations and leg swelling.  Gastrointestinal: Negative for abdominal distention, abdominal pain, constipation, diarrhea, nausea and rectal pain.       Objective:   Physical Exam  Vitals:   02/12/17 1047  BP: 124/62  BP Location: Left Arm  Cuff Size: Normal  Pulse: 72  SpO2: 96%  Weight: 154 lb 3.2 oz (69.9 kg)  Height: 5\' 6"  (1.676 m)  3L  Gen: well appearing HENT: OP clear,  TM's clear, neck supple PULM: Crackles left base B, normal percussion CV: RRR, no mgr, trace edema GI: BS+, soft, nontender Derm: no cyanosis or rash Psyche: normal mood and affect    10/25/2012 CXR >> cardiomegally, no clear infiltrate 10/25/2012 Spirometry >> clear obstruction on flow volume loop, FEV1 48% predicted 11/2012 CT chest >> LLL pneumonia, enlarged mediastinal lymph nodes 02/22/2013 CT chest [facility] >> complete resolution of pneumonia; mediastinal lymphadenopathy persists, 1.7 cm 4R node, 1.0cm 7 node 02/2013 Modified Barium Swallow [facility] >> mild oropharyngeal dysphagia characterized by decreased pharyngeal pressure generation resulting in mild vallecular residue; no tracheal penetration.  Educated by Speech on swallowing technique (sit up, small bites, eat slowly) 08/2013  CT chest > improved mediastinal lymphadenopathy 12/19/2013 > bilateral fluffy infiltrates consistent with pneumonia May 2015 CT chest> no change in chronic mediastinal lymphadenopathy May 2016 CXR > nodularity left lung March 2017 CT chest images showing complete resolution of the left lung abnormality, mediastinal lymphadenopathy is stable.  Records from his hospitalization at [facility] in January and February 2018 reviewed. He was treated for influenza a pneumonia. He had a COPD exacerbation as a consequence of that.    Assessment & Plan:   COPD (chronic obstructive pulmonary disease) ([place]) He had an exacerbation in January due to influenza but he has completely recovered since then. However, [name] does have recurrent exacerbations throughout the year. I am going to give him a prescription for Levaquin to keep on hand in the event of increasing mucus production, change in mucus color. We have been unable to use other daily antibiotics to prevent exacerbations because of of his cardiac issues.  Plan: Continue Symbicort Continue Spiriva Pneumovax today  Pneumonia He had pneumonia recently and still has some crackles in his left base which is typically where he experiences it. We will plan on repeating a chest x-ray when he returns in 3 months.  Chronic respiratory failure with hypoxia (HCC) Continue 3 L of oxygen continuously. However, it should be noted today that he was able to ambulate on room air with normal oxygenation. Considering his severe cardiac disease and recurrent COPD exacerbations I think it's reasonable to continue this prescription for now.   Updated Medication List Outpatient Encounter Prescriptions as of 02/12/2017  Medication Sig Dispense Refill  . albuterol (PROVENTIL HFA;VENTOLIN HFA) 108 (90 Base) MCG/ACT inhaler Inhale 2 puffs into the lungs every 6 (six) hours as needed for wheezing or shortness of breath. 1 Inhaler 11  . aspirin EC 81 MG tablet Take 1  tablet (81 mg total) by mouth daily.    . beta carotene w/minerals (OCUVITE) tablet Take 1 tablet by mouth daily.    . budesonide-formoterol (SYMBICORT) 160-4.5 MCG/ACT inhaler Inhale 2 puffs into the lungs 2 (two) times daily. 1 Inhaler 11  . budesonide-formoterol (SYMBICORT) 160-4.5 MCG/ACT inhaler Inhale 2 puffs into the lungs 2 (two) times daily. 1 Inhaler 0  . Cholecalciferol (VITAMIN D) 2000 UNITS tablet Take 2,000 Units by mouth daily.    . digoxin (DIGOX) 0.125 MG tablet Take 1 tablet by mouth  daily 90 tablet 3  . HYDROcodone-acetaminophen (NORCO/VICODIN) 5-325 MG tablet Take 1-2 tabs every 4-6 hrs PRN pain    . latanoprost (XALATAN) 0.005 % ophthalmic solution Place 1 drop into both eyes at bedtime.     Marland Kitchen losartan (COZAAR) 100 MG tablet Take 25 mg by mouth daily.     . metoprolol succinate (TOPROL-XL) 50 MG 24 hr tablet Take 50 mg by mouth daily. Take with  or immediately following a meal.    . OXYGEN o2 2 to 3lpm 24/7 AHC    . simvastatin (ZOCOR) 40 MG tablet Take 40 mg by mouth at bedtime.     Marland Kitchen SPIRIVA RESPIMAT 2.5 MCG/ACT AERS USE 2 PUFFS DAILY 12 g 1  . Tamsulosin HCl (FLOMAX) 0.4 MG CAPS Take 0.4 mg by mouth daily.     . Tiotropium Bromide Monohydrate (SPIRIVA RESPIMAT) 2.5 MCG/ACT AERS Inhale 2 puffs into the lungs daily. 2 Inhaler 0  . torsemide (DEMADEX) 20 MG tablet Take 40 mg by mouth 2 (two) times daily. As needed    . warfarin (COUMADIN) 2.5 MG tablet TAKE AS DIRECTED BY  COUMADIN CLINIC 120 tablet 0  . [DISCONTINUED] albuterol (PROVENTIL HFA;VENTOLIN HFA) 108 (90 Base) MCG/ACT inhaler Inhale 2 puffs into the lungs every 6 (six) hours as needed for wheezing or shortness of breath. 1 Inhaler 11  . [DISCONTINUED] levofloxacin (LEVAQUIN) 750 MG tablet Take 1 tablet (750 mg total) by mouth daily. 5 tablet 0  . [DISCONTINUED] levofloxacin (LEVAQUIN) 750 MG tablet Take 1 tablet (750 mg total) by mouth daily. 5 tablet 0  . [DISCONTINUED] predniSONE (DELTASONE) 10 MG tablet Take  40mg  po daily for 3 days, 30mg  po daily for 3 days, 20mg  po daily for 2 days, then take 10mg  po daily for 2 days 27 tablet 0  . [DISCONTINUED] predniSONE (DELTASONE) 10 MG tablet Take 40mg  daily for 3 days, then  30mg  daily for 3 days, then 20mg  daily for two days, then take 10mg  daily for 2 days 27 tablet 0  . [DISCONTINUED] predniSONE (DELTASONE) 20 MG tablet Take 1 tablet (20 mg total) by mouth daily with breakfast. 5 tablet 0  . levofloxacin (LEVAQUIN) 500 MG tablet Take 1 tablet (500 mg total) by mouth daily. 5 tablet 0   No facility-administered encounter medications on file as of 02/12/2017.

## 2017-02-13 ENCOUNTER — Telehealth: Payer: Self-pay | Admitting: Pulmonary Disease

## 2017-02-13 DIAGNOSIS — H401132 Primary open-angle glaucoma, bilateral, moderate stage: Secondary | ICD-10-CM | POA: Diagnosis not present

## 2017-02-13 NOTE — Telephone Encounter (Signed)
ATC pt- unable to leave vm, due to mailbox not being setup.  Will call back  

## 2017-02-16 MED ORDER — ALBUTEROL SULFATE (2.5 MG/3ML) 0.083% IN NEBU: 2.5000 mg | INHALATION_SOLUTION | Freq: Four times a day (QID) | RESPIRATORY_TRACT | 11 refills | Status: AC | PRN

## 2017-02-16 NOTE — Telephone Encounter (Signed)
rx sent to preferred pharmacy.  Pt aware.  Nothing further needed.  

## 2017-02-16 NOTE — Telephone Encounter (Signed)
Spoke with pt. States that he needs a refill on albuterol nebulizer solution. This is not on pt's current medication list. He states that BQ has given this to him in the past. We do not have any documentation of this.  BQ - please advise. Thanks.

## 2017-02-16 NOTE — Telephone Encounter (Signed)
OK to refill; q4-6 hr prn dyspnea, wheezing

## 2017-02-21 ENCOUNTER — Other Ambulatory Visit: Payer: Self-pay | Admitting: Pulmonary Disease

## 2017-02-26 DIAGNOSIS — Z794 Long term (current) use of insulin: Secondary | ICD-10-CM | POA: Diagnosis not present

## 2017-02-26 DIAGNOSIS — I10 Essential (primary) hypertension: Secondary | ICD-10-CM | POA: Diagnosis not present

## 2017-02-26 DIAGNOSIS — I5022 Chronic systolic (congestive) heart failure: Secondary | ICD-10-CM | POA: Diagnosis not present

## 2017-02-26 DIAGNOSIS — E119 Type 2 diabetes mellitus without complications: Secondary | ICD-10-CM | POA: Diagnosis not present

## 2017-03-04 ENCOUNTER — Ambulatory Visit (INDEPENDENT_AMBULATORY_CARE_PROVIDER_SITE_OTHER): Payer: Medicare Other

## 2017-03-04 DIAGNOSIS — I4891 Unspecified atrial fibrillation: Secondary | ICD-10-CM

## 2017-03-04 DIAGNOSIS — Z9889 Other specified postprocedural states: Secondary | ICD-10-CM | POA: Diagnosis not present

## 2017-03-04 DIAGNOSIS — I059 Rheumatic mitral valve disease, unspecified: Secondary | ICD-10-CM

## 2017-03-04 DIAGNOSIS — Z5181 Encounter for therapeutic drug level monitoring: Secondary | ICD-10-CM

## 2017-03-04 LAB — POCT INR: INR: 3.4

## 2017-03-24 DIAGNOSIS — Z79899 Other long term (current) drug therapy: Secondary | ICD-10-CM | POA: Diagnosis not present

## 2017-03-24 DIAGNOSIS — Z794 Long term (current) use of insulin: Secondary | ICD-10-CM | POA: Diagnosis not present

## 2017-03-24 DIAGNOSIS — E1165 Type 2 diabetes mellitus with hyperglycemia: Secondary | ICD-10-CM | POA: Diagnosis not present

## 2017-03-27 DIAGNOSIS — R339 Retention of urine, unspecified: Secondary | ICD-10-CM | POA: Diagnosis not present

## 2017-03-27 DIAGNOSIS — Z8546 Personal history of malignant neoplasm of prostate: Secondary | ICD-10-CM | POA: Diagnosis not present

## 2017-03-27 DIAGNOSIS — N358 Other urethral stricture: Secondary | ICD-10-CM | POA: Diagnosis not present

## 2017-04-15 ENCOUNTER — Ambulatory Visit (INDEPENDENT_AMBULATORY_CARE_PROVIDER_SITE_OTHER): Payer: Medicare Other

## 2017-04-15 DIAGNOSIS — Z5181 Encounter for therapeutic drug level monitoring: Secondary | ICD-10-CM | POA: Diagnosis not present

## 2017-04-15 DIAGNOSIS — I4891 Unspecified atrial fibrillation: Secondary | ICD-10-CM | POA: Diagnosis not present

## 2017-04-15 DIAGNOSIS — I059 Rheumatic mitral valve disease, unspecified: Secondary | ICD-10-CM

## 2017-04-15 DIAGNOSIS — Z9889 Other specified postprocedural states: Secondary | ICD-10-CM

## 2017-04-15 LAB — POCT INR: INR: 4.6

## 2017-04-17 DIAGNOSIS — J811 Chronic pulmonary edema: Secondary | ICD-10-CM | POA: Diagnosis not present

## 2017-04-17 DIAGNOSIS — Z951 Presence of aortocoronary bypass graft: Secondary | ICD-10-CM | POA: Diagnosis not present

## 2017-04-17 DIAGNOSIS — K921 Melena: Secondary | ICD-10-CM | POA: Diagnosis not present

## 2017-04-17 DIAGNOSIS — I482 Chronic atrial fibrillation: Secondary | ICD-10-CM | POA: Diagnosis not present

## 2017-04-17 DIAGNOSIS — I5022 Chronic systolic (congestive) heart failure: Secondary | ICD-10-CM | POA: Diagnosis not present

## 2017-04-17 DIAGNOSIS — I517 Cardiomegaly: Secondary | ICD-10-CM | POA: Diagnosis not present

## 2017-04-17 DIAGNOSIS — Z7901 Long term (current) use of anticoagulants: Secondary | ICD-10-CM | POA: Diagnosis not present

## 2017-04-17 DIAGNOSIS — R600 Localized edema: Secondary | ICD-10-CM | POA: Diagnosis not present

## 2017-04-17 DIAGNOSIS — J9611 Chronic respiratory failure with hypoxia: Secondary | ICD-10-CM | POA: Diagnosis not present

## 2017-04-17 DIAGNOSIS — I130 Hypertensive heart and chronic kidney disease with heart failure and stage 1 through stage 4 chronic kidney disease, or unspecified chronic kidney disease: Secondary | ICD-10-CM | POA: Diagnosis not present

## 2017-04-17 DIAGNOSIS — R531 Weakness: Secondary | ICD-10-CM | POA: Diagnosis not present

## 2017-04-17 DIAGNOSIS — D649 Anemia, unspecified: Secondary | ICD-10-CM | POA: Diagnosis not present

## 2017-04-17 DIAGNOSIS — D6832 Hemorrhagic disorder due to extrinsic circulating anticoagulants: Secondary | ICD-10-CM | POA: Diagnosis not present

## 2017-04-17 DIAGNOSIS — N179 Acute kidney failure, unspecified: Secondary | ICD-10-CM | POA: Diagnosis not present

## 2017-04-17 DIAGNOSIS — R0600 Dyspnea, unspecified: Secondary | ICD-10-CM | POA: Diagnosis not present

## 2017-04-17 DIAGNOSIS — Z952 Presence of prosthetic heart valve: Secondary | ICD-10-CM | POA: Diagnosis not present

## 2017-04-17 DIAGNOSIS — Z9981 Dependence on supplemental oxygen: Secondary | ICD-10-CM | POA: Diagnosis not present

## 2017-04-18 DIAGNOSIS — Z923 Personal history of irradiation: Secondary | ICD-10-CM | POA: Diagnosis not present

## 2017-04-18 DIAGNOSIS — D649 Anemia, unspecified: Secondary | ICD-10-CM | POA: Diagnosis not present

## 2017-04-18 DIAGNOSIS — D124 Benign neoplasm of descending colon: Secondary | ICD-10-CM | POA: Diagnosis not present

## 2017-04-18 DIAGNOSIS — E785 Hyperlipidemia, unspecified: Secondary | ICD-10-CM | POA: Diagnosis present

## 2017-04-18 DIAGNOSIS — I517 Cardiomegaly: Secondary | ICD-10-CM | POA: Diagnosis not present

## 2017-04-18 DIAGNOSIS — Z953 Presence of xenogenic heart valve: Secondary | ICD-10-CM | POA: Diagnosis not present

## 2017-04-18 DIAGNOSIS — D122 Benign neoplasm of ascending colon: Secondary | ICD-10-CM | POA: Diagnosis not present

## 2017-04-18 DIAGNOSIS — I482 Chronic atrial fibrillation: Secondary | ICD-10-CM | POA: Diagnosis not present

## 2017-04-18 DIAGNOSIS — Z87891 Personal history of nicotine dependence: Secondary | ICD-10-CM | POA: Diagnosis not present

## 2017-04-18 DIAGNOSIS — J9611 Chronic respiratory failure with hypoxia: Secondary | ICD-10-CM | POA: Diagnosis not present

## 2017-04-18 DIAGNOSIS — D123 Benign neoplasm of transverse colon: Secondary | ICD-10-CM | POA: Diagnosis not present

## 2017-04-18 DIAGNOSIS — Z8709 Personal history of other diseases of the respiratory system: Secondary | ICD-10-CM | POA: Diagnosis not present

## 2017-04-18 DIAGNOSIS — Z7901 Long term (current) use of anticoagulants: Secondary | ICD-10-CM | POA: Diagnosis not present

## 2017-04-18 DIAGNOSIS — D62 Acute posthemorrhagic anemia: Secondary | ICD-10-CM | POA: Diagnosis not present

## 2017-04-18 DIAGNOSIS — D6832 Hemorrhagic disorder due to extrinsic circulating anticoagulants: Secondary | ICD-10-CM | POA: Diagnosis present

## 2017-04-18 DIAGNOSIS — N183 Chronic kidney disease, stage 3 (moderate): Secondary | ICD-10-CM | POA: Diagnosis present

## 2017-04-18 DIAGNOSIS — Z8546 Personal history of malignant neoplasm of prostate: Secondary | ICD-10-CM | POA: Diagnosis not present

## 2017-04-18 DIAGNOSIS — I5022 Chronic systolic (congestive) heart failure: Secondary | ICD-10-CM | POA: Diagnosis not present

## 2017-04-18 DIAGNOSIS — K922 Gastrointestinal hemorrhage, unspecified: Secondary | ICD-10-CM | POA: Diagnosis not present

## 2017-04-18 DIAGNOSIS — Z86010 Personal history of colonic polyps: Secondary | ICD-10-CM | POA: Diagnosis not present

## 2017-04-18 DIAGNOSIS — I25118 Atherosclerotic heart disease of native coronary artery with other forms of angina pectoris: Secondary | ICD-10-CM | POA: Diagnosis not present

## 2017-04-18 DIAGNOSIS — Z794 Long term (current) use of insulin: Secondary | ICD-10-CM | POA: Diagnosis not present

## 2017-04-18 DIAGNOSIS — K921 Melena: Secondary | ICD-10-CM | POA: Diagnosis not present

## 2017-04-18 DIAGNOSIS — K228 Other specified diseases of esophagus: Secondary | ICD-10-CM | POA: Diagnosis not present

## 2017-04-18 DIAGNOSIS — I25119 Atherosclerotic heart disease of native coronary artery with unspecified angina pectoris: Secondary | ICD-10-CM | POA: Diagnosis present

## 2017-04-18 DIAGNOSIS — I130 Hypertensive heart and chronic kidney disease with heart failure and stage 1 through stage 4 chronic kidney disease, or unspecified chronic kidney disease: Secondary | ICD-10-CM | POA: Diagnosis present

## 2017-04-18 DIAGNOSIS — N179 Acute kidney failure, unspecified: Secondary | ICD-10-CM | POA: Diagnosis present

## 2017-04-18 DIAGNOSIS — K229 Disease of esophagus, unspecified: Secondary | ICD-10-CM | POA: Diagnosis not present

## 2017-04-18 DIAGNOSIS — T45515A Adverse effect of anticoagulants, initial encounter: Secondary | ICD-10-CM | POA: Diagnosis present

## 2017-04-18 DIAGNOSIS — E119 Type 2 diabetes mellitus without complications: Secondary | ICD-10-CM | POA: Diagnosis not present

## 2017-04-18 DIAGNOSIS — J449 Chronic obstructive pulmonary disease, unspecified: Secondary | ICD-10-CM | POA: Diagnosis present

## 2017-04-18 DIAGNOSIS — B3781 Candidal esophagitis: Secondary | ICD-10-CM | POA: Diagnosis present

## 2017-04-18 DIAGNOSIS — Z9049 Acquired absence of other specified parts of digestive tract: Secondary | ICD-10-CM | POA: Diagnosis not present

## 2017-04-18 DIAGNOSIS — I10 Essential (primary) hypertension: Secondary | ICD-10-CM | POA: Diagnosis not present

## 2017-04-18 DIAGNOSIS — Z951 Presence of aortocoronary bypass graft: Secondary | ICD-10-CM | POA: Diagnosis not present

## 2017-04-18 DIAGNOSIS — K30 Functional dyspepsia: Secondary | ICD-10-CM | POA: Diagnosis not present

## 2017-04-18 DIAGNOSIS — R918 Other nonspecific abnormal finding of lung field: Secondary | ICD-10-CM | POA: Diagnosis not present

## 2017-04-18 DIAGNOSIS — E1122 Type 2 diabetes mellitus with diabetic chronic kidney disease: Secondary | ICD-10-CM | POA: Diagnosis present

## 2017-04-18 DIAGNOSIS — Z952 Presence of prosthetic heart valve: Secondary | ICD-10-CM | POA: Diagnosis not present

## 2017-04-19 IMAGING — DX DG CHEST 2V
2 series · 2 of 2 positions shown · non-contrast
Comparison: 10/09/2015 and prior radiographs

CLINICAL DATA: 74-year-old male with shortness of breath, cough and
wheezing for 3 months.

EXAM:
CHEST  2 VIEW

[chest pa]
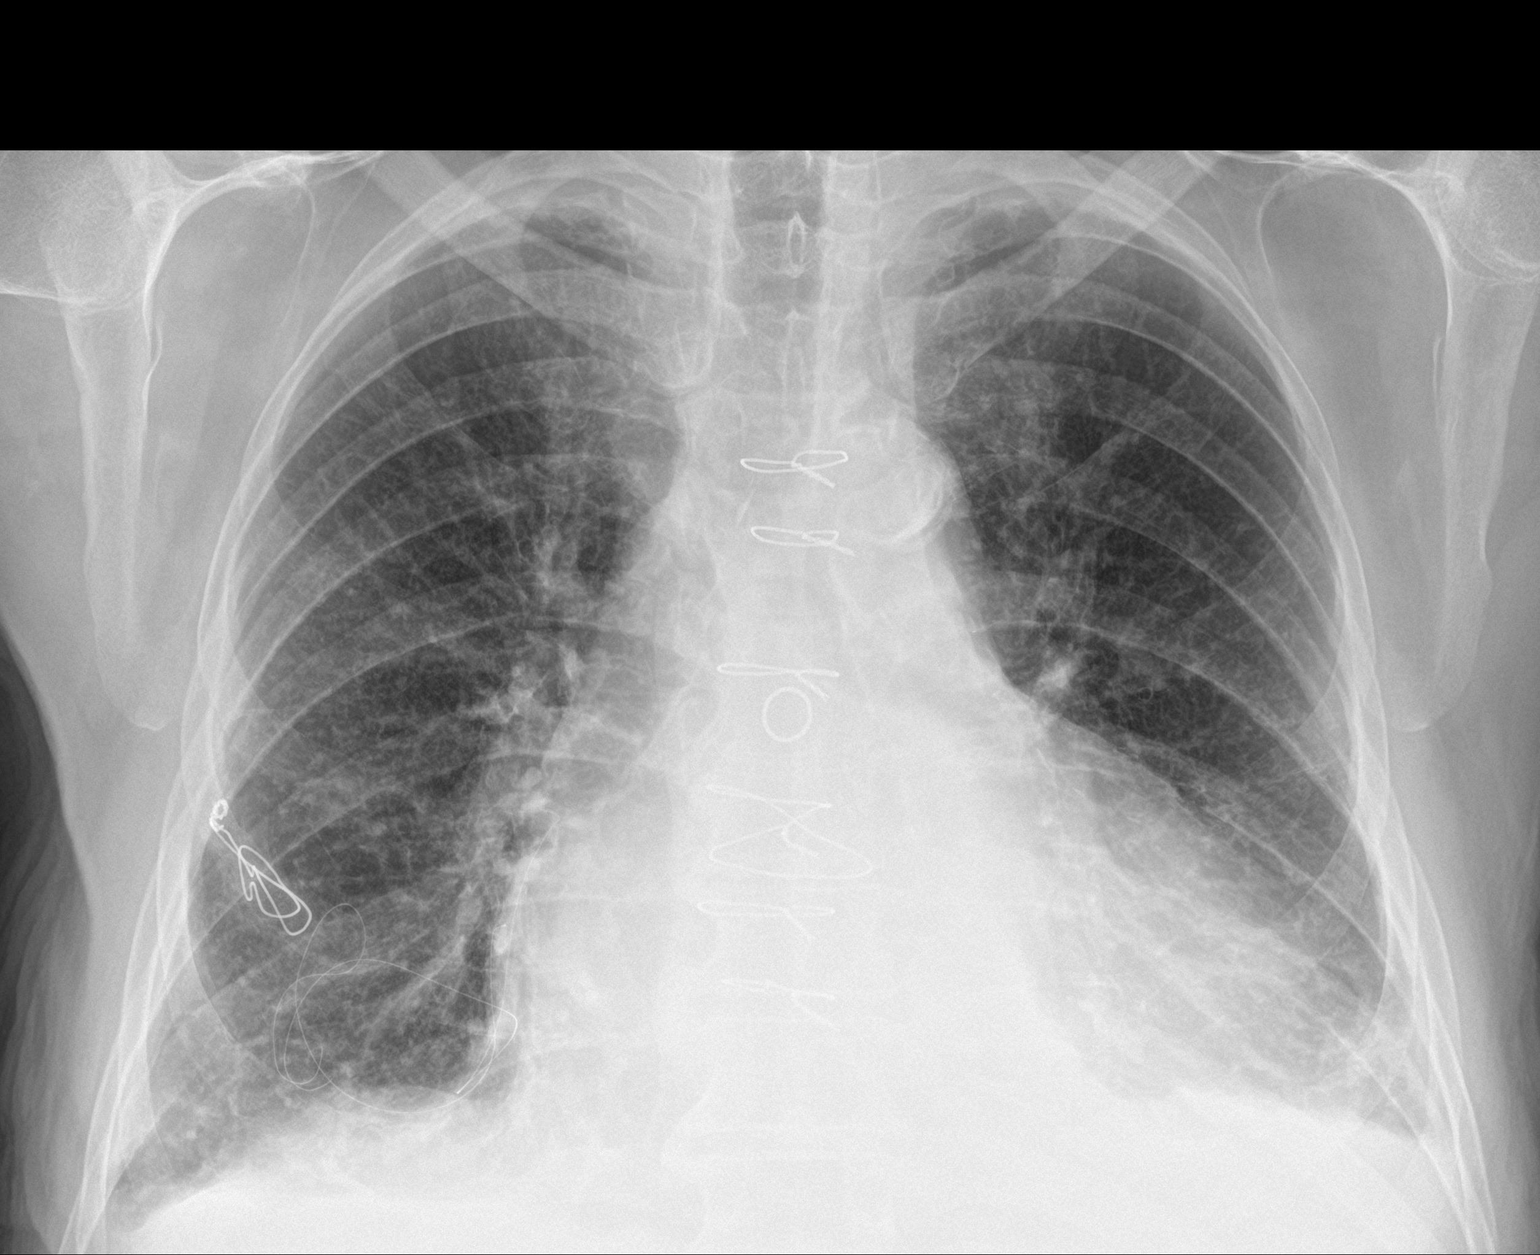

[chest lat]
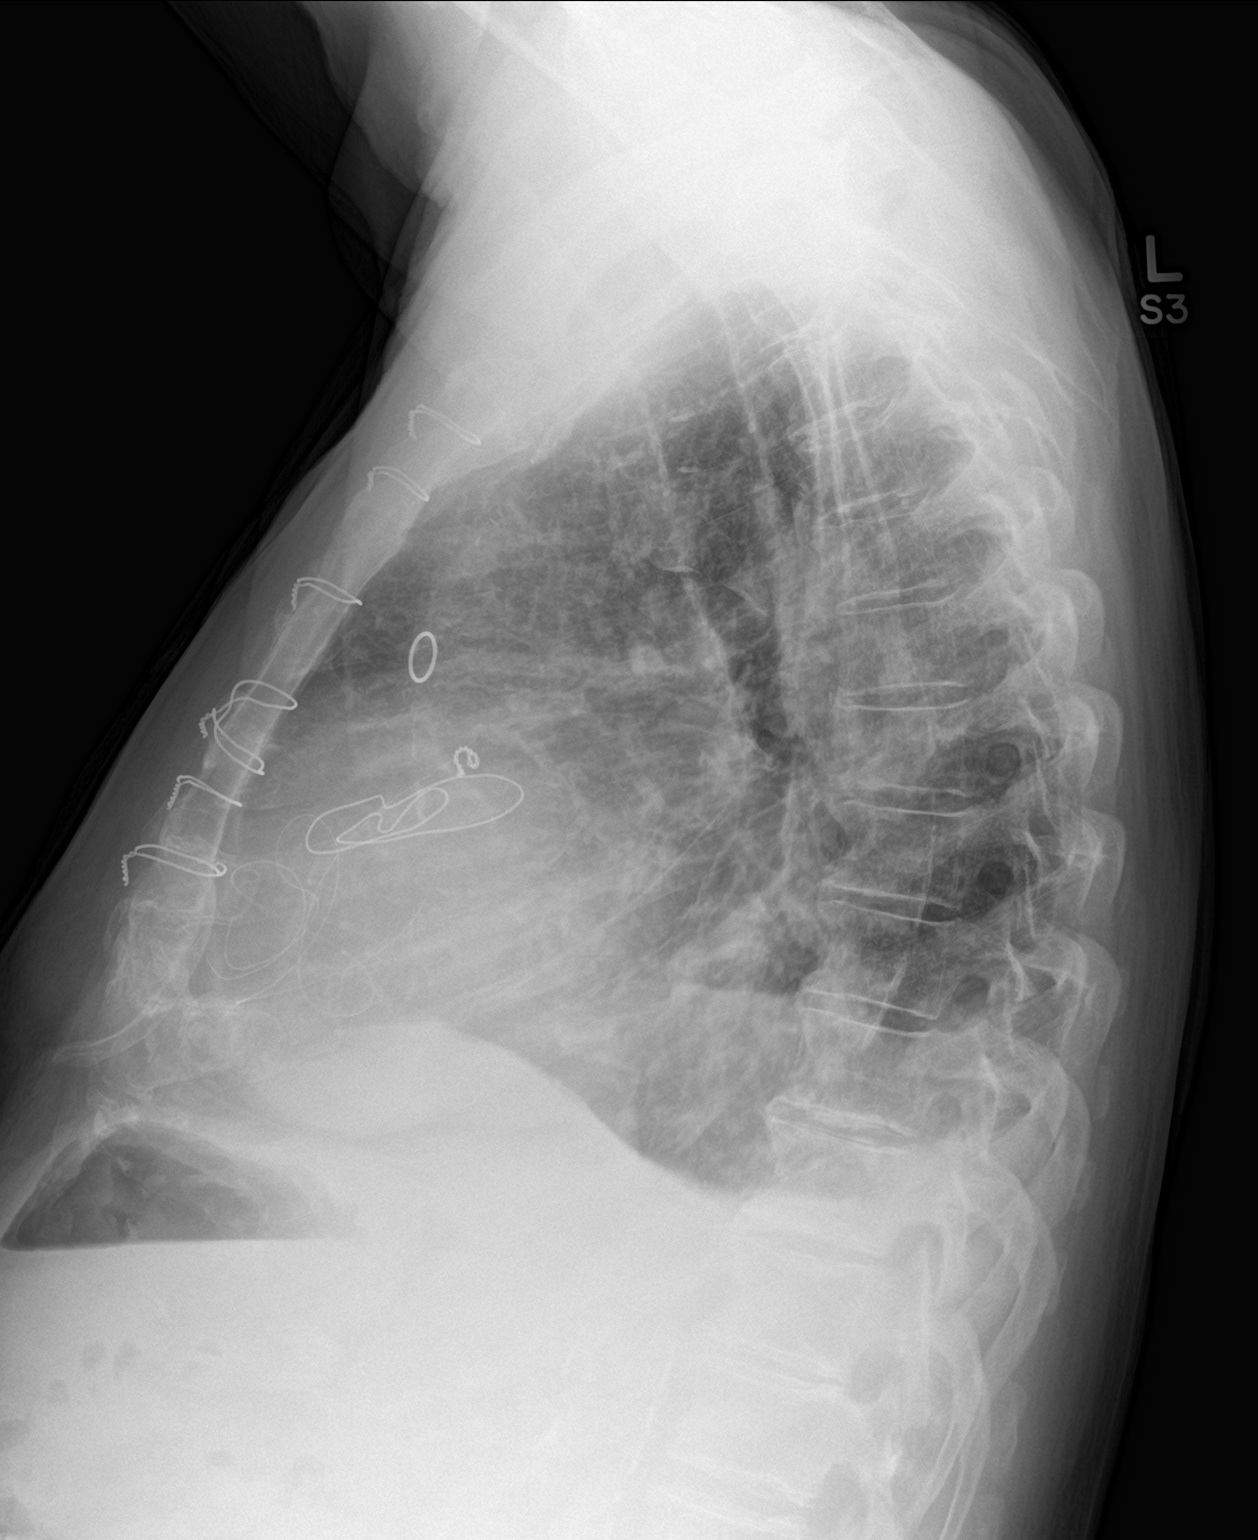

[2 of 2 positions shown; findings below may reference images not displayed]

FINDINGS: Cardiomegaly, CABG changes, bibasilar atelectasis and small
effusions again noted.

There is no evidence of pneumothorax or acute bony abnormality.

No interval changes identified.
IMPRESSION: Unchanged appearance of the chest with cardiomegaly, bibasilar
atelectasis and small effusions.

## 2017-04-23 DIAGNOSIS — K921 Melena: Secondary | ICD-10-CM | POA: Diagnosis not present

## 2017-05-01 ENCOUNTER — Ambulatory Visit (INDEPENDENT_AMBULATORY_CARE_PROVIDER_SITE_OTHER): Payer: Medicare Other | Admitting: *Deleted

## 2017-05-01 DIAGNOSIS — I059 Rheumatic mitral valve disease, unspecified: Secondary | ICD-10-CM | POA: Diagnosis not present

## 2017-05-01 DIAGNOSIS — I4891 Unspecified atrial fibrillation: Secondary | ICD-10-CM

## 2017-05-01 DIAGNOSIS — Z5181 Encounter for therapeutic drug level monitoring: Secondary | ICD-10-CM | POA: Diagnosis not present

## 2017-05-01 DIAGNOSIS — Z9889 Other specified postprocedural states: Secondary | ICD-10-CM | POA: Diagnosis not present

## 2017-05-01 LAB — POCT INR: INR: 2.8

## 2017-05-04 ENCOUNTER — Ambulatory Visit (INDEPENDENT_AMBULATORY_CARE_PROVIDER_SITE_OTHER): Payer: Medicare Other

## 2017-05-04 DIAGNOSIS — I4891 Unspecified atrial fibrillation: Secondary | ICD-10-CM

## 2017-05-04 DIAGNOSIS — I059 Rheumatic mitral valve disease, unspecified: Secondary | ICD-10-CM | POA: Diagnosis not present

## 2017-05-04 DIAGNOSIS — E1122 Type 2 diabetes mellitus with diabetic chronic kidney disease: Secondary | ICD-10-CM | POA: Diagnosis not present

## 2017-05-04 DIAGNOSIS — Z794 Long term (current) use of insulin: Secondary | ICD-10-CM | POA: Diagnosis not present

## 2017-05-04 DIAGNOSIS — Z5181 Encounter for therapeutic drug level monitoring: Secondary | ICD-10-CM

## 2017-05-04 DIAGNOSIS — N183 Chronic kidney disease, stage 3 (moderate): Secondary | ICD-10-CM | POA: Diagnosis not present

## 2017-05-04 DIAGNOSIS — Z9889 Other specified postprocedural states: Secondary | ICD-10-CM | POA: Diagnosis not present

## 2017-05-04 LAB — POCT INR: INR: 2.3

## 2017-05-05 DIAGNOSIS — J811 Chronic pulmonary edema: Secondary | ICD-10-CM | POA: Diagnosis not present

## 2017-05-05 DIAGNOSIS — I482 Chronic atrial fibrillation: Secondary | ICD-10-CM | POA: Diagnosis not present

## 2017-05-05 DIAGNOSIS — I5041 Acute combined systolic (congestive) and diastolic (congestive) heart failure: Secondary | ICD-10-CM | POA: Diagnosis not present

## 2017-05-05 DIAGNOSIS — I509 Heart failure, unspecified: Secondary | ICD-10-CM | POA: Diagnosis not present

## 2017-05-05 DIAGNOSIS — J9621 Acute and chronic respiratory failure with hypoxia: Secondary | ICD-10-CM | POA: Diagnosis not present

## 2017-05-05 DIAGNOSIS — Z953 Presence of xenogenic heart valve: Secondary | ICD-10-CM | POA: Diagnosis not present

## 2017-05-05 DIAGNOSIS — B952 Enterococcus as the cause of diseases classified elsewhere: Secondary | ICD-10-CM | POA: Diagnosis not present

## 2017-05-05 DIAGNOSIS — E1165 Type 2 diabetes mellitus with hyperglycemia: Secondary | ICD-10-CM | POA: Diagnosis not present

## 2017-05-05 DIAGNOSIS — Z7951 Long term (current) use of inhaled steroids: Secondary | ICD-10-CM | POA: Diagnosis not present

## 2017-05-05 DIAGNOSIS — Z951 Presence of aortocoronary bypass graft: Secondary | ICD-10-CM | POA: Diagnosis not present

## 2017-05-05 DIAGNOSIS — I330 Acute and subacute infective endocarditis: Secondary | ICD-10-CM | POA: Diagnosis present

## 2017-05-05 DIAGNOSIS — R262 Difficulty in walking, not elsewhere classified: Secondary | ICD-10-CM | POA: Diagnosis not present

## 2017-05-05 DIAGNOSIS — N178 Other acute kidney failure: Secondary | ICD-10-CM | POA: Diagnosis not present

## 2017-05-05 DIAGNOSIS — E7800 Pure hypercholesterolemia, unspecified: Secondary | ICD-10-CM | POA: Diagnosis present

## 2017-05-05 DIAGNOSIS — J449 Chronic obstructive pulmonary disease, unspecified: Secondary | ICD-10-CM | POA: Diagnosis present

## 2017-05-05 DIAGNOSIS — E119 Type 2 diabetes mellitus without complications: Secondary | ICD-10-CM | POA: Diagnosis not present

## 2017-05-05 DIAGNOSIS — R7881 Bacteremia: Secondary | ICD-10-CM | POA: Diagnosis not present

## 2017-05-05 DIAGNOSIS — Z452 Encounter for adjustment and management of vascular access device: Secondary | ICD-10-CM | POA: Diagnosis not present

## 2017-05-05 DIAGNOSIS — Z933 Colostomy status: Secondary | ICD-10-CM | POA: Diagnosis not present

## 2017-05-05 DIAGNOSIS — Z79899 Other long term (current) drug therapy: Secondary | ICD-10-CM | POA: Diagnosis not present

## 2017-05-05 DIAGNOSIS — I130 Hypertensive heart and chronic kidney disease with heart failure and stage 1 through stage 4 chronic kidney disease, or unspecified chronic kidney disease: Secondary | ICD-10-CM | POA: Diagnosis present

## 2017-05-05 DIAGNOSIS — I110 Hypertensive heart disease with heart failure: Secondary | ICD-10-CM | POA: Diagnosis not present

## 2017-05-05 DIAGNOSIS — R5381 Other malaise: Secondary | ICD-10-CM | POA: Diagnosis not present

## 2017-05-05 DIAGNOSIS — T826XXA Infection and inflammatory reaction due to cardiac valve prosthesis, initial encounter: Secondary | ICD-10-CM | POA: Diagnosis present

## 2017-05-05 DIAGNOSIS — R918 Other nonspecific abnormal finding of lung field: Secondary | ICD-10-CM | POA: Diagnosis not present

## 2017-05-05 DIAGNOSIS — E1122 Type 2 diabetes mellitus with diabetic chronic kidney disease: Secondary | ICD-10-CM | POA: Diagnosis present

## 2017-05-05 DIAGNOSIS — N289 Disorder of kidney and ureter, unspecified: Secondary | ICD-10-CM | POA: Diagnosis not present

## 2017-05-05 DIAGNOSIS — I255 Ischemic cardiomyopathy: Secondary | ICD-10-CM | POA: Diagnosis present

## 2017-05-05 DIAGNOSIS — Z952 Presence of prosthetic heart valve: Secondary | ICD-10-CM | POA: Diagnosis not present

## 2017-05-05 DIAGNOSIS — Z794 Long term (current) use of insulin: Secondary | ICD-10-CM | POA: Diagnosis not present

## 2017-05-05 DIAGNOSIS — I5022 Chronic systolic (congestive) heart failure: Secondary | ICD-10-CM | POA: Diagnosis not present

## 2017-05-05 DIAGNOSIS — N183 Chronic kidney disease, stage 3 (moderate): Secondary | ICD-10-CM | POA: Diagnosis present

## 2017-05-05 DIAGNOSIS — Z7901 Long term (current) use of anticoagulants: Secondary | ICD-10-CM | POA: Diagnosis not present

## 2017-05-05 DIAGNOSIS — Z8679 Personal history of other diseases of the circulatory system: Secondary | ICD-10-CM | POA: Diagnosis not present

## 2017-05-05 DIAGNOSIS — Z87891 Personal history of nicotine dependence: Secondary | ICD-10-CM | POA: Diagnosis not present

## 2017-05-05 DIAGNOSIS — N189 Chronic kidney disease, unspecified: Secondary | ICD-10-CM | POA: Diagnosis not present

## 2017-05-05 DIAGNOSIS — Z9981 Dependence on supplemental oxygen: Secondary | ICD-10-CM | POA: Diagnosis not present

## 2017-05-05 DIAGNOSIS — I5023 Acute on chronic systolic (congestive) heart failure: Secondary | ICD-10-CM | POA: Diagnosis not present

## 2017-05-05 DIAGNOSIS — R509 Fever, unspecified: Secondary | ICD-10-CM | POA: Diagnosis not present

## 2017-05-05 DIAGNOSIS — M25562 Pain in left knee: Secondary | ICD-10-CM | POA: Diagnosis present

## 2017-05-05 DIAGNOSIS — B3781 Candidal esophagitis: Secondary | ICD-10-CM | POA: Diagnosis present

## 2017-05-05 DIAGNOSIS — Z8546 Personal history of malignant neoplasm of prostate: Secondary | ICD-10-CM | POA: Diagnosis not present

## 2017-05-05 DIAGNOSIS — I517 Cardiomegaly: Secondary | ICD-10-CM | POA: Diagnosis not present

## 2017-05-05 DIAGNOSIS — I2510 Atherosclerotic heart disease of native coronary artery without angina pectoris: Secondary | ICD-10-CM | POA: Diagnosis not present

## 2017-05-05 DIAGNOSIS — I452 Bifascicular block: Secondary | ICD-10-CM | POA: Diagnosis present

## 2017-05-12 ENCOUNTER — Ambulatory Visit: Payer: Medicare Other | Admitting: Pulmonary Disease

## 2017-05-13 ENCOUNTER — Ambulatory Visit (INDEPENDENT_AMBULATORY_CARE_PROVIDER_SITE_OTHER): Payer: Medicare Other

## 2017-05-13 DIAGNOSIS — Z9889 Other specified postprocedural states: Secondary | ICD-10-CM | POA: Diagnosis not present

## 2017-05-13 DIAGNOSIS — I4891 Unspecified atrial fibrillation: Secondary | ICD-10-CM

## 2017-05-13 DIAGNOSIS — Z5181 Encounter for therapeutic drug level monitoring: Secondary | ICD-10-CM | POA: Diagnosis not present

## 2017-05-13 DIAGNOSIS — I059 Rheumatic mitral valve disease, unspecified: Secondary | ICD-10-CM | POA: Diagnosis not present

## 2017-05-13 LAB — POCT INR: INR: 3.5

## 2017-05-18 DIAGNOSIS — I481 Persistent atrial fibrillation: Secondary | ICD-10-CM | POA: Diagnosis not present

## 2017-05-18 DIAGNOSIS — N179 Acute kidney failure, unspecified: Secondary | ICD-10-CM | POA: Diagnosis not present

## 2017-05-18 DIAGNOSIS — I5023 Acute on chronic systolic (congestive) heart failure: Secondary | ICD-10-CM | POA: Diagnosis not present

## 2017-05-18 DIAGNOSIS — Z954 Presence of other heart-valve replacement: Secondary | ICD-10-CM | POA: Diagnosis not present

## 2017-05-18 DIAGNOSIS — D62 Acute posthemorrhagic anemia: Secondary | ICD-10-CM | POA: Diagnosis not present

## 2017-05-18 DIAGNOSIS — Z7901 Long term (current) use of anticoagulants: Secondary | ICD-10-CM | POA: Diagnosis not present

## 2017-05-18 DIAGNOSIS — I130 Hypertensive heart and chronic kidney disease with heart failure and stage 1 through stage 4 chronic kidney disease, or unspecified chronic kidney disease: Secondary | ICD-10-CM | POA: Diagnosis not present

## 2017-05-18 DIAGNOSIS — I21A1 Myocardial infarction type 2: Secondary | ICD-10-CM | POA: Diagnosis not present

## 2017-05-18 DIAGNOSIS — K921 Melena: Secondary | ICD-10-CM | POA: Diagnosis not present

## 2017-05-18 DIAGNOSIS — Z934 Other artificial openings of gastrointestinal tract status: Secondary | ICD-10-CM | POA: Diagnosis not present

## 2017-05-21 ENCOUNTER — Ambulatory Visit: Payer: Medicare Other | Admitting: Pulmonary Disease

## 2017-05-21 DIAGNOSIS — J9611 Chronic respiratory failure with hypoxia: Secondary | ICD-10-CM | POA: Diagnosis present

## 2017-05-21 DIAGNOSIS — Z9049 Acquired absence of other specified parts of digestive tract: Secondary | ICD-10-CM | POA: Diagnosis not present

## 2017-05-21 DIAGNOSIS — I2721 Secondary pulmonary arterial hypertension: Secondary | ICD-10-CM | POA: Diagnosis not present

## 2017-05-21 DIAGNOSIS — Z954 Presence of other heart-valve replacement: Secondary | ICD-10-CM | POA: Diagnosis not present

## 2017-05-21 DIAGNOSIS — T380X5A Adverse effect of glucocorticoids and synthetic analogues, initial encounter: Secondary | ICD-10-CM | POA: Diagnosis not present

## 2017-05-21 DIAGNOSIS — R7881 Bacteremia: Secondary | ICD-10-CM | POA: Diagnosis not present

## 2017-05-21 DIAGNOSIS — I50813 Acute on chronic right heart failure: Secondary | ICD-10-CM | POA: Diagnosis not present

## 2017-05-21 DIAGNOSIS — R262 Difficulty in walking, not elsewhere classified: Secondary | ICD-10-CM | POA: Diagnosis not present

## 2017-05-21 DIAGNOSIS — Z719 Counseling, unspecified: Secondary | ICD-10-CM | POA: Diagnosis not present

## 2017-05-21 DIAGNOSIS — Z7901 Long term (current) use of anticoagulants: Secondary | ICD-10-CM | POA: Diagnosis not present

## 2017-05-21 DIAGNOSIS — Z8546 Personal history of malignant neoplasm of prostate: Secondary | ICD-10-CM | POA: Diagnosis not present

## 2017-05-21 DIAGNOSIS — I509 Heart failure, unspecified: Secondary | ICD-10-CM | POA: Diagnosis not present

## 2017-05-21 DIAGNOSIS — I5023 Acute on chronic systolic (congestive) heart failure: Secondary | ICD-10-CM | POA: Diagnosis present

## 2017-05-21 DIAGNOSIS — I5043 Acute on chronic combined systolic (congestive) and diastolic (congestive) heart failure: Secondary | ICD-10-CM | POA: Diagnosis not present

## 2017-05-21 DIAGNOSIS — I255 Ischemic cardiomyopathy: Secondary | ICD-10-CM | POA: Diagnosis not present

## 2017-05-21 DIAGNOSIS — I350 Nonrheumatic aortic (valve) stenosis: Secondary | ICD-10-CM | POA: Diagnosis not present

## 2017-05-21 DIAGNOSIS — J449 Chronic obstructive pulmonary disease, unspecified: Secondary | ICD-10-CM | POA: Diagnosis present

## 2017-05-21 DIAGNOSIS — R918 Other nonspecific abnormal finding of lung field: Secondary | ICD-10-CM | POA: Diagnosis not present

## 2017-05-21 DIAGNOSIS — N183 Chronic kidney disease, stage 3 (moderate): Secondary | ICD-10-CM | POA: Diagnosis present

## 2017-05-21 DIAGNOSIS — B952 Enterococcus as the cause of diseases classified elsewhere: Secondary | ICD-10-CM | POA: Diagnosis not present

## 2017-05-21 DIAGNOSIS — E119 Type 2 diabetes mellitus without complications: Secondary | ICD-10-CM | POA: Diagnosis not present

## 2017-05-21 DIAGNOSIS — Z79899 Other long term (current) drug therapy: Secondary | ICD-10-CM | POA: Diagnosis not present

## 2017-05-21 DIAGNOSIS — E7800 Pure hypercholesterolemia, unspecified: Secondary | ICD-10-CM | POA: Diagnosis present

## 2017-05-21 DIAGNOSIS — I471 Supraventricular tachycardia: Secondary | ICD-10-CM | POA: Diagnosis not present

## 2017-05-21 DIAGNOSIS — I130 Hypertensive heart and chronic kidney disease with heart failure and stage 1 through stage 4 chronic kidney disease, or unspecified chronic kidney disease: Secondary | ICD-10-CM | POA: Diagnosis present

## 2017-05-21 DIAGNOSIS — E118 Type 2 diabetes mellitus with unspecified complications: Secondary | ICD-10-CM | POA: Diagnosis not present

## 2017-05-21 DIAGNOSIS — I4891 Unspecified atrial fibrillation: Secondary | ICD-10-CM | POA: Diagnosis not present

## 2017-05-21 DIAGNOSIS — N179 Acute kidney failure, unspecified: Secondary | ICD-10-CM | POA: Diagnosis not present

## 2017-05-21 DIAGNOSIS — I21A1 Myocardial infarction type 2: Secondary | ICD-10-CM | POA: Diagnosis present

## 2017-05-21 DIAGNOSIS — I481 Persistent atrial fibrillation: Secondary | ICD-10-CM | POA: Diagnosis present

## 2017-05-21 DIAGNOSIS — I340 Nonrheumatic mitral (valve) insufficiency: Secondary | ICD-10-CM | POA: Diagnosis not present

## 2017-05-21 DIAGNOSIS — I517 Cardiomegaly: Secondary | ICD-10-CM | POA: Diagnosis not present

## 2017-05-21 DIAGNOSIS — Z933 Colostomy status: Secondary | ICD-10-CM | POA: Diagnosis not present

## 2017-05-21 DIAGNOSIS — I482 Chronic atrial fibrillation: Secondary | ICD-10-CM | POA: Diagnosis not present

## 2017-05-21 DIAGNOSIS — Z87891 Personal history of nicotine dependence: Secondary | ICD-10-CM | POA: Diagnosis not present

## 2017-05-21 DIAGNOSIS — M109 Gout, unspecified: Secondary | ICD-10-CM | POA: Diagnosis present

## 2017-05-21 DIAGNOSIS — D649 Anemia, unspecified: Secondary | ICD-10-CM | POA: Diagnosis not present

## 2017-05-21 DIAGNOSIS — Z951 Presence of aortocoronary bypass graft: Secondary | ICD-10-CM | POA: Diagnosis not present

## 2017-05-21 DIAGNOSIS — Z794 Long term (current) use of insulin: Secondary | ICD-10-CM | POA: Diagnosis not present

## 2017-05-21 DIAGNOSIS — I4892 Unspecified atrial flutter: Secondary | ICD-10-CM | POA: Diagnosis not present

## 2017-05-21 DIAGNOSIS — I2510 Atherosclerotic heart disease of native coronary artery without angina pectoris: Secondary | ICD-10-CM | POA: Diagnosis not present

## 2017-05-21 DIAGNOSIS — I361 Nonrheumatic tricuspid (valve) insufficiency: Secondary | ICD-10-CM | POA: Diagnosis not present

## 2017-05-21 DIAGNOSIS — Z1329 Encounter for screening for other suspected endocrine disorder: Secondary | ICD-10-CM | POA: Diagnosis not present

## 2017-05-21 DIAGNOSIS — I5189 Other ill-defined heart diseases: Secondary | ICD-10-CM | POA: Diagnosis not present

## 2017-05-21 DIAGNOSIS — R931 Abnormal findings on diagnostic imaging of heart and coronary circulation: Secondary | ICD-10-CM | POA: Diagnosis not present

## 2017-05-21 DIAGNOSIS — R739 Hyperglycemia, unspecified: Secondary | ICD-10-CM | POA: Diagnosis not present

## 2017-05-21 DIAGNOSIS — Z953 Presence of xenogenic heart valve: Secondary | ICD-10-CM | POA: Diagnosis not present

## 2017-05-21 DIAGNOSIS — Z9981 Dependence on supplemental oxygen: Secondary | ICD-10-CM | POA: Diagnosis not present

## 2017-05-21 DIAGNOSIS — Z952 Presence of prosthetic heart valve: Secondary | ICD-10-CM | POA: Diagnosis not present

## 2017-05-21 DIAGNOSIS — Z934 Other artificial openings of gastrointestinal tract status: Secondary | ICD-10-CM | POA: Diagnosis not present

## 2017-05-21 DIAGNOSIS — D62 Acute posthemorrhagic anemia: Secondary | ICD-10-CM | POA: Diagnosis not present

## 2017-05-21 DIAGNOSIS — K921 Melena: Secondary | ICD-10-CM | POA: Diagnosis not present

## 2017-05-21 DIAGNOSIS — E1165 Type 2 diabetes mellitus with hyperglycemia: Secondary | ICD-10-CM | POA: Diagnosis not present

## 2017-06-12 ENCOUNTER — Ambulatory Visit: Payer: Medicare Other | Admitting: Pulmonary Disease

## 2017-06-12 DIAGNOSIS — Z794 Long term (current) use of insulin: Secondary | ICD-10-CM | POA: Diagnosis not present

## 2017-06-12 DIAGNOSIS — I255 Ischemic cardiomyopathy: Secondary | ICD-10-CM | POA: Diagnosis not present

## 2017-06-12 DIAGNOSIS — E8770 Fluid overload, unspecified: Secondary | ICD-10-CM | POA: Diagnosis not present

## 2017-06-12 DIAGNOSIS — Z87891 Personal history of nicotine dependence: Secondary | ICD-10-CM | POA: Diagnosis not present

## 2017-06-12 DIAGNOSIS — I482 Chronic atrial fibrillation: Secondary | ICD-10-CM | POA: Diagnosis not present

## 2017-06-12 DIAGNOSIS — I509 Heart failure, unspecified: Secondary | ICD-10-CM | POA: Diagnosis not present

## 2017-06-12 DIAGNOSIS — E119 Type 2 diabetes mellitus without complications: Secondary | ICD-10-CM | POA: Diagnosis not present

## 2017-06-12 DIAGNOSIS — I5020 Unspecified systolic (congestive) heart failure: Secondary | ICD-10-CM | POA: Diagnosis not present

## 2017-06-15 ENCOUNTER — Telehealth: Payer: Self-pay

## 2017-06-15 ENCOUNTER — Ambulatory Visit (INDEPENDENT_AMBULATORY_CARE_PROVIDER_SITE_OTHER): Payer: Self-pay | Admitting: Internal Medicine

## 2017-06-15 DIAGNOSIS — I442 Atrioventricular block, complete: Secondary | ICD-10-CM | POA: Diagnosis not present

## 2017-06-15 DIAGNOSIS — I482 Chronic atrial fibrillation: Secondary | ICD-10-CM | POA: Diagnosis not present

## 2017-06-15 DIAGNOSIS — I471 Supraventricular tachycardia: Secondary | ICD-10-CM | POA: Diagnosis not present

## 2017-06-15 DIAGNOSIS — Z9889 Other specified postprocedural states: Secondary | ICD-10-CM

## 2017-06-15 DIAGNOSIS — I130 Hypertensive heart and chronic kidney disease with heart failure and stage 1 through stage 4 chronic kidney disease, or unspecified chronic kidney disease: Secondary | ICD-10-CM | POA: Diagnosis not present

## 2017-06-15 DIAGNOSIS — Z952 Presence of prosthetic heart valve: Secondary | ICD-10-CM | POA: Diagnosis not present

## 2017-06-15 DIAGNOSIS — R918 Other nonspecific abnormal finding of lung field: Secondary | ICD-10-CM | POA: Diagnosis not present

## 2017-06-15 DIAGNOSIS — Z5181 Encounter for therapeutic drug level monitoring: Secondary | ICD-10-CM

## 2017-06-15 DIAGNOSIS — I481 Persistent atrial fibrillation: Secondary | ICD-10-CM | POA: Diagnosis not present

## 2017-06-15 DIAGNOSIS — N179 Acute kidney failure, unspecified: Secondary | ICD-10-CM | POA: Diagnosis not present

## 2017-06-15 DIAGNOSIS — I4892 Unspecified atrial flutter: Secondary | ICD-10-CM | POA: Diagnosis not present

## 2017-06-15 DIAGNOSIS — I5043 Acute on chronic combined systolic (congestive) and diastolic (congestive) heart failure: Secondary | ICD-10-CM | POA: Diagnosis not present

## 2017-06-15 DIAGNOSIS — I5023 Acute on chronic systolic (congestive) heart failure: Secondary | ICD-10-CM | POA: Diagnosis not present

## 2017-06-15 DIAGNOSIS — I059 Rheumatic mitral valve disease, unspecified: Secondary | ICD-10-CM

## 2017-06-15 DIAGNOSIS — I4891 Unspecified atrial fibrillation: Secondary | ICD-10-CM

## 2017-06-15 LAB — PROTIME-INR: INR: 2.3 — AB (ref <=1.1)

## 2017-06-15 NOTE — Telephone Encounter (Signed)
Telephoned Otila Kluver, pts daughter. See Anticoagulant encounter.

## 2017-06-15 NOTE — Telephone Encounter (Signed)
[name] (patient daughter) calling, states that her father had INR checked today at [facility] wanted to make you aware so that you would have results. [name] would like to know, based off the results if there any changes that need to be made. Thanks.

## 2017-06-16 DIAGNOSIS — I2510 Atherosclerotic heart disease of native coronary artery without angina pectoris: Secondary | ICD-10-CM | POA: Diagnosis present

## 2017-06-16 DIAGNOSIS — I255 Ischemic cardiomyopathy: Secondary | ICD-10-CM | POA: Diagnosis present

## 2017-06-16 DIAGNOSIS — M25562 Pain in left knee: Secondary | ICD-10-CM | POA: Diagnosis not present

## 2017-06-16 DIAGNOSIS — I481 Persistent atrial fibrillation: Secondary | ICD-10-CM | POA: Diagnosis not present

## 2017-06-16 DIAGNOSIS — M109 Gout, unspecified: Secondary | ICD-10-CM | POA: Diagnosis not present

## 2017-06-16 DIAGNOSIS — E7800 Pure hypercholesterolemia, unspecified: Secondary | ICD-10-CM | POA: Diagnosis present

## 2017-06-16 DIAGNOSIS — I471 Supraventricular tachycardia: Secondary | ICD-10-CM | POA: Diagnosis present

## 2017-06-16 DIAGNOSIS — K921 Melena: Secondary | ICD-10-CM | POA: Diagnosis not present

## 2017-06-16 DIAGNOSIS — I959 Hypotension, unspecified: Secondary | ICD-10-CM | POA: Diagnosis not present

## 2017-06-16 DIAGNOSIS — R918 Other nonspecific abnormal finding of lung field: Secondary | ICD-10-CM | POA: Diagnosis not present

## 2017-06-16 DIAGNOSIS — I130 Hypertensive heart and chronic kidney disease with heart failure and stage 1 through stage 4 chronic kidney disease, or unspecified chronic kidney disease: Secondary | ICD-10-CM | POA: Diagnosis present

## 2017-06-16 DIAGNOSIS — B952 Enterococcus as the cause of diseases classified elsewhere: Secondary | ICD-10-CM | POA: Diagnosis present

## 2017-06-16 DIAGNOSIS — E1122 Type 2 diabetes mellitus with diabetic chronic kidney disease: Secondary | ICD-10-CM | POA: Diagnosis present

## 2017-06-16 DIAGNOSIS — M10062 Idiopathic gout, left knee: Secondary | ICD-10-CM | POA: Diagnosis not present

## 2017-06-16 DIAGNOSIS — I4891 Unspecified atrial fibrillation: Secondary | ICD-10-CM | POA: Diagnosis not present

## 2017-06-16 DIAGNOSIS — Z9981 Dependence on supplemental oxygen: Secondary | ICD-10-CM | POA: Diagnosis not present

## 2017-06-16 DIAGNOSIS — I10 Essential (primary) hypertension: Secondary | ICD-10-CM | POA: Diagnosis not present

## 2017-06-16 DIAGNOSIS — I350 Nonrheumatic aortic (valve) stenosis: Secondary | ICD-10-CM | POA: Diagnosis not present

## 2017-06-16 DIAGNOSIS — M25462 Effusion, left knee: Secondary | ICD-10-CM | POA: Diagnosis not present

## 2017-06-16 DIAGNOSIS — J9811 Atelectasis: Secondary | ICD-10-CM | POA: Diagnosis not present

## 2017-06-16 DIAGNOSIS — J811 Chronic pulmonary edema: Secondary | ICD-10-CM | POA: Diagnosis not present

## 2017-06-16 DIAGNOSIS — N179 Acute kidney failure, unspecified: Secondary | ICD-10-CM | POA: Diagnosis not present

## 2017-06-16 DIAGNOSIS — N183 Chronic kidney disease, stage 3 (moderate): Secondary | ICD-10-CM | POA: Diagnosis present

## 2017-06-16 DIAGNOSIS — I5023 Acute on chronic systolic (congestive) heart failure: Secondary | ICD-10-CM | POA: Diagnosis present

## 2017-06-16 DIAGNOSIS — I442 Atrioventricular block, complete: Secondary | ICD-10-CM | POA: Diagnosis present

## 2017-06-16 DIAGNOSIS — R7881 Bacteremia: Secondary | ICD-10-CM | POA: Diagnosis present

## 2017-06-16 DIAGNOSIS — T826XXA Infection and inflammatory reaction due to cardiac valve prosthesis, initial encounter: Secondary | ICD-10-CM | POA: Diagnosis present

## 2017-06-16 DIAGNOSIS — Z951 Presence of aortocoronary bypass graft: Secondary | ICD-10-CM | POA: Diagnosis not present

## 2017-06-16 DIAGNOSIS — I4892 Unspecified atrial flutter: Secondary | ICD-10-CM | POA: Diagnosis present

## 2017-06-16 DIAGNOSIS — I455 Other specified heart block: Secondary | ICD-10-CM | POA: Diagnosis not present

## 2017-06-16 DIAGNOSIS — I5022 Chronic systolic (congestive) heart failure: Secondary | ICD-10-CM | POA: Diagnosis not present

## 2017-06-16 DIAGNOSIS — E875 Hyperkalemia: Secondary | ICD-10-CM | POA: Diagnosis present

## 2017-06-16 DIAGNOSIS — I482 Chronic atrial fibrillation: Secondary | ICD-10-CM | POA: Diagnosis not present

## 2017-06-16 DIAGNOSIS — Z006 Encounter for examination for normal comparison and control in clinical research program: Secondary | ICD-10-CM | POA: Diagnosis not present

## 2017-06-16 DIAGNOSIS — J449 Chronic obstructive pulmonary disease, unspecified: Secondary | ICD-10-CM | POA: Diagnosis present

## 2017-06-16 DIAGNOSIS — Z7901 Long term (current) use of anticoagulants: Secondary | ICD-10-CM | POA: Diagnosis not present

## 2017-06-16 DIAGNOSIS — Z953 Presence of xenogenic heart valve: Secondary | ICD-10-CM | POA: Diagnosis not present

## 2017-06-16 DIAGNOSIS — I517 Cardiomegaly: Secondary | ICD-10-CM | POA: Diagnosis not present

## 2017-06-16 DIAGNOSIS — I5082 Biventricular heart failure: Secondary | ICD-10-CM | POA: Diagnosis present

## 2017-06-16 DIAGNOSIS — I38 Endocarditis, valve unspecified: Secondary | ICD-10-CM | POA: Diagnosis present

## 2017-07-06 ENCOUNTER — Other Ambulatory Visit
Admission: RE | Admit: 2017-07-06 | Discharge: 2017-07-06 | Disposition: A | Discharge status: Home / Self Care | Payer: Medicare Other | Source: Ambulatory Visit | Attending: Nurse Practitioner | Admitting: Nurse Practitioner

## 2017-07-06 ENCOUNTER — Ambulatory Visit (INDEPENDENT_AMBULATORY_CARE_PROVIDER_SITE_OTHER): Payer: Medicare Other

## 2017-07-06 DIAGNOSIS — Z9889 Other specified postprocedural states: Secondary | ICD-10-CM

## 2017-07-06 DIAGNOSIS — I059 Rheumatic mitral valve disease, unspecified: Secondary | ICD-10-CM | POA: Diagnosis not present

## 2017-07-06 DIAGNOSIS — Z5181 Encounter for therapeutic drug level monitoring: Secondary | ICD-10-CM

## 2017-07-06 DIAGNOSIS — I5023 Acute on chronic systolic (congestive) heart failure: Secondary | ICD-10-CM | POA: Diagnosis not present

## 2017-07-06 DIAGNOSIS — I4891 Unspecified atrial fibrillation: Secondary | ICD-10-CM

## 2017-07-06 LAB — BASIC METABOLIC PANEL WITH GFR
Anion gap: 12 (ref 5–15)
BUN: 66 mg/dL — ABNORMAL HIGH (ref 6–20)
CO2: 27 mmol/L (ref 22–32)
Calcium: 9 mg/dL (ref 8.9–10.3)
Chloride: 98 mmol/L — ABNORMAL LOW (ref 101–111)
Creatinine, Ser: 2.42 mg/dL — ABNORMAL HIGH (ref 0.61–1.24)
GFR calc Af Amer: 28 mL/min — ABNORMAL LOW
GFR calc non Af Amer: 24 mL/min — ABNORMAL LOW
Glucose, Bld: 85 mg/dL (ref 65–99)
Potassium: 4 mmol/L (ref 3.5–5.1)
Sodium: 137 mmol/L (ref 135–145)

## 2017-07-06 LAB — CBC
HCT: 31.3 % — ABNORMAL LOW (ref 40.0–52.0)
Hemoglobin: 10.5 g/dL — ABNORMAL LOW (ref 13.0–18.0)
MCH: 27.4 pg (ref 26.0–34.0)
MCHC: 33.5 g/dL (ref 32.0–36.0)
MCV: 81.7 fL (ref 80.0–100.0)
Platelets: 204 K/uL (ref 150–440)
RBC: 3.83 MIL/uL — ABNORMAL LOW (ref 4.40–5.90)
RDW: 20 % — ABNORMAL HIGH (ref 11.5–14.5)
WBC: 6.7 K/uL (ref 3.8–10.6)

## 2017-07-06 LAB — POCT INR: INR: 3

## 2017-07-06 LAB — MAGNESIUM: Magnesium: 1.9 mg/dL (ref 1.7–2.4)

## 2017-07-08 DIAGNOSIS — M545 Low back pain: Secondary | ICD-10-CM | POA: Diagnosis not present

## 2017-07-08 DIAGNOSIS — E1165 Type 2 diabetes mellitus with hyperglycemia: Secondary | ICD-10-CM | POA: Diagnosis not present

## 2017-07-08 DIAGNOSIS — Z794 Long term (current) use of insulin: Secondary | ICD-10-CM | POA: Diagnosis not present

## 2017-07-08 DIAGNOSIS — N183 Chronic kidney disease, stage 3 (moderate): Secondary | ICD-10-CM | POA: Diagnosis not present

## 2017-07-08 DIAGNOSIS — Z9981 Dependence on supplemental oxygen: Secondary | ICD-10-CM | POA: Diagnosis not present

## 2017-07-08 DIAGNOSIS — E1122 Type 2 diabetes mellitus with diabetic chronic kidney disease: Secondary | ICD-10-CM | POA: Diagnosis not present

## 2017-07-08 DIAGNOSIS — I2510 Atherosclerotic heart disease of native coronary artery without angina pectoris: Secondary | ICD-10-CM | POA: Diagnosis not present

## 2017-07-08 DIAGNOSIS — Z95810 Presence of automatic (implantable) cardiac defibrillator: Secondary | ICD-10-CM | POA: Diagnosis not present

## 2017-07-08 DIAGNOSIS — I130 Hypertensive heart and chronic kidney disease with heart failure and stage 1 through stage 4 chronic kidney disease, or unspecified chronic kidney disease: Secondary | ICD-10-CM | POA: Diagnosis not present

## 2017-07-08 DIAGNOSIS — Z951 Presence of aortocoronary bypass graft: Secondary | ICD-10-CM | POA: Diagnosis not present

## 2017-07-08 DIAGNOSIS — Z7901 Long term (current) use of anticoagulants: Secondary | ICD-10-CM | POA: Diagnosis not present

## 2017-07-08 DIAGNOSIS — M1009 Idiopathic gout, multiple sites: Secondary | ICD-10-CM | POA: Diagnosis not present

## 2017-07-08 DIAGNOSIS — I482 Chronic atrial fibrillation: Secondary | ICD-10-CM | POA: Diagnosis not present

## 2017-07-08 DIAGNOSIS — E1159 Type 2 diabetes mellitus with other circulatory complications: Secondary | ICD-10-CM | POA: Diagnosis not present

## 2017-07-08 DIAGNOSIS — Z952 Presence of prosthetic heart valve: Secondary | ICD-10-CM | POA: Diagnosis not present

## 2017-07-08 DIAGNOSIS — I5023 Acute on chronic systolic (congestive) heart failure: Secondary | ICD-10-CM | POA: Diagnosis not present

## 2017-07-10 ENCOUNTER — Telehealth: Payer: Self-pay

## 2017-07-10 ENCOUNTER — Ambulatory Visit (INDEPENDENT_AMBULATORY_CARE_PROVIDER_SITE_OTHER): Payer: Medicare Other

## 2017-07-10 DIAGNOSIS — I059 Rheumatic mitral valve disease, unspecified: Secondary | ICD-10-CM | POA: Diagnosis not present

## 2017-07-10 DIAGNOSIS — Z5181 Encounter for therapeutic drug level monitoring: Secondary | ICD-10-CM

## 2017-07-10 DIAGNOSIS — Z9889 Other specified postprocedural states: Secondary | ICD-10-CM | POA: Diagnosis not present

## 2017-07-10 DIAGNOSIS — I4891 Unspecified atrial fibrillation: Secondary | ICD-10-CM

## 2017-07-10 LAB — POCT INR: INR: 3.2

## 2017-07-10 NOTE — Telephone Encounter (Addendum)
Pt's daughter returned call stating pt newly prescribed imdur, hydralazine, metoprolol. Simvastatin dosage decreased during recent [facility] admission.  She understands that these medications do not affect INR but is concerned that pt received IV amiodarone during admission and how this will affect his INR.  She requests call back (ok to leave VM) with new coumadin dosage.  Pt INR 3.2 today. Dosed by Claiborne Billings, PharmD who also requests I ask daughter to call the coumadin clinic, [phone]. Left message on daughter's cell VM of medication changes and need to s/w coumadin clinic nurse. Provided their number as well as our office number if any questions about dosage.  S/w pt's wife (ok per DPR) of coumadin changes. She wrote them down and verbalized understanding. Informed wife of August 1, 10:45am coumadin appt. She asks if [place], Therapist, sports, will be in the office because "we don't want to see her". Advised wife that pt is scheduled to have coumadin checked by one of the [place]. She again asks if [facility] will be in the office and reiterated they do not even want to see her while here as there are issues with her. I restated that pt will have coumadin checked by [place] coumadin nurse. She verbalized understanding.  I have mailed a copy of changes to pt's home address on file.

## 2017-07-10 NOTE — Telephone Encounter (Signed)
Pt in office for coumadin check per daughter Tina's request. Pt states he is taking a new medication but can not remember who it is. He asks I call Otila Kluver for this information. Left message on her VM to call the office.

## 2017-07-13 DIAGNOSIS — M10072 Idiopathic gout, left ankle and foot: Secondary | ICD-10-CM | POA: Diagnosis not present

## 2017-07-13 DIAGNOSIS — B351 Tinea unguium: Secondary | ICD-10-CM | POA: Diagnosis not present

## 2017-07-13 DIAGNOSIS — E119 Type 2 diabetes mellitus without complications: Secondary | ICD-10-CM | POA: Diagnosis not present

## 2017-07-14 ENCOUNTER — Telehealth: Payer: Self-pay | Admitting: Pharmacist

## 2017-07-14 NOTE — Telephone Encounter (Signed)
Spoke with patient and advised that he will need to establish with cardiologist within [facility] to continue to have his anticoagulation managed through our clinic. He and wife state that he has been in hospital and he does not need a cardiologist because he is followed at [facility]. Advised that for him to remain active in our anticoagulation clinic it is a requirement that he sees a [facility] cardiologist at least once a year as they are responsible ultimately for his anticoagulation therapy. Also inquired if [facility] could manage his therapy since they are managing his other aspects of care. Wife states that he needs to be seen on Thursday for his INR and becomes upset when told that needs to see cardiologist or we cannot continue care. Advised he would have 30 days to make/keep appt before discharged from clinic. Attempted to have patient schedule appt today and wife declines and states he will make appt when he is in the office on Thursday. Advised that if appt not made will be given discharge letter to release from practice. She states understanding and that he will make appt.

## 2017-07-15 DIAGNOSIS — I2510 Atherosclerotic heart disease of native coronary artery without angina pectoris: Secondary | ICD-10-CM | POA: Diagnosis not present

## 2017-07-15 DIAGNOSIS — Z952 Presence of prosthetic heart valve: Secondary | ICD-10-CM | POA: Diagnosis not present

## 2017-07-15 DIAGNOSIS — I5022 Chronic systolic (congestive) heart failure: Secondary | ICD-10-CM | POA: Diagnosis not present

## 2017-07-15 DIAGNOSIS — Z7901 Long term (current) use of anticoagulants: Secondary | ICD-10-CM | POA: Diagnosis not present

## 2017-07-15 DIAGNOSIS — E7800 Pure hypercholesterolemia, unspecified: Secondary | ICD-10-CM | POA: Diagnosis not present

## 2017-07-15 DIAGNOSIS — I272 Pulmonary hypertension, unspecified: Secondary | ICD-10-CM | POA: Diagnosis not present

## 2017-07-15 DIAGNOSIS — I482 Chronic atrial fibrillation: Secondary | ICD-10-CM | POA: Diagnosis not present

## 2017-07-15 DIAGNOSIS — I10 Essential (primary) hypertension: Secondary | ICD-10-CM | POA: Diagnosis not present

## 2017-07-16 DIAGNOSIS — I2510 Atherosclerotic heart disease of native coronary artery without angina pectoris: Secondary | ICD-10-CM | POA: Diagnosis not present

## 2017-07-16 DIAGNOSIS — I482 Chronic atrial fibrillation: Secondary | ICD-10-CM | POA: Diagnosis not present

## 2017-07-16 DIAGNOSIS — N183 Chronic kidney disease, stage 3 (moderate): Secondary | ICD-10-CM | POA: Diagnosis not present

## 2017-07-16 DIAGNOSIS — I5023 Acute on chronic systolic (congestive) heart failure: Secondary | ICD-10-CM | POA: Diagnosis not present

## 2017-07-16 DIAGNOSIS — E1122 Type 2 diabetes mellitus with diabetic chronic kidney disease: Secondary | ICD-10-CM | POA: Diagnosis not present

## 2017-07-16 DIAGNOSIS — I130 Hypertensive heart and chronic kidney disease with heart failure and stage 1 through stage 4 chronic kidney disease, or unspecified chronic kidney disease: Secondary | ICD-10-CM | POA: Diagnosis not present

## 2017-07-21 DIAGNOSIS — I5023 Acute on chronic systolic (congestive) heart failure: Secondary | ICD-10-CM | POA: Diagnosis not present

## 2017-07-21 DIAGNOSIS — I130 Hypertensive heart and chronic kidney disease with heart failure and stage 1 through stage 4 chronic kidney disease, or unspecified chronic kidney disease: Secondary | ICD-10-CM | POA: Diagnosis not present

## 2017-07-21 DIAGNOSIS — N183 Chronic kidney disease, stage 3 (moderate): Secondary | ICD-10-CM | POA: Diagnosis not present

## 2017-07-21 DIAGNOSIS — I482 Chronic atrial fibrillation: Secondary | ICD-10-CM | POA: Diagnosis not present

## 2017-07-21 DIAGNOSIS — E1122 Type 2 diabetes mellitus with diabetic chronic kidney disease: Secondary | ICD-10-CM | POA: Diagnosis not present

## 2017-07-21 DIAGNOSIS — I2510 Atherosclerotic heart disease of native coronary artery without angina pectoris: Secondary | ICD-10-CM | POA: Diagnosis not present

## 2017-07-22 ENCOUNTER — Ambulatory Visit (INDEPENDENT_AMBULATORY_CARE_PROVIDER_SITE_OTHER): Payer: Medicare Other

## 2017-07-22 DIAGNOSIS — Z5181 Encounter for therapeutic drug level monitoring: Secondary | ICD-10-CM

## 2017-07-22 DIAGNOSIS — Z9889 Other specified postprocedural states: Secondary | ICD-10-CM

## 2017-07-22 DIAGNOSIS — I059 Rheumatic mitral valve disease, unspecified: Secondary | ICD-10-CM

## 2017-07-22 DIAGNOSIS — I4891 Unspecified atrial fibrillation: Secondary | ICD-10-CM | POA: Diagnosis not present

## 2017-07-22 LAB — POCT INR: INR: 3

## 2017-07-23 DIAGNOSIS — E1122 Type 2 diabetes mellitus with diabetic chronic kidney disease: Secondary | ICD-10-CM | POA: Diagnosis not present

## 2017-07-23 DIAGNOSIS — I2510 Atherosclerotic heart disease of native coronary artery without angina pectoris: Secondary | ICD-10-CM | POA: Diagnosis not present

## 2017-07-23 DIAGNOSIS — I5023 Acute on chronic systolic (congestive) heart failure: Secondary | ICD-10-CM | POA: Diagnosis not present

## 2017-07-23 DIAGNOSIS — I482 Chronic atrial fibrillation: Secondary | ICD-10-CM | POA: Diagnosis not present

## 2017-07-23 DIAGNOSIS — I130 Hypertensive heart and chronic kidney disease with heart failure and stage 1 through stage 4 chronic kidney disease, or unspecified chronic kidney disease: Secondary | ICD-10-CM | POA: Diagnosis not present

## 2017-07-23 DIAGNOSIS — N183 Chronic kidney disease, stage 3 (moderate): Secondary | ICD-10-CM | POA: Diagnosis not present

## 2017-07-28 DIAGNOSIS — I5023 Acute on chronic systolic (congestive) heart failure: Secondary | ICD-10-CM | POA: Diagnosis not present

## 2017-07-28 DIAGNOSIS — I2510 Atherosclerotic heart disease of native coronary artery without angina pectoris: Secondary | ICD-10-CM | POA: Diagnosis not present

## 2017-07-28 DIAGNOSIS — I130 Hypertensive heart and chronic kidney disease with heart failure and stage 1 through stage 4 chronic kidney disease, or unspecified chronic kidney disease: Secondary | ICD-10-CM | POA: Diagnosis not present

## 2017-07-28 DIAGNOSIS — N183 Chronic kidney disease, stage 3 (moderate): Secondary | ICD-10-CM | POA: Diagnosis not present

## 2017-07-28 DIAGNOSIS — E1122 Type 2 diabetes mellitus with diabetic chronic kidney disease: Secondary | ICD-10-CM | POA: Diagnosis not present

## 2017-07-28 DIAGNOSIS — I482 Chronic atrial fibrillation: Secondary | ICD-10-CM | POA: Diagnosis not present

## 2017-08-03 DIAGNOSIS — R300 Dysuria: Secondary | ICD-10-CM | POA: Diagnosis not present

## 2017-08-13 DIAGNOSIS — H401132 Primary open-angle glaucoma, bilateral, moderate stage: Secondary | ICD-10-CM | POA: Diagnosis not present

## 2017-08-30 DIAGNOSIS — R0602 Shortness of breath: Secondary | ICD-10-CM | POA: Diagnosis not present

## 2017-08-30 DIAGNOSIS — J449 Chronic obstructive pulmonary disease, unspecified: Secondary | ICD-10-CM | POA: Diagnosis present

## 2017-08-30 DIAGNOSIS — N189 Chronic kidney disease, unspecified: Secondary | ICD-10-CM | POA: Diagnosis present

## 2017-08-30 DIAGNOSIS — Z79899 Other long term (current) drug therapy: Secondary | ICD-10-CM | POA: Diagnosis not present

## 2017-08-30 DIAGNOSIS — N179 Acute kidney failure, unspecified: Secondary | ICD-10-CM | POA: Diagnosis not present

## 2017-08-30 DIAGNOSIS — Z95810 Presence of automatic (implantable) cardiac defibrillator: Secondary | ICD-10-CM | POA: Diagnosis not present

## 2017-08-30 DIAGNOSIS — R0600 Dyspnea, unspecified: Secondary | ICD-10-CM | POA: Diagnosis not present

## 2017-08-30 DIAGNOSIS — I482 Chronic atrial fibrillation: Secondary | ICD-10-CM | POA: Diagnosis not present

## 2017-08-30 DIAGNOSIS — E7800 Pure hypercholesterolemia, unspecified: Secondary | ICD-10-CM | POA: Diagnosis present

## 2017-08-30 DIAGNOSIS — I517 Cardiomegaly: Secondary | ICD-10-CM | POA: Diagnosis not present

## 2017-08-30 DIAGNOSIS — Z953 Presence of xenogenic heart valve: Secondary | ICD-10-CM | POA: Diagnosis not present

## 2017-08-30 DIAGNOSIS — Z7951 Long term (current) use of inhaled steroids: Secondary | ICD-10-CM | POA: Diagnosis not present

## 2017-08-30 DIAGNOSIS — Z87891 Personal history of nicotine dependence: Secondary | ICD-10-CM | POA: Diagnosis not present

## 2017-08-30 DIAGNOSIS — I130 Hypertensive heart and chronic kidney disease with heart failure and stage 1 through stage 4 chronic kidney disease, or unspecified chronic kidney disease: Secondary | ICD-10-CM | POA: Diagnosis not present

## 2017-08-30 DIAGNOSIS — Z794 Long term (current) use of insulin: Secondary | ICD-10-CM | POA: Diagnosis not present

## 2017-08-30 DIAGNOSIS — I2510 Atherosclerotic heart disease of native coronary artery without angina pectoris: Secondary | ICD-10-CM | POA: Diagnosis present

## 2017-08-30 DIAGNOSIS — Z951 Presence of aortocoronary bypass graft: Secondary | ICD-10-CM | POA: Diagnosis not present

## 2017-08-30 DIAGNOSIS — D509 Iron deficiency anemia, unspecified: Secondary | ICD-10-CM | POA: Diagnosis present

## 2017-08-30 DIAGNOSIS — R931 Abnormal findings on diagnostic imaging of heart and coronary circulation: Secondary | ICD-10-CM | POA: Diagnosis not present

## 2017-08-30 DIAGNOSIS — J811 Chronic pulmonary edema: Secondary | ICD-10-CM | POA: Diagnosis not present

## 2017-08-30 DIAGNOSIS — E1122 Type 2 diabetes mellitus with diabetic chronic kidney disease: Secondary | ICD-10-CM | POA: Diagnosis present

## 2017-08-30 DIAGNOSIS — I361 Nonrheumatic tricuspid (valve) insufficiency: Secondary | ICD-10-CM | POA: Diagnosis not present

## 2017-08-30 DIAGNOSIS — Z8679 Personal history of other diseases of the circulatory system: Secondary | ICD-10-CM | POA: Diagnosis not present

## 2017-08-30 DIAGNOSIS — Z952 Presence of prosthetic heart valve: Secondary | ICD-10-CM | POA: Diagnosis not present

## 2017-08-30 DIAGNOSIS — I5022 Chronic systolic (congestive) heart failure: Secondary | ICD-10-CM | POA: Diagnosis not present

## 2017-08-30 DIAGNOSIS — R635 Abnormal weight gain: Secondary | ICD-10-CM | POA: Diagnosis not present

## 2017-08-30 DIAGNOSIS — I272 Pulmonary hypertension, unspecified: Secondary | ICD-10-CM | POA: Diagnosis present

## 2017-08-30 DIAGNOSIS — Z9981 Dependence on supplemental oxygen: Secondary | ICD-10-CM | POA: Diagnosis not present

## 2017-08-30 DIAGNOSIS — I371 Nonrheumatic pulmonary valve insufficiency: Secondary | ICD-10-CM | POA: Diagnosis not present

## 2017-08-30 DIAGNOSIS — Z7901 Long term (current) use of anticoagulants: Secondary | ICD-10-CM | POA: Diagnosis not present

## 2017-08-30 DIAGNOSIS — Z9049 Acquired absence of other specified parts of digestive tract: Secondary | ICD-10-CM | POA: Diagnosis not present

## 2017-08-30 DIAGNOSIS — H409 Unspecified glaucoma: Secondary | ICD-10-CM | POA: Diagnosis present

## 2017-08-30 DIAGNOSIS — I255 Ischemic cardiomyopathy: Secondary | ICD-10-CM | POA: Diagnosis present

## 2017-08-30 DIAGNOSIS — I481 Persistent atrial fibrillation: Secondary | ICD-10-CM | POA: Diagnosis not present

## 2017-08-30 DIAGNOSIS — Z933 Colostomy status: Secondary | ICD-10-CM | POA: Diagnosis not present

## 2017-08-30 DIAGNOSIS — I5023 Acute on chronic systolic (congestive) heart failure: Secondary | ICD-10-CM | POA: Diagnosis present

## 2017-09-07 DIAGNOSIS — H353222 Exudative age-related macular degeneration, left eye, with inactive choroidal neovascularization: Secondary | ICD-10-CM | POA: Diagnosis not present

## 2017-09-08 DIAGNOSIS — I482 Chronic atrial fibrillation: Secondary | ICD-10-CM | POA: Diagnosis not present

## 2017-09-08 DIAGNOSIS — I5022 Chronic systolic (congestive) heart failure: Secondary | ICD-10-CM | POA: Diagnosis not present

## 2017-09-08 DIAGNOSIS — I10 Essential (primary) hypertension: Secondary | ICD-10-CM | POA: Diagnosis not present

## 2017-09-08 DIAGNOSIS — I2510 Atherosclerotic heart disease of native coronary artery without angina pectoris: Secondary | ICD-10-CM | POA: Diagnosis not present

## 2017-09-08 DIAGNOSIS — I350 Nonrheumatic aortic (valve) stenosis: Secondary | ICD-10-CM | POA: Diagnosis not present

## 2017-09-09 DIAGNOSIS — E1165 Type 2 diabetes mellitus with hyperglycemia: Secondary | ICD-10-CM | POA: Diagnosis not present

## 2017-09-16 DIAGNOSIS — Z794 Long term (current) use of insulin: Secondary | ICD-10-CM | POA: Diagnosis not present

## 2017-09-16 DIAGNOSIS — E1169 Type 2 diabetes mellitus with other specified complication: Secondary | ICD-10-CM | POA: Diagnosis not present

## 2017-09-16 DIAGNOSIS — E1122 Type 2 diabetes mellitus with diabetic chronic kidney disease: Secondary | ICD-10-CM | POA: Diagnosis not present

## 2017-09-16 DIAGNOSIS — E785 Hyperlipidemia, unspecified: Secondary | ICD-10-CM | POA: Diagnosis not present

## 2017-09-16 DIAGNOSIS — I10 Essential (primary) hypertension: Secondary | ICD-10-CM | POA: Diagnosis not present

## 2017-09-16 DIAGNOSIS — E1159 Type 2 diabetes mellitus with other circulatory complications: Secondary | ICD-10-CM | POA: Diagnosis not present

## 2017-09-16 DIAGNOSIS — N183 Chronic kidney disease, stage 3 (moderate): Secondary | ICD-10-CM | POA: Diagnosis not present

## 2017-09-21 ENCOUNTER — Emergency Department
Admission: EM | Admit: 2017-09-21 | Discharge: 2017-09-21 | Disposition: A | Discharge status: Other Acute Inpatient Hospital | Payer: Medicare Other | Attending: Emergency Medicine | Admitting: Emergency Medicine

## 2017-09-21 ENCOUNTER — Encounter: Payer: Self-pay | Admitting: Emergency Medicine

## 2017-09-21 DIAGNOSIS — Z87891 Personal history of nicotine dependence: Secondary | ICD-10-CM | POA: Insufficient documentation

## 2017-09-21 DIAGNOSIS — Z951 Presence of aortocoronary bypass graft: Secondary | ICD-10-CM | POA: Insufficient documentation

## 2017-09-21 DIAGNOSIS — N183 Chronic kidney disease, stage 3 (moderate): Secondary | ICD-10-CM | POA: Diagnosis present

## 2017-09-21 DIAGNOSIS — I5043 Acute on chronic combined systolic (congestive) and diastolic (congestive) heart failure: Secondary | ICD-10-CM | POA: Diagnosis not present

## 2017-09-21 DIAGNOSIS — J449 Chronic obstructive pulmonary disease, unspecified: Secondary | ICD-10-CM | POA: Insufficient documentation

## 2017-09-21 DIAGNOSIS — I5022 Chronic systolic (congestive) heart failure: Secondary | ICD-10-CM | POA: Diagnosis not present

## 2017-09-21 DIAGNOSIS — Z952 Presence of prosthetic heart valve: Secondary | ICD-10-CM | POA: Diagnosis not present

## 2017-09-21 DIAGNOSIS — I482 Chronic atrial fibrillation: Secondary | ICD-10-CM | POA: Diagnosis present

## 2017-09-21 DIAGNOSIS — R7989 Other specified abnormal findings of blood chemistry: Secondary | ICD-10-CM | POA: Diagnosis not present

## 2017-09-21 DIAGNOSIS — Z8546 Personal history of malignant neoplasm of prostate: Secondary | ICD-10-CM | POA: Insufficient documentation

## 2017-09-21 DIAGNOSIS — N179 Acute kidney failure, unspecified: Secondary | ICD-10-CM | POA: Diagnosis present

## 2017-09-21 DIAGNOSIS — Z79899 Other long term (current) drug therapy: Secondary | ICD-10-CM | POA: Insufficient documentation

## 2017-09-21 DIAGNOSIS — R531 Weakness: Secondary | ICD-10-CM | POA: Insufficient documentation

## 2017-09-21 DIAGNOSIS — Z9049 Acquired absence of other specified parts of digestive tract: Secondary | ICD-10-CM | POA: Diagnosis not present

## 2017-09-21 DIAGNOSIS — R0600 Dyspnea, unspecified: Secondary | ICD-10-CM | POA: Diagnosis not present

## 2017-09-21 DIAGNOSIS — R42 Dizziness and giddiness: Secondary | ICD-10-CM | POA: Diagnosis present

## 2017-09-21 DIAGNOSIS — I10 Essential (primary) hypertension: Secondary | ICD-10-CM | POA: Diagnosis not present

## 2017-09-21 DIAGNOSIS — E871 Hypo-osmolality and hyponatremia: Secondary | ICD-10-CM

## 2017-09-21 DIAGNOSIS — Z7982 Long term (current) use of aspirin: Secondary | ICD-10-CM | POA: Diagnosis not present

## 2017-09-21 DIAGNOSIS — Z23 Encounter for immunization: Secondary | ICD-10-CM | POA: Diagnosis not present

## 2017-09-21 DIAGNOSIS — Z794 Long term (current) use of insulin: Secondary | ICD-10-CM | POA: Diagnosis not present

## 2017-09-21 DIAGNOSIS — M10262 Drug-induced gout, left knee: Secondary | ICD-10-CM | POA: Diagnosis not present

## 2017-09-21 DIAGNOSIS — R570 Cardiogenic shock: Secondary | ICD-10-CM | POA: Diagnosis present

## 2017-09-21 DIAGNOSIS — I130 Hypertensive heart and chronic kidney disease with heart failure and stage 1 through stage 4 chronic kidney disease, or unspecified chronic kidney disease: Secondary | ICD-10-CM | POA: Diagnosis not present

## 2017-09-21 DIAGNOSIS — Z8709 Personal history of other diseases of the respiratory system: Secondary | ICD-10-CM | POA: Diagnosis not present

## 2017-09-21 DIAGNOSIS — E7800 Pure hypercholesterolemia, unspecified: Secondary | ICD-10-CM | POA: Diagnosis present

## 2017-09-21 DIAGNOSIS — Z95810 Presence of automatic (implantable) cardiac defibrillator: Secondary | ICD-10-CM | POA: Diagnosis not present

## 2017-09-21 DIAGNOSIS — E119 Type 2 diabetes mellitus without complications: Secondary | ICD-10-CM | POA: Diagnosis not present

## 2017-09-21 DIAGNOSIS — E876 Hypokalemia: Secondary | ICD-10-CM | POA: Diagnosis present

## 2017-09-21 DIAGNOSIS — I2729 Other secondary pulmonary hypertension: Secondary | ICD-10-CM | POA: Diagnosis not present

## 2017-09-21 DIAGNOSIS — Z9989 Dependence on other enabling machines and devices: Secondary | ICD-10-CM | POA: Diagnosis not present

## 2017-09-21 DIAGNOSIS — I110 Hypertensive heart disease with heart failure: Secondary | ICD-10-CM | POA: Insufficient documentation

## 2017-09-21 DIAGNOSIS — E878 Other disorders of electrolyte and fluid balance, not elsewhere classified: Secondary | ICD-10-CM | POA: Diagnosis not present

## 2017-09-21 DIAGNOSIS — Z953 Presence of xenogenic heart valve: Secondary | ICD-10-CM | POA: Diagnosis not present

## 2017-09-21 DIAGNOSIS — I5023 Acute on chronic systolic (congestive) heart failure: Secondary | ICD-10-CM | POA: Diagnosis present

## 2017-09-21 DIAGNOSIS — E1122 Type 2 diabetes mellitus with diabetic chronic kidney disease: Secondary | ICD-10-CM | POA: Diagnosis present

## 2017-09-21 DIAGNOSIS — R918 Other nonspecific abnormal finding of lung field: Secondary | ICD-10-CM | POA: Diagnosis not present

## 2017-09-21 DIAGNOSIS — Z452 Encounter for adjustment and management of vascular access device: Secondary | ICD-10-CM | POA: Diagnosis not present

## 2017-09-21 DIAGNOSIS — I152 Hypertension secondary to endocrine disorders: Secondary | ICD-10-CM | POA: Diagnosis present

## 2017-09-21 DIAGNOSIS — E1169 Type 2 diabetes mellitus with other specified complication: Secondary | ICD-10-CM | POA: Diagnosis present

## 2017-09-21 DIAGNOSIS — Z9981 Dependence on supplemental oxygen: Secondary | ICD-10-CM | POA: Diagnosis not present

## 2017-09-21 DIAGNOSIS — I2510 Atherosclerotic heart disease of native coronary artery without angina pectoris: Secondary | ICD-10-CM | POA: Diagnosis present

## 2017-09-21 DIAGNOSIS — Z7901 Long term (current) use of anticoagulants: Secondary | ICD-10-CM | POA: Diagnosis not present

## 2017-09-21 DIAGNOSIS — E1165 Type 2 diabetes mellitus with hyperglycemia: Secondary | ICD-10-CM | POA: Diagnosis present

## 2017-09-21 DIAGNOSIS — I350 Nonrheumatic aortic (valve) stenosis: Secondary | ICD-10-CM | POA: Diagnosis not present

## 2017-09-21 DIAGNOSIS — Z9911 Dependence on respirator [ventilator] status: Secondary | ICD-10-CM | POA: Diagnosis not present

## 2017-09-21 DIAGNOSIS — I272 Pulmonary hypertension, unspecified: Secondary | ICD-10-CM | POA: Diagnosis present

## 2017-09-21 DIAGNOSIS — E785 Hyperlipidemia, unspecified: Secondary | ICD-10-CM | POA: Diagnosis present

## 2017-09-21 DIAGNOSIS — I517 Cardiomegaly: Secondary | ICD-10-CM | POA: Diagnosis not present

## 2017-09-21 LAB — BASIC METABOLIC PANEL
Anion gap: 15 (ref 5–15)
BUN: 67 mg/dL — AB (ref 6–20)
CHLORIDE: 72 mmol/L — AB (ref 101–111)
CO2: 32 mmol/L (ref 22–32)
CREATININE: 2.26 mg/dL — AB (ref 0.61–1.24)
Calcium: 8.9 mg/dL (ref 8.9–10.3)
GFR calc non Af Amer: 26 mL/min — ABNORMAL LOW (ref >60)
GFR, EST AFRICAN AMERICAN: 30 mL/min — AB (ref >60)
GLUCOSE: 199 mg/dL — AB (ref 65–99)
Potassium: 2.5 mmol/L — CL (ref 3.5–5.1)
Sodium: 119 mmol/L — CL (ref 135–145)

## 2017-09-21 LAB — CBC
HEMATOCRIT: 36.4 % — AB (ref 40.0–52.0)
HEMOGLOBIN: 12.4 g/dL — AB (ref 13.0–18.0)
MCH: 25.5 pg — AB (ref 26.0–34.0)
MCHC: 34.2 g/dL (ref 32.0–36.0)
MCV: 74.6 fL — ABNORMAL LOW (ref 80.0–100.0)
Platelets: 162 10*3/uL (ref 150–440)
RBC: 4.88 MIL/uL (ref 4.40–5.90)
RDW: 22.9 % — AB (ref 11.5–14.5)
WBC: 6.3 10*3/uL (ref 3.8–10.6)

## 2017-09-21 LAB — MAGNESIUM: MAGNESIUM: 2.2 mg/dL (ref 1.7–2.4)

## 2017-09-21 LAB — PROTIME-INR
INR: 1.91
Prothrombin Time: 21.7 seconds — ABNORMAL HIGH (ref 11.4–15.2)

## 2017-09-21 LAB — TROPONIN I: Troponin I: 0.11 ng/mL (ref <0.03)

## 2017-09-21 LAB — PHOSPHORUS: PHOSPHORUS: 4.4 mg/dL (ref 2.5–4.6)

## 2017-09-21 LAB — APTT: aPTT: 41 seconds — ABNORMAL HIGH (ref 24–36)

## 2017-09-21 MED ORDER — POTASSIUM CHLORIDE IN NACL 20-0.9 MEQ/L-% IV SOLN
Freq: Once | INTRAVENOUS | Status: AC
  Administered 2017-09-21: 17:00:00 via INTRAVENOUS
  Filled 2017-09-21: qty 1000

## 2017-09-21 NOTE — ED Notes (Signed)
EMTALA checked for completion  

## 2017-09-21 NOTE — ED Triage Notes (Signed)
Had outpatient blood work today.  Sent to ED due to potassium 2.4.

## 2017-09-21 NOTE — ED Notes (Signed)
EMS left with patient to [facility]

## 2017-09-21 NOTE — ED Provider Notes (Signed)
[facility] Emergency Department Provider Note  Time seen: 3:56 PM  I have reviewed the triage vital signs and the nursing notes.   HISTORY  Chief Complaint abnormal labs    HPI [name] is a 76 y.o. male With a past medical history of atrial fibrillation on warfarin, CHF, COPD, diabetes, hypertension, hyperlipidemia, presents to the emergency department for a low potassium level. According to the patient and family patient has been having trouble with fluid retention, approximately one week ago was put on metolazone after they have increased the patient's torsemide multiple times. Patient had labs performed showing his potassium is decreased at 3.2 so they discontinued the metolazone approximately for 5 days ago. Patient states over the past several days he has been feeling weak and dizzy especially upon standing had lab work performed and was called today saying his potassium was 2.4 and for the patient to go to the emergency department. Once here in the patient's labs were reviewed and his sodium had resulted at 120. Overall the patient states generalized fatigue and weakness but states this is ongoing since July when he had a pacemaker placed at [facility]. He does state over the past 2-3 days he has had dizziness especially upon standing.  Past Medical History:  Diagnosis Date  . Adenomatous polyps   . Chronic atrial fibrillation (HCC)    a. warfarin; b. CHADSVASc 6 (CHF, HTN, age x 2, DM, vascular disease) giving him an estimated annual stroke risk of 9.8%  . Chronic systolic CHF (congestive heart failure) ([place])    a. echo 10/2014: EF 30-35%, AK of inf & post myocardium, HK of apical & lat wall, ant & antsept wall best preserved, mitral: mechanical prosthesis was present & well seated, LA moderately dilated, RV systolic fxn mildly reduced, PASP nl  . COPD (chronic obstructive pulmonary disease) ([place])    URI 9/12  . Coronary artery disease    Cath 2005-  patent LIMA-LAD, SVG-OM grafts, moderate MR  . Diverticulosis    left side   . DM type 2 (diabetes mellitus, type 2) ([place])   . Glaucoma   . Gout   . H/O mitral valve prolapse   . Hard of hearing   . Hyperkalemia   . Hyperlipidemia   . Hypertension   . Myocardial infarction ([place])   . PNA (pneumonia) 03/2010  . Prostate cancer ([place]) 04/2009  . Sigmoid diverticulitis    perforated    Patient Active Problem List   Diagnosis Date Noted  . COPD exacerbation ([place]) 05/28/2016  . Acute on chronic systolic (congestive) heart failure ([place]) 11/20/2015  . Aortic stenosis 11/20/2015  . Encounter for therapeutic drug monitoring 02/10/2014  . Bradycardia 10/28/2013  . Aspiration into respiratory tract 03/14/2013  . Mediastinal lymphadenopathy 03/01/2013  . Pneumonia 11/23/2012  . Chronic respiratory failure with hypoxia ([place]) 11/09/2012  . Preoperative cardiovascular examination 09/14/2012  . Personal history of adenomatous colonic polyps 04/30/2012  . 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 COPD (chronic obstructive pulmonary disease) ([place]) 09/15/2011  . Mitral valve disorder 03/11/2011  . Long term current use of anticoagulant 03/11/2011  . MITRAL VALVE REPLACEMENT, HX OF 07/29/2010  . Diabetes mellitus type 2 with complications ([place]) 02/77/4128  . Hyperlipidemia 01/25/2010  . Essential hypertension 05/01/2009  . CAD, ARTERY BYPASS GRAFT 05/01/2009  . Atrial fibrillation ([place]) 05/01/2009  . Chronic systolic heart failure ([place]) 05/01/2009    Past Surgical History:  Procedure Laterality Date  . CATARACT EXTRACTION    . COLON RESECTION  01/12/2012   sigmoid colectomy and colostomy for diverticulitis  . COLONOSCOPY  11/23/2008  . CORONARY ARTERY BYPASS GRAFT  1989   LIMA-LAD, SVG-OM  . EXTERNAL EAR SURGERY     drum  . HERNIA REPAIR    . MITRAL VALVE REPLACEMENT  2007   Mechanical, performed at [facility]    Prior to  Admission medications   Medication Sig Start Date End Date Taking? Authorizing Provider  albuterol (PROVENTIL HFA;VENTOLIN HFA) 108 (90 Base) MCG/ACT inhaler Inhale 2 puffs into the lungs every 6 (six) hours as needed for wheezing or shortness of breath. 02/12/17   Juanito Doom, MD  albuterol (PROVENTIL) (2.5 MG/3ML) 0.083% nebulizer solution Take 3 mLs (2.5 mg total) by nebulization every 6 (six) hours as needed for wheezing or shortness of breath. 02/16/17   Juanito Doom, MD  aspirin EC 81 MG tablet Take 1 tablet (81 mg total) by mouth daily. 11/24/15   Dunn, Nedra Hai, PA-C  beta carotene w/minerals (OCUVITE) tablet Take 1 tablet by mouth daily.    [provider]  budesonide-formoterol (SYMBICORT) 160-4.5 MCG/ACT inhaler Inhale 2 puffs into the lungs 2 (two) times daily. 07/10/16   Juanito Doom, MD  budesonide-formoterol (SYMBICORT) 160-4.5 MCG/ACT inhaler Inhale 2 puffs into the lungs 2 (two) times daily. 11/20/16   Juanito Doom, MD  budesonide-formoterol (SYMBICORT) 160-4.5 MCG/ACT inhaler Inhale 2 puffs into the lungs 2 (two) times daily. 02/12/17 02/13/17  Juanito Doom, MD  Cholecalciferol (VITAMIN D) 2000 UNITS tablet Take 2,000 Units by mouth daily.    [provider]  digoxin (DIGOX) 0.125 MG tablet Take 1 tablet by mouth  daily 11/19/15   Minna Merritts, MD  HYDROcodone-acetaminophen (NORCO/VICODIN) 5-325 MG tablet Take 1-2 tabs every 4-6 hrs PRN pain 01/07/16   [provider]  latanoprost (XALATAN) 0.005 % ophthalmic solution Place 1 drop into both eyes at bedtime.  02/14/15   [provider]  levofloxacin (LEVAQUIN) 500 MG tablet Take 1 tablet (500 mg total) by mouth daily. 02/12/17   Juanito Doom, MD  losartan (COZAAR) 100 MG tablet Take 25 mg by mouth daily.     [provider]  metoprolol succinate (TOPROL-XL) 50 MG 24 hr tablet Take 50 mg by mouth daily. Take with or immediately following a meal.    [provider]  OXYGEN o2 2 to 3lpm 24/7 [facility]    [provider]  simvastatin (ZOCOR) 40 MG tablet Take 40 mg by mouth at bedtime.     [provider]  SPIRIVA RESPIMAT 2.5 MCG/ACT AERS USE 2 PUFFS DAILY 02/23/17   Juanito Doom, MD  Tamsulosin HCl (FLOMAX) 0.4 MG CAPS Take 0.4 mg by mouth daily.  07/30/11   [provider]  Tiotropium Bromide Monohydrate (SPIRIVA RESPIMAT) 2.5 MCG/ACT AERS Inhale 2 puffs into the lungs daily. 11/20/16   Juanito Doom, MD  torsemide (DEMADEX) 20 MG tablet Take 40 mg by mouth 2 (two) times daily. As needed    [provider]  warfarin (COUMADIN) 2.5 MG tablet TAKE AS DIRECTED BY  [facility] 11/04/16   Minna Merritts, MD    Allergies  Allergen Reactions  . Acetaminophen-Codeine Hives  . Altace [Ramipril] Other (See Comments)    Other reaction(s): Weal  . Codeine Hives  . Sulfonamide Derivatives Hives  . Sulfa Antibiotics Rash    Other reaction(s): Weal  Family History  Problem Relation Age of Onset  . Coronary artery disease Mother   . Heart attack Mother        x10  . Heart disease Mother   . Stroke Father   . Heart disease Father   . Heart attack Brother        with bypass   . Heart attack Sister        with bypass   . Cancer Sister        bone  . Pancreatic cancer Brother        pancreatic  . Heart disease Maternal Grandmother   . Heart disease Maternal Grandfather   . Leukemia Sister   . Cancer Maternal Aunt        ? type    Social History Social History  Substance Use Topics  . Smoking status: Former Smoker    Packs/day: 4.00    Years: 23.00    Types: Cigarettes    Quit date: 12/23/1987  . Smokeless tobacco: Never Used  . Alcohol use No    Review of Systems Constitutional: Negative for fever. Cardiovascular: Negative for chest pain. Respiratory: Negative for shortness of breath. Gastrointestinal: Negative for abdominal pain Musculoskeletal: Negative for back  pain. Neurological: Negative for headache All other ROS negative  ____________________________________________   PHYSICAL EXAM:  VITAL SIGNS: ED Triage Vitals  Enc Vitals Group     BP 09/21/17 1533 (!) 85/46     Pulse Rate 09/21/17 1533 86     Resp 09/21/17 1533 16     Temp 09/21/17 1533 97.6 F (36.4 C)     Temp Source 09/21/17 1533 Oral     SpO2 09/21/17 1533 98 %     Weight 09/21/17 1520 143 lb 12.8 oz (65.2 kg)     Height 09/21/17 1520 5\' 4"  (1.626 m)     Head Circumference --      Peak Flow --      Pain Score --      Pain Loc --      Pain Edu? --      Excl. in [place]? --     Constitutional: Alert and oriented. Well appearing and in no distress. Eyes: Normal exam ENT   Head: Normocephalic and atraumatic   Mouth/Throat: Mucous membranes are moist. Cardiovascular: Normal rate, regular rhythm.  Respiratory: Normal respiratory effort without tachypnea nor retractions. Breath sounds are clear Gastrointestinal: Soft and nontender. No distention.  Musculoskeletal: Nontender with normal range of motion in all extremities. No lower extremity tenderness or edema. Neurologic:  Normal speech and language. No gross focal neurologic deficits Skin:  Skin is warm, dry and intact.  Psychiatric: Mood and affect are normal. ____________________________________________    EKG  EKG reviewed and interpreted by myself shows ventricular paced rhythm at 94 bpm, widened QRS, nonspecific ST changes.   INITIAL IMPRESSION / ASSESSMENT AND PLAN / ED COURSE  Pertinent labs & imaging results that were available during my care of the patient were reviewed by me and considered in my medical decision making (see chart for details).  the patient presents to the emergency department for lab abnormality. Patient states dizziness, worse upon standing. Differential this time would include electrolyte abnormality such as hypokalemia, hyponatremia, dehydration, symptomatic anemia, worsening CHF/EF.  We will check labs, begin potassium supplementation with IV fluids including normal saline while awaiting lab results.patient's initial blood pressure in the emergency department is 85/46. Family states baseline is around 397 systolic. All of the patient's care  is at [facility]. Patient states if he needs to be admitted he would prefer transfer to [facility].  patient's labs are resulted with a potassium of 2.5 and a sodium of 119. We will begin IV repletion. Patient will likely be transferred to [facility] for further treatment. Currently awaiting cardiology callback from [facility] for transfer.  I discussed the patient with the cardiology they're accepting to her heart failure service under Dr. Lodema Hong.  currently awaiting bed availability.  cardiology fellow agrees with continued IV potassium and normal saline, but states to stop after 1 L is infused. Currently receiving 200 mL per hour.  ____________________________________________   FINAL CLINICAL IMPRESSION(S) / ED DIAGNOSES  hyponatremia Hypokalemia Dehydration    Harvest Dark, MD 09/21/17 [phone]

## 2017-10-09 DIAGNOSIS — I5022 Chronic systolic (congestive) heart failure: Secondary | ICD-10-CM | POA: Diagnosis not present

## 2017-10-12 DIAGNOSIS — M10072 Idiopathic gout, left ankle and foot: Secondary | ICD-10-CM | POA: Diagnosis not present

## 2017-10-14 DIAGNOSIS — Z951 Presence of aortocoronary bypass graft: Secondary | ICD-10-CM | POA: Diagnosis not present

## 2017-10-14 DIAGNOSIS — M10072 Idiopathic gout, left ankle and foot: Secondary | ICD-10-CM | POA: Diagnosis present

## 2017-10-14 DIAGNOSIS — R791 Abnormal coagulation profile: Secondary | ICD-10-CM | POA: Diagnosis present

## 2017-10-14 DIAGNOSIS — Z794 Long term (current) use of insulin: Secondary | ICD-10-CM | POA: Diagnosis not present

## 2017-10-14 DIAGNOSIS — I5023 Acute on chronic systolic (congestive) heart failure: Secondary | ICD-10-CM | POA: Diagnosis present

## 2017-10-14 DIAGNOSIS — Z87891 Personal history of nicotine dependence: Secondary | ICD-10-CM | POA: Diagnosis not present

## 2017-10-14 DIAGNOSIS — Z7901 Long term (current) use of anticoagulants: Secondary | ICD-10-CM | POA: Diagnosis not present

## 2017-10-14 DIAGNOSIS — Z7951 Long term (current) use of inhaled steroids: Secondary | ICD-10-CM | POA: Diagnosis not present

## 2017-10-14 DIAGNOSIS — E7800 Pure hypercholesterolemia, unspecified: Secondary | ICD-10-CM | POA: Diagnosis present

## 2017-10-14 DIAGNOSIS — N183 Chronic kidney disease, stage 3 (moderate): Secondary | ICD-10-CM | POA: Diagnosis present

## 2017-10-14 DIAGNOSIS — I2510 Atherosclerotic heart disease of native coronary artery without angina pectoris: Secondary | ICD-10-CM | POA: Diagnosis present

## 2017-10-14 DIAGNOSIS — Z8709 Personal history of other diseases of the respiratory system: Secondary | ICD-10-CM | POA: Diagnosis not present

## 2017-10-14 DIAGNOSIS — E785 Hyperlipidemia, unspecified: Secondary | ICD-10-CM | POA: Diagnosis present

## 2017-10-14 DIAGNOSIS — Z9981 Dependence on supplemental oxygen: Secondary | ICD-10-CM | POA: Diagnosis not present

## 2017-10-14 DIAGNOSIS — I272 Pulmonary hypertension, unspecified: Secondary | ICD-10-CM | POA: Diagnosis present

## 2017-10-14 DIAGNOSIS — E876 Hypokalemia: Secondary | ICD-10-CM | POA: Diagnosis not present

## 2017-10-14 DIAGNOSIS — I130 Hypertensive heart and chronic kidney disease with heart failure and stage 1 through stage 4 chronic kidney disease, or unspecified chronic kidney disease: Secondary | ICD-10-CM | POA: Diagnosis present

## 2017-10-14 DIAGNOSIS — J9811 Atelectasis: Secondary | ICD-10-CM | POA: Diagnosis not present

## 2017-10-14 DIAGNOSIS — I152 Hypertension secondary to endocrine disorders: Secondary | ICD-10-CM | POA: Diagnosis present

## 2017-10-14 DIAGNOSIS — R918 Other nonspecific abnormal finding of lung field: Secondary | ICD-10-CM | POA: Diagnosis not present

## 2017-10-14 DIAGNOSIS — I5043 Acute on chronic combined systolic (congestive) and diastolic (congestive) heart failure: Secondary | ICD-10-CM | POA: Diagnosis not present

## 2017-10-14 DIAGNOSIS — E1122 Type 2 diabetes mellitus with diabetic chronic kidney disease: Secondary | ICD-10-CM | POA: Diagnosis present

## 2017-10-14 DIAGNOSIS — E871 Hypo-osmolality and hyponatremia: Secondary | ICD-10-CM | POA: Diagnosis present

## 2017-10-14 DIAGNOSIS — I482 Chronic atrial fibrillation: Secondary | ICD-10-CM | POA: Diagnosis present

## 2017-10-14 DIAGNOSIS — Z953 Presence of xenogenic heart valve: Secondary | ICD-10-CM | POA: Diagnosis not present

## 2017-10-14 DIAGNOSIS — T502X5A Adverse effect of carbonic-anhydrase inhibitors, benzothiadiazides and other diuretics, initial encounter: Secondary | ICD-10-CM | POA: Diagnosis not present

## 2017-10-14 DIAGNOSIS — E1165 Type 2 diabetes mellitus with hyperglycemia: Secondary | ICD-10-CM | POA: Diagnosis present

## 2017-10-14 DIAGNOSIS — E1169 Type 2 diabetes mellitus with other specified complication: Secondary | ICD-10-CM | POA: Diagnosis present

## 2017-10-14 DIAGNOSIS — Z79899 Other long term (current) drug therapy: Secondary | ICD-10-CM | POA: Diagnosis not present

## 2017-10-14 DIAGNOSIS — J449 Chronic obstructive pulmonary disease, unspecified: Secondary | ICD-10-CM | POA: Diagnosis present

## 2017-10-14 DIAGNOSIS — Z9889 Other specified postprocedural states: Secondary | ICD-10-CM | POA: Diagnosis not present

## 2017-10-14 DIAGNOSIS — I255 Ischemic cardiomyopathy: Secondary | ICD-10-CM | POA: Diagnosis present

## 2017-10-23 DIAGNOSIS — I5022 Chronic systolic (congestive) heart failure: Secondary | ICD-10-CM | POA: Diagnosis not present

## 2017-10-26 ENCOUNTER — Telehealth: Payer: Self-pay | Admitting: Pulmonary Disease

## 2017-10-26 DIAGNOSIS — I5022 Chronic systolic (congestive) heart failure: Secondary | ICD-10-CM | POA: Diagnosis not present

## 2017-10-26 NOTE — Telephone Encounter (Signed)
Spoke with pt's spouse Raquel Sarna, who states pt needing to re qualify for oxygen. Raquel Sarna states she was under the impression that a form would be sent to [facility] to fill out to qualify pt, due to pt's recurrent admissions and pt not being very mobile.  I have spoken with [name] with [facility], who states he will pull a hospital note from 08/30/17 in hopes that this will qualify pt. [name] states there are no notes within pt's chart in regards to the qualifying form.  [name] suggested that we schedule pt to do proper testing and for OV, and cancel if hosp note will work.     Pt has been scheduled for qualifying and OV on 10/28/17. Nothing further needed at this time.

## 2017-10-27 ENCOUNTER — Telehealth: Payer: Self-pay | Admitting: Pulmonary Disease

## 2017-10-27 DIAGNOSIS — J9611 Chronic respiratory failure with hypoxia: Secondary | ICD-10-CM

## 2017-10-27 DIAGNOSIS — H16122 Filamentary keratitis, left eye: Secondary | ICD-10-CM | POA: Diagnosis not present

## 2017-10-27 NOTE — Telephone Encounter (Signed)
Order has been placed for 2L O2 cont. [name] with [facility] has been made aware.  Nothing further needed.

## 2017-10-28 ENCOUNTER — Ambulatory Visit: Payer: Medicare Other | Admitting: Pulmonary Disease

## 2017-10-29 DIAGNOSIS — I10 Essential (primary) hypertension: Secondary | ICD-10-CM | POA: Diagnosis not present

## 2017-10-29 DIAGNOSIS — E785 Hyperlipidemia, unspecified: Secondary | ICD-10-CM | POA: Diagnosis not present

## 2017-10-29 DIAGNOSIS — Z794 Long term (current) use of insulin: Secondary | ICD-10-CM | POA: Diagnosis not present

## 2017-10-29 DIAGNOSIS — E1122 Type 2 diabetes mellitus with diabetic chronic kidney disease: Secondary | ICD-10-CM | POA: Diagnosis not present

## 2017-10-29 DIAGNOSIS — I5023 Acute on chronic systolic (congestive) heart failure: Secondary | ICD-10-CM | POA: Diagnosis not present

## 2017-10-29 DIAGNOSIS — N183 Chronic kidney disease, stage 3 (moderate): Secondary | ICD-10-CM | POA: Diagnosis not present

## 2017-10-29 DIAGNOSIS — E1159 Type 2 diabetes mellitus with other circulatory complications: Secondary | ICD-10-CM | POA: Diagnosis not present

## 2017-10-29 DIAGNOSIS — I272 Pulmonary hypertension, unspecified: Secondary | ICD-10-CM | POA: Diagnosis not present

## 2017-10-29 DIAGNOSIS — E1169 Type 2 diabetes mellitus with other specified complication: Secondary | ICD-10-CM | POA: Diagnosis not present

## 2017-11-02 DIAGNOSIS — I5022 Chronic systolic (congestive) heart failure: Secondary | ICD-10-CM | POA: Diagnosis not present

## 2017-11-04 DIAGNOSIS — H16122 Filamentary keratitis, left eye: Secondary | ICD-10-CM | POA: Diagnosis not present

## 2017-11-09 DIAGNOSIS — Z79899 Other long term (current) drug therapy: Secondary | ICD-10-CM | POA: Diagnosis not present

## 2017-11-09 DIAGNOSIS — Z5181 Encounter for therapeutic drug level monitoring: Secondary | ICD-10-CM | POA: Diagnosis not present

## 2017-11-11 DIAGNOSIS — H16122 Filamentary keratitis, left eye: Secondary | ICD-10-CM | POA: Diagnosis not present

## 2017-11-13 DIAGNOSIS — G8929 Other chronic pain: Secondary | ICD-10-CM | POA: Diagnosis present

## 2017-11-13 DIAGNOSIS — Z8546 Personal history of malignant neoplasm of prostate: Secondary | ICD-10-CM | POA: Diagnosis not present

## 2017-11-13 DIAGNOSIS — Z7901 Long term (current) use of anticoagulants: Secondary | ICD-10-CM | POA: Diagnosis not present

## 2017-11-13 DIAGNOSIS — R042 Hemoptysis: Secondary | ICD-10-CM | POA: Diagnosis not present

## 2017-11-13 DIAGNOSIS — J440 Chronic obstructive pulmonary disease with acute lower respiratory infection: Secondary | ICD-10-CM | POA: Diagnosis not present

## 2017-11-13 DIAGNOSIS — I255 Ischemic cardiomyopathy: Secondary | ICD-10-CM | POA: Diagnosis present

## 2017-11-13 DIAGNOSIS — R6883 Chills (without fever): Secondary | ICD-10-CM | POA: Diagnosis not present

## 2017-11-13 DIAGNOSIS — R011 Cardiac murmur, unspecified: Secondary | ICD-10-CM | POA: Diagnosis not present

## 2017-11-13 DIAGNOSIS — I5023 Acute on chronic systolic (congestive) heart failure: Secondary | ICD-10-CM | POA: Diagnosis not present

## 2017-11-13 DIAGNOSIS — R000 Tachycardia, unspecified: Secondary | ICD-10-CM | POA: Diagnosis not present

## 2017-11-13 DIAGNOSIS — N400 Enlarged prostate without lower urinary tract symptoms: Secondary | ICD-10-CM | POA: Diagnosis present

## 2017-11-13 DIAGNOSIS — E1122 Type 2 diabetes mellitus with diabetic chronic kidney disease: Secondary | ICD-10-CM | POA: Diagnosis present

## 2017-11-13 DIAGNOSIS — I482 Chronic atrial fibrillation: Secondary | ICD-10-CM | POA: Diagnosis present

## 2017-11-13 DIAGNOSIS — J189 Pneumonia, unspecified organism: Secondary | ICD-10-CM | POA: Diagnosis not present

## 2017-11-13 DIAGNOSIS — I959 Hypotension, unspecified: Secondary | ICD-10-CM | POA: Diagnosis not present

## 2017-11-13 DIAGNOSIS — D72829 Elevated white blood cell count, unspecified: Secondary | ICD-10-CM | POA: Diagnosis not present

## 2017-11-13 DIAGNOSIS — N183 Chronic kidney disease, stage 3 (moderate): Secondary | ICD-10-CM | POA: Diagnosis present

## 2017-11-13 DIAGNOSIS — Z933 Colostomy status: Secondary | ICD-10-CM | POA: Diagnosis not present

## 2017-11-13 DIAGNOSIS — E785 Hyperlipidemia, unspecified: Secondary | ICD-10-CM | POA: Diagnosis present

## 2017-11-13 DIAGNOSIS — H409 Unspecified glaucoma: Secondary | ICD-10-CM | POA: Diagnosis present

## 2017-11-13 DIAGNOSIS — R931 Abnormal findings on diagnostic imaging of heart and coronary circulation: Secondary | ICD-10-CM | POA: Diagnosis not present

## 2017-11-13 DIAGNOSIS — I2510 Atherosclerotic heart disease of native coronary artery without angina pectoris: Secondary | ICD-10-CM | POA: Diagnosis present

## 2017-11-13 DIAGNOSIS — Z86010 Personal history of colonic polyps: Secondary | ICD-10-CM | POA: Diagnosis not present

## 2017-11-13 DIAGNOSIS — J181 Lobar pneumonia, unspecified organism: Secondary | ICD-10-CM | POA: Diagnosis not present

## 2017-11-13 DIAGNOSIS — K219 Gastro-esophageal reflux disease without esophagitis: Secondary | ICD-10-CM | POA: Diagnosis present

## 2017-11-13 DIAGNOSIS — I481 Persistent atrial fibrillation: Secondary | ICD-10-CM | POA: Diagnosis not present

## 2017-11-13 DIAGNOSIS — Z952 Presence of prosthetic heart valve: Secondary | ICD-10-CM | POA: Diagnosis not present

## 2017-11-13 DIAGNOSIS — Z9981 Dependence on supplemental oxygen: Secondary | ICD-10-CM | POA: Diagnosis not present

## 2017-11-13 DIAGNOSIS — I272 Pulmonary hypertension, unspecified: Secondary | ICD-10-CM | POA: Diagnosis present

## 2017-11-13 DIAGNOSIS — N179 Acute kidney failure, unspecified: Secondary | ICD-10-CM | POA: Diagnosis present

## 2017-11-13 DIAGNOSIS — I10 Essential (primary) hypertension: Secondary | ICD-10-CM | POA: Diagnosis not present

## 2017-11-13 DIAGNOSIS — I5043 Acute on chronic combined systolic (congestive) and diastolic (congestive) heart failure: Secondary | ICD-10-CM | POA: Diagnosis present

## 2017-11-13 DIAGNOSIS — I130 Hypertensive heart and chronic kidney disease with heart failure and stage 1 through stage 4 chronic kidney disease, or unspecified chronic kidney disease: Secondary | ICD-10-CM | POA: Diagnosis not present

## 2017-11-13 DIAGNOSIS — Z87891 Personal history of nicotine dependence: Secondary | ICD-10-CM | POA: Diagnosis not present

## 2017-11-13 DIAGNOSIS — Z951 Presence of aortocoronary bypass graft: Secondary | ICD-10-CM | POA: Diagnosis not present

## 2017-11-13 DIAGNOSIS — I152 Hypertension secondary to endocrine disorders: Secondary | ICD-10-CM | POA: Diagnosis present

## 2017-11-13 DIAGNOSIS — R918 Other nonspecific abnormal finding of lung field: Secondary | ICD-10-CM | POA: Diagnosis not present

## 2017-11-20 DIAGNOSIS — I5022 Chronic systolic (congestive) heart failure: Secondary | ICD-10-CM | POA: Diagnosis not present

## 2017-11-21 DIAGNOSIS — I5023 Acute on chronic systolic (congestive) heart failure: Secondary | ICD-10-CM | POA: Diagnosis not present

## 2017-11-25 DIAGNOSIS — I152 Hypertension secondary to endocrine disorders: Secondary | ICD-10-CM | POA: Diagnosis present

## 2017-11-25 DIAGNOSIS — I481 Persistent atrial fibrillation: Secondary | ICD-10-CM | POA: Diagnosis present

## 2017-11-25 DIAGNOSIS — Z950 Presence of cardiac pacemaker: Secondary | ICD-10-CM | POA: Diagnosis not present

## 2017-11-25 DIAGNOSIS — I2510 Atherosclerotic heart disease of native coronary artery without angina pectoris: Secondary | ICD-10-CM | POA: Diagnosis present

## 2017-11-25 DIAGNOSIS — J449 Chronic obstructive pulmonary disease, unspecified: Secondary | ICD-10-CM | POA: Diagnosis present

## 2017-11-25 DIAGNOSIS — Z515 Encounter for palliative care: Secondary | ICD-10-CM | POA: Diagnosis not present

## 2017-11-25 DIAGNOSIS — Z794 Long term (current) use of insulin: Secondary | ICD-10-CM | POA: Diagnosis not present

## 2017-11-25 DIAGNOSIS — M109 Gout, unspecified: Secondary | ICD-10-CM | POA: Diagnosis present

## 2017-11-25 DIAGNOSIS — M25512 Pain in left shoulder: Secondary | ICD-10-CM | POA: Diagnosis not present

## 2017-11-25 DIAGNOSIS — Z9889 Other specified postprocedural states: Secondary | ICD-10-CM | POA: Diagnosis not present

## 2017-11-25 DIAGNOSIS — Z933 Colostomy status: Secondary | ICD-10-CM | POA: Diagnosis not present

## 2017-11-25 DIAGNOSIS — Z95810 Presence of automatic (implantable) cardiac defibrillator: Secondary | ICD-10-CM | POA: Diagnosis not present

## 2017-11-25 DIAGNOSIS — J811 Chronic pulmonary edema: Secondary | ICD-10-CM | POA: Diagnosis not present

## 2017-11-25 DIAGNOSIS — Z951 Presence of aortocoronary bypass graft: Secondary | ICD-10-CM | POA: Diagnosis not present

## 2017-11-25 DIAGNOSIS — M19012 Primary osteoarthritis, left shoulder: Secondary | ICD-10-CM | POA: Diagnosis not present

## 2017-11-25 DIAGNOSIS — E1122 Type 2 diabetes mellitus with diabetic chronic kidney disease: Secondary | ICD-10-CM | POA: Diagnosis present

## 2017-11-25 DIAGNOSIS — M79672 Pain in left foot: Secondary | ICD-10-CM | POA: Diagnosis not present

## 2017-11-25 DIAGNOSIS — R918 Other nonspecific abnormal finding of lung field: Secondary | ICD-10-CM | POA: Diagnosis not present

## 2017-11-25 DIAGNOSIS — Z9981 Dependence on supplemental oxygen: Secondary | ICD-10-CM | POA: Diagnosis not present

## 2017-11-25 DIAGNOSIS — I5023 Acute on chronic systolic (congestive) heart failure: Secondary | ICD-10-CM | POA: Diagnosis present

## 2017-11-25 DIAGNOSIS — Z953 Presence of xenogenic heart valve: Secondary | ICD-10-CM | POA: Diagnosis not present

## 2017-11-25 DIAGNOSIS — N183 Chronic kidney disease, stage 3 (moderate): Secondary | ICD-10-CM | POA: Diagnosis present

## 2017-11-25 DIAGNOSIS — I5084 End stage heart failure: Secondary | ICD-10-CM | POA: Diagnosis present

## 2017-11-25 DIAGNOSIS — M25572 Pain in left ankle and joints of left foot: Secondary | ICD-10-CM | POA: Diagnosis not present

## 2017-11-25 DIAGNOSIS — Z79899 Other long term (current) drug therapy: Secondary | ICD-10-CM | POA: Diagnosis not present

## 2017-11-25 DIAGNOSIS — E1169 Type 2 diabetes mellitus with other specified complication: Secondary | ICD-10-CM | POA: Diagnosis present

## 2017-11-25 DIAGNOSIS — Z7901 Long term (current) use of anticoagulants: Secondary | ICD-10-CM | POA: Diagnosis not present

## 2017-11-25 DIAGNOSIS — R609 Edema, unspecified: Secondary | ICD-10-CM | POA: Diagnosis not present

## 2017-11-25 DIAGNOSIS — Z7189 Other specified counseling: Secondary | ICD-10-CM | POA: Diagnosis not present

## 2017-11-25 DIAGNOSIS — I482 Chronic atrial fibrillation: Secondary | ICD-10-CM | POA: Diagnosis not present

## 2017-11-25 DIAGNOSIS — Z87891 Personal history of nicotine dependence: Secondary | ICD-10-CM | POA: Diagnosis not present

## 2017-11-25 DIAGNOSIS — Z7409 Other reduced mobility: Secondary | ICD-10-CM | POA: Diagnosis not present

## 2017-11-25 DIAGNOSIS — Z7951 Long term (current) use of inhaled steroids: Secondary | ICD-10-CM | POA: Diagnosis not present

## 2017-11-25 DIAGNOSIS — R5381 Other malaise: Secondary | ICD-10-CM | POA: Diagnosis not present

## 2017-11-25 DIAGNOSIS — I272 Pulmonary hypertension, unspecified: Secondary | ICD-10-CM | POA: Diagnosis present

## 2017-11-25 DIAGNOSIS — E785 Hyperlipidemia, unspecified: Secondary | ICD-10-CM | POA: Diagnosis present

## 2017-11-25 DIAGNOSIS — E7800 Pure hypercholesterolemia, unspecified: Secondary | ICD-10-CM | POA: Diagnosis present

## 2017-11-25 DIAGNOSIS — L03116 Cellulitis of left lower limb: Secondary | ICD-10-CM | POA: Diagnosis present

## 2017-11-25 DIAGNOSIS — Z952 Presence of prosthetic heart valve: Secondary | ICD-10-CM | POA: Diagnosis not present

## 2017-12-04 DIAGNOSIS — E1122 Type 2 diabetes mellitus with diabetic chronic kidney disease: Secondary | ICD-10-CM | POA: Diagnosis not present

## 2017-12-04 DIAGNOSIS — J449 Chronic obstructive pulmonary disease, unspecified: Secondary | ICD-10-CM | POA: Diagnosis not present

## 2017-12-04 DIAGNOSIS — Z9181 History of falling: Secondary | ICD-10-CM | POA: Diagnosis not present

## 2017-12-04 DIAGNOSIS — E785 Hyperlipidemia, unspecified: Secondary | ICD-10-CM | POA: Diagnosis not present

## 2017-12-04 DIAGNOSIS — I4891 Unspecified atrial fibrillation: Secondary | ICD-10-CM | POA: Diagnosis not present

## 2017-12-04 DIAGNOSIS — E1169 Type 2 diabetes mellitus with other specified complication: Secondary | ICD-10-CM | POA: Diagnosis not present

## 2017-12-04 DIAGNOSIS — Z87891 Personal history of nicotine dependence: Secondary | ICD-10-CM | POA: Diagnosis not present

## 2017-12-04 DIAGNOSIS — I5022 Chronic systolic (congestive) heart failure: Secondary | ICD-10-CM | POA: Diagnosis not present

## 2017-12-04 DIAGNOSIS — Z452 Encounter for adjustment and management of vascular access device: Secondary | ICD-10-CM | POA: Diagnosis not present

## 2017-12-04 DIAGNOSIS — Z794 Long term (current) use of insulin: Secondary | ICD-10-CM | POA: Diagnosis not present

## 2017-12-04 DIAGNOSIS — I130 Hypertensive heart and chronic kidney disease with heart failure and stage 1 through stage 4 chronic kidney disease, or unspecified chronic kidney disease: Secondary | ICD-10-CM | POA: Diagnosis not present

## 2017-12-04 DIAGNOSIS — M109 Gout, unspecified: Secondary | ICD-10-CM | POA: Diagnosis not present

## 2017-12-04 DIAGNOSIS — Z8546 Personal history of malignant neoplasm of prostate: Secondary | ICD-10-CM | POA: Diagnosis not present

## 2017-12-04 DIAGNOSIS — I482 Chronic atrial fibrillation: Secondary | ICD-10-CM | POA: Diagnosis not present

## 2017-12-04 DIAGNOSIS — Z9981 Dependence on supplemental oxygen: Secondary | ICD-10-CM | POA: Diagnosis not present

## 2017-12-04 DIAGNOSIS — Z951 Presence of aortocoronary bypass graft: Secondary | ICD-10-CM | POA: Diagnosis not present

## 2017-12-04 DIAGNOSIS — I2510 Atherosclerotic heart disease of native coronary artery without angina pectoris: Secondary | ICD-10-CM | POA: Diagnosis not present

## 2017-12-04 DIAGNOSIS — Z952 Presence of prosthetic heart valve: Secondary | ICD-10-CM | POA: Diagnosis not present

## 2017-12-04 DIAGNOSIS — Z5181 Encounter for therapeutic drug level monitoring: Secondary | ICD-10-CM | POA: Diagnosis not present

## 2017-12-04 DIAGNOSIS — Z7901 Long term (current) use of anticoagulants: Secondary | ICD-10-CM | POA: Diagnosis not present

## 2017-12-04 DIAGNOSIS — N183 Chronic kidney disease, stage 3 (moderate): Secondary | ICD-10-CM | POA: Diagnosis not present

## 2017-12-04 DIAGNOSIS — I5032 Chronic diastolic (congestive) heart failure: Secondary | ICD-10-CM | POA: Diagnosis not present

## 2017-12-07 DIAGNOSIS — Z952 Presence of prosthetic heart valve: Secondary | ICD-10-CM | POA: Diagnosis not present

## 2017-12-07 DIAGNOSIS — I2510 Atherosclerotic heart disease of native coronary artery without angina pectoris: Secondary | ICD-10-CM | POA: Diagnosis not present

## 2017-12-07 DIAGNOSIS — I5022 Chronic systolic (congestive) heart failure: Secondary | ICD-10-CM | POA: Diagnosis not present

## 2017-12-07 DIAGNOSIS — N183 Chronic kidney disease, stage 3 (moderate): Secondary | ICD-10-CM | POA: Diagnosis not present

## 2017-12-07 DIAGNOSIS — Z7901 Long term (current) use of anticoagulants: Secondary | ICD-10-CM | POA: Diagnosis not present

## 2017-12-07 DIAGNOSIS — I482 Chronic atrial fibrillation: Secondary | ICD-10-CM | POA: Diagnosis not present

## 2017-12-10 ENCOUNTER — Other Ambulatory Visit
Admission: RE | Admit: 2017-12-10 | Discharge: 2017-12-10 | Disposition: A | Discharge status: Home / Self Care | Payer: Medicare Other | Source: Ambulatory Visit | Attending: Internal Medicine | Admitting: Internal Medicine

## 2017-12-10 DIAGNOSIS — Z5181 Encounter for therapeutic drug level monitoring: Secondary | ICD-10-CM | POA: Insufficient documentation

## 2017-12-10 DIAGNOSIS — I5022 Chronic systolic (congestive) heart failure: Secondary | ICD-10-CM | POA: Diagnosis not present

## 2017-12-10 DIAGNOSIS — N183 Chronic kidney disease, stage 3 (moderate): Secondary | ICD-10-CM | POA: Diagnosis not present

## 2017-12-10 DIAGNOSIS — E1122 Type 2 diabetes mellitus with diabetic chronic kidney disease: Secondary | ICD-10-CM | POA: Diagnosis not present

## 2017-12-10 DIAGNOSIS — I130 Hypertensive heart and chronic kidney disease with heart failure and stage 1 through stage 4 chronic kidney disease, or unspecified chronic kidney disease: Secondary | ICD-10-CM | POA: Diagnosis not present

## 2017-12-10 DIAGNOSIS — I482 Chronic atrial fibrillation: Secondary | ICD-10-CM | POA: Diagnosis not present

## 2017-12-10 DIAGNOSIS — J449 Chronic obstructive pulmonary disease, unspecified: Secondary | ICD-10-CM | POA: Diagnosis not present

## 2017-12-10 LAB — BASIC METABOLIC PANEL
Anion gap: 13 (ref 5–15)
BUN: 93 mg/dL — AB (ref 6–20)
CHLORIDE: 87 mmol/L — AB (ref 101–111)
CO2: 26 mmol/L (ref 22–32)
Calcium: 8.6 mg/dL — ABNORMAL LOW (ref 8.9–10.3)
Creatinine, Ser: 2.64 mg/dL — ABNORMAL HIGH (ref 0.61–1.24)
GFR calc Af Amer: 25 mL/min — ABNORMAL LOW (ref >60)
GFR, EST NON AFRICAN AMERICAN: 22 mL/min — AB (ref >60)
GLUCOSE: 216 mg/dL — AB (ref 65–99)
POTASSIUM: 3.3 mmol/L — AB (ref 3.5–5.1)
Sodium: 126 mmol/L — ABNORMAL LOW (ref 135–145)

## 2017-12-10 LAB — MAGNESIUM: Magnesium: 2.1 mg/dL (ref 1.7–2.4)

## 2017-12-10 LAB — CBC
HEMATOCRIT: 29.4 % — AB (ref 40.0–52.0)
HEMOGLOBIN: 9.7 g/dL — AB (ref 13.0–18.0)
MCH: 27.5 pg (ref 26.0–34.0)
MCHC: 32.8 g/dL (ref 32.0–36.0)
MCV: 83.7 fL (ref 80.0–100.0)
Platelets: 207 10*3/uL (ref 150–440)
RBC: 3.52 MIL/uL — ABNORMAL LOW (ref 4.40–5.90)
RDW: 21.3 % — ABNORMAL HIGH (ref 11.5–14.5)
WBC: 4.9 10*3/uL (ref 3.8–10.6)

## 2017-12-10 LAB — PROTIME-INR
INR: 1.77
Prothrombin Time: 20.5 seconds — ABNORMAL HIGH (ref 11.4–15.2)

## 2017-12-12 DIAGNOSIS — Z952 Presence of prosthetic heart valve: Secondary | ICD-10-CM | POA: Diagnosis not present

## 2017-12-12 DIAGNOSIS — I482 Chronic atrial fibrillation: Secondary | ICD-10-CM | POA: Diagnosis not present

## 2017-12-12 DIAGNOSIS — Z7901 Long term (current) use of anticoagulants: Secondary | ICD-10-CM | POA: Diagnosis not present

## 2017-12-13 ENCOUNTER — Other Ambulatory Visit: Payer: Self-pay

## 2017-12-13 ENCOUNTER — Emergency Department
Admission: EM | Admit: 2017-12-13 | Discharge: 2017-12-13 | Disposition: A | Discharge status: Home / Self Care | Payer: Medicare Other | Attending: Emergency Medicine | Admitting: Emergency Medicine

## 2017-12-13 ENCOUNTER — Encounter: Payer: Self-pay | Admitting: Emergency Medicine

## 2017-12-13 DIAGNOSIS — Z87891 Personal history of nicotine dependence: Secondary | ICD-10-CM | POA: Insufficient documentation

## 2017-12-13 DIAGNOSIS — I5022 Chronic systolic (congestive) heart failure: Secondary | ICD-10-CM | POA: Insufficient documentation

## 2017-12-13 DIAGNOSIS — E876 Hypokalemia: Secondary | ICD-10-CM | POA: Insufficient documentation

## 2017-12-13 DIAGNOSIS — N189 Chronic kidney disease, unspecified: Secondary | ICD-10-CM | POA: Insufficient documentation

## 2017-12-13 DIAGNOSIS — I129 Hypertensive chronic kidney disease with stage 1 through stage 4 chronic kidney disease, or unspecified chronic kidney disease: Secondary | ICD-10-CM | POA: Diagnosis not present

## 2017-12-13 DIAGNOSIS — I130 Hypertensive heart and chronic kidney disease with heart failure and stage 1 through stage 4 chronic kidney disease, or unspecified chronic kidney disease: Secondary | ICD-10-CM | POA: Insufficient documentation

## 2017-12-13 DIAGNOSIS — E871 Hypo-osmolality and hyponatremia: Secondary | ICD-10-CM | POA: Diagnosis not present

## 2017-12-13 DIAGNOSIS — Z79899 Other long term (current) drug therapy: Secondary | ICD-10-CM | POA: Diagnosis not present

## 2017-12-13 DIAGNOSIS — J449 Chronic obstructive pulmonary disease, unspecified: Secondary | ICD-10-CM | POA: Diagnosis not present

## 2017-12-13 DIAGNOSIS — Z794 Long term (current) use of insulin: Secondary | ICD-10-CM | POA: Insufficient documentation

## 2017-12-13 DIAGNOSIS — I2510 Atherosclerotic heart disease of native coronary artery without angina pectoris: Secondary | ICD-10-CM | POA: Diagnosis not present

## 2017-12-13 DIAGNOSIS — Z7901 Long term (current) use of anticoagulants: Secondary | ICD-10-CM | POA: Insufficient documentation

## 2017-12-13 DIAGNOSIS — I252 Old myocardial infarction: Secondary | ICD-10-CM | POA: Insufficient documentation

## 2017-12-13 DIAGNOSIS — E1122 Type 2 diabetes mellitus with diabetic chronic kidney disease: Secondary | ICD-10-CM | POA: Diagnosis not present

## 2017-12-13 DIAGNOSIS — Z008 Encounter for other general examination: Secondary | ICD-10-CM | POA: Diagnosis present

## 2017-12-13 LAB — CBC WITH DIFFERENTIAL/PLATELET
BASOS ABS: 0 10*3/uL (ref 0–0.1)
Basophils Relative: 1 %
EOS PCT: 1 %
Eosinophils Absolute: 0 10*3/uL (ref 0–0.7)
HEMATOCRIT: 31 % — AB (ref 40.0–52.0)
Hemoglobin: 10.4 g/dL — ABNORMAL LOW (ref 13.0–18.0)
LYMPHS ABS: 0.4 10*3/uL — AB (ref 1.0–3.6)
LYMPHS PCT: 6 %
MCH: 27.9 pg (ref 26.0–34.0)
MCHC: 33.4 g/dL (ref 32.0–36.0)
MCV: 83.4 fL (ref 80.0–100.0)
Monocytes Absolute: 0.7 10*3/uL (ref 0.2–1.0)
Monocytes Relative: 10 %
NEUTROS ABS: 5.9 10*3/uL (ref 1.4–6.5)
Neutrophils Relative %: 82 %
PLATELETS: 222 10*3/uL (ref 150–440)
RBC: 3.72 MIL/uL — AB (ref 4.40–5.90)
RDW: 21 % — ABNORMAL HIGH (ref 11.5–14.5)
WBC: 7.1 10*3/uL (ref 3.8–10.6)

## 2017-12-13 LAB — COMPREHENSIVE METABOLIC PANEL
ALT: 15 U/L — ABNORMAL LOW (ref 17–63)
ANION GAP: 12 (ref 5–15)
AST: 24 U/L (ref 15–41)
Albumin: 3.6 g/dL (ref 3.5–5.0)
Alkaline Phosphatase: 57 U/L (ref 38–126)
BILIRUBIN TOTAL: 0.9 mg/dL (ref 0.3–1.2)
BUN: 99 mg/dL — ABNORMAL HIGH (ref 6–20)
CO2: 26 mmol/L (ref 22–32)
Calcium: 9 mg/dL (ref 8.9–10.3)
Chloride: 88 mmol/L — ABNORMAL LOW (ref 101–111)
Creatinine, Ser: 2.82 mg/dL — ABNORMAL HIGH (ref 0.61–1.24)
GFR, EST AFRICAN AMERICAN: 23 mL/min — AB (ref >60)
GFR, EST NON AFRICAN AMERICAN: 20 mL/min — AB (ref >60)
Glucose, Bld: 183 mg/dL — ABNORMAL HIGH (ref 65–99)
POTASSIUM: 3.2 mmol/L — AB (ref 3.5–5.1)
Sodium: 126 mmol/L — ABNORMAL LOW (ref 135–145)
TOTAL PROTEIN: 7.3 g/dL (ref 6.5–8.1)

## 2017-12-13 LAB — PROTIME-INR
INR: 2.64
Prothrombin Time: 28 seconds — ABNORMAL HIGH (ref 11.4–15.2)

## 2017-12-13 LAB — MAGNESIUM: MAGNESIUM: 2.1 mg/dL (ref 1.7–2.4)

## 2017-12-13 NOTE — ED Notes (Signed)
Pt has no complaints today. Here for lab draw r/t home meds that need to be given.  Drawn from pts picc line.

## 2017-12-13 NOTE — ED Notes (Signed)

## 2017-12-13 NOTE — ED Provider Notes (Addendum)
[facility] Emergency Department Provider Note  ____________________________________________   First MD Initiated Contact with Patient 12/13/17 1030     (approximate)  I have reviewed the triage vital signs and the nursing notes.   HISTORY  Chief Complaint needs lab work   HPI [name] is a 76 y.o. male with history of heart failure, kidney failure with an artificial heart valve who is presenting to the emergency department for a lab check.  He largely has his medications managed at home by his family.  His daughter is a Marine scientist here in the [facility] emergency department and follows closely his lab results.  They are requesting blood draws today so they can titrate his medications accordingly.  The patient is asymptomatic.  Denying any shortness of breath or pain.  Past Medical History:  Diagnosis Date  . Adenomatous polyps   . Chronic atrial fibrillation (HCC)    a. warfarin; b. CHADSVASc 6 (CHF, HTN, age x 2, DM, vascular disease) giving him an estimated annual stroke risk of 9.8%  . Chronic systolic CHF (congestive heart failure) ([place])    a. echo 10/2014: EF 30-35%, AK of inf & post myocardium, HK of apical & lat wall, ant & antsept wall best preserved, mitral: mechanical prosthesis was present & well seated, LA moderately dilated, RV systolic fxn mildly reduced, PASP nl  . COPD (chronic obstructive pulmonary disease) ([place])    URI 9/12  . Coronary artery disease    Cath 2005- patent LIMA-LAD, SVG-OM grafts, moderate MR  . Diverticulosis    left side   . DM type 2 (diabetes mellitus, type 2) ([place])   . Glaucoma   . Gout   . H/O mitral valve prolapse   . Hard of hearing   . Hyperkalemia   . Hyperlipidemia   . Hypertension   . Myocardial infarction ([place])   . PNA (pneumonia) 03/2010  . Prostate cancer ([place]) 04/2009  . Sigmoid diverticulitis    perforated    Patient Active Problem List   Diagnosis Date Noted  . COPD exacerbation ([place]) 05/28/2016    . Acute on chronic systolic (congestive) heart failure ([place]) 11/20/2015  . Aortic stenosis 11/20/2015  . Encounter for therapeutic drug monitoring 02/10/2014  . Bradycardia 10/28/2013  . Aspiration into respiratory tract 03/14/2013  . Mediastinal lymphadenopathy 03/01/2013  . Pneumonia 11/23/2012  . Chronic respiratory failure with hypoxia ([place]) 11/09/2012  . Preoperative cardiovascular examination 09/14/2012  . Personal history of adenomatous colonic polyps 04/30/2012  . 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 COPD (chronic obstructive pulmonary disease) ([place]) 09/15/2011  . Mitral valve disorder 03/11/2011  . Long term current use of anticoagulant 03/11/2011  . MITRAL VALVE REPLACEMENT, HX OF 07/29/2010  . Diabetes mellitus type 2 with complications ([place]) 60/45/4098  . Hyperlipidemia 01/25/2010  . Essential hypertension 05/01/2009  . CAD, ARTERY BYPASS GRAFT 05/01/2009  . Atrial fibrillation ([place]) 05/01/2009  . Chronic systolic heart failure ([place]) 05/01/2009    Past Surgical History:  Procedure Laterality Date  . CATARACT EXTRACTION    . COLON RESECTION  01/12/2012   sigmoid colectomy and colostomy for diverticulitis  . COLONOSCOPY  11/23/2008  . CORONARY ARTERY BYPASS GRAFT  1989   LIMA-LAD, SVG-OM  . EXTERNAL EAR SURGERY     drum  . HERNIA REPAIR    . MITRAL VALVE REPLACEMENT  2007   Mechanical, performed at [facility]    Prior to Admission medications  Medication Sig Start Date End Date Taking? Authorizing Provider  OXYGEN o2 2 to 3lpm 24/7 [facility]   Yes [provider]  albuterol (PROVENTIL HFA;VENTOLIN HFA) 108 (90 Base) MCG/ACT inhaler Inhale 2 puffs into the lungs every 6 (six) hours as needed for wheezing or shortness of breath. 02/12/17   Juanito Doom, MD  albuterol (PROVENTIL) (2.5 MG/3ML) 0.083% nebulizer solution Take 3 mLs (2.5 mg total) by nebulization every 6 (six) hours as needed for  wheezing or shortness of breath. 02/16/17   Juanito Doom, MD  aspirin EC 81 MG tablet Take 1 tablet (81 mg total) by mouth daily. Patient not taking: Reported on 09/21/2017 11/24/15   Charlie Pitter, PA-C  beta carotene w/minerals (OCUVITE) tablet Take 1 tablet by mouth daily.    [provider]  budesonide-formoterol (SYMBICORT) 160-4.5 MCG/ACT inhaler Inhale 2 puffs into the lungs 2 (two) times daily. 07/10/16   Juanito Doom, MD  budesonide-formoterol (SYMBICORT) 160-4.5 MCG/ACT inhaler Inhale 2 puffs into the lungs 2 (two) times daily. Patient not taking: Reported on 09/21/2017 11/20/16   Juanito Doom, MD  budesonide-formoterol [facility]) 160-4.5 MCG/ACT inhaler Inhale 2 puffs into the lungs 2 (two) times daily. Patient not taking: Reported on 09/21/2017 02/12/17 02/13/17  Juanito Doom, MD  Cholecalciferol (VITAMIN D) 2000 UNITS tablet Take 2,000 Units by mouth daily.    [provider]  digoxin (DIGOX) 0.125 MG tablet Take 1 tablet by mouth  daily Patient not taking: Reported on 09/21/2017 11/19/15   Minna Merritts, MD  hydrALAZINE (APRESOLINE) 10 MG tablet Take 5 mg by mouth 2 (two) times daily. 08/17/17   [provider]  insulin glargine (LANTUS) 100 UNIT/ML injection Inject 16 Units into the skin at bedtime. 06/08/17 06/08/18  [provider]  insulin lispro (HUMALOG) 100 UNIT/ML KiwkPen Inject 8 Units into the skin 3 (three) times daily with meals. 06/08/17   [provider]  isosorbide mononitrate (IMDUR) 30 MG 24 hr tablet Take 15 mg by mouth daily. 08/17/17   [provider]  latanoprost (XALATAN) 0.005 % ophthalmic solution Place 1 drop into both eyes at bedtime.  02/14/15   [provider]  metoprolol succinate (TOPROL-XL) 25 MG 24 hr tablet Take 12.5 mg by mouth daily. Take with or immediately following a meal.     [provider]  pantoprazole (PROTONIX) 40 MG tablet Take 40 mg by mouth daily. 08/25/17    [provider]  simvastatin (ZOCOR) 20 MG tablet Take 20 mg by mouth at bedtime.     [provider]  SPIRIVA RESPIMAT 2.5 MCG/ACT AERS USE 2 PUFFS DAILY 02/23/17   Juanito Doom, MD  Tamsulosin HCl (FLOMAX) 0.4 MG CAPS Take 0.4 mg by mouth daily.  07/30/11   [provider]  Tiotropium Bromide Monohydrate (SPIRIVA RESPIMAT) 2.5 MCG/ACT AERS Inhale 2 puffs into the lungs daily. Patient not taking: Reported on 09/21/2017 11/20/16   Juanito Doom, MD  torsemide (DEMADEX) 20 MG tablet Take 80 mg by mouth 2 (two) times daily. As needed    [provider]  warfarin (COUMADIN) 2 MG tablet Take 2 mg by mouth daily. Take 2 mg daily. On Tuesday, Thursday and Saturday take 4 mg.    [provider]  warfarin (COUMADIN) 2.5 MG tablet TAKE AS DIRECTED BY  [facility] Patient not taking: Reported on 09/21/2017 11/04/16   Minna Merritts, MD    Allergies Acetaminophen-codeine; Altace [ramipril]; Codeine; Sulfonamide derivatives; and  Sulfa antibiotics  Family History  Problem Relation Age of Onset  . Coronary artery disease Mother   . Heart attack Mother        x10  . Heart disease Mother   . Stroke Father   . Heart disease Father   . Heart attack Brother        with bypass   . Heart attack Sister        with bypass   . Cancer Sister        bone  . Pancreatic cancer Brother        pancreatic  . Heart disease Maternal Grandmother   . Heart disease Maternal Grandfather   . Leukemia Sister   . Cancer Maternal Aunt        ? type    Social History Social History   Tobacco Use  . Smoking status: Former Smoker    Packs/day: 4.00    Years: 23.00    Pack years: 92.00    Types: Cigarettes    Last attempt to quit: 12/23/1987    Years since quitting: 29.9  . Smokeless tobacco: Never Used  Substance Use Topics  . Alcohol use: No    Alcohol/week: 0.0 oz  . Drug use: No    Review of Systems  Constitutional: No fever/chills Eyes: No  visual changes. ENT: No sore throat. Cardiovascular: Denies chest pain. Respiratory: Denies shortness of breath. Gastrointestinal: No abdominal pain.  No nausea, no vomiting.  No diarrhea.  No constipation. Genitourinary: Negative for dysuria. Musculoskeletal: Negative for back pain. Skin: Negative for rash. Neurological: Negative for headaches, focal weakness or numbness.   ____________________________________________   PHYSICAL EXAM:  VITAL SIGNS: ED Triage Vitals [12/13/17 1027]  Enc Vitals Group     BP (!) 81/50     Pulse Rate 85     Resp 16     Temp (!) 97.5 F (36.4 C)     Temp src      SpO2 97 %     Weight      Height      Head Circumference      Peak Flow      Pain Score      Pain Loc      Pain Edu?      Excl. in [place]?     Constitutional: Alert and oriented. Well appearing and in no acute distress. Eyes: Conjunctivae are normal.  Head: Atraumatic. Nose: No congestion/rhinnorhea. Mouth/Throat: Mucous membranes are moist.  Neck: No stridor.   Cardiovascular: Normal rate, regular rhythm.  Able to hear mechanical click from heart valve.  Right upper extremity PICC line present without any surrounding erythema or pus. Respiratory: Normal respiratory effort.  No retractions. Lungs CTAB. Gastrointestinal: Soft and nontender. No distention.  Colostomy bag with brown stool with a small amount of blood which appears to be coming from the mucosal portion of the stoma.  No active bleeding at this time. Musculoskeletal: Mild lower extremity edema bilaterally.  No joint effusions. Neurologic:  Normal speech and language. No gross focal neurologic deficits are appreciated. Skin:  Skin is warm, dry and intact. No rash noted. Psychiatric: Mood and affect are normal. Speech and behavior are normal.  ____________________________________________   LABS (all labs ordered are listed, but only abnormal results are displayed)  Labs Reviewed  CBC WITH DIFFERENTIAL/PLATELET -  Abnormal; Notable for the following components:      Result Value   RBC 3.72 (*)    Hemoglobin 10.4 (*)  HCT 31.0 (*)    RDW 21.0 (*)    Lymphs Abs 0.4 (*)    All other components within normal limits  COMPREHENSIVE METABOLIC PANEL - Abnormal; Notable for the following components:   Sodium 126 (*)    Potassium 3.2 (*)    Chloride 88 (*)    Glucose, Bld 183 (*)    BUN 99 (*)    Creatinine, Ser 2.82 (*)    ALT 15 (*)    GFR calc non Af Amer 20 (*)    GFR calc Af Amer 23 (*)    All other components within normal limits  PROTIME-INR - Abnormal; Notable for the following components:   Prothrombin Time 28.0 (*)    All other components within normal limits  MAGNESIUM   ____________________________________________  EKG   ____________________________________________  RADIOLOGY   ____________________________________________   PROCEDURES  Procedure(s) performed:   Procedures  Critical Care performed:   ____________________________________________   INITIAL IMPRESSION / ASSESSMENT AND PLAN / ED COURSE  Pertinent labs & imaging results that were available during my care of the patient were reviewed by me and considered in my medical decision making (see chart for details).  DDX: Kidney failure, peripheral edema, heart failure, bleeding mucosa at the colostomy site As part of my medical decision making, I reviewed the following data within the [place] chart reviewed  ----------------------------------------- 11:46 AM on 12/13/2017 -----------------------------------------  Patient found to have slightly decreased kidney function.  Stable sodium.  Therapeutic INR.  Patient's daughter, Sharon Seller, says that she will add potassium to the patient's medication regimen and notes of increasing the potassium as well as sodium level says that they may use the diuretic.  No further complaints at this time.  Patient will be discharged home.  We discussed  the lab results in detail with the patient as well as the daughter.  Patient is chronically hypotensive and on a milrinone drip.   ____________________________________________   FINAL CLINICAL IMPRESSION(S) / ED DIAGNOSES  Chronic kidney disease.  Hyponatremia.  Hypokalemia.    NEW MEDICATIONS STARTED DURING THIS VISIT:  This SmartLink is deprecated. Use AVSMEDLIST instead to display the medication list for a patient.   Note:  This document was prepared using Dragon voice recognition software and may include unintentional dictation errors.     Orbie Pyo, MD 12/13/17 1147    Nekeshia Lenhardt, Randall An, MD 12/13/17 [phone]

## 2017-12-13 NOTE — ED Triage Notes (Signed)
Pt to ED via [place] with daughter. Pt dtr states that pt needs to have lab work done. Pt has hx/o CHF.

## 2017-12-17 DIAGNOSIS — I5022 Chronic systolic (congestive) heart failure: Secondary | ICD-10-CM | POA: Diagnosis not present

## 2017-12-17 DIAGNOSIS — Z9889 Other specified postprocedural states: Secondary | ICD-10-CM | POA: Diagnosis not present

## 2017-12-17 DIAGNOSIS — R570 Cardiogenic shock: Secondary | ICD-10-CM | POA: Diagnosis not present

## 2017-12-17 DIAGNOSIS — T8111XD Postprocedural  cardiogenic shock, subsequent encounter: Secondary | ICD-10-CM | POA: Diagnosis not present

## 2017-12-17 DIAGNOSIS — J9811 Atelectasis: Secondary | ICD-10-CM | POA: Diagnosis not present

## 2017-12-17 DIAGNOSIS — M25562 Pain in left knee: Secondary | ICD-10-CM | POA: Diagnosis present

## 2017-12-17 DIAGNOSIS — J449 Chronic obstructive pulmonary disease, unspecified: Secondary | ICD-10-CM | POA: Diagnosis present

## 2017-12-17 DIAGNOSIS — M1A9XX0 Chronic gout, unspecified, without tophus (tophi): Secondary | ICD-10-CM | POA: Diagnosis present

## 2017-12-17 DIAGNOSIS — I2510 Atherosclerotic heart disease of native coronary artery without angina pectoris: Secondary | ICD-10-CM | POA: Diagnosis present

## 2017-12-17 DIAGNOSIS — M79672 Pain in left foot: Secondary | ICD-10-CM | POA: Diagnosis present

## 2017-12-17 DIAGNOSIS — I130 Hypertensive heart and chronic kidney disease with heart failure and stage 1 through stage 4 chronic kidney disease, or unspecified chronic kidney disease: Secondary | ICD-10-CM | POA: Diagnosis present

## 2017-12-17 DIAGNOSIS — E1122 Type 2 diabetes mellitus with diabetic chronic kidney disease: Secondary | ICD-10-CM | POA: Diagnosis present

## 2017-12-17 DIAGNOSIS — Z952 Presence of prosthetic heart valve: Secondary | ICD-10-CM | POA: Diagnosis not present

## 2017-12-17 DIAGNOSIS — M79671 Pain in right foot: Secondary | ICD-10-CM | POA: Diagnosis present

## 2017-12-17 DIAGNOSIS — Z953 Presence of xenogenic heart valve: Secondary | ICD-10-CM | POA: Diagnosis not present

## 2017-12-17 DIAGNOSIS — N183 Chronic kidney disease, stage 3 (moderate): Secondary | ICD-10-CM | POA: Diagnosis present

## 2017-12-17 DIAGNOSIS — E1169 Type 2 diabetes mellitus with other specified complication: Secondary | ICD-10-CM | POA: Diagnosis present

## 2017-12-17 DIAGNOSIS — I272 Pulmonary hypertension, unspecified: Secondary | ICD-10-CM | POA: Diagnosis present

## 2017-12-17 DIAGNOSIS — E876 Hypokalemia: Secondary | ICD-10-CM | POA: Diagnosis present

## 2017-12-17 DIAGNOSIS — R208 Other disturbances of skin sensation: Secondary | ICD-10-CM | POA: Diagnosis present

## 2017-12-17 DIAGNOSIS — I5023 Acute on chronic systolic (congestive) heart failure: Secondary | ICD-10-CM | POA: Diagnosis present

## 2017-12-17 DIAGNOSIS — I482 Chronic atrial fibrillation: Secondary | ICD-10-CM | POA: Diagnosis present

## 2017-12-17 DIAGNOSIS — G8929 Other chronic pain: Secondary | ICD-10-CM | POA: Diagnosis present

## 2017-12-17 DIAGNOSIS — Z951 Presence of aortocoronary bypass graft: Secondary | ICD-10-CM | POA: Diagnosis not present

## 2017-12-17 DIAGNOSIS — M25512 Pain in left shoulder: Secondary | ICD-10-CM | POA: Diagnosis not present

## 2017-12-17 DIAGNOSIS — E785 Hyperlipidemia, unspecified: Secondary | ICD-10-CM | POA: Diagnosis present

## 2017-12-17 DIAGNOSIS — N179 Acute kidney failure, unspecified: Secondary | ICD-10-CM | POA: Diagnosis present

## 2017-12-17 DIAGNOSIS — K761 Chronic passive congestion of liver: Secondary | ICD-10-CM | POA: Diagnosis not present

## 2017-12-17 DIAGNOSIS — I255 Ischemic cardiomyopathy: Secondary | ICD-10-CM | POA: Diagnosis present

## 2017-12-17 DIAGNOSIS — Z9981 Dependence on supplemental oxygen: Secondary | ICD-10-CM | POA: Diagnosis not present

## 2017-12-17 DIAGNOSIS — Z794 Long term (current) use of insulin: Secondary | ICD-10-CM | POA: Diagnosis not present

## 2017-12-17 DIAGNOSIS — Z7189 Other specified counseling: Secondary | ICD-10-CM | POA: Diagnosis not present

## 2017-12-17 DIAGNOSIS — I509 Heart failure, unspecified: Secondary | ICD-10-CM | POA: Diagnosis present

## 2017-12-17 DIAGNOSIS — M25561 Pain in right knee: Secondary | ICD-10-CM | POA: Diagnosis present

## 2017-12-28 ENCOUNTER — Other Ambulatory Visit
Admission: RE | Admit: 2017-12-28 | Discharge: 2017-12-28 | Disposition: A | Discharge status: Home / Self Care | Payer: Medicare Other | Source: Ambulatory Visit | Attending: Internal Medicine | Admitting: Internal Medicine

## 2017-12-28 DIAGNOSIS — I5022 Chronic systolic (congestive) heart failure: Secondary | ICD-10-CM | POA: Insufficient documentation

## 2017-12-28 DIAGNOSIS — E1122 Type 2 diabetes mellitus with diabetic chronic kidney disease: Secondary | ICD-10-CM | POA: Diagnosis not present

## 2017-12-28 DIAGNOSIS — I482 Chronic atrial fibrillation: Secondary | ICD-10-CM | POA: Diagnosis not present

## 2017-12-28 DIAGNOSIS — I130 Hypertensive heart and chronic kidney disease with heart failure and stage 1 through stage 4 chronic kidney disease, or unspecified chronic kidney disease: Secondary | ICD-10-CM | POA: Diagnosis not present

## 2017-12-28 DIAGNOSIS — N183 Chronic kidney disease, stage 3 (moderate): Secondary | ICD-10-CM | POA: Diagnosis not present

## 2017-12-28 DIAGNOSIS — J449 Chronic obstructive pulmonary disease, unspecified: Secondary | ICD-10-CM | POA: Diagnosis not present

## 2017-12-28 LAB — BASIC METABOLIC PANEL
ANION GAP: 11 (ref 5–15)
BUN: 77 mg/dL — ABNORMAL HIGH (ref 6–20)
CALCIUM: 8.6 mg/dL — AB (ref 8.9–10.3)
CO2: 27 mmol/L (ref 22–32)
Chloride: 90 mmol/L — ABNORMAL LOW (ref 101–111)
Creatinine, Ser: 2.76 mg/dL — ABNORMAL HIGH (ref 0.61–1.24)
GFR, EST AFRICAN AMERICAN: 24 mL/min — AB (ref >60)
GFR, EST NON AFRICAN AMERICAN: 20 mL/min — AB (ref >60)
Glucose, Bld: 190 mg/dL — ABNORMAL HIGH (ref 65–99)
POTASSIUM: 3.6 mmol/L (ref 3.5–5.1)
Sodium: 128 mmol/L — ABNORMAL LOW (ref 135–145)

## 2017-12-28 LAB — CBC
HEMATOCRIT: 30.4 % — AB (ref 40.0–52.0)
HEMOGLOBIN: 10.4 g/dL — AB (ref 13.0–18.0)
MCH: 28.7 pg (ref 26.0–34.0)
MCHC: 34 g/dL (ref 32.0–36.0)
MCV: 84.3 fL (ref 80.0–100.0)
Platelets: 205 10*3/uL (ref 150–440)
RBC: 3.61 MIL/uL — AB (ref 4.40–5.90)
RDW: 19.5 % — ABNORMAL HIGH (ref 11.5–14.5)
WBC: 5 10*3/uL (ref 3.8–10.6)

## 2017-12-28 LAB — MAGNESIUM: MAGNESIUM: 2.2 mg/dL (ref 1.7–2.4)

## 2017-12-31 DIAGNOSIS — Z87891 Personal history of nicotine dependence: Secondary | ICD-10-CM | POA: Diagnosis not present

## 2017-12-31 DIAGNOSIS — I129 Hypertensive chronic kidney disease with stage 1 through stage 4 chronic kidney disease, or unspecified chronic kidney disease: Secondary | ICD-10-CM | POA: Diagnosis not present

## 2017-12-31 DIAGNOSIS — I482 Chronic atrial fibrillation: Secondary | ICD-10-CM | POA: Diagnosis not present

## 2017-12-31 DIAGNOSIS — Z7901 Long term (current) use of anticoagulants: Secondary | ICD-10-CM | POA: Diagnosis not present

## 2017-12-31 DIAGNOSIS — E1122 Type 2 diabetes mellitus with diabetic chronic kidney disease: Secondary | ICD-10-CM | POA: Diagnosis not present

## 2017-12-31 DIAGNOSIS — E876 Hypokalemia: Secondary | ICD-10-CM | POA: Diagnosis not present

## 2017-12-31 DIAGNOSIS — Z952 Presence of prosthetic heart valve: Secondary | ICD-10-CM | POA: Diagnosis not present

## 2017-12-31 DIAGNOSIS — I130 Hypertensive heart and chronic kidney disease with heart failure and stage 1 through stage 4 chronic kidney disease, or unspecified chronic kidney disease: Secondary | ICD-10-CM | POA: Diagnosis not present

## 2017-12-31 DIAGNOSIS — N183 Chronic kidney disease, stage 3 (moderate): Secondary | ICD-10-CM | POA: Diagnosis not present

## 2017-12-31 DIAGNOSIS — I5023 Acute on chronic systolic (congestive) heart failure: Secondary | ICD-10-CM | POA: Diagnosis not present

## 2017-12-31 DIAGNOSIS — E871 Hypo-osmolality and hyponatremia: Secondary | ICD-10-CM | POA: Diagnosis not present

## 2017-12-31 DIAGNOSIS — I4891 Unspecified atrial fibrillation: Secondary | ICD-10-CM | POA: Diagnosis not present

## 2017-12-31 DIAGNOSIS — E1159 Type 2 diabetes mellitus with other circulatory complications: Secondary | ICD-10-CM | POA: Diagnosis not present

## 2017-12-31 DIAGNOSIS — I2510 Atherosclerotic heart disease of native coronary artery without angina pectoris: Secondary | ICD-10-CM | POA: Diagnosis not present

## 2018-01-04 ENCOUNTER — Other Ambulatory Visit
Admission: RE | Admit: 2018-01-04 | Discharge: 2018-01-04 | Disposition: A | Discharge status: Home / Self Care | Payer: Medicare Other | Source: Other Acute Inpatient Hospital | Attending: Internal Medicine | Admitting: Internal Medicine

## 2018-01-04 DIAGNOSIS — J449 Chronic obstructive pulmonary disease, unspecified: Secondary | ICD-10-CM | POA: Diagnosis not present

## 2018-01-04 DIAGNOSIS — N183 Chronic kidney disease, stage 3 (moderate): Secondary | ICD-10-CM | POA: Insufficient documentation

## 2018-01-04 DIAGNOSIS — I509 Heart failure, unspecified: Secondary | ICD-10-CM | POA: Insufficient documentation

## 2018-01-04 DIAGNOSIS — I130 Hypertensive heart and chronic kidney disease with heart failure and stage 1 through stage 4 chronic kidney disease, or unspecified chronic kidney disease: Secondary | ICD-10-CM | POA: Diagnosis not present

## 2018-01-04 DIAGNOSIS — I482 Chronic atrial fibrillation: Secondary | ICD-10-CM | POA: Diagnosis not present

## 2018-01-04 DIAGNOSIS — I5022 Chronic systolic (congestive) heart failure: Secondary | ICD-10-CM | POA: Diagnosis not present

## 2018-01-04 DIAGNOSIS — E1122 Type 2 diabetes mellitus with diabetic chronic kidney disease: Secondary | ICD-10-CM | POA: Diagnosis not present

## 2018-01-04 LAB — CBC WITH DIFFERENTIAL/PLATELET
Basophils Absolute: 0 10*3/uL (ref 0–0.1)
Basophils Relative: 1 %
Eosinophils Absolute: 0.1 10*3/uL (ref 0–0.7)
Eosinophils Relative: 1 %
HEMATOCRIT: 30.7 % — AB (ref 40.0–52.0)
HEMOGLOBIN: 10.4 g/dL — AB (ref 13.0–18.0)
LYMPHS ABS: 0.4 10*3/uL — AB (ref 1.0–3.6)
LYMPHS PCT: 9 %
MCH: 28.5 pg (ref 26.0–34.0)
MCHC: 33.9 g/dL (ref 32.0–36.0)
MCV: 84 fL (ref 80.0–100.0)
Monocytes Absolute: 0.6 10*3/uL (ref 0.2–1.0)
Monocytes Relative: 12 %
NEUTROS ABS: 3.6 10*3/uL (ref 1.4–6.5)
NEUTROS PCT: 77 %
Platelets: 204 10*3/uL (ref 150–440)
RBC: 3.66 MIL/uL — AB (ref 4.40–5.90)
RDW: 18.9 % — ABNORMAL HIGH (ref 11.5–14.5)
WBC: 4.7 10*3/uL (ref 3.8–10.6)

## 2018-01-04 LAB — PROTIME-INR
INR: 2.5
Prothrombin Time: 26.8 seconds — ABNORMAL HIGH (ref 11.4–15.2)

## 2018-01-05 ENCOUNTER — Other Ambulatory Visit
Admission: RE | Admit: 2018-01-05 | Discharge: 2018-01-05 | Disposition: A | Discharge status: Home / Self Care | Payer: Medicare Other | Source: Ambulatory Visit | Attending: Internal Medicine | Admitting: Internal Medicine

## 2018-01-05 DIAGNOSIS — N183 Chronic kidney disease, stage 3 (moderate): Secondary | ICD-10-CM | POA: Diagnosis not present

## 2018-01-05 DIAGNOSIS — I129 Hypertensive chronic kidney disease with stage 1 through stage 4 chronic kidney disease, or unspecified chronic kidney disease: Secondary | ICD-10-CM | POA: Insufficient documentation

## 2018-01-05 LAB — BASIC METABOLIC PANEL
Anion gap: 14 (ref 5–15)
BUN: 98 mg/dL — AB (ref 6–20)
CHLORIDE: 93 mmol/L — AB (ref 101–111)
CO2: 22 mmol/L (ref 22–32)
Calcium: 8.7 mg/dL — ABNORMAL LOW (ref 8.9–10.3)
Creatinine, Ser: 2.76 mg/dL — ABNORMAL HIGH (ref 0.61–1.24)
GFR calc Af Amer: 24 mL/min — ABNORMAL LOW (ref >60)
GFR calc non Af Amer: 20 mL/min — ABNORMAL LOW (ref >60)
Glucose, Bld: 198 mg/dL — ABNORMAL HIGH (ref 65–99)
Potassium: 3.6 mmol/L (ref 3.5–5.1)
Sodium: 129 mmol/L — ABNORMAL LOW (ref 135–145)

## 2018-01-05 LAB — MAGNESIUM: Magnesium: 2.3 mg/dL (ref 1.7–2.4)

## 2018-01-07 ENCOUNTER — Other Ambulatory Visit
Admission: RE | Admit: 2018-01-07 | Discharge: 2018-01-07 | Disposition: A | Discharge status: Home / Self Care | Payer: Medicare Other | Source: Ambulatory Visit | Attending: Internal Medicine | Admitting: Internal Medicine

## 2018-01-07 DIAGNOSIS — I482 Chronic atrial fibrillation: Secondary | ICD-10-CM | POA: Diagnosis not present

## 2018-01-07 DIAGNOSIS — I130 Hypertensive heart and chronic kidney disease with heart failure and stage 1 through stage 4 chronic kidney disease, or unspecified chronic kidney disease: Secondary | ICD-10-CM | POA: Diagnosis not present

## 2018-01-07 DIAGNOSIS — B999 Unspecified infectious disease: Secondary | ICD-10-CM | POA: Insufficient documentation

## 2018-01-07 DIAGNOSIS — J449 Chronic obstructive pulmonary disease, unspecified: Secondary | ICD-10-CM | POA: Diagnosis not present

## 2018-01-07 DIAGNOSIS — N183 Chronic kidney disease, stage 3 (moderate): Secondary | ICD-10-CM | POA: Diagnosis not present

## 2018-01-07 DIAGNOSIS — E1122 Type 2 diabetes mellitus with diabetic chronic kidney disease: Secondary | ICD-10-CM | POA: Diagnosis not present

## 2018-01-07 DIAGNOSIS — I5022 Chronic systolic (congestive) heart failure: Secondary | ICD-10-CM | POA: Diagnosis not present

## 2018-01-07 LAB — COMPREHENSIVE METABOLIC PANEL
ALT: 15 U/L — ABNORMAL LOW (ref 17–63)
AST: 21 U/L (ref 15–41)
Albumin: 3.4 g/dL — ABNORMAL LOW (ref 3.5–5.0)
Alkaline Phosphatase: 61 U/L (ref 38–126)
Anion gap: 13 (ref 5–15)
BUN: 111 mg/dL — AB (ref 6–20)
CHLORIDE: 94 mmol/L — AB (ref 101–111)
CO2: 21 mmol/L — ABNORMAL LOW (ref 22–32)
Calcium: 8.6 mg/dL — ABNORMAL LOW (ref 8.9–10.3)
Creatinine, Ser: 2.74 mg/dL — ABNORMAL HIGH (ref 0.61–1.24)
GFR, EST AFRICAN AMERICAN: 24 mL/min — AB (ref >60)
GFR, EST NON AFRICAN AMERICAN: 21 mL/min — AB (ref >60)
Glucose, Bld: 231 mg/dL — ABNORMAL HIGH (ref 65–99)
POTASSIUM: 4.2 mmol/L (ref 3.5–5.1)
Sodium: 128 mmol/L — ABNORMAL LOW (ref 135–145)
Total Bilirubin: 1 mg/dL (ref 0.3–1.2)
Total Protein: 6.4 g/dL — ABNORMAL LOW (ref 6.5–8.1)

## 2018-01-07 LAB — MAGNESIUM: MAGNESIUM: 2.1 mg/dL (ref 1.7–2.4)

## 2018-01-11 ENCOUNTER — Other Ambulatory Visit
Admission: RE | Admit: 2018-01-11 | Discharge: 2018-01-11 | Disposition: A | Discharge status: Home / Self Care | Payer: Medicare Other | Source: Ambulatory Visit | Attending: Internal Medicine | Admitting: Internal Medicine

## 2018-01-11 DIAGNOSIS — N183 Chronic kidney disease, stage 3 (moderate): Secondary | ICD-10-CM | POA: Diagnosis not present

## 2018-01-11 DIAGNOSIS — J449 Chronic obstructive pulmonary disease, unspecified: Secondary | ICD-10-CM | POA: Diagnosis not present

## 2018-01-11 DIAGNOSIS — I130 Hypertensive heart and chronic kidney disease with heart failure and stage 1 through stage 4 chronic kidney disease, or unspecified chronic kidney disease: Secondary | ICD-10-CM | POA: Diagnosis not present

## 2018-01-11 DIAGNOSIS — I482 Chronic atrial fibrillation: Secondary | ICD-10-CM | POA: Diagnosis not present

## 2018-01-11 DIAGNOSIS — E1122 Type 2 diabetes mellitus with diabetic chronic kidney disease: Secondary | ICD-10-CM | POA: Diagnosis not present

## 2018-01-11 DIAGNOSIS — I5022 Chronic systolic (congestive) heart failure: Secondary | ICD-10-CM | POA: Diagnosis not present

## 2018-01-11 LAB — BASIC METABOLIC PANEL
ANION GAP: 15 (ref 5–15)
BUN: 109 mg/dL — AB (ref 6–20)
CALCIUM: 8.7 mg/dL — AB (ref 8.9–10.3)
CO2: 21 mmol/L — AB (ref 22–32)
CREATININE: 2.91 mg/dL — AB (ref 0.61–1.24)
Chloride: 94 mmol/L — ABNORMAL LOW (ref 101–111)
GFR calc Af Amer: 22 mL/min — ABNORMAL LOW (ref >60)
GFR, EST NON AFRICAN AMERICAN: 19 mL/min — AB (ref >60)
GLUCOSE: 191 mg/dL — AB (ref 65–99)
Potassium: 3.4 mmol/L — ABNORMAL LOW (ref 3.5–5.1)
Sodium: 130 mmol/L — ABNORMAL LOW (ref 135–145)

## 2018-01-11 LAB — CBC WITH DIFFERENTIAL/PLATELET
BASOS ABS: 0 10*3/uL (ref 0–0.1)
BASOS PCT: 1 %
EOS ABS: 0.1 10*3/uL (ref 0–0.7)
Eosinophils Relative: 1 %
HEMATOCRIT: 31 % — AB (ref 40.0–52.0)
Hemoglobin: 10.5 g/dL — ABNORMAL LOW (ref 13.0–18.0)
Lymphocytes Relative: 9 %
Lymphs Abs: 0.4 10*3/uL — ABNORMAL LOW (ref 1.0–3.6)
MCH: 28 pg (ref 26.0–34.0)
MCHC: 33.8 g/dL (ref 32.0–36.0)
MCV: 82.9 fL (ref 80.0–100.0)
MONO ABS: 0.5 10*3/uL (ref 0.2–1.0)
MONOS PCT: 11 %
Neutro Abs: 3.7 10*3/uL (ref 1.4–6.5)
Neutrophils Relative %: 78 %
Platelets: 218 10*3/uL (ref 150–440)
RBC: 3.74 MIL/uL — ABNORMAL LOW (ref 4.40–5.90)
RDW: 18.3 % — AB (ref 11.5–14.5)
WBC: 4.7 10*3/uL (ref 3.8–10.6)

## 2018-01-11 LAB — PROTIME-INR
INR: 2.85
PROTHROMBIN TIME: 29.7 s — AB (ref 11.4–15.2)

## 2018-01-11 LAB — MAGNESIUM: Magnesium: 2.3 mg/dL (ref 1.7–2.4)

## 2018-01-14 ENCOUNTER — Other Ambulatory Visit
Admission: RE | Admit: 2018-01-14 | Discharge: 2018-01-14 | Disposition: A | Discharge status: Home / Self Care | Payer: Medicare Other | Source: Ambulatory Visit | Attending: Internal Medicine | Admitting: Internal Medicine

## 2018-01-14 DIAGNOSIS — E1122 Type 2 diabetes mellitus with diabetic chronic kidney disease: Secondary | ICD-10-CM | POA: Diagnosis not present

## 2018-01-14 DIAGNOSIS — J449 Chronic obstructive pulmonary disease, unspecified: Secondary | ICD-10-CM | POA: Diagnosis not present

## 2018-01-14 DIAGNOSIS — I5022 Chronic systolic (congestive) heart failure: Secondary | ICD-10-CM | POA: Diagnosis not present

## 2018-01-14 DIAGNOSIS — N183 Chronic kidney disease, stage 3 (moderate): Secondary | ICD-10-CM | POA: Diagnosis not present

## 2018-01-14 DIAGNOSIS — L0390 Cellulitis, unspecified: Secondary | ICD-10-CM | POA: Insufficient documentation

## 2018-01-14 DIAGNOSIS — I130 Hypertensive heart and chronic kidney disease with heart failure and stage 1 through stage 4 chronic kidney disease, or unspecified chronic kidney disease: Secondary | ICD-10-CM | POA: Diagnosis not present

## 2018-01-14 DIAGNOSIS — I482 Chronic atrial fibrillation: Secondary | ICD-10-CM | POA: Diagnosis not present

## 2018-01-14 LAB — COMPREHENSIVE METABOLIC PANEL
ALBUMIN: 3.5 g/dL (ref 3.5–5.0)
ALT: 13 U/L — ABNORMAL LOW (ref 17–63)
ANION GAP: 14 (ref 5–15)
AST: 20 U/L (ref 15–41)
Alkaline Phosphatase: 53 U/L (ref 38–126)
BUN: 125 mg/dL — ABNORMAL HIGH (ref 6–20)
CALCIUM: 8.8 mg/dL — AB (ref 8.9–10.3)
CO2: 22 mmol/L (ref 22–32)
Chloride: 96 mmol/L — ABNORMAL LOW (ref 101–111)
Creatinine, Ser: 3.03 mg/dL — ABNORMAL HIGH (ref 0.61–1.24)
GFR calc non Af Amer: 18 mL/min — ABNORMAL LOW (ref >60)
GFR, EST AFRICAN AMERICAN: 21 mL/min — AB (ref >60)
GLUCOSE: 281 mg/dL — AB (ref 65–99)
Potassium: 3.8 mmol/L (ref 3.5–5.1)
SODIUM: 132 mmol/L — AB (ref 135–145)
TOTAL PROTEIN: 6.7 g/dL (ref 6.5–8.1)
Total Bilirubin: 0.8 mg/dL (ref 0.3–1.2)

## 2018-01-15 DIAGNOSIS — H04123 Dry eye syndrome of bilateral lacrimal glands: Secondary | ICD-10-CM | POA: Diagnosis not present

## 2018-01-18 ENCOUNTER — Other Ambulatory Visit
Admission: RE | Admit: 2018-01-18 | Discharge: 2018-01-18 | Disposition: A | Discharge status: Home / Self Care | Payer: Medicare Other | Source: Ambulatory Visit | Attending: Internal Medicine | Admitting: Internal Medicine

## 2018-01-18 DIAGNOSIS — I5022 Chronic systolic (congestive) heart failure: Secondary | ICD-10-CM | POA: Insufficient documentation

## 2018-01-18 DIAGNOSIS — J449 Chronic obstructive pulmonary disease, unspecified: Secondary | ICD-10-CM | POA: Diagnosis not present

## 2018-01-18 DIAGNOSIS — I130 Hypertensive heart and chronic kidney disease with heart failure and stage 1 through stage 4 chronic kidney disease, or unspecified chronic kidney disease: Secondary | ICD-10-CM | POA: Diagnosis not present

## 2018-01-18 DIAGNOSIS — N183 Chronic kidney disease, stage 3 (moderate): Secondary | ICD-10-CM | POA: Diagnosis not present

## 2018-01-18 DIAGNOSIS — I482 Chronic atrial fibrillation: Secondary | ICD-10-CM | POA: Diagnosis not present

## 2018-01-18 DIAGNOSIS — E1122 Type 2 diabetes mellitus with diabetic chronic kidney disease: Secondary | ICD-10-CM | POA: Diagnosis not present

## 2018-01-18 LAB — BASIC METABOLIC PANEL
Anion gap: 14 (ref 5–15)
BUN: 140 mg/dL — ABNORMAL HIGH (ref 6–20)
CO2: 22 mmol/L (ref 22–32)
Calcium: 8.6 mg/dL — ABNORMAL LOW (ref 8.9–10.3)
Chloride: 94 mmol/L — ABNORMAL LOW (ref 101–111)
Creatinine, Ser: 2.87 mg/dL — ABNORMAL HIGH (ref 0.61–1.24)
GFR calc Af Amer: 23 mL/min — ABNORMAL LOW (ref >60)
GFR calc non Af Amer: 20 mL/min — ABNORMAL LOW (ref >60)
GLUCOSE: 203 mg/dL — AB (ref 65–99)
POTASSIUM: 4.2 mmol/L (ref 3.5–5.1)
Sodium: 130 mmol/L — ABNORMAL LOW (ref 135–145)

## 2018-01-18 LAB — PROTIME-INR
INR: 3.09
Prothrombin Time: 31.6 seconds — ABNORMAL HIGH (ref 11.4–15.2)

## 2018-01-18 LAB — CBC WITH DIFFERENTIAL/PLATELET
Basophils Absolute: 0 10*3/uL (ref 0–0.1)
Basophils Relative: 1 %
Eosinophils Absolute: 0 10*3/uL (ref 0–0.7)
Eosinophils Relative: 1 %
HEMATOCRIT: 30.6 % — AB (ref 40.0–52.0)
Hemoglobin: 10.2 g/dL — ABNORMAL LOW (ref 13.0–18.0)
LYMPHS PCT: 6 %
Lymphs Abs: 0.3 10*3/uL — ABNORMAL LOW (ref 1.0–3.6)
MCH: 27.4 pg (ref 26.0–34.0)
MCHC: 33.3 g/dL (ref 32.0–36.0)
MCV: 82.5 fL (ref 80.0–100.0)
MONO ABS: 0.6 10*3/uL (ref 0.2–1.0)
MONOS PCT: 11 %
Neutro Abs: 4.3 10*3/uL (ref 1.4–6.5)
Neutrophils Relative %: 81 %
Platelets: 206 10*3/uL (ref 150–440)
RBC: 3.71 MIL/uL — ABNORMAL LOW (ref 4.40–5.90)
RDW: 17.7 % — AB (ref 11.5–14.5)
WBC: 5.3 10*3/uL (ref 3.8–10.6)

## 2018-01-18 LAB — MAGNESIUM: Magnesium: 2 mg/dL (ref 1.7–2.4)

## 2018-01-21 ENCOUNTER — Other Ambulatory Visit
Admission: RE | Admit: 2018-01-21 | Discharge: 2018-01-21 | Disposition: A | Discharge status: Home / Self Care | Payer: Medicare Other | Source: Ambulatory Visit | Attending: Internal Medicine | Admitting: Internal Medicine

## 2018-01-21 DIAGNOSIS — I130 Hypertensive heart and chronic kidney disease with heart failure and stage 1 through stage 4 chronic kidney disease, or unspecified chronic kidney disease: Secondary | ICD-10-CM | POA: Insufficient documentation

## 2018-01-21 DIAGNOSIS — J449 Chronic obstructive pulmonary disease, unspecified: Secondary | ICD-10-CM | POA: Diagnosis not present

## 2018-01-21 DIAGNOSIS — I5022 Chronic systolic (congestive) heart failure: Secondary | ICD-10-CM | POA: Diagnosis not present

## 2018-01-21 DIAGNOSIS — N183 Chronic kidney disease, stage 3 (moderate): Secondary | ICD-10-CM | POA: Diagnosis not present

## 2018-01-21 DIAGNOSIS — E1122 Type 2 diabetes mellitus with diabetic chronic kidney disease: Secondary | ICD-10-CM | POA: Diagnosis not present

## 2018-01-21 DIAGNOSIS — I482 Chronic atrial fibrillation: Secondary | ICD-10-CM | POA: Diagnosis not present

## 2018-01-21 LAB — CBC WITH DIFFERENTIAL/PLATELET
BASOS ABS: 0 10*3/uL (ref 0–0.1)
Basophils Relative: 1 %
Eosinophils Absolute: 0.1 10*3/uL (ref 0–0.7)
Eosinophils Relative: 1 %
HCT: 32.8 % — ABNORMAL LOW (ref 40.0–52.0)
HEMOGLOBIN: 11 g/dL — AB (ref 13.0–18.0)
LYMPHS PCT: 9 %
Lymphs Abs: 0.5 10*3/uL — ABNORMAL LOW (ref 1.0–3.6)
MCH: 27.3 pg (ref 26.0–34.0)
MCHC: 33.6 g/dL (ref 32.0–36.0)
MCV: 81.3 fL (ref 80.0–100.0)
Monocytes Absolute: 0.7 10*3/uL (ref 0.2–1.0)
Monocytes Relative: 12 %
NEUTROS ABS: 4.6 10*3/uL (ref 1.4–6.5)
NEUTROS PCT: 77 %
Platelets: 218 10*3/uL (ref 150–440)
RBC: 4.03 MIL/uL — AB (ref 4.40–5.90)
RDW: 17.7 % — ABNORMAL HIGH (ref 11.5–14.5)
WBC: 5.9 10*3/uL (ref 3.8–10.6)

## 2018-01-21 LAB — BASIC METABOLIC PANEL
ANION GAP: 13 (ref 5–15)
BUN: 113 mg/dL — ABNORMAL HIGH (ref 6–20)
CO2: 25 mmol/L (ref 22–32)
Calcium: 9.2 mg/dL (ref 8.9–10.3)
Chloride: 95 mmol/L — ABNORMAL LOW (ref 101–111)
Creatinine, Ser: 3.21 mg/dL — ABNORMAL HIGH (ref 0.61–1.24)
GFR calc Af Amer: 20 mL/min — ABNORMAL LOW (ref >60)
GFR, EST NON AFRICAN AMERICAN: 17 mL/min — AB (ref >60)
GLUCOSE: 96 mg/dL (ref 65–99)
POTASSIUM: 3.8 mmol/L (ref 3.5–5.1)
SODIUM: 133 mmol/L — AB (ref 135–145)

## 2018-01-21 LAB — MAGNESIUM: Magnesium: 2.1 mg/dL (ref 1.7–2.4)

## 2018-01-25 ENCOUNTER — Other Ambulatory Visit
Admission: RE | Admit: 2018-01-25 | Discharge: 2018-01-25 | Disposition: A | Discharge status: Home / Self Care | Payer: Medicare Other | Source: Other Acute Inpatient Hospital | Attending: Internal Medicine | Admitting: Internal Medicine

## 2018-01-25 DIAGNOSIS — N183 Chronic kidney disease, stage 3 (moderate): Secondary | ICD-10-CM | POA: Diagnosis not present

## 2018-01-25 DIAGNOSIS — I130 Hypertensive heart and chronic kidney disease with heart failure and stage 1 through stage 4 chronic kidney disease, or unspecified chronic kidney disease: Secondary | ICD-10-CM | POA: Insufficient documentation

## 2018-01-25 DIAGNOSIS — I5022 Chronic systolic (congestive) heart failure: Secondary | ICD-10-CM | POA: Diagnosis not present

## 2018-01-25 DIAGNOSIS — I482 Chronic atrial fibrillation: Secondary | ICD-10-CM | POA: Diagnosis not present

## 2018-01-25 DIAGNOSIS — E1122 Type 2 diabetes mellitus with diabetic chronic kidney disease: Secondary | ICD-10-CM | POA: Diagnosis not present

## 2018-01-25 DIAGNOSIS — J449 Chronic obstructive pulmonary disease, unspecified: Secondary | ICD-10-CM | POA: Diagnosis not present

## 2018-01-25 DIAGNOSIS — N189 Chronic kidney disease, unspecified: Secondary | ICD-10-CM | POA: Insufficient documentation

## 2018-01-25 LAB — BASIC METABOLIC PANEL
ANION GAP: 13 (ref 5–15)
BUN: 104 mg/dL — ABNORMAL HIGH (ref 6–20)
CALCIUM: 8.8 mg/dL — AB (ref 8.9–10.3)
CHLORIDE: 93 mmol/L — AB (ref 101–111)
CO2: 24 mmol/L (ref 22–32)
Creatinine, Ser: 3 mg/dL — ABNORMAL HIGH (ref 0.61–1.24)
GFR calc non Af Amer: 19 mL/min — ABNORMAL LOW (ref >60)
GFR, EST AFRICAN AMERICAN: 21 mL/min — AB (ref >60)
Glucose, Bld: 205 mg/dL — ABNORMAL HIGH (ref 65–99)
POTASSIUM: 4.1 mmol/L (ref 3.5–5.1)
Sodium: 130 mmol/L — ABNORMAL LOW (ref 135–145)

## 2018-01-25 LAB — CBC WITH DIFFERENTIAL/PLATELET
BASOS PCT: 1 %
Basophils Absolute: 0 10*3/uL (ref 0–0.1)
Eosinophils Absolute: 0 10*3/uL (ref 0–0.7)
Eosinophils Relative: 1 %
HEMATOCRIT: 29.8 % — AB (ref 40.0–52.0)
Hemoglobin: 10.2 g/dL — ABNORMAL LOW (ref 13.0–18.0)
LYMPHS PCT: 7 %
Lymphs Abs: 0.4 10*3/uL — ABNORMAL LOW (ref 1.0–3.6)
MCH: 27.7 pg (ref 26.0–34.0)
MCHC: 34.1 g/dL (ref 32.0–36.0)
MCV: 81.3 fL (ref 80.0–100.0)
MONOS PCT: 11 %
Monocytes Absolute: 0.6 10*3/uL (ref 0.2–1.0)
NEUTROS PCT: 80 %
Neutro Abs: 4.5 10*3/uL (ref 1.4–6.5)
Platelets: 201 10*3/uL (ref 150–440)
RBC: 3.67 MIL/uL — ABNORMAL LOW (ref 4.40–5.90)
RDW: 17.9 % — ABNORMAL HIGH (ref 11.5–14.5)
WBC: 5.5 10*3/uL (ref 3.8–10.6)

## 2018-01-25 LAB — PROTIME-INR
INR: 2.61
PROTHROMBIN TIME: 27.7 s — AB (ref 11.4–15.2)

## 2018-01-25 LAB — MAGNESIUM: Magnesium: 2.3 mg/dL (ref 1.7–2.4)

## 2018-01-28 ENCOUNTER — Other Ambulatory Visit
Admission: RE | Admit: 2018-01-28 | Discharge: 2018-01-28 | Disposition: A | Discharge status: Home / Self Care | Payer: Medicare Other | Source: Ambulatory Visit | Attending: Internal Medicine | Admitting: Internal Medicine

## 2018-01-28 DIAGNOSIS — N183 Chronic kidney disease, stage 3 (moderate): Secondary | ICD-10-CM | POA: Diagnosis not present

## 2018-01-28 DIAGNOSIS — I130 Hypertensive heart and chronic kidney disease with heart failure and stage 1 through stage 4 chronic kidney disease, or unspecified chronic kidney disease: Secondary | ICD-10-CM | POA: Diagnosis not present

## 2018-01-28 DIAGNOSIS — I482 Chronic atrial fibrillation: Secondary | ICD-10-CM | POA: Diagnosis not present

## 2018-01-28 DIAGNOSIS — J449 Chronic obstructive pulmonary disease, unspecified: Secondary | ICD-10-CM | POA: Diagnosis not present

## 2018-01-28 DIAGNOSIS — I5022 Chronic systolic (congestive) heart failure: Secondary | ICD-10-CM | POA: Insufficient documentation

## 2018-01-28 DIAGNOSIS — E1122 Type 2 diabetes mellitus with diabetic chronic kidney disease: Secondary | ICD-10-CM | POA: Diagnosis not present

## 2018-01-28 LAB — BASIC METABOLIC PANEL
Anion gap: 16 — ABNORMAL HIGH (ref 5–15)
BUN: 118 mg/dL — AB (ref 6–20)
CALCIUM: 9.1 mg/dL (ref 8.9–10.3)
CO2: 22 mmol/L (ref 22–32)
CREATININE: 3.28 mg/dL — AB (ref 0.61–1.24)
Chloride: 94 mmol/L — ABNORMAL LOW (ref 101–111)
GFR calc non Af Amer: 17 mL/min — ABNORMAL LOW (ref >60)
GFR, EST AFRICAN AMERICAN: 19 mL/min — AB (ref >60)
GLUCOSE: 266 mg/dL — AB (ref 65–99)
Potassium: 3.9 mmol/L (ref 3.5–5.1)
Sodium: 132 mmol/L — ABNORMAL LOW (ref 135–145)

## 2018-01-28 LAB — CBC WITH DIFFERENTIAL/PLATELET
BASOS PCT: 1 %
Basophils Absolute: 0 10*3/uL (ref 0–0.1)
EOS ABS: 0 10*3/uL (ref 0–0.7)
EOS PCT: 1 %
HCT: 29.7 % — ABNORMAL LOW (ref 40.0–52.0)
HEMOGLOBIN: 9.9 g/dL — AB (ref 13.0–18.0)
LYMPHS ABS: 0.4 10*3/uL — AB (ref 1.0–3.6)
Lymphocytes Relative: 8 %
MCH: 27.4 pg (ref 26.0–34.0)
MCHC: 33.4 g/dL (ref 32.0–36.0)
MCV: 81.8 fL (ref 80.0–100.0)
MONO ABS: 0.7 10*3/uL (ref 0.2–1.0)
MONOS PCT: 13 %
NEUTROS PCT: 77 %
Neutro Abs: 4 10*3/uL (ref 1.4–6.5)
Platelets: 205 10*3/uL (ref 150–440)
RBC: 3.63 MIL/uL — ABNORMAL LOW (ref 4.40–5.90)
RDW: 17.4 % — AB (ref 11.5–14.5)
WBC: 5.2 10*3/uL (ref 3.8–10.6)

## 2018-01-28 LAB — MAGNESIUM: MAGNESIUM: 2 mg/dL (ref 1.7–2.4)

## 2018-02-01 ENCOUNTER — Other Ambulatory Visit
Admission: RE | Admit: 2018-02-01 | Discharge: 2018-02-01 | Disposition: A | Discharge status: Home / Self Care | Payer: Medicare Other | Source: Ambulatory Visit | Attending: Internal Medicine | Admitting: Internal Medicine

## 2018-02-01 DIAGNOSIS — Z452 Encounter for adjustment and management of vascular access device: Secondary | ICD-10-CM | POA: Diagnosis not present

## 2018-02-01 DIAGNOSIS — R5381 Other malaise: Secondary | ICD-10-CM | POA: Diagnosis not present

## 2018-02-01 DIAGNOSIS — N179 Acute kidney failure, unspecified: Secondary | ICD-10-CM | POA: Diagnosis not present

## 2018-02-01 DIAGNOSIS — R918 Other nonspecific abnormal finding of lung field: Secondary | ICD-10-CM | POA: Diagnosis not present

## 2018-02-01 DIAGNOSIS — R0989 Other specified symptoms and signs involving the circulatory and respiratory systems: Secondary | ICD-10-CM | POA: Diagnosis not present

## 2018-02-01 DIAGNOSIS — I509 Heart failure, unspecified: Secondary | ICD-10-CM | POA: Insufficient documentation

## 2018-02-01 DIAGNOSIS — I5084 End stage heart failure: Secondary | ICD-10-CM | POA: Diagnosis present

## 2018-02-01 DIAGNOSIS — Z7901 Long term (current) use of anticoagulants: Secondary | ICD-10-CM | POA: Diagnosis not present

## 2018-02-01 DIAGNOSIS — I4891 Unspecified atrial fibrillation: Secondary | ICD-10-CM | POA: Diagnosis not present

## 2018-02-01 DIAGNOSIS — Z66 Do not resuscitate: Secondary | ICD-10-CM | POA: Diagnosis present

## 2018-02-01 DIAGNOSIS — E7800 Pure hypercholesterolemia, unspecified: Secondary | ICD-10-CM | POA: Diagnosis not present

## 2018-02-01 DIAGNOSIS — I2510 Atherosclerotic heart disease of native coronary artery without angina pectoris: Secondary | ICD-10-CM | POA: Diagnosis present

## 2018-02-01 DIAGNOSIS — R52 Pain, unspecified: Secondary | ICD-10-CM | POA: Diagnosis not present

## 2018-02-01 DIAGNOSIS — I481 Persistent atrial fibrillation: Secondary | ICD-10-CM | POA: Diagnosis present

## 2018-02-01 DIAGNOSIS — L03119 Cellulitis of unspecified part of limb: Secondary | ICD-10-CM | POA: Diagnosis not present

## 2018-02-01 DIAGNOSIS — J811 Chronic pulmonary edema: Secondary | ICD-10-CM | POA: Diagnosis not present

## 2018-02-01 DIAGNOSIS — I517 Cardiomegaly: Secondary | ICD-10-CM | POA: Diagnosis not present

## 2018-02-01 DIAGNOSIS — I5022 Chronic systolic (congestive) heart failure: Secondary | ICD-10-CM | POA: Diagnosis not present

## 2018-02-01 DIAGNOSIS — I5023 Acute on chronic systolic (congestive) heart failure: Secondary | ICD-10-CM | POA: Diagnosis present

## 2018-02-01 DIAGNOSIS — Z7189 Other specified counseling: Secondary | ICD-10-CM | POA: Diagnosis not present

## 2018-02-01 DIAGNOSIS — Z952 Presence of prosthetic heart valve: Secondary | ICD-10-CM | POA: Diagnosis not present

## 2018-02-01 DIAGNOSIS — Z794 Long term (current) use of insulin: Secondary | ICD-10-CM | POA: Diagnosis not present

## 2018-02-01 DIAGNOSIS — E1122 Type 2 diabetes mellitus with diabetic chronic kidney disease: Secondary | ICD-10-CM | POA: Diagnosis present

## 2018-02-01 DIAGNOSIS — L03115 Cellulitis of right lower limb: Secondary | ICD-10-CM | POA: Diagnosis present

## 2018-02-01 DIAGNOSIS — Z87891 Personal history of nicotine dependence: Secondary | ICD-10-CM | POA: Diagnosis not present

## 2018-02-01 DIAGNOSIS — Z951 Presence of aortocoronary bypass graft: Secondary | ICD-10-CM | POA: Diagnosis not present

## 2018-02-01 DIAGNOSIS — I255 Ischemic cardiomyopathy: Secondary | ICD-10-CM | POA: Diagnosis present

## 2018-02-01 DIAGNOSIS — Z8546 Personal history of malignant neoplasm of prostate: Secondary | ICD-10-CM | POA: Diagnosis not present

## 2018-02-01 DIAGNOSIS — E785 Hyperlipidemia, unspecified: Secondary | ICD-10-CM | POA: Diagnosis present

## 2018-02-01 DIAGNOSIS — I472 Ventricular tachycardia: Secondary | ICD-10-CM | POA: Diagnosis not present

## 2018-02-01 DIAGNOSIS — Z9049 Acquired absence of other specified parts of digestive tract: Secondary | ICD-10-CM | POA: Diagnosis not present

## 2018-02-01 DIAGNOSIS — J449 Chronic obstructive pulmonary disease, unspecified: Secondary | ICD-10-CM | POA: Diagnosis present

## 2018-02-01 DIAGNOSIS — Z451 Encounter for adjustment and management of infusion pump: Secondary | ICD-10-CM | POA: Diagnosis not present

## 2018-02-01 DIAGNOSIS — M1000 Idiopathic gout, unspecified site: Secondary | ICD-10-CM | POA: Diagnosis not present

## 2018-02-01 DIAGNOSIS — I130 Hypertensive heart and chronic kidney disease with heart failure and stage 1 through stage 4 chronic kidney disease, or unspecified chronic kidney disease: Secondary | ICD-10-CM | POA: Diagnosis not present

## 2018-02-01 DIAGNOSIS — M6250 Muscle wasting and atrophy, not elsewhere classified, unspecified site: Secondary | ICD-10-CM | POA: Diagnosis not present

## 2018-02-01 DIAGNOSIS — Z79899 Other long term (current) drug therapy: Secondary | ICD-10-CM | POA: Diagnosis not present

## 2018-02-01 DIAGNOSIS — Z953 Presence of xenogenic heart valve: Secondary | ICD-10-CM | POA: Diagnosis not present

## 2018-02-01 DIAGNOSIS — Z9889 Other specified postprocedural states: Secondary | ICD-10-CM | POA: Diagnosis not present

## 2018-02-01 DIAGNOSIS — R0602 Shortness of breath: Secondary | ICD-10-CM | POA: Diagnosis not present

## 2018-02-01 DIAGNOSIS — Z515 Encounter for palliative care: Secondary | ICD-10-CM | POA: Diagnosis not present

## 2018-02-01 DIAGNOSIS — E119 Type 2 diabetes mellitus without complications: Secondary | ICD-10-CM | POA: Diagnosis not present

## 2018-02-01 DIAGNOSIS — E1169 Type 2 diabetes mellitus with other specified complication: Secondary | ICD-10-CM | POA: Diagnosis present

## 2018-02-01 DIAGNOSIS — Z933 Colostomy status: Secondary | ICD-10-CM | POA: Diagnosis not present

## 2018-02-01 DIAGNOSIS — N183 Chronic kidney disease, stage 3 (moderate): Secondary | ICD-10-CM | POA: Diagnosis present

## 2018-02-01 DIAGNOSIS — Z9981 Dependence on supplemental oxygen: Secondary | ICD-10-CM | POA: Diagnosis not present

## 2018-02-01 DIAGNOSIS — I25810 Atherosclerosis of coronary artery bypass graft(s) without angina pectoris: Secondary | ICD-10-CM | POA: Diagnosis not present

## 2018-02-01 DIAGNOSIS — I5020 Unspecified systolic (congestive) heart failure: Secondary | ICD-10-CM | POA: Diagnosis not present

## 2018-02-01 DIAGNOSIS — A0472 Enterocolitis due to Clostridium difficile, not specified as recurrent: Secondary | ICD-10-CM | POA: Diagnosis not present

## 2018-02-01 DIAGNOSIS — I482 Chronic atrial fibrillation: Secondary | ICD-10-CM | POA: Diagnosis present

## 2018-02-01 DIAGNOSIS — I25119 Atherosclerotic heart disease of native coronary artery with unspecified angina pectoris: Secondary | ICD-10-CM | POA: Diagnosis not present

## 2018-02-01 LAB — BASIC METABOLIC PANEL
Anion gap: 13 (ref 5–15)
BUN: 117 mg/dL — ABNORMAL HIGH (ref 6–20)
CHLORIDE: 90 mmol/L — AB (ref 101–111)
CO2: 24 mmol/L (ref 22–32)
CREATININE: 2.82 mg/dL — AB (ref 0.61–1.24)
Calcium: 8.5 mg/dL — ABNORMAL LOW (ref 8.9–10.3)
GFR, EST AFRICAN AMERICAN: 23 mL/min — AB (ref >60)
GFR, EST NON AFRICAN AMERICAN: 20 mL/min — AB (ref >60)
Glucose, Bld: 305 mg/dL — ABNORMAL HIGH (ref 65–99)
POTASSIUM: 3.9 mmol/L (ref 3.5–5.1)
SODIUM: 127 mmol/L — AB (ref 135–145)

## 2018-02-01 LAB — MAGNESIUM: MAGNESIUM: 1.7 mg/dL (ref 1.7–2.4)

## 2018-02-01 LAB — CBC WITH DIFFERENTIAL/PLATELET
BASOS ABS: 0 10*3/uL (ref 0–0.1)
Basophils Relative: 1 %
EOS ABS: 0.1 10*3/uL (ref 0–0.7)
Eosinophils Relative: 1 %
HCT: 29.6 % — ABNORMAL LOW (ref 40.0–52.0)
HEMOGLOBIN: 9.8 g/dL — AB (ref 13.0–18.0)
LYMPHS ABS: 0.4 10*3/uL — AB (ref 1.0–3.6)
LYMPHS PCT: 9 %
MCH: 26.9 pg (ref 26.0–34.0)
MCHC: 33.3 g/dL (ref 32.0–36.0)
MCV: 81 fL (ref 80.0–100.0)
Monocytes Absolute: 0.5 10*3/uL (ref 0.2–1.0)
Monocytes Relative: 11 %
NEUTROS PCT: 78 %
Neutro Abs: 3.6 10*3/uL (ref 1.4–6.5)
PLATELETS: 199 10*3/uL (ref 150–440)
RBC: 3.65 MIL/uL — AB (ref 4.40–5.90)
RDW: 17.6 % — ABNORMAL HIGH (ref 11.5–14.5)
WBC: 4.6 10*3/uL (ref 3.8–10.6)

## 2018-02-01 LAB — PROTIME-INR
INR: 2.42
Prothrombin Time: 26.1 seconds — ABNORMAL HIGH (ref 11.4–15.2)

## 2018-02-21 DIAGNOSIS — I482 Chronic atrial fibrillation: Secondary | ICD-10-CM | POA: Diagnosis not present

## 2018-02-21 DIAGNOSIS — Z7901 Long term (current) use of anticoagulants: Secondary | ICD-10-CM | POA: Diagnosis not present

## 2018-02-21 DIAGNOSIS — Z952 Presence of prosthetic heart valve: Secondary | ICD-10-CM | POA: Diagnosis not present

## 2018-03-22 DEATH — deceased
